# Patient Record
Sex: Male | Born: 1935 | Race: White | Hispanic: No | Smoking: Former smoker
Health system: Southern US, Community
[De-identification: ages and names within clinical notes are randomized; demographics above are authoritative.]

## PROBLEM LIST (undated history)

## (undated) DIAGNOSIS — R413 Other amnesia: Secondary | ICD-10-CM

## (undated) DIAGNOSIS — E785 Hyperlipidemia, unspecified: Secondary | ICD-10-CM

## (undated) DIAGNOSIS — F0390 Unspecified dementia without behavioral disturbance: Secondary | ICD-10-CM

## (undated) DIAGNOSIS — Z8679 Personal history of other diseases of the circulatory system: Secondary | ICD-10-CM

## (undated) DIAGNOSIS — G709 Myoneural disorder, unspecified: Secondary | ICD-10-CM

## (undated) DIAGNOSIS — I10 Essential (primary) hypertension: Secondary | ICD-10-CM

## (undated) DIAGNOSIS — H269 Unspecified cataract: Secondary | ICD-10-CM

## (undated) DIAGNOSIS — K626 Ulcer of anus and rectum: Secondary | ICD-10-CM

## (undated) DIAGNOSIS — N529 Male erectile dysfunction, unspecified: Secondary | ICD-10-CM

## (undated) DIAGNOSIS — C7951 Secondary malignant neoplasm of bone: Secondary | ICD-10-CM

## (undated) DIAGNOSIS — K529 Noninfective gastroenteritis and colitis, unspecified: Secondary | ICD-10-CM

## (undated) DIAGNOSIS — Z923 Personal history of irradiation: Secondary | ICD-10-CM

## (undated) DIAGNOSIS — I2510 Atherosclerotic heart disease of native coronary artery without angina pectoris: Secondary | ICD-10-CM

## (undated) DIAGNOSIS — K219 Gastro-esophageal reflux disease without esophagitis: Secondary | ICD-10-CM

## (undated) DIAGNOSIS — K589 Irritable bowel syndrome without diarrhea: Secondary | ICD-10-CM

## (undated) DIAGNOSIS — I219 Acute myocardial infarction, unspecified: Secondary | ICD-10-CM

## (undated) DIAGNOSIS — C61 Malignant neoplasm of prostate: Secondary | ICD-10-CM

## (undated) DIAGNOSIS — E039 Hypothyroidism, unspecified: Secondary | ICD-10-CM

## (undated) DIAGNOSIS — R52 Pain, unspecified: Secondary | ICD-10-CM

## (undated) DIAGNOSIS — F329 Major depressive disorder, single episode, unspecified: Secondary | ICD-10-CM

## (undated) DIAGNOSIS — D214 Benign neoplasm of connective and other soft tissue of abdomen: Principal | ICD-10-CM

## (undated) DIAGNOSIS — N289 Disorder of kidney and ureter, unspecified: Secondary | ICD-10-CM

## (undated) DIAGNOSIS — F32A Depression, unspecified: Secondary | ICD-10-CM

## (undated) HISTORY — DX: Benign neoplasm of connective and other soft tissue of abdomen: D21.4

## (undated) HISTORY — DX: Hyperlipidemia, unspecified: E78.5

## (undated) HISTORY — DX: Hypothyroidism, unspecified: E03.9

## (undated) HISTORY — PX: UPPER GASTROINTESTINAL ENDOSCOPY: SHX188

## (undated) HISTORY — DX: Unspecified cataract: H26.9

## (undated) HISTORY — DX: Noninfective gastroenteritis and colitis, unspecified: K52.9

## (undated) HISTORY — DX: Acute myocardial infarction, unspecified: I21.9

## (undated) HISTORY — DX: Ulcer of anus and rectum: K62.6

## (undated) HISTORY — PX: COLONOSCOPY: SHX174

## (undated) HISTORY — DX: Male erectile dysfunction, unspecified: N52.9

## (undated) HISTORY — DX: Essential (primary) hypertension: I10

## (undated) HISTORY — DX: Malignant neoplasm of prostate: C61

## (undated) HISTORY — DX: Myoneural disorder, unspecified: G70.9

## (undated) HISTORY — DX: Other amnesia: R41.3

## (undated) HISTORY — DX: Irritable bowel syndrome, unspecified: K58.9

## (undated) HISTORY — DX: Personal history of other diseases of the circulatory system: Z86.79

## (undated) HISTORY — PX: STOMACH SURGERY: SHX791

---

## 1996-07-01 DIAGNOSIS — Z8679 Personal history of other diseases of the circulatory system: Secondary | ICD-10-CM

## 1996-07-01 HISTORY — DX: Personal history of other diseases of the circulatory system: Z86.79

## 1996-07-01 HISTORY — PX: CORONARY ARTERY BYPASS GRAFT: SHX141

## 1997-09-29 ENCOUNTER — Encounter (HOSPITAL_COMMUNITY): Admission: RE | Admit: 1997-09-29 | Discharge: 1997-12-28 | Payer: Self-pay | Admitting: Cardiology

## 1997-12-30 ENCOUNTER — Encounter (HOSPITAL_COMMUNITY): Admission: RE | Admit: 1997-12-30 | Discharge: 1998-03-30 | Payer: Self-pay | Admitting: Cardiology

## 1998-03-31 ENCOUNTER — Encounter (HOSPITAL_COMMUNITY): Admission: RE | Admit: 1998-03-31 | Discharge: 1998-06-29 | Payer: Self-pay | Admitting: Cardiology

## 1998-06-30 ENCOUNTER — Encounter (HOSPITAL_COMMUNITY): Admission: RE | Admit: 1998-06-30 | Discharge: 1998-09-28 | Payer: Self-pay | Admitting: Cardiology

## 1998-09-29 ENCOUNTER — Encounter (HOSPITAL_COMMUNITY): Admission: RE | Admit: 1998-09-29 | Discharge: 1998-12-28 | Payer: Self-pay | Admitting: Cardiology

## 1998-12-29 ENCOUNTER — Encounter (HOSPITAL_COMMUNITY): Admission: RE | Admit: 1998-12-29 | Discharge: 1999-03-29 | Payer: Self-pay | Admitting: Cardiology

## 2000-03-11 ENCOUNTER — Encounter (INDEPENDENT_AMBULATORY_CARE_PROVIDER_SITE_OTHER): Payer: Self-pay | Admitting: *Deleted

## 2000-03-11 ENCOUNTER — Ambulatory Visit (HOSPITAL_BASED_OUTPATIENT_CLINIC_OR_DEPARTMENT_OTHER): Admission: RE | Admit: 2000-03-11 | Discharge: 2000-03-11 | Payer: Self-pay | Admitting: Plastic Surgery

## 2001-09-04 ENCOUNTER — Encounter: Payer: Self-pay | Admitting: Gastroenterology

## 2002-11-04 ENCOUNTER — Emergency Department (HOSPITAL_COMMUNITY): Admission: EM | Admit: 2002-11-04 | Discharge: 2002-11-04 | Payer: Self-pay | Admitting: Emergency Medicine

## 2002-11-04 ENCOUNTER — Encounter: Payer: Self-pay | Admitting: Emergency Medicine

## 2004-06-04 ENCOUNTER — Ambulatory Visit: Payer: Self-pay | Admitting: Internal Medicine

## 2004-11-07 ENCOUNTER — Ambulatory Visit: Payer: Self-pay | Admitting: Internal Medicine

## 2005-01-03 ENCOUNTER — Ambulatory Visit: Payer: Self-pay | Admitting: Internal Medicine

## 2005-01-18 ENCOUNTER — Ambulatory Visit: Payer: Self-pay | Admitting: Internal Medicine

## 2005-01-22 ENCOUNTER — Ambulatory Visit: Admission: RE | Admit: 2005-01-22 | Discharge: 2005-02-05 | Payer: Self-pay | Admitting: Radiation Oncology

## 2005-02-04 ENCOUNTER — Ambulatory Visit: Payer: Self-pay | Admitting: Internal Medicine

## 2005-02-22 ENCOUNTER — Ambulatory Visit: Payer: Self-pay | Admitting: Gastroenterology

## 2005-03-05 ENCOUNTER — Ambulatory Visit: Payer: Self-pay | Admitting: Gastroenterology

## 2005-03-05 ENCOUNTER — Ambulatory Visit: Payer: Self-pay | Admitting: Internal Medicine

## 2005-04-15 ENCOUNTER — Ambulatory Visit: Admission: RE | Admit: 2005-04-15 | Discharge: 2005-05-06 | Payer: Self-pay | Admitting: Radiation Oncology

## 2005-05-29 ENCOUNTER — Ambulatory Visit: Admission: RE | Admit: 2005-05-29 | Discharge: 2005-06-21 | Payer: Self-pay | Admitting: Radiation Oncology

## 2005-06-20 ENCOUNTER — Ambulatory Visit: Payer: Self-pay | Admitting: Internal Medicine

## 2005-06-27 ENCOUNTER — Ambulatory Visit: Payer: Self-pay | Admitting: Internal Medicine

## 2005-07-01 DIAGNOSIS — C61 Malignant neoplasm of prostate: Secondary | ICD-10-CM

## 2005-07-01 HISTORY — DX: Malignant neoplasm of prostate: C61

## 2005-07-02 ENCOUNTER — Ambulatory Visit: Admission: RE | Admit: 2005-07-02 | Discharge: 2005-09-20 | Payer: Self-pay | Admitting: Radiation Oncology

## 2005-07-02 ENCOUNTER — Emergency Department (HOSPITAL_COMMUNITY): Admission: EM | Admit: 2005-07-02 | Discharge: 2005-07-02 | Payer: Self-pay | Admitting: Emergency Medicine

## 2005-08-30 ENCOUNTER — Ambulatory Visit: Payer: Self-pay | Admitting: Internal Medicine

## 2005-09-24 ENCOUNTER — Ambulatory Visit: Payer: Self-pay | Admitting: Internal Medicine

## 2005-10-11 ENCOUNTER — Ambulatory Visit: Payer: Self-pay | Admitting: Internal Medicine

## 2005-11-29 ENCOUNTER — Ambulatory Visit: Payer: Self-pay | Admitting: Internal Medicine

## 2006-01-07 ENCOUNTER — Ambulatory Visit: Payer: Self-pay | Admitting: Internal Medicine

## 2006-02-27 ENCOUNTER — Ambulatory Visit: Payer: Self-pay | Admitting: Internal Medicine

## 2006-03-25 ENCOUNTER — Ambulatory Visit: Payer: Self-pay | Admitting: Internal Medicine

## 2006-05-15 ENCOUNTER — Ambulatory Visit: Payer: Self-pay | Admitting: Internal Medicine

## 2006-07-04 ENCOUNTER — Ambulatory Visit: Payer: Self-pay | Admitting: Internal Medicine

## 2006-09-15 ENCOUNTER — Ambulatory Visit: Payer: Self-pay | Admitting: Internal Medicine

## 2006-10-08 ENCOUNTER — Ambulatory Visit: Payer: Self-pay | Admitting: Internal Medicine

## 2006-10-08 LAB — CONVERTED CEMR LAB
AST: 18 units/L (ref 0–37)
Bilirubin, Direct: 0.1 mg/dL (ref 0.0–0.3)
Chloride: 108 meq/L (ref 96–112)
Eosinophils Absolute: 0.3 10*3/uL (ref 0.0–0.6)
Eosinophils Relative: 5.1 % — ABNORMAL HIGH (ref 0.0–5.0)
GFR calc non Af Amer: 78 mL/min
Glucose, Bld: 190 mg/dL — ABNORMAL HIGH (ref 70–99)
HCT: 35.5 % — ABNORMAL LOW (ref 39.0–52.0)
Hemoglobin: 12.7 g/dL — ABNORMAL LOW (ref 13.0–17.0)
Hgb A1c MFr Bld: 5.8 % (ref 4.6–6.0)
Lymphocytes Relative: 19.8 % (ref 12.0–46.0)
MCHC: 35.8 g/dL (ref 30.0–36.0)
MCV: 95.3 fL (ref 78.0–100.0)
Monocytes Absolute: 0.4 10*3/uL (ref 0.2–0.7)
Neutro Abs: 3.4 10*3/uL (ref 1.4–7.7)
Neutrophils Relative %: 66.2 % (ref 43.0–77.0)
Potassium: 3.6 meq/L (ref 3.5–5.1)
RBC: 3.72 M/uL — ABNORMAL LOW (ref 4.22–5.81)
Sodium: 140 meq/L (ref 135–145)
TSH: 1.86 microintl units/mL (ref 0.35–5.50)
WBC: 5.1 10*3/uL (ref 4.5–10.5)

## 2006-10-23 ENCOUNTER — Ambulatory Visit: Payer: Self-pay | Admitting: Internal Medicine

## 2006-11-11 ENCOUNTER — Ambulatory Visit: Payer: Self-pay | Admitting: Internal Medicine

## 2006-12-25 ENCOUNTER — Ambulatory Visit: Payer: Self-pay | Admitting: Internal Medicine

## 2006-12-29 ENCOUNTER — Encounter: Payer: Self-pay | Admitting: Internal Medicine

## 2006-12-29 DIAGNOSIS — K5730 Diverticulosis of large intestine without perforation or abscess without bleeding: Secondary | ICD-10-CM | POA: Insufficient documentation

## 2006-12-29 DIAGNOSIS — I10 Essential (primary) hypertension: Secondary | ICD-10-CM | POA: Insufficient documentation

## 2006-12-29 DIAGNOSIS — E0842 Diabetes mellitus due to underlying condition with diabetic polyneuropathy: Secondary | ICD-10-CM

## 2006-12-29 DIAGNOSIS — E785 Hyperlipidemia, unspecified: Secondary | ICD-10-CM | POA: Insufficient documentation

## 2006-12-29 DIAGNOSIS — Z8546 Personal history of malignant neoplasm of prostate: Secondary | ICD-10-CM

## 2006-12-29 DIAGNOSIS — I2510 Atherosclerotic heart disease of native coronary artery without angina pectoris: Secondary | ICD-10-CM | POA: Insufficient documentation

## 2007-02-02 ENCOUNTER — Encounter (INDEPENDENT_AMBULATORY_CARE_PROVIDER_SITE_OTHER): Payer: Self-pay

## 2007-02-03 ENCOUNTER — Telehealth: Payer: Self-pay | Admitting: Internal Medicine

## 2007-02-04 ENCOUNTER — Ambulatory Visit: Payer: Self-pay | Admitting: Internal Medicine

## 2007-02-04 DIAGNOSIS — D6489 Other specified anemias: Secondary | ICD-10-CM

## 2007-02-04 DIAGNOSIS — R5383 Other fatigue: Secondary | ICD-10-CM

## 2007-02-04 DIAGNOSIS — M549 Dorsalgia, unspecified: Secondary | ICD-10-CM | POA: Insufficient documentation

## 2007-02-04 DIAGNOSIS — R5381 Other malaise: Secondary | ICD-10-CM | POA: Insufficient documentation

## 2007-02-04 LAB — CONVERTED CEMR LAB
ALT: 28 units/L (ref 0–53)
AST: 24 units/L (ref 0–37)
Albumin: 3.8 g/dL (ref 3.5–5.2)
Alkaline Phosphatase: 32 units/L — ABNORMAL LOW (ref 39–117)
BUN: 17 mg/dL (ref 6–23)
Basophils Absolute: 0 10*3/uL (ref 0.0–0.1)
Chloride: 106 meq/L (ref 96–112)
Creatinine, Ser: 1.2 mg/dL (ref 0.4–1.5)
HCT: 34.3 % — ABNORMAL LOW (ref 39.0–52.0)
MCHC: 35.6 g/dL (ref 30.0–36.0)
Monocytes Relative: 8.7 % (ref 3.0–11.0)
RBC: 3.63 M/uL — ABNORMAL LOW (ref 4.22–5.81)
RDW: 13.6 % (ref 11.5–14.6)
Total Bilirubin: 0.8 mg/dL (ref 0.3–1.2)

## 2007-03-23 ENCOUNTER — Ambulatory Visit: Payer: Self-pay | Admitting: Internal Medicine

## 2007-03-24 ENCOUNTER — Encounter: Payer: Self-pay | Admitting: Internal Medicine

## 2007-04-30 ENCOUNTER — Telehealth: Payer: Self-pay | Admitting: Internal Medicine

## 2007-05-07 ENCOUNTER — Ambulatory Visit: Payer: Self-pay | Admitting: Internal Medicine

## 2007-05-22 ENCOUNTER — Ambulatory Visit: Payer: Self-pay | Admitting: Internal Medicine

## 2007-06-22 ENCOUNTER — Ambulatory Visit: Payer: Self-pay | Admitting: Internal Medicine

## 2007-07-02 DIAGNOSIS — I219 Acute myocardial infarction, unspecified: Secondary | ICD-10-CM

## 2007-07-02 HISTORY — PX: OTHER SURGICAL HISTORY: SHX169

## 2007-07-02 HISTORY — DX: Acute myocardial infarction, unspecified: I21.9

## 2007-08-14 ENCOUNTER — Inpatient Hospital Stay (HOSPITAL_COMMUNITY): Admission: EM | Admit: 2007-08-14 | Discharge: 2007-08-16 | Payer: Self-pay | Admitting: Emergency Medicine

## 2007-08-14 ENCOUNTER — Encounter: Payer: Self-pay | Admitting: Orthopedic Surgery

## 2007-08-31 ENCOUNTER — Encounter: Payer: Self-pay | Admitting: Internal Medicine

## 2007-09-03 ENCOUNTER — Encounter (HOSPITAL_COMMUNITY): Admission: RE | Admit: 2007-09-03 | Discharge: 2007-12-02 | Payer: Self-pay | Admitting: Cardiology

## 2007-09-24 ENCOUNTER — Ambulatory Visit: Payer: Self-pay | Admitting: Internal Medicine

## 2007-10-08 ENCOUNTER — Encounter: Payer: Self-pay | Admitting: Internal Medicine

## 2007-10-16 ENCOUNTER — Encounter: Payer: Self-pay | Admitting: Internal Medicine

## 2007-11-03 ENCOUNTER — Inpatient Hospital Stay (HOSPITAL_COMMUNITY): Admission: AD | Admit: 2007-11-03 | Discharge: 2007-11-04 | Payer: Self-pay | Admitting: Cardiology

## 2007-11-03 ENCOUNTER — Inpatient Hospital Stay (HOSPITAL_BASED_OUTPATIENT_CLINIC_OR_DEPARTMENT_OTHER): Admission: RE | Admit: 2007-11-03 | Discharge: 2007-11-03 | Payer: Self-pay | Admitting: Cardiology

## 2007-11-13 ENCOUNTER — Encounter: Admission: RE | Admit: 2007-11-13 | Discharge: 2007-11-13 | Payer: Self-pay | Admitting: Cardiology

## 2007-11-13 HISTORY — PX: CORONARY STENT PLACEMENT: SHX1402

## 2007-11-26 ENCOUNTER — Ambulatory Visit: Payer: Self-pay | Admitting: Internal Medicine

## 2007-11-30 ENCOUNTER — Encounter: Payer: Self-pay | Admitting: Internal Medicine

## 2007-12-02 ENCOUNTER — Encounter: Payer: Self-pay | Admitting: Internal Medicine

## 2007-12-02 ENCOUNTER — Telehealth: Payer: Self-pay | Admitting: Internal Medicine

## 2007-12-03 ENCOUNTER — Encounter (HOSPITAL_COMMUNITY): Admission: RE | Admit: 2007-12-03 | Discharge: 2008-01-30 | Payer: Self-pay | Admitting: Cardiology

## 2007-12-04 ENCOUNTER — Telehealth (INDEPENDENT_AMBULATORY_CARE_PROVIDER_SITE_OTHER): Payer: Self-pay

## 2007-12-14 DIAGNOSIS — K648 Other hemorrhoids: Secondary | ICD-10-CM | POA: Insufficient documentation

## 2007-12-15 ENCOUNTER — Ambulatory Visit: Payer: Self-pay | Admitting: Gastroenterology

## 2007-12-15 DIAGNOSIS — K219 Gastro-esophageal reflux disease without esophagitis: Secondary | ICD-10-CM | POA: Insufficient documentation

## 2007-12-15 DIAGNOSIS — D509 Iron deficiency anemia, unspecified: Secondary | ICD-10-CM | POA: Insufficient documentation

## 2007-12-16 ENCOUNTER — Encounter: Payer: Self-pay | Admitting: Internal Medicine

## 2007-12-16 LAB — CONVERTED CEMR LAB
Basophils Relative: 1.1 % — ABNORMAL HIGH (ref 0.0–1.0)
Eosinophils Absolute: 0.3 10*3/uL (ref 0.0–0.7)
Eosinophils Relative: 5.9 % — ABNORMAL HIGH (ref 0.0–5.0)
Folate: >20 ng/mL
HCT: 36.5 % — ABNORMAL LOW (ref 39.0–52.0)
MCV: 98.2 fL (ref 78.0–100.0)
Monocytes Absolute: 0.4 10*3/uL (ref 0.1–1.0)
Neutrophils Relative %: 66.9 % (ref 43.0–77.0)
RBC: 3.72 M/uL — ABNORMAL LOW (ref 4.22–5.81)
Saturation Ratios: 34.6 % (ref 20.0–50.0)
WBC: 4.7 10*3/uL (ref 4.5–10.5)

## 2007-12-25 ENCOUNTER — Encounter: Payer: Self-pay | Admitting: Internal Medicine

## 2007-12-29 ENCOUNTER — Ambulatory Visit: Payer: Self-pay | Admitting: Gastroenterology

## 2007-12-29 LAB — CONVERTED CEMR LAB
OCCULT 1: NEGATIVE
OCCULT 4: NEGATIVE

## 2008-01-01 ENCOUNTER — Encounter: Payer: Self-pay | Admitting: Internal Medicine

## 2008-01-07 ENCOUNTER — Encounter: Admission: RE | Admit: 2008-01-07 | Discharge: 2008-01-07 | Payer: Self-pay | Admitting: Surgery

## 2008-01-07 ENCOUNTER — Other Ambulatory Visit: Admission: RE | Admit: 2008-01-07 | Discharge: 2008-01-07 | Payer: Self-pay | Admitting: Diagnostic Radiology

## 2008-01-07 ENCOUNTER — Encounter (INDEPENDENT_AMBULATORY_CARE_PROVIDER_SITE_OTHER): Payer: Self-pay | Admitting: Diagnostic Radiology

## 2008-01-07 ENCOUNTER — Encounter: Payer: Self-pay | Admitting: Internal Medicine

## 2008-01-14 ENCOUNTER — Ambulatory Visit: Payer: Self-pay | Admitting: Internal Medicine

## 2008-01-14 DIAGNOSIS — R252 Cramp and spasm: Secondary | ICD-10-CM

## 2008-02-08 ENCOUNTER — Ambulatory Visit: Payer: Self-pay | Admitting: Gastroenterology

## 2008-02-26 ENCOUNTER — Ambulatory Visit: Payer: Self-pay | Admitting: Internal Medicine

## 2008-03-31 ENCOUNTER — Ambulatory Visit: Payer: Self-pay | Admitting: Internal Medicine

## 2008-05-06 ENCOUNTER — Telehealth: Payer: Self-pay | Admitting: Gastroenterology

## 2008-05-12 ENCOUNTER — Telehealth: Payer: Self-pay | Admitting: Internal Medicine

## 2008-05-12 ENCOUNTER — Telehealth: Payer: Self-pay | Admitting: Gastroenterology

## 2008-06-07 ENCOUNTER — Ambulatory Visit: Payer: Self-pay | Admitting: Internal Medicine

## 2008-06-07 LAB — CONVERTED CEMR LAB
Basophils Absolute: 0.1 10*3/uL (ref 0.0–0.1)
Basophils Relative: 0.7 % (ref 0.0–3.0)
Calcium: 9.8 mg/dL (ref 8.4–10.5)
Creatinine, Ser: 1.4 mg/dL (ref 0.4–1.5)
Eosinophils Absolute: 0.3 10*3/uL (ref 0.0–0.7)
GFR calc Af Amer: 64 mL/min
GFR calc non Af Amer: 53 mL/min
HCT: 35.8 % — ABNORMAL LOW (ref 39.0–52.0)
Hgb A1c MFr Bld: 6.5 % — ABNORMAL HIGH (ref 4.6–6.0)
MCHC: 34.2 g/dL (ref 30.0–36.0)
MCV: 93.9 fL (ref 78.0–100.0)
Monocytes Absolute: 0.5 10*3/uL (ref 0.1–1.0)
Neutro Abs: 5 10*3/uL (ref 1.4–7.7)
Neutrophils Relative %: 67 % (ref 43.0–77.0)
RBC: 3.81 M/uL — ABNORMAL LOW (ref 4.22–5.81)
TSH: 0.82 microintl units/mL (ref 0.35–5.50)
Total Bilirubin: 0.6 mg/dL (ref 0.3–1.2)

## 2008-06-10 ENCOUNTER — Telehealth: Payer: Self-pay | Admitting: Internal Medicine

## 2008-07-05 ENCOUNTER — Ambulatory Visit: Payer: Self-pay | Admitting: Internal Medicine

## 2008-08-04 ENCOUNTER — Ambulatory Visit: Payer: Self-pay | Admitting: Internal Medicine

## 2008-08-15 ENCOUNTER — Encounter: Payer: Self-pay | Admitting: Internal Medicine

## 2008-08-29 ENCOUNTER — Encounter: Admission: RE | Admit: 2008-08-29 | Discharge: 2008-08-29 | Payer: Self-pay | Admitting: Surgery

## 2008-09-08 ENCOUNTER — Encounter: Payer: Self-pay | Admitting: Internal Medicine

## 2008-10-11 ENCOUNTER — Ambulatory Visit: Payer: Self-pay | Admitting: Internal Medicine

## 2008-11-11 ENCOUNTER — Encounter: Payer: Self-pay | Admitting: Internal Medicine

## 2009-01-10 ENCOUNTER — Ambulatory Visit: Payer: Self-pay | Admitting: Internal Medicine

## 2009-01-10 DIAGNOSIS — E1149 Type 2 diabetes mellitus with other diabetic neurological complication: Secondary | ICD-10-CM

## 2009-01-10 LAB — CONVERTED CEMR LAB: Hgb A1c MFr Bld: 6.6 % — ABNORMAL HIGH (ref 4.6–6.5)

## 2009-03-01 ENCOUNTER — Encounter: Payer: Self-pay | Admitting: Internal Medicine

## 2009-04-03 ENCOUNTER — Encounter: Payer: Self-pay | Admitting: Internal Medicine

## 2009-04-13 ENCOUNTER — Ambulatory Visit: Payer: Self-pay | Admitting: Internal Medicine

## 2009-04-14 LAB — CONVERTED CEMR LAB: Hgb A1c MFr Bld: 6.1 % (ref 4.6–6.5)

## 2009-05-03 ENCOUNTER — Encounter (INDEPENDENT_AMBULATORY_CARE_PROVIDER_SITE_OTHER): Payer: Self-pay | Admitting: *Deleted

## 2009-05-17 ENCOUNTER — Telehealth: Payer: Self-pay | Admitting: Internal Medicine

## 2009-06-06 ENCOUNTER — Ambulatory Visit: Payer: Self-pay | Admitting: Internal Medicine

## 2009-06-06 ENCOUNTER — Ambulatory Visit: Payer: Self-pay | Admitting: Gastroenterology

## 2009-06-06 LAB — CONVERTED CEMR LAB
IgA: 215 mg/dL (ref 68–378)
Saturation Ratios: 21.4 % (ref 20.0–50.0)
Transferrin: 293.6 mg/dL (ref 212.0–360.0)

## 2009-06-20 ENCOUNTER — Ambulatory Visit: Payer: Self-pay | Admitting: Internal Medicine

## 2009-06-27 ENCOUNTER — Telehealth: Payer: Self-pay | Admitting: Gastroenterology

## 2009-07-04 ENCOUNTER — Ambulatory Visit: Payer: Self-pay | Admitting: Internal Medicine

## 2009-07-04 LAB — CONVERTED CEMR LAB: Blood Glucose, Fingerstick: 284

## 2009-07-13 ENCOUNTER — Ambulatory Visit: Payer: Self-pay | Admitting: Internal Medicine

## 2009-07-13 LAB — CONVERTED CEMR LAB: Blood Glucose, Fingerstick: 336

## 2009-07-17 ENCOUNTER — Telehealth: Payer: Self-pay | Admitting: Gastroenterology

## 2009-07-18 ENCOUNTER — Ambulatory Visit: Payer: Self-pay | Admitting: Gastroenterology

## 2009-07-18 DIAGNOSIS — E039 Hypothyroidism, unspecified: Secondary | ICD-10-CM

## 2009-07-19 ENCOUNTER — Telehealth: Payer: Self-pay | Admitting: Gastroenterology

## 2009-07-24 ENCOUNTER — Ambulatory Visit: Payer: Self-pay | Admitting: Gastroenterology

## 2009-07-26 ENCOUNTER — Encounter: Payer: Self-pay | Admitting: Gastroenterology

## 2009-08-07 ENCOUNTER — Encounter: Payer: Self-pay | Admitting: Internal Medicine

## 2009-08-10 ENCOUNTER — Ambulatory Visit: Payer: Self-pay | Admitting: Gastroenterology

## 2009-08-18 ENCOUNTER — Telehealth: Payer: Self-pay | Admitting: Gastroenterology

## 2009-08-22 ENCOUNTER — Telehealth: Payer: Self-pay | Admitting: Gastroenterology

## 2009-09-13 ENCOUNTER — Telehealth: Payer: Self-pay | Admitting: Gastroenterology

## 2009-09-14 ENCOUNTER — Encounter: Payer: Self-pay | Admitting: Internal Medicine

## 2009-09-18 ENCOUNTER — Ambulatory Visit: Payer: Self-pay | Admitting: Gastroenterology

## 2009-09-19 ENCOUNTER — Ambulatory Visit: Payer: Self-pay | Admitting: Internal Medicine

## 2009-09-21 ENCOUNTER — Encounter: Payer: Self-pay | Admitting: Internal Medicine

## 2009-09-25 ENCOUNTER — Telehealth: Payer: Self-pay

## 2009-09-25 ENCOUNTER — Encounter: Payer: Self-pay | Admitting: Internal Medicine

## 2009-09-27 ENCOUNTER — Telehealth: Payer: Self-pay | Admitting: Gastroenterology

## 2009-10-02 ENCOUNTER — Telehealth: Payer: Self-pay | Admitting: Gastroenterology

## 2009-10-04 ENCOUNTER — Encounter: Payer: Self-pay | Admitting: Gastroenterology

## 2009-10-17 ENCOUNTER — Ambulatory Visit: Payer: Self-pay | Admitting: Internal Medicine

## 2009-10-24 ENCOUNTER — Encounter: Payer: Self-pay | Admitting: Internal Medicine

## 2009-11-06 ENCOUNTER — Encounter: Payer: Self-pay | Admitting: Internal Medicine

## 2009-11-07 ENCOUNTER — Telehealth (INDEPENDENT_AMBULATORY_CARE_PROVIDER_SITE_OTHER): Payer: Self-pay | Admitting: *Deleted

## 2009-11-14 ENCOUNTER — Encounter: Payer: Self-pay | Admitting: Internal Medicine

## 2009-12-15 ENCOUNTER — Telehealth: Payer: Self-pay | Admitting: Gastroenterology

## 2009-12-22 ENCOUNTER — Telehealth: Payer: Self-pay | Admitting: Internal Medicine

## 2009-12-26 ENCOUNTER — Ambulatory Visit: Payer: Self-pay | Admitting: Gastroenterology

## 2009-12-26 DIAGNOSIS — K594 Anal spasm: Secondary | ICD-10-CM

## 2009-12-27 ENCOUNTER — Encounter: Payer: Self-pay | Admitting: Gastroenterology

## 2010-01-02 ENCOUNTER — Telehealth: Payer: Self-pay | Admitting: Gastroenterology

## 2010-01-18 ENCOUNTER — Ambulatory Visit: Payer: Self-pay | Admitting: Internal Medicine

## 2010-01-29 ENCOUNTER — Telehealth: Payer: Self-pay | Admitting: *Deleted

## 2010-01-30 ENCOUNTER — Telehealth: Payer: Self-pay | Admitting: Gastroenterology

## 2010-02-02 ENCOUNTER — Telehealth: Payer: Self-pay | Admitting: Gastroenterology

## 2010-02-05 ENCOUNTER — Encounter: Payer: Self-pay | Admitting: Internal Medicine

## 2010-02-16 ENCOUNTER — Ambulatory Visit: Payer: Self-pay | Admitting: Internal Medicine

## 2010-02-23 ENCOUNTER — Telehealth: Payer: Self-pay | Admitting: Internal Medicine

## 2010-03-02 ENCOUNTER — Ambulatory Visit: Payer: Self-pay | Admitting: Gastroenterology

## 2010-03-02 LAB — CONVERTED CEMR LAB
CRP, High Sensitivity: 1.44 (ref 0.00–5.00)
Folate: >20 ng/mL
Iron: 115 ug/dL (ref 42–165)
Magnesium: 2.1 mg/dL (ref 1.5–2.5)
Transferrin: 261.5 mg/dL (ref 212.0–360.0)
Vitamin B-12: 284 pg/mL (ref 211–911)

## 2010-03-22 ENCOUNTER — Ambulatory Visit: Payer: Self-pay | Admitting: Internal Medicine

## 2010-03-22 DIAGNOSIS — G3184 Mild cognitive impairment, so stated: Secondary | ICD-10-CM

## 2010-03-22 LAB — CONVERTED CEMR LAB: Hgb A1c MFr Bld: 7.2 % — ABNORMAL HIGH (ref 4.6–6.5)

## 2010-03-27 ENCOUNTER — Encounter: Payer: Self-pay | Admitting: Gastroenterology

## 2010-03-29 ENCOUNTER — Encounter: Payer: Self-pay | Admitting: Physician Assistant

## 2010-03-29 ENCOUNTER — Telehealth: Payer: Self-pay | Admitting: Gastroenterology

## 2010-04-09 ENCOUNTER — Ambulatory Visit (HOSPITAL_COMMUNITY): Admission: RE | Admit: 2010-04-09 | Discharge: 2010-04-09 | Payer: Self-pay | Admitting: Urology

## 2010-04-17 ENCOUNTER — Encounter: Payer: Self-pay | Admitting: Gastroenterology

## 2010-04-19 ENCOUNTER — Inpatient Hospital Stay (HOSPITAL_COMMUNITY): Admission: EM | Admit: 2010-04-19 | Discharge: 2010-04-20 | Payer: Self-pay | Admitting: Emergency Medicine

## 2010-04-19 ENCOUNTER — Telehealth: Payer: Self-pay | Admitting: Internal Medicine

## 2010-04-24 ENCOUNTER — Telehealth: Payer: Self-pay | Admitting: Gastroenterology

## 2010-05-01 ENCOUNTER — Ambulatory Visit: Payer: Self-pay | Admitting: Gastroenterology

## 2010-05-02 ENCOUNTER — Encounter: Payer: Self-pay | Admitting: Internal Medicine

## 2010-05-02 LAB — CONVERTED CEMR LAB
CO2: 30 meq/L (ref 19–32)
Chloride: 109 meq/L (ref 96–112)
Creatinine, Ser: 1.6 mg/dL — ABNORMAL HIGH (ref 0.4–1.5)
Potassium: 4.7 meq/L (ref 3.5–5.1)

## 2010-05-07 ENCOUNTER — Ambulatory Visit (HOSPITAL_COMMUNITY): Admission: RE | Admit: 2010-05-07 | Discharge: 2010-05-07 | Payer: Self-pay | Admitting: Gastroenterology

## 2010-05-07 ENCOUNTER — Telehealth: Payer: Self-pay | Admitting: Gastroenterology

## 2010-05-10 ENCOUNTER — Telehealth: Payer: Self-pay | Admitting: Gastroenterology

## 2010-05-15 ENCOUNTER — Ambulatory Visit: Payer: Self-pay | Admitting: Cardiology

## 2010-05-17 ENCOUNTER — Encounter: Payer: Self-pay | Admitting: Internal Medicine

## 2010-05-31 ENCOUNTER — Encounter: Payer: Self-pay | Admitting: Gastroenterology

## 2010-06-01 ENCOUNTER — Telehealth: Payer: Self-pay | Admitting: Gastroenterology

## 2010-06-21 ENCOUNTER — Ambulatory Visit: Payer: Self-pay | Admitting: Internal Medicine

## 2010-06-22 ENCOUNTER — Ambulatory Visit: Payer: Self-pay | Admitting: Internal Medicine

## 2010-07-09 ENCOUNTER — Encounter: Payer: Self-pay | Admitting: Internal Medicine

## 2010-07-20 ENCOUNTER — Telehealth: Payer: Self-pay | Admitting: Internal Medicine

## 2010-08-01 ENCOUNTER — Telehealth: Payer: Self-pay | Admitting: Gastroenterology

## 2010-08-02 NOTE — Letter (Signed)
Summary: [facility]   Imported By: Maryln Gottron 07/17/2010 11:15:57  _____________________________________________________________________  External Attachment:    Type:   Image     Comment:   External Document

## 2010-08-02 NOTE — Letter (Signed)
Summary: [facility]  [facility]   Imported By: Maryln Gottron 02/20/2010 10:29:08  _____________________________________________________________________  External Attachment:    Type:   Image     Comment:   External Document

## 2010-08-02 NOTE — Assessment & Plan Note (Signed)
Summary: 1 month rov/njr   Vital Signs:  Patient profile:   77 year old male Weight:      200 pounds Temp:     97.7 degrees F oral BP sitting:   110 / 70  (left arm) Cuff size:   regular  Vitals Entered By: Duard Brady LPN (October 17, 2009 4:32 PM)   CC: 1 mon ROV--ulcerated rectum  has appt with colorectal surg on Monday   FBS-132 Is Patient Diabetic? Yes   Primary Care Provider:  Lance Sell, MD  CC:  1 mon ROV--ulcerated rectum  has appt with colorectal surg on Monday   [id number].  History of Present Illness: 38 -year-old patient who is seen today for follow-up of his type 2 diabetes.  He has been followed by GI and general surgery due to a rectal ulcer.  His bowel habits have improved greatly on a high fiber diet.  He is been having bouts of alternating constipation and diarrhea.  He continues to have significant rectal pain and has been referred to a rectal surgeon in [place] next week.  His diabetic control has been much improved  Allergies: 1)  Codeine Phosphate (Codeine Phosphate)  Past History:  Past Medical History: Reviewed history from 04/13/2009 and no changes required. Coronary artery disease Diabetes mellitus, type II Hypertension Prostate cancer, hx of Hyperlipidemia Diverticulosis, colon essential tremor thyroid nodule and goiter mild cognitive impairment diabetic peripheral neuropathy  Physical Exam  General:  overweight-appearing.  normal blood pressure Mouth:  Oral mucosa and oropharynx without lesions or exudates.  Teeth in good repair. Neck:  No deformities, masses, or tenderness noted. Lungs:  Normal respiratory effort, chest expands symmetrically. Lungs are clear to auscultation, no crackles or wheezes. Heart:  Normal rate and regular rhythm. S1 and S2 normal without gallop, murmur, click, rub or other extra sounds.   Impression & Recommendations:  Problem # 1:  ULCER OF ANUS AND RECTUM (ICD-569.41)  Problem # 2:  DIABETES  MELLITUS, TYPE II (ICD-250.00)  His updated medication list for this problem includes:    Glimepiride 4 Mg Tabs (Glimepiride) .[name]... Take 1 tablet by mouth two times a day    Actos 15 Mg Tabs (Pioglitazone hcl) .[name]... Take 1 tablet by mouth once a day    Glipizide 5 Mg Xr24h-tab (Glipizide) ..... One daily  Orders: Venipuncture (00938) TLB-A1C / Hgb A1C (Glycohemoglobin) (83036-A1C)  Complete Medication List: 1)  Actonel 150 Mg Tabs (Risedronate sodium) .... Take 1 tablet by mouth every month 2)  Aricept 10 Mg Tabs (Donepezil hcl) .... Take 1 tablet by mouth once a day 3)  Glimepiride 4 Mg Tabs (Glimepiride) .... Take 1 tablet by mouth two times a day 4)  Lipitor 10 Mg Tabs (Atorvastatin calcium) .... Take 1/2 tablet by mouth once daily 5)  Exforge 10-320 Mg Tabs (amlodipine Besylate-valsartan)  .... Take 1 tablet by mouth once a day 6)  Actos 15 Mg Tabs (Pioglitazone hcl) .... Take 1 tablet by mouth once a day 7)  Bayer Aspirin 81 Mg Tabs (aspirin)  .... Take 1 tablet by mouth once a day 8)  Nitroquick 0.4 Mg Subl (Nitroglycerin) .... Use as needed 9)  Accu-chek Advantage Test Strp (Glucose blood) .... As directed 10)  Synthroid 50 Mcg Tabs (Levothyroxine sodium) .[name].. 1 once daily 11)  Chlorthalidone 25 Mg Tabs (Chlorthalidone) .... One half tablet daily 12)  Klor-con M20 20 Meq Cr-tabs (Potassium chloride crys cr) .... One daily 13)  Ranexa 500 Mg Xr12h-tab (Ranolazine) .Marland KitchenMarland KitchenMarland Kitchen  One tablet by mouth once daily 14)  Bentyl 10 Mg Caps (Dicyclomine hcl) .... Take 1 tab twice daily 15)  Tramadol Hcl 50 Mg Tabs (Tramadol hcl) .... As needed for pain 16)  Amitiza 8 Mcg Caps (Lubiprostone) .[name].. 1 two times a day/take with food and water 17)  Diltiazem Gel 2%  .... Apply to rectum 4-5 times daily 18)  Miralax Powd (Polyethylene glycol 3350) .... Daily 19)  Glipizide 5 Mg Xr24h-tab (Glipizide) .... One daily  Patient Instructions: 1)  Please schedule a follow-up appointment in 3 months. 2)  It is  important that you exercise regularly at least 20 minutes 5 times a week. If you develop chest pain, have severe difficulty breathing, or feel very tired , stop exercising immediately and seek medical attention. 3)  Check your blood sugars regularly. If your readings are usually above : or below 70 you should contact our office. 4)  It is important that your Diabetic A1c level is checked every 3 months.

## 2010-08-02 NOTE — Letter (Signed)
Summary: [facility] Inst[facility]  [facility]  [address] [place], [place] [address]   Phone: [phone]  Fax: [phone]       [name] [name]    Jan 19, 1948    MRN: [id number]        Procedure Day /Date:07-25-09     Arrival Time: 2:30 PM     Procedure Time: 3:30 PM     Location of Procedure:                    X     [facility] (4th Floor)   PREPARATION FOR COLONOSCOPY WITH MOVIPREP   Starting 5 days prior to your procedure 07-19-09 do not eat nuts, seeds, popcorn, corn, beans, peas,  salads, or any raw vegetables.  Do not take any fiber supplements (e.g. Metamucil, Citrucel, and Benefiber).  THE DAY BEFORE YOUR PROCEDURE         DATE: 07-23-09  DAY: Sunday  1.  Drink clear liquids the entire day-NO SOLID FOOD  2.  Do not drink anything colored red or purple.  Avoid juices with pulp.  No orange juice.  3.  Drink at least 64 oz. (8 glasses) of fluid/clear liquids during the day to prevent dehydration and help the prep work efficiently.  CLEAR LIQUIDS INCLUDE: Water Jello Ice Popsicles Tea (sugar ok, no milk/cream) Powdered fruit flavored drinks Coffee (sugar ok, no milk/cream) Gatorade Juice: apple, white grape, white cranberry  Lemonade Clear bullion, consomm, broth Carbonated beverages (any kind) Strained chicken noodle soup Hard Candy                             4.  In the morning, mix first dose of MoviPrep solution:    Empty 1 Pouch A and 1 Pouch B into the disposable container    Add lukewarm drinking water to the top line of the container. Mix to dissolve    Refrigerate (mixed solution should be used within 24 hrs)  5.  Begin drinking the prep at 5:00 p.m. The MoviPrep container is divided by 4 marks.   Every 15 minutes drink the solution down to the next mark (approximately 8 oz) until the full liter is complete.   6.  Follow completed prep with 16 oz of clear liquid of your choice (Nothing red or purple).  Continue to drink  clear liquids until bedtime.  7.  Before going to bed, mix second dose of MoviPrep solution:    Empty 1 Pouch A and 1 Pouch B into the disposable container    Add lukewarm drinking water to the top line of the container. Mix to dissolve    Refrigerate  THE DAY OF YOUR PROCEDURE      DATE:07-24-09 DAY: Monday  Beginning at 10:30 a.m. (5 hours before procedure):         1. Every 15 minutes, drink the solution down to the next mark (approx 8 oz) until the full liter is complete.  2. Follow completed prep with 16 oz. of clear liquid of your choice.    3. You may drink clear liquids until 1:30 PM (2 HOURS BEFORE PROCEDURE).   MEDICATION INSTRUCTIONS  Unless otherwise instructed, you should take regular prescription medications with a small sip of water   as early as possible the morning of your procedure.  Diabetic patients - see separate instructions.        OTHER INSTRUCTIONS  You will need  a responsible adult at least 75 years of age to accompany you and drive you home.   This person must remain in the waiting room during your procedure.  Wear loose fitting clothing that is easily removed.  Leave jewelry and other valuables at home.  However, you may wish to bring a book to read or  an iPod/MP3 player to listen to music as you wait for your procedure to start.  Remove all body piercing jewelry and leave at home.  Total time from sign-in until discharge is approximately 2-3 hours.  You should go home directly after your procedure and rest.  You can resume normal activities the  day after your procedure.  The day of your procedure you should not:   Drive   Make legal decisions   Operate machinery   Drink alcohol   Return to work  You will receive specific instructions about eating, activities and medications before you leave.    The above instructions have been reviewed and explained to me by   _______________________    I fully understand and can  verbalize these instructions _____________________________ Date _________  Appended Document: Moviprep Instructions Pt's procedure is 07-24-09.  Pt notified of this.  Made an error when typing the first page of instructions. Reprinted front page with correction to give to [facility] .

## 2010-08-02 NOTE — Progress Notes (Signed)
Summary: List of Blood Sugars taken at home  List of Blood Sugars taken at home   Imported By: Maryln Gottron 09/27/2009 15:30:50  _____________________________________________________________________  External Attachment:    Type:   Image     Comment:   External Document

## 2010-08-02 NOTE — Letter (Signed)
Summary: [name]  [facility]   Imported By: Maryln Gottron 11/22/2009 11:27:56  _____________________________________________________________________  External Attachment:    Type:   Image     Comment:   External Document

## 2010-08-02 NOTE — Letter (Signed)
Summary: [facility]  [facility]   Imported By: Maryln Gottron 09/19/2009 11:10:48  _____________________________________________________________________  External Attachment:    Type:   Image     Comment:   External Document

## 2010-08-02 NOTE — Assessment & Plan Note (Signed)
Summary: 2 month rov/njr   Vital Signs:  Patient profile:   77 year old male Weight:      194 pounds Temp:     97.8 degrees F oral BP sitting:   120 / 80  (right arm) Cuff size:   regular  Vitals Entered By: Duard Brady LPN (March 22, 2010 4:39 PM) CC: 2 mos rov - doing ok - 'herniated rectum - seeing Dr. Jarold Motto , insomnia       [phone] Is Patient Diabetic? Yes   Primary Care Berthel Bagnall:  Eleonore Chiquito, MD   CC:  2 mos rov - doing ok - 'herniated rectum - seeing Dr. Jarold Motto  and insomnia       [phone].  History of Present Illness: 77 year old patient who is seen today for follow for his type 2 diabetes.  This has been quite stable.  He continues to have the rectal pain, but at the present time.  This seems somewhat improved.  He continues to have some occasional constipated stools that cause  rectal irritation.   he is accompanied by two adult children who are concerned about the somewhat worsening of his short-term memory.  He still works actively as an Pensions consultant, and there are some concerns about his competence.  Has a diagnosis of mild cognitive impairment and has been on Aricept for over 10 years. He is followed by neurology on an annual basis.  He has carried this diagnosis since approximately 1998 at the time of coronary artery bypass surgery.  He also has a history of treated hypertension and dyslipidemia  Allergies: 1)  Codeine Phosphate (Codeine Phosphate)  Past History:  Past Medical History: Reviewed history from 03/02/2010 and no changes required. essential tremor thyroid nodule and goiter mild cognitive impairment  ANAL SPASM (ICD-564.6) ULCER OF ANUS AND RECTUM (ICD-569.41) HYPOTHYROIDISM (ICD-244.9) DIABETIC PERIPHERAL NEUROPATHY (ICD-250.60) LEG CRAMPS, NOCTURNAL (ICD-729.82) GERD (ICD-530.81) IBS (ICD-564.1) IRON DEFICIENCY (ICD-280.9) INTERNAL HEMORRHOIDS (ICD-455.0) BACK PAIN (ICD-724.5) ANEMIA NEC (ICD-285.8) SYMPTOM, MALAISE AND  FATIGUE NEC (ICD-780.79) BACK PAIN (ICD-724.5) DIVERTICULOSIS, COLON (ICD-562.10) HYPERLIPIDEMIA (ICD-272.4) PROSTATE CANCER, HX OF (ICD-V10.46) HYPERTENSION (ICD-401.9) DIABETES MELLITUS, TYPE II (ICD-250.00) CORONARY ARTERY DISEASE (ICD-414.00)  Review of Systems  The patient denies anorexia, fever, weight loss, weight gain, vision loss, decreased hearing, hoarseness, chest pain, syncope, dyspnea on exertion, peripheral edema, prolonged cough, headaches, hemoptysis, abdominal pain, melena, hematochezia, severe indigestion/heartburn, hematuria, incontinence, genital sores, muscle weakness, suspicious skin lesions, transient blindness, difficulty walking, depression, unusual weight change, abnormal bleeding, enlarged lymph nodes, angioedema, breast masses, and testicular masses.         he describes some insomnia issues, and some occasional vivid dreams  Physical Exam  General:  overweight-appearing.  normal blood pressure Head:  Normocephalic and atraumatic without obvious abnormalities. No apparent alopecia or balding. Eyes:  No corneal or conjunctival inflammation noted. EOMI. Perrla. Funduscopic exam benign, without hemorrhages, exudates or papilledema. Vision grossly normal. Mouth:  Oral mucosa and oropharynx without lesions or exudates.  Teeth in good repair. Neck:  No deformities, masses, or tenderness noted. Lungs:  Normal respiratory effort, chest expands symmetrically. Lungs are clear to auscultation, no crackles or wheezes. Heart:  Normal rate and regular rhythm. S1 and S2 normal without gallop, murmur, click, rub or other extra sounds. Abdomen:  Bowel sounds positive,abdomen soft and non-tender without masses, organomegaly or hernias noted. Msk:  No deformity or scoliosis noted of thoracic or lumbar spine.   Extremities:  No clubbing, cyanosis, edema, or deformity noted with normal full range of motion  of all joints.   Neurologic:  alert & oriented X3, cranial nerves II-XII  intact, sensation intact to light touch, and gait normal.     Impression & Recommendations:  Problem # 1:  MILD COGNITIVE IMPAIRMENT SO STATED (ICD-331.83)  Orders: Neurology Referral (Neuro) the patient is requesting a early follow-up with Dr. love.  This will be scheduled.  I do not feel the patient is a proper candidate for Namenda.  Will probably require more extensive psychiatric and cognitive evaluation to determine competence  Problem # 2:  ANAL SPASM (ICD-564.6)  Problem # 3:  HYPERTENSION (ICD-401.9)  Problem # 4:  DIABETES MELLITUS, TYPE II (ICD-250.00)  His updated medication list for this problem includes:    Glimepiride 4 Mg Tabs (Glimepiride) .[name]... Take 1 tablet by mouth every am    Metformin Hcl 1000 Mg Tabs (Metformin hcl) ..... One tablet by mouth once daily  Orders: Venipuncture (16109) Specimen Handling (60454) TLB-A1C / Hgb A1C (Glycohemoglobin) (83036-A1C)  Complete Medication List: 1)  Aricept 10 Mg Tabs (Donepezil hcl) .... Take 1 tablet by mouth once a day 2)  Glimepiride 4 Mg Tabs (Glimepiride) .... Take 1 tablet by mouth every am 3)  Lipitor 10 Mg Tabs (Atorvastatin calcium) .... Take 1/2 tablet by mouth once daily 4)  Exforge 10-320 Mg Tabs (amlodipine Besylate-valsartan)  .... Take 1 tablet by mouth once a day 5)  Bayer Aspirin 81 Mg Tabs (aspirin)  .... Take 1 tablet by mouth once a day 6)  Nitroquick 0.4 Mg Subl (Nitroglycerin) .... Use as needed 7)  Accu-chek Advantage Test Strp (Glucose blood) .... As directed 8)  Synthroid 50 Mcg Tabs (Levothyroxine sodium) .[name].. 1 once daily 9)  Tramadol Hcl 50 Mg Tabs (Tramadol hcl) .[name].. 1 by mouth q 6-8 hrs as needed pain 10)  Zenpep 20000 Unit Cpep (Pancrelipase (lip-prot-amyl)) .... 2 tabs three times a day before meals 11)  Boniva 150 Mg Tabs (Ibandronate sodium) .... One tablet by mouth once a month 12)  Metformin Hcl 1000 Mg Tabs (Metformin hcl) .... One tablet by mouth once daily 13)  Metamucil 30.9 %  Powd (Psyllium) .... Once daily 14)  Vitamin D3 1000 Unit Tabs (Cholecalciferol) .... One tablet by mouth once daily 15)  Imodium A-d 2 Mg Tabs (Loperamide hcl) .... 1/2 tablet by mouth as needed 16)  Stool Softener 250 Mg Caps (Docusate sodium) .... Six capsules by mouth once daily 17)  Lidocaine Hcl 2 % Gel (Lidocaine hcl) .... Apply to rectum 3-4 x daily as needed 18)  Co-enzyme Q-10 60 Mg Caps (Coenzyme q10) .... Daily  Patient Instructions: 1)  Please schedule a follow-up appointment in 3 months. 2)  Limit your Sodium (Salt) to less than 2 grams a day(slightly less than 1/2 a teaspoon) to prevent fluid retention, swelling, or worsening of symptoms. 3)  Check your blood sugars regularly. If your readings are usually above : or below 70 you should contact our office. 4)  It is important that your Diabetic A1c level is checked every 3 months. 5)  See your eye doctor yearly to check for diabetic eye damage.   Immunization History:  Influenza Immunization History:    Influenza:  Historical (03/01/2010) done at work. [employer]

## 2010-08-02 NOTE — Progress Notes (Signed)
Summary: Ulcerative Colon  Medications Added TRAMADOL HCL 50 MG TABS (TRAMADOL HCL) 1 by mouth q 6-8 hrs as needed pain       Phone Note Call from Patient Call back at Home Phone [phone]   Call For: Dr Jarold Motto Summary of Call: Has an ulcerative rectum and is having alot of trouble. Initial call taken by: Leanor Kail [facility],  December 15, 2009 9:25 AM  Follow-up for Phone Call        Lm for pt to call.   Ashok Cordia RN  December 15, 2009 9:47 AM`  Talked with pt.  Pt has been to Careers adviser and to [facility] for Biofeedback.  Continues to have severe rectal pain.  Worse at night. Pt is requesting a refill on Tramaldol, only has a few tabs left and he is going on vacation next week. Pt asks if there is any thing else that Dr. Jarold Motto can suggest.   Follow-up by: Ashok Cordia RN,  December 15, 2009 10:49 AM    Additional Follow-up for Phone Call Additional follow up Details #2::    ok.... Follow-up by: Mardella Layman MD FACG,  December 18, 2009 9:14 AM  Additional Follow-up for Phone Call Additional follow up Details #3:: Details for Additional Follow-up Action Taken: Pt on vacation 7/20-7/24.   Additional Follow-up by: Ashok Cordia RN,  December 19, 2009 4:43 PM  New/Updated Medications: TRAMADOL HCL 50 MG TABS (TRAMADOL HCL) 1 by mouth q 6-8 hrs as needed pain Prescriptions: TRAMADOL HCL 50 MG TABS (TRAMADOL HCL) 1 by mouth q 6-8 hrs as needed pain  #60 x 0   Entered by:   Ashok Cordia RN   Authorized by:   Mardella Layman MD [facility]   Signed by:   Ashok Cordia RN on 12/15/2009   Method used:   Electronically to        [facility] [address] [id number] (retail)       [address] [place], [place]  [address]       Ph: [phone]       Fax: [phone]   RxID:   [id number]

## 2010-08-02 NOTE — Assessment & Plan Note (Signed)
Summary: COL F/U.Marland KitchenMarland KitchenAS.    History of Present Illness Visit Type: Initial Visit Primary GI MD: Sheryn Bison MD FACP FAGA Primary Provider: Lance Sell, MD Requesting Provider: Roger Shelter, MD Chief Complaint: Patient here for f/u after colonoscopy. He states that he still feels "terrible" and that he continues to have severe rectal pain that often radiateds to his penis. He also c/o occasional lower abdominal discomfort. He states that he had his first stool after colonoscopy on Monday and that he is very constipated. History of Present Illness:   Mr. Gulley had colonoscopy which revealed severe diverticulosis but no acute diverticulitis. 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 He had improvement with Canasa 1 g suppositories twice a day but has run out of this medication. He complains of rather severe constipation and previously had complained of diarrhea. He has abdominal gas, bloating, and chronic alternating diarrhea and constipation consistent with IBS. He currently is on bentyl 10 mg twice a day,p.r.n. tramadol 50 mg, Synthroid, Aricept, and his diabetic medications. Review of his records and labs was extensive and performed today. He does have a history of previous mild iron deficiency anemia which has resolved with oral iron replacement. B12 and folate levels were normal. Previous attempts to treat him for presumed bacterial overgrowth syndrome it had mixed results. He has no evidence of chronic malabsorption or malnutrition. 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   GI Review of Systems    Reports abdominal pain and  heartburn.     Location of  Abdominal pain: lower abdomen.    Denies acid reflux, belching, bloating, chest pain, dysphagia with liquids, dysphagia with solids, loss of appetite, nausea, vomiting, vomiting blood, weight loss, and  weight gain.      Reports constipation  and  rectal pain.     Denies anal fissure, black tarry stools, change in bowel habit, diarrhea, diverticulosis, fecal incontinence, heme positive stool, hemorrhoids, irritable bowel syndrome, jaundice, light color stool, liver problems, and  rectal bleeding.    Current Medications (verified): 1)  Actonel 150 Mg  Tabs (Risedronate Sodium) .... Take 1 Tablet By Mouth Every Month 2)  Aricept 10 Mg Tabs (Donepezil Hcl) .... Take 1 Tablet By Mouth Once A Day 3)  Glimepiride 4 Mg Tabs (Glimepiride) .... Take 1 Tablet By Mouth Two Times A Day 4)  Lipitor 10 Mg Tabs (Atorvastatin Calcium) .... Take 1/2 Tablet By Mouth Once Daily 5)  Exforge 10-320 Mg  Tabs (Amlodipine Besylate-Valsartan) .... Take 1 Tablet By Mouth Once A Day 6)  Actos 15 Mg  Tabs (Pioglitazone Hcl) .... Take 1 Tablet By Mouth Once A Day 7)  Bayer Aspirin 81 Mg  Tabs (Aspirin) .... Take 1 Tablet By Mouth Once A Day 8)  Nitroquick 0.4 Mg  Subl (Nitroglycerin) .... Use As Needed 9)  Metformin Hcl 1000 Mg  Tabs (Metformin Hcl) .... Take 1 Tab By Mouth Two Times A Day 10)  Freestyle Lite Test   Strp (Glucose Blood) .... Use Daily 11)  Synthroid 50 Mcg Tabs (Levothyroxine Sodium) .[name].. 1 Once Daily 12)  Chlorthalidone 25 Mg Tabs (Chlorthalidone) .... One Half Tablet Daily 13)  Klor-Con M20 20 Meq Cr-Tabs (Potassium Chloride Crys Cr) .... One Daily 14)  Ranexa 500 Mg Xr12h-Tab (Ranolazine) .... One Tablet By Mouth Once Daily 15)  Bentyl 10 Mg Caps (Dicyclomine Hcl) .... Take 1 Tab Twice Daily 16)  Tramadol Hcl 50 Mg Tabs (  Tramadol Hcl) .... As Needed For Pain  Allergies (verified): 1)  Codeine Phosphate (Codeine Phosphate)  Past History:  Past medical, surgical, family and social histories (including risk factors) reviewed for relevance to current acute and chronic problems.  Past Medical History: Reviewed history from 04/13/2009 and no changes required. Coronary artery disease Diabetes mellitus, type II Hypertension Prostate  cancer, hx of Hyperlipidemia Diverticulosis, colon essential tremor thyroid nodule and goiter mild cognitive impairment diabetic peripheral neuropathy  Past Surgical History: Reviewed history from 02/08/2008 and no changes required. Coronary artery bypass graft-1988 PTCA with stent circumflex artery, February 13th 2009 colonoscopy March 2003 prostate radiation treatments-2005  Family History: Reviewed history from 02/08/2008 and no changes required. father died age 42, complications  of a stroke and pneumonia mother died age 31  No siblings No FH of Colon Cancer:  Social History: Reviewed history from 12/15/2007 and no changes required. Married Patient has never smoked.  Alcohol Use - no Daily Caffeine Use Illicit Drug Use - no Patient gets regular exercise.  Vital Signs:  Patient profile:   77 year old male Height:      67 inches Weight:      203.38 pounds BMI:     31.97 BSA:     2.04 Pulse rate:   56 / minute Pulse rhythm:   regular BP sitting:   100 / 54  (right arm)  Vitals Entered By: Hortense Ramal CMA Duncan Dull) (August 10, 2009 11:49 AM)  Physical Exam  General:  Well developed, well nourished, no acute distress.healthy appearing.   Head:  Normocephalic and atraumatic. Eyes:  PERRLA, no icterus.exam deferred to patient's ophthalmologist.   Psych:  Alert and cooperative. Normal mood and affect.   Impression & Recommendations:  Problem # 1:  ULCER OF ANUS AND RECTUM (ICD-569.41) Assessment Improved This is a benign condition associated with mucosal prolapse and chronic constipation. He is to continue his Canasa 1 g suppositories twice a day, try Amitiza 8 micrograms twice a day with meals, MiraLax 8 ounces at bedtime, and we will see him again in one month's time.  Problem # 2:  HYPOTHYROIDISM (ICD-244.9) Assessment: Improved Review of recent extensive labs was otherwise unremarkable, and he's been asked to continue his multiple other medications as per  Dr. Kirtland Bouchard.  Problem # 3:  DIVERTICULOSIS, COLON (ICD-562.10) Assessment: Unchanged Continue high fiber diet as tolerated with liberal p.o. fluids.  Problem # 4:  DIABETES MELLITUS, TYPE II (ICD-250.00) Assessment: Improved Continue  multiple medications per primary care along with potassium replacement.  Problem # 5:  IRON DEFICIENCY (ICD-280.9) Assessment: Improved Labs show normal CBC and iron levels and is not on oral iron replacement therapy at this time.We will follow his CBCs periodically along with his other lab data as needed.  Problem # 6:  PROSTATE CANCER, HX OF (ICD-V10.46) Assessment: Comment Only Standard urology followup as per protocol.  Problem # 7:  HYPERTENSION (ICD-401.9) Assessment: Improved Blood Pressure today is 100/54 and pulse is 56 and regular. He is followed by Dr. Roger Shelter in cardiology. I have asked him to continue all of his cardiac medications as outlined and reviewed in his record.  Patient Instructions: 1)  Copy sent to : Dr. Beverely Low and Dr. Roger Shelter [facility] 2)  Please continue current medications. 3)  Canasa 1 g suppositories twice a day, Amitiza 8 micrograms twice a day, MiraLax as tolerated 8 ounces at bedtime  4)  Please schedule a follow-up appointment in 1 month.  5)  Diet should be high in fiber ( fruits, vegetables, whole grains) but low in residue. Drink at least eight (8) glasses of water a day.  6)  The medication list was reviewed and reconciled.  All changed / newly prescribed medications were explained.  A complete medication list was provided to the patient / caregiver. Prescriptions: AMITIZA 8 MCG  CAPS (LUBIPROSTONE) 1 two times a day/take with food and water  #60 x 6   Entered by:   Ashok Cordia RN   Authorized by:   Mardella Layman MD [facility]   Signed by:   Ashok Cordia RN on 08/10/2009   Method used:   Electronically to        [facility] [id number]* (retail)       [address] [place], [place]  [address]       Ph: [phone]       Fax: [phone]   RxID:   [id number] CANASA 1000 MG  SUPP (MESALAMINE) 1 per rectum bid  #30 x 3   Entered by:   Ashok Cordia RN   Authorized by:   Mardella Layman MD [facility]   Signed by:   Ashok Cordia RN on 08/10/2009   Method used:   Electronically to        [facility] [address] #[id number]* (retail)       [address] [place], [place]  [address]       Ph: [phone]       Fax: [phone]   RxID:   [id number]

## 2010-08-02 NOTE — Progress Notes (Signed)
Summary: Appt at [facility]  Phone Note Call from Patient Call back at Home Phone [phone]   Call For: Dr Jarold Motto Reason for Call: Talk to Nurse Summary of Call: Has an appt at [facility] and is wondering if we referred him because he doesnt know where to go. Initial call taken by: Leanor Kail [facility],  May 07, 2010 9:25 AM  Follow-up for Phone Call        Renae Fickle took the information about the MRI for today.  Follow-up by: Harlow Mares CMA Duncan Dull),  May 07, 2010 10:38 AM

## 2010-08-02 NOTE — Consult Note (Signed)
Summary: [facility]   Imported By: Sherian Rein 10/27/2009 09:38:01  _____________________________________________________________________  External Attachment:    Type:   Image     Comment:   External Document

## 2010-08-02 NOTE — Progress Notes (Signed)
Summary: med ?'s   Phone Note Call from Patient Call back at Home Phone [phone]   Caller: Patient Call For: Dr. Jarold Motto Reason for Call: Talk to Nurse Summary of Call: pt said he recently filled rx for pain meds from Dr. Jarold Motto, but the boxes the drug came in has no instructions on it as to how much and when to take the meds... pt says pharmacist did not go over the medication with him when he picked it up... didnt recall the drug name, but said it was rx'ed for pain Initial call taken by: Vallarie Mare,  July 19, 2009 12:38 PM  Follow-up for Phone Call        Discussed Rx sent at [facility] yesterday with pt.  Pt asks if still needs to use suppository given by PCP if using cream.  Pt infirmed does nto need both.  Both are hydrocortisone.  Cream contains lidocaine. Follow-up by: Ashok Cordia RN,  July 19, 2009 1:08 PM

## 2010-08-02 NOTE — Letter (Signed)
Summary: Eye Exam/[facility]  Eye Exam/[facility]   Imported By: Maryln Gottron 07/16/2010 10:58:29  _____________________________________________________________________  External Attachment:    Type:   Image     Comment:   External Document

## 2010-08-02 NOTE — Miscellaneous (Signed)
  Clinical Lists Changes  Medications: Added new medication of CANASA 1000 MG  SUPP (MESALAMINE) rectal suppository every hs - Signed Rx of CANASA 1000 MG  SUPP (MESALAMINE) rectal suppository every hs;  #30 x 6;  Signed;  Entered by: Weston Brass;  Authorized by: Mardella Layman MD [facility];  Method used: Electronically to [facility] [id number]*, [address], [place], [place]  [address], Ph: [phone], Fax: [phone] Observations: Added new observation of ALLERGY REV: Done (07/24/2009 16:22)    Prescriptions: CANASA 1000 MG  SUPP (MESALAMINE) rectal suppository every hs  #30 x 6   Entered by:   Weston Brass   Authorized by:   Mardella Layman MD [facility]   Signed by:   Weston Brass on 07/24/2009   Method used:   Electronically to        [facility] [address] #[id number]* (retail)       [address] [place], [place]  [address]       Ph: [phone]       Fax: [phone]   RxID:   [id number]

## 2010-08-02 NOTE — Miscellaneous (Signed)
  Clinical Lists Changes  Medications: Added new medication of TRAMADOL HCL 50 MG TABS (TRAMADOL HCL) as needed for pain - Signed Rx of TRAMADOL HCL 50 MG TABS (TRAMADOL HCL) as needed for pain;  #60 x 6;  Signed;  Entered by: Weston Brass;  Authorized by: Mardella Layman MD [facility];  Method used: Electronically to [facility] [address] [id number]*, [address], [place], [place]  [address], Ph: [phone], Fax: [phone]    Prescriptions: TRAMADOL HCL 50 MG TABS (TRAMADOL HCL) as needed for pain  #60 x 6   Entered by:   Weston Brass   Authorized by:   Mardella Layman MD [facility]   Signed by:   Weston Brass on 07/24/2009   Method used:   Electronically to        [facility] [address] [id number]* (retail)       [address] [place], [place]  [address]       Ph: [phone]       Fax: [phone]   RxID:   [id number]   Appended Document:  verified instructions to pharmacist at [facility] Colonoscopy report directions are 1 every 6 hours as needed

## 2010-08-02 NOTE — Progress Notes (Signed)
Summary: refill glimepiride  Phone Note Refill Request Message from:  Fax from Pharmacy on January 29, 2010 10:41 AM  Refills Requested: Medication #1:  GLIMEPIRIDE 4 MG TABS Take 1 tablet by mouth every am   Last Refilled: 11/01/2009 [facility] [address]   Method Requested: Fax to Local Pharmacy Initial call taken by: Duard Brady LPN,  January 29, 2010 10:41 AM    Prescriptions: GLIMEPIRIDE 4 MG TABS (GLIMEPIRIDE) Take 1 tablet by mouth every am  #90 x 2   Entered by:   Duard Brady LPN   Authorized by:   Gordy Savers  MD   Signed by:   Duard Brady LPN on 40/98/1191   Method used:   Faxed to ...       [facility] [address] #[id number]* (retail)       [address] [place], [place]  [address]       Ph: [phone]       Fax: [phone]   RxID:   [id number]
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
## 2010-08-02 NOTE — Letter (Signed)
Summary: Fax Regarding Anorectal Manomethy / Patient  Fax Regarding Anorectal Manomethy / Patient   Imported ByLennie Odor 06/06/2010 16:15:50  _____________________________________________________________________  External Attachment:    Type:   Image     Comment:   External Document

## 2010-08-02 NOTE — Letter (Signed)
Summary: Diabetic Instructions  [facility]  [address] [place], [place] [address]   Phone: [phone]  Fax: [phone]    BURLON CENTRELLA 29-Dec-1929 MRN: [id number]   X  ORAL DIABETIC MEDICATION INSTRUCTIONS  The day before your procedure:   Take your diabetic pill as you do normally  The day of your procedure:   Do not take your diabetic pill    We will check your blood sugar levels during the admission process and again in Recovery before discharging you home  ________________________________________________________________________  _  _   INSULIN (LONG ACTING) MEDICATION INSTRUCTIONS (Lantus, NPH, 70/30, Humulin, Novolin-N)   The day before your procedure:   Take  your regular evening dose    The day of your procedure:   Do not take your morning dose    _  _   INSULIN (SHORT ACTING) MEDICATION INSTRUCTIONS (Regular, Humulog, Novolog)   The day before your procedure:   Do not take your evening dose   The day of your procedure:   Do not take your morning dose   _  _   INSULIN PUMP MEDICATION INSTRUCTIONS  We will contact the physician managing your diabetic care for written dosage instructions for the day before your procedure and the day of your procedure.  Once we have received the instructions, we will contact you.

## 2010-08-02 NOTE — Letter (Signed)
Summary: [facility]   Imported By: Maryln Gottron 11/16/2009 11:17:08  _____________________________________________________________________  External Attachment:    Type:   Image     Comment:   External Document

## 2010-08-02 NOTE — Assessment & Plan Note (Signed)
Summary: 1 MO F/U    History of Present Illness Visit Type: Follow-up Visit Primary GI MD: Sheryn Bison MD FACP FAGA Primary Provider: Lance Sell, MD Requesting Provider: Roger Shelter, MD Chief Complaint: Patient states that he has rectal pain is frequent, pain also in penis at the same time; stool very unpleasant in odor; some abdominal pain History of Present Illness:   This patient continues with rectal pain of a spasmodic nature radiating into his penis refractory to all treatment including twice a day Canasa 1 g suppositories. He continues to have severe constipation despite Amitiza 8 mcg twice a day. He denies abdominal pain or systemic complaints is able to work daily. Ts   GI Review of Systems    Reports abdominal pain.     Location of  Abdominal pain: lower abdomen.    Denies acid reflux, belching, bloating, chest pain, dysphagia with liquids, dysphagia with solids, heartburn, loss of appetite, nausea, vomiting, vomiting blood, weight loss, and  weight gain.      Reports black tarry stools, constipation, fecal incontinence, and  rectal pain.     Denies anal fissure, change in bowel habit, diarrhea, diverticulosis, heme positive stool, hemorrhoids, irritable bowel syndrome, jaundice, light color stool, liver problems, and  rectal bleeding.    Current Medications (verified): 1)  Actonel 150 Mg  Tabs (Risedronate Sodium) .... Take 1 Tablet By Mouth Every Month 2)  Aricept 10 Mg Tabs (Donepezil Hcl) .... Take 1 Tablet By Mouth Once A Day 3)  Glimepiride 4 Mg Tabs (Glimepiride) .... Take 1 Tablet By Mouth Two Times A Day 4)  Lipitor 10 Mg Tabs (Atorvastatin Calcium) .... Take 1/2 Tablet By Mouth Once Daily 5)  Exforge 10-320 Mg  Tabs (Amlodipine Besylate-Valsartan) .... Take 1 Tablet By Mouth Once A Day 6)  Actos 15 Mg  Tabs (Pioglitazone Hcl) .... Take 1 Tablet By Mouth Once A Day 7)  Bayer Aspirin 81 Mg  Tabs (Aspirin) .... Take 1 Tablet By Mouth Once A Day 8)   Nitroquick 0.4 Mg  Subl (Nitroglycerin) .... Use As Needed 9)  Metformin Hcl 1000 Mg  Tabs (Metformin Hcl) .... Take 1 Tab By Mouth Two Times A Day 10)  Accu-Chek Advantage Test  Strp (Glucose Blood) .... As Directed 11)  Synthroid 50 Mcg Tabs (Levothyroxine Sodium) .[name].. 1 Once Daily 12)  Chlorthalidone 25 Mg Tabs (Chlorthalidone) .... One Half Tablet Daily 13)  Klor-Con M20 20 Meq Cr-Tabs (Potassium Chloride Crys Cr) .... One Daily 14)  Ranexa 500 Mg Xr12h-Tab (Ranolazine) .... One Tablet By Mouth Once Daily 15)  Bentyl 10 Mg Caps (Dicyclomine Hcl) .... Take 1 Tab Twice Daily 16)  Tramadol Hcl 50 Mg Tabs (Tramadol Hcl) .... As Needed For Pain 17)  Amitiza 8 Mcg  Caps (Lubiprostone) .[name].. 1 Two Times A Day/take With Food and Water  Allergies (verified): 1)  Codeine Phosphate (Codeine Phosphate)  Past History:  Past medical, surgical, family and social histories (including risk factors) reviewed for relevance to current acute and chronic problems.  Past Medical History: Reviewed history from 04/13/2009 and no changes required. Coronary artery disease Diabetes mellitus, type II Hypertension Prostate cancer, hx of Hyperlipidemia Diverticulosis, colon essential tremor thyroid nodule and goiter mild cognitive impairment diabetic peripheral neuropathy  Past Surgical History: Reviewed history from 02/08/2008 and no changes required. Coronary artery bypass graft-1988 PTCA with stent circumflex artery, February 13th 2009 colonoscopy March 2003 prostate radiation treatments-2005  Family History: Reviewed history from 02/08/2008 and no changes required. father  died age 24, complications  of a stroke and pneumonia mother died age 65  No siblings No FH of Colon Cancer:  Social History: Reviewed history from 12/15/2007 and no changes required. Married Patient has never smoked.  Alcohol Use - no Daily Caffeine Use Illicit Drug Use - no Patient gets regular exercise.  Review of  Systems       The patient complains of arthritis/joint pain, cough, fatigue, hearing problems, itching, muscle pains/cramps, shortness of breath, sleeping problems, urination - excessive, and urine leakage.  The patient denies allergy/sinus, anemia, anxiety-new, back pain, blood in urine, breast changes/lumps, change in vision, confusion, coughing up blood, depression-new, fainting, fever, headaches-new, heart murmur, heart rhythm changes, menstrual pain, night sweats, nosebleeds, pregnancy symptoms, skin rash, sore throat, swelling of feet/legs, swollen lymph glands, thirst - excessive , urination - excessive , urination changes/pain, vision changes, and voice change.    Vital Signs:  Patient profile:   77 year old male Height:      67 inches Weight:      202 pounds BMI:     31.75 Pulse rate:   80 / minute Pulse rhythm:   regular BP sitting:   130 / 68  (left arm) Cuff size:   regular  Vitals Entered By: June McMurray CMA Duncan Dull) (September 18, 2009 4:09 PM)  Physical Exam  General:  Well developed, well nourished, no acute distress. Head:  Normocephalic and atraumatic. Eyes:  PERRLA, no icterus.exam deferred to patient's ophthalmologist.   Abdomen:  Soft, nontender and nondistended. No masses, hepatosplenomegaly or hernias noted. Normal bowel sounds.obese.   Rectal:  Normal exam.hemocult negative.   Psych:  Alert and cooperative. Normal mood and affect.   Impression & Recommendations:  Problem # 1:  ULCER OF ANUS AND RECTUM (ICD-569.41) Assessment Unchanged Refractory solitary rectal ulcer syndrome with associated severe constipation. I placed him on MiraLax at bedtime, Colace on a milligrams twice a day, and local diltiazem gel to his rectum 4-5 times a day. He does not have symptomatic improvement we'll repeat his flexible sigmoidoscopy exam.  Problem # 2:  DIABETIC PERIPHERAL NEUROPATHY (ICD-250.60) Assessment: Unchanged  Problem # 3:  DIVERTICULOSIS, COLON  (ICD-562.10) Assessment: Comment Only  Patient Instructions: 1)  Miralax two times a day. 2)  Diltiazem gel apply 4-5 times a day. 3)  Report back in one week. 4)  The medication list was reviewed and reconciled.  All changed / newly prescribed medications were explained.  A complete medication list was provided to the patient / caregiver. 5)  Please call our GI Office back in 2 weeks with a follow-up of symptoms. 6)  Stop Amitiza and Canasa RX  Appended Document: 1 MO F/U    Clinical Lists Changes  Medications: Added new medication of * DILTIAZEM GEL 2% apply to rectum 4-5 times daily - Signed Added new medication of MIRALAX   POWD (POLYETHYLENE GLYCOL 3350) daily Rx of DILTIAZEM GEL 2% apply to rectum 4-5 times daily;  #15 gm x 1;  Signed;  Entered by: Ashok Cordia RN;  Authorized by: Mardella Layman MD [facility];  Method used: Printed then faxed to [facility]*, [address] [place], [place]  [id number], Ph: [phone], Fax: [phone]    Prescriptions: DILTIAZEM GEL 2% apply to rectum 4-5 times daily  #15 gm x 1   Entered by:   Ashok Cordia RN   Authorized by:   Mardella Layman MD [facility]   Signed by:   Ashok Cordia RN on  09/18/2009   Method used:   Printed then faxed to ...       [facility]* (retail)       [address]       [place], [place]  [id number]       Ph: [phone]       Fax: [phone]   RxID:   [id number]

## 2010-08-02 NOTE — Progress Notes (Signed)
Summary: Change Pain meds.   Phone Note Outgoing Call Call back at [facility] Phone [phone]   Call placed by: Harlow Mares CMA Duncan Dull),  June 01, 2010 2:12 PM Call placed to: Patient Summary of Call: Per Dr. Jarold Motto, pt should change from Hydrocodone to Nucynta 100mg  by mouth two times a day as needed. Pt aware rx signed and at the front desk to pick up. Initial call taken by: Harlow Mares CMA (AAMA),  June 01, 2010 2:13 PM    New/Updated Medications: NUCYNTA 100 MG TABS (TAPENTADOL HCL) take one by mouth two times a day as needed Prescriptions: NUCYNTA 100 MG TABS (TAPENTADOL HCL) take one by mouth two times a day as needed  #60 x 0   Entered by:   Harlow Mares CMA (AAMA)   Authorized by:   Mardella Layman MD [facility]   Signed by:   Harlow Mares CMA (AAMA) on 06/01/2010   Method used:   Print then Give to Patient   RxID:   [id number]

## 2010-08-02 NOTE — Assessment & Plan Note (Signed)
Summary: hemorrhoids    History of Present Illness Visit Type: Follow-up Visit Primary GI MD: Sheryn Bison MD FACP FAGA Primary Provider: Beverely Low, MD Requesting Provider: Roger Shelter, MD Chief Complaint: painful  hemorrhiods , with very little control over his bowels. Has been using Imodium AD History of Present Illness:   77 YO MALE KNOWN TO DR PATTERSON WITH HX OF IBS ,DIVERTICULOSIS,HEMORRHOIDS. LAST COLON 2003, NO POLYPS. HE WAS SEEN IN 11/10 AND TREATED FOR PROBABLE BACTERIAL OVERGROWTH WITH A COURSE OF FLAGYL AND ALIGN. HE DID FEEL ALITTLE BETTER,LESS GAS WITH FLAGYL. HE CONTINUES TO C/O MORE FREQUENT STOOLS,HAS ALREADY HAD 5-6 BMS TODAY. STOOLS ARE LOOSE TO LIQUID, NO BLOOD,WHEN HAVE FORM ARE VERY NARROW.. APPETITE IS GOOD, WEIGHT STABLE. HE HAS BEEN HAVING INTERNAL HEMORRHOID PAINS AS WELL,CURRENTLY ON A COURSE OF SUPPOSITORY BUT STILL HAVING OCCASIONAL SHARP PAINS WITH WALKING ETC.HE SAYS HE HAD AN INJECTION YEARS AGO WHICH HEPLED HIM FOR MANY YEARS UNTIL RECENTLY AND WOULD LIKE SOME THING DEFINITIVE DONE.   GI Review of Systems      Denies abdominal pain, acid reflux, belching, bloating, chest pain, dysphagia with liquids, dysphagia with solids, heartburn, loss of appetite, nausea, vomiting, vomiting blood, weight loss, and  weight gain.      Reports change in bowel habits, hemorrhoids, and  rectal pain.     Denies anal fissure, black tarry stools, constipation, diarrhea, diverticulosis, fecal incontinence, heme positive stool, irritable bowel syndrome, jaundice, light color stool, liver problems, and  rectal bleeding.    Current Medications (verified): 1)  Actonel 150 Mg  Tabs (Risedronate Sodium) .... Take 1 Tablet By Mouth Every Month 2)  Aricept 10 Mg Tabs (Donepezil Hcl) .... Take 1 Tablet By Mouth Once A Day 3)  Glimepiride 4 Mg Tabs (Glimepiride) .... Take 1 Tablet By Mouth Two Times A Day 4)  Lipitor 10 Mg Tabs (Atorvastatin Calcium) .... Take 1/2 Tablet By  Mouth Once Daily 5)  Exforge 10-320 Mg  Tabs (Amlodipine Besylate-Valsartan) .... Take 1 Tablet By Mouth Once A Day 6)  Actos 15 Mg  Tabs (Pioglitazone Hcl) .... Take 1 Tablet By Mouth Once A Day 7)  Bayer Aspirin 81 Mg  Tabs (Aspirin) .... Take 1 Tablet By Mouth Once A Day 8)  Nitroquick 0.4 Mg  Subl (Nitroglycerin) .... Use As Needed 9)  Metformin Hcl 1000 Mg  Tabs (Metformin Hcl) .... Take 1 Tab By Mouth Two Times A Day 10)  Freestyle Lite Test   Strp (Glucose Blood) .... Use Daily 11)  Synthroid 50 Mcg Tabs (Levothyroxine Sodium) .[name].. 1 Once Daily 12)  Chlorthalidone 25 Mg Tabs (Chlorthalidone) .... One Half Tablet Daily 13)  Klor-Con M20 20 Meq Cr-Tabs (Potassium Chloride Crys Cr) .... One Daily 14)  Ranexa 500 Mg Xr12h-Tab (Ranolazine) .... One Tablet By Mouth Once Daily 15)  Metamucil Smooth Texture 58.6 % Powd (Psyllium) .[name].. 1 Tbsp Two Times A Day 16)  Metronidazole 250 Mg Tabs (Metronidazole) .[name].. 1 By Mouth Three Times A Day X 10 Days 17)  Align   Caps (Misc Intestinal Flora Regulat) .... Take One Capsule By Mouth Daily 18)  Hydrocortisone Acetate 25 Mg Supp (Hydrocortisone Acetate) .... Use Every 6 Hours P.r.n. Pain  Allergies (verified): 1)  Codeine Phosphate (Codeine Phosphate)  Past History:  Past Medical History: Last updated: 04/13/2009 Coronary artery disease Diabetes mellitus, type II Hypertension Prostate cancer, hx of Hyperlipidemia Diverticulosis, colon essential tremor thyroid nodule and goiter mild cognitive impairment diabetic peripheral neuropathy  Past Surgical History:  Last updated: 03-09-08 Coronary artery bypass graft-1988 PTCA with stent circumflex artery, February 13th 2009 colonoscopy March 2003 prostate radiation treatments-2005  Family History: Last updated: March 09, 2008 father died age 14, complications  of a stroke and pneumonia mother died age 78  No siblings No FH of Colon Cancer:  Social History: Last updated:  12/15/2007 Married Patient has never smoked.  Alcohol Use - no Daily Caffeine Use Illicit Drug Use - no Patient gets regular exercise.  Review of Systems       The patient complains of fatigue, shortness of breath, and sleeping problems.  The patient denies allergy/sinus, anemia, anxiety-new, arthritis/joint pain, back pain, blood in urine, breast changes/lumps, change in vision, confusion, cough, coughing up blood, depression-new, fainting, fever, headaches-new, hearing problems, heart murmur, heart rhythm changes, itching, muscle pains/cramps, night sweats, nosebleeds, skin rash, sore throat, swelling of feet/legs, swollen lymph glands, thirst - excessive, urination - excessive, urination changes/pain, urine leakage, vision changes, and voice change.         OTHERWISE AS IN HPI  Vital Signs:  Patient profile:   77 year old male Height:      67 inches Weight:      209 pounds BMI:     32.85 BSA:     2.06 Pulse rate:   66 / minute Pulse rhythm:   regular BP sitting:   118 / 72  (left arm)  Vitals Entered By: Merri Ray CMA Duncan Dull) (July 18, 2009 1:47 PM)  Physical Exam  General:  Well developed, well nourished, no acute distress. Head:  Normocephalic and atraumatic. Eyes:  PERRLA, no icterus. Lungs:  Clear throughout to auscultation. Heart:  Regular rate and rhythm; no murmurs, rubs,  or bruits. Abdomen:  SOFT, BS+,NO FOCAL TENDERNESS, NO MASS OR HSM Rectal:  HEME NEGATIVE STOOL,NO HEMORRHOID PALPABLE Neurologic:  Alert and  oriented x4;  grossly normal neurologically. Psych:  Alert and cooperative. Normal mood and affect.   Impression & Recommendations:  Problem # 1:  INTERNAL HEMORRHOIDS (ICD-455.0) Assessment Deteriorated  RECENT EXACERBATION OF RECTAL PAIN,LIKELY SECONDARY TO INTERNAL HEMORRHOID,PT HAS HAD PROSTATE CANCER AND RADIATION,R/O PROCTITIS  TRIAL OF ANAMANTLE GEL 3 X DAILY SCHEDULE FOR COLONOSCOPY WITH DR PATTERSON WITH POSSIBLE INJECTION OF INTERNAL  HEMORRHOID IF INDICATED Orders: Colonoscopy (Colon)  Problem # 2:  DIVERTICULOSIS, COLON (ICD-562.10) Assessment: Comment Only  Problem # 3:  CHANGE IN BOWEL HABITS Assessment: New LOOSE FREQUENT STOOLS,R/O IBS, R/O MICROSCOPIC COLITIS ETC  TRIAL OF BENTYL 10 MG TWICE DAILY SCHEDULE FOR COLONOSCOPY WITH DR Jarold Motto WITH RANDOM BXS  Problem # 4:  HYPOTHYROIDISM (ICD-244.9) Assessment: Comment Only  Problem # 5:  HYPERTENSION (ICD-401.9) Assessment: Comment Only  Problem # 6:  DIABETES MELLITUS, TYPE II (ICD-250.00) Assessment: Comment Only  Problem # 7:  CORONARY ARTERY DISEASE (ICD-414.00) Assessment: Comment Only  Patient Instructions: 1)  We have scheduled the Colonoscopy with Dr. Jarold Motto for 07-24-09 at 3:30PM. 2)  Colonoscopy and conscous sedation brochure provided. 3)  We sent perscriptions to your pharamcy for the Moviprep for the Colonoscopy , Bentyl, and Anamantle cream for the hemorrhoidds. 4)  Copy sent to : Dr. Beverely Low, MD 5)                         Dr. Roger Shelter, MD 6)  The medication list was reviewed and reconciled.  All changed / newly prescribed medications were explained.  A complete medication list was provided to the patient / caregiver. Prescriptions: MOVIPREP 100  GM  SOLR (PEG-KCL-NACL-NASULF-NA ASC-C) As per prep instructions.  #1 x 0   Entered by:   Lowry Ram NCMA   Authorized by:   Sammuel Cooper PA-c   Signed by:   Lowry Ram NCMA on 07/18/2009   Method used:   Electronically to        [facility] [address] #[id number]* (retail)       [address] [place], [place]  [address]       Ph: [phone]       Fax: [phone]   RxID:   [id number] ANAMANTLE HC 3-0.5 % CREA (LIDOCAINE-HYDROCORTISONE ACE) Use the cream rectally for hemorrhoids  #30 cc x 0   Entered by:   Lowry Ram NCMA   Authorized by:   Sammuel Cooper PA-c   Signed by:   Lowry Ram NCMA on 07/18/2009   Method used:   Electronically to         [facility] [address] #[id number]* (retail)       [address] [place], [place]  [address]       Ph: [phone]       Fax: [phone]   RxID:   [id number] BENTYL 10 MG CAPS (DICYCLOMINE HCL) Take 1 tab twice daily  #60 x 1   Entered by:   Lowry Ram NCMA   Authorized by:   Sammuel Cooper PA-c   Signed by:   Lowry Ram NCMA on 07/18/2009   Method used:   Electronically to        [facility] [address] [facility] [address] #[id number]* (retail)       [address] [place], [place]  [address]       Ph: [phone]       Fax: [phone]   RxID:   [id number]

## 2010-08-02 NOTE — Letter (Signed)
Summary: [facility]   Imported By: Maryln Gottron 11/30/2009 12:37:19  _____________________________________________________________________  External Attachment:    Type:   Image     Comment:   External Document

## 2010-08-02 NOTE — Letter (Signed)
Summary: Medication Question from patient  Medication Question from patient   Imported By: Maryln Gottron 08/09/2009 12:52:45  _____________________________________________________________________  External Attachment:    Type:   Image     Comment:   External Document

## 2010-08-02 NOTE — Progress Notes (Signed)
Summary: refill test strips and exforge  Phone Note Call from Patient   Caller: Patient Reason for Call: Refill Medication Summary of Call: accu-chek compact plus test strips  exforge  refill to [facility] Initial call taken by: Duard Brady LPN,  July 20, 2010 10:39 AM    New/Updated Medications: ACCU-CHEK COMPACT  STRP (GLUCOSE BLOOD) once daily and prn Prescriptions: ACCU-CHEK COMPACT  STRP (GLUCOSE BLOOD) once daily and prn  #100 x 3   Entered by:   Duard Brady LPN   Authorized by:   Gordy Savers  MD   Signed by:   Duard Brady LPN on 16/04/9603   Method used:   Electronically to        [facility] [address] #[id number]* (retail)       [address] [place], [place]  [address]       Ph: [phone]       Fax: [phone]   RxID:   [id number] EXFORGE 10-320 MG  TABS (AMLODIPINE BESYLATE-VALSARTAN) Take 1 tablet by mouth once a day  #90 x 3   Entered by:   Duard Brady LPN   Authorized by:   Gordy Savers  MD   Signed by:   Duard Brady LPN on 53/66/4403   Method used:   Faxed to ...       [facility] [address] [facility] [address] #[id number]* (retail)       [address] [place], [place]  [address]       Ph: [phone]       Fax: [phone]   RxID:   [id number]

## 2010-08-02 NOTE — Procedures (Signed)
Summary: Colonoscopy  Patient: [name] Note: All result statuses are Final unless otherwise noted.  Tests: (1) Colonoscopy (COL)   COL Colonoscopy           DONE     [facility]     [address]     [place], [place]  [address]           COLONOSCOPY PROCEDURE REPORT           PATIENT:  [name] [name], [name] [name]  MR#:  [id number]     BIRTHDATE:  1930/03/27, 79 yrs. old  GENDER:  male           ENDOSCOPIST:  Vania Rea. Jarold Motto, MD, [facility]     Referred by:           PROCEDURE DATE:  07/24/2009     PROCEDURE:  Colonoscopy with biopsy and snare polypectomy     ASA CLASS:  Class II     INDICATIONS:  change in bowel habits RECTAL PAIN           MEDICATIONS:   Fentanyl 50 mcg IV, Versed 7 mg IV           DESCRIPTION OF PROCEDURE:   After the risks benefits and     alternatives of the procedure were thoroughly explained, informed     consent was obtained.  Digital rectal exam was performed and     revealed no rectal masses.   The LB [id number] endoscope     was introduced through the anus and advanced to the terminal ileum     which was intubated for a short distance, without limitations.     The quality of the prep was excellent, using MoviPrep.  The     instrument was then slowly withdrawn as the colon was fully     examined.     <<PROCEDUREIMAGES>>     FINDINGS:  The terminal ileum appeared normal. terminal ileum     There were multiple polyps identified and removed. in the left     colon. They were diminutive and likely inflammatory. Polyps were     snared, then cauterized with monopolar cautery. Retrieval was     successful. snare polyp  Severe diverticulosis was found in the     left colon. INCLUDING RECTUM AREA.NO "ITIS".RANDOM BIOPSIES OF     ENTIRE COLON DONE.  An ulcer was found in the rectum. ULCER     BIOPSIED.NO SIGNIFICENT HEMORRHOIDS OR FISSURE NOTED.   Retrofle     xed views in the rectum revealed ulcer.  NOT RADIATION RELATED.     The scope was then  withdrawn from the patient and the procedure     completed.           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           RECOMMENDATIONS:     1) high fiber diet     1.DAILY BENEFIBER     2.CANASA SUPP QHS #30.REFILL X 6     3.AWAIT BIOPSY RESULTS     4.TRAMADOL 50MG  Q6H PRN #60, REFILL X 6.  REPEAT EXAM:  No           ______________________________     Vania Rea. Jarold Motto, MD, [facility] Graham           CC:  Gordy Savers, MD           n.     Rosalie Doctor:   Vania Rea. Patterson at 07/24/2009 04:17 PM           [place], [id number]  Note: An exclamation mark (!) indicates a result that was not dispersed into the flowsheet. Document Creation Date: 07/24/2009 4:17 PM _______________________________________________________________________  (1) Order result status: Final Collection or observation date-time: 07/24/2009 16:03 Requested date-time:  Receipt date-time:  Reported date-time:  Referring Physician:   Ordering Physician: Sheryn Bison [phone]) Specimen Source:  Source: Launa Grill Order Number: [id number] Lab site:   Appended Document: Colonoscopy     Procedures Next Due Date:    Colonoscopy: 08/2014

## 2010-08-02 NOTE — Assessment & Plan Note (Signed)
Summary: GI ISSUES//CCM   Vital Signs:  Patient profile:   77 year old male Weight:      207 pounds Temp:     97.9 degrees F oral BP sitting:   138 / 76  (left arm) Cuff size:   regular  Vitals Entered By: Raechel Ache, RN (July 13, 2009 8:16 AM) CC: C/o hemorrhoids, no better. CBG Result 336   Primary Care Provider:  Beverely Low, MD  CC:  C/o hemorrhoids and no better.[name]  History of Present Illness: 77 year old patient seen today for follow-up.  He was seen 9 days ago complaining of rectal pain.  This respond well to suppositories, but has been persistent problem.  He states the past day or so however, he seems to have improved.  He saw Dr. Jarold Motto last month.  Clinical exam revealed a small hemorrhoidal external tag, but internal exam revealed some mild generalized tenderness, but no focal abnormalities.  He has had radiation treatment for prostate cancer  Preventive Screening-Counseling & Management  Alcohol-Tobacco     Smoking Status: never  Caffeine-Diet-Exercise     Does Patient Exercise: yes      Drug Use:  no.    Allergies: 1)  Codeine Phosphate (Codeine Phosphate)  Past History:  Past Medical History: Reviewed history from 04/13/2009 and no changes required. Coronary artery disease Diabetes mellitus, type II Hypertension Prostate cancer, hx of Hyperlipidemia Diverticulosis, colon essential tremor thyroid nodule and goiter mild cognitive impairment diabetic peripheral neuropathy  Physical Exam  General:  overweight-appearing.  normal blood pressureoverweight-appearing.   Abdomen:  Bowel sounds positive,abdomen soft and non-tender without masses, organomegaly or hernias noted.   Impression & Recommendations:  Problem # 1:  IBS (ICD-564.1)  Problem # 2:  DIABETES MELLITUS, TYPE II (ICD-250.00)  His updated medication list for this problem includes:    Glimepiride 4 Mg Tabs (Glimepiride) .[name]... Take 1 tablet by mouth two times a day  Actos 15 Mg Tabs (Pioglitazone hcl) .[name]... Take 1 tablet by mouth once a day    Metformin Hcl 1000 Mg Tabs (Metformin hcl) .[name]... Take 1 tab by mouth two times a day  Orders: Capillary Blood Glucose/CBG (40981)  His updated medication list for this problem includes:    Glimepiride 4 Mg Tabs (Glimepiride) .[name]... Take 1 tablet by mouth two times a day    Actos 15 Mg Tabs (Pioglitazone hcl) .[name]... Take 1 tablet by mouth once a day    Metformin Hcl 1000 Mg Tabs (Metformin hcl) .[name]... Take 1 tab by mouth two times a day  Complete Medication List: 1)  Actonel 150 Mg Tabs (Risedronate sodium) .... Take 1 tablet by mouth every month 2)  Aricept 10 Mg Tabs (Donepezil hcl) .... Take 1 tablet by mouth once a day 3)  Glimepiride 4 Mg Tabs (Glimepiride) .... Take 1 tablet by mouth two times a day 4)  Lipitor 10 Mg Tabs (Atorvastatin calcium) .... Take 1/2 tablet by mouth once daily 5)  Exforge 10-320 Mg Tabs (amlodipine Besylate-valsartan)  .... Take 1 tablet by mouth once a day 6)  Actos 15 Mg Tabs (Pioglitazone hcl) .... Take 1 tablet by mouth once a day 7)  Bayer Aspirin 81 Mg Tabs (aspirin)  .... Take 1 tablet by mouth once a day 8)  Nitroquick 0.4 Mg Subl (Nitroglycerin) .... Use as needed 9)  Metformin Hcl 1000 Mg Tabs (Metformin hcl) .... Take 1 tab by mouth two times a day 10)  Freestyle Lite Test Strp (Glucose blood) .Marland KitchenMarland KitchenMarland Kitchen  Use daily 11)  Synthroid 50 Mcg Tabs (Levothyroxine sodium) .[name].. 1 once daily 12)  Chlorthalidone 25 Mg Tabs (Chlorthalidone) .... One half tablet daily 13)  Klor-con M20 20 Meq Cr-tabs (Potassium chloride crys cr) .... One daily 14)  Ranexa 500 Mg Xr12h-tab (Ranolazine) .... One tablet by mouth once daily 15)  Metamucil Smooth Texture 58.6 % Powd (Psyllium) .[name].. 1 tbsp two times a day 16)  Metronidazole 250 Mg Tabs (Metronidazole) .[name].. 1 by mouth three times a day x 10 days 17)  Align Caps (Misc intestinal flora regulat) .... Take one capsule by mouth daily 18)  Hydrocortisone  Acetate 25 Mg Supp (Hydrocortisone acetate) .... Use every 6 hours p.r.n. pain  Other Orders: Venipuncture (78469) TLB-A1C / Hgb A1C (Glycohemoglobin) (83036-A1C) Prescription Created Electronically [id number])  Patient Instructions: 1)  Please schedule a follow-up appointment in 3 months. 2)  Check your blood sugars regularly. If your readings are usually above : or below 70 you should contact our office. 3)  It is important that your Diabetic A1c level is checked every 3 months. 4)  See your eye doctor yearly to check for diabetic eye damage. 5)  follow-up with Dr. Jarold Motto if unimproved Prescriptions: HYDROCORTISONE ACETATE 25 MG SUPP (HYDROCORTISONE ACETATE) use every 6 hours p.r.n. pain  #30 x 4   Entered and Authorized by:   Gordy Savers  MD   Signed by:   Gordy Savers  MD on 07/13/2009   Method used:   Print then Give to Patient   RxID:   [id number] HYDROCORTISONE ACETATE 25 MG SUPP (HYDROCORTISONE ACETATE) use every 6 hours p.r.n. pain  #30 x 4   Entered and Authorized by:   Gordy Savers  MD   Signed by:   Gordy Savers  MD on 07/13/2009   Method used:   Electronically to        [facility] [address] [id number]* (retail)       [address] [place], [place]  [address]       Ph: [phone]       Fax: [phone]   RxID:   [id number]

## 2010-08-02 NOTE — Progress Notes (Signed)
Summary: Is in alot of pain   Phone Note Call from Patient Call back at [phone]   Call For: DR Jarold Motto Summary of Call: Is in alot of pain and wants to be seen  sooner than next available on 07-25-09 Initial call taken by: Leanor Kail [facility],  July 17, 2009 11:50 AM  Follow-up for Phone Call        Given appt. with PA for tomorrow Follow-up by: Teryl Lucy RN,  July 17, 2009 2:26 PM

## 2010-08-02 NOTE — Progress Notes (Signed)
   Phone Note Call from Patient   Caller: Patient Summary of Call: On call note at 1910. Pt called with severe, ongoing rectal pain and consipation. No new symptoms. Review last office note and colonoscopy. Advised pt to start Sitz baths qid as needed and contact Dr. Jarold Motto tomorrow for further management plans. Pt agrees with the plan. Initial call taken by: Meryl Dare MD [facility] Graham,  October 02, 2009 9:30 PM

## 2010-08-02 NOTE — Miscellaneous (Signed)
Summary: Orders Update  records faxed to [facility] needs an appt with Dr. Mechele Claude, MD  Clinical Lists Changes  Orders: Added new Test order of [facility]) - Signed

## 2010-08-02 NOTE — Assessment & Plan Note (Signed)
Summary: BS check/dm   Vital Signs:  Patient profile:   77 year old male Weight:      187 pounds Temp:     97.8 degrees F oral BP sitting:   102 / 74  Vitals Entered By: Duard Brady LPN (February 16, 2010 10:34 AM) CC: concerns about bs -   fbs 199 Is Patient Diabetic? Yes Did you bring your meter with you today? Yes   Primary Care Cayla Wiegand:  Lance Sell, MD  CC:  concerns about bs -   fbs 199.  History of Present Illness: 77 year old patient who is seen today for follow-up.  There still been some confusion about his diabetic medication and he has been using  glimepiride  only sporadically.  His GI symptoms seem fairly stable.  He has one to 3 bowel movements per day and but is occasionally constipated.  He seems to tolerate metformin 1000 mg daily.  Complains of feeling unwell, but seems to have much less symptoms.  Today.  He is followed closely by GI and urology.  Blood pressure has been consistently low normal  Allergies: 1)  Codeine Phosphate (Codeine Phosphate)  Past History:  Past Medical History: Reviewed history from 04/13/2009 and no changes required. Coronary artery disease Diabetes mellitus, type II Hypertension Prostate cancer, hx of Hyperlipidemia Diverticulosis, colon essential tremor thyroid nodule and goiter mild cognitive impairment diabetic peripheral neuropathy  Review of Systems  The patient denies anorexia, fever, weight loss, weight gain, vision loss, decreased hearing, hoarseness, chest pain, syncope, dyspnea on exertion, peripheral edema, prolonged cough, headaches, hemoptysis, abdominal pain, melena, hematochezia, severe indigestion/heartburn, hematuria, incontinence, genital sores, muscle weakness, suspicious skin lesions, transient blindness, difficulty walking, depression, unusual weight change, abnormal bleeding, enlarged lymph nodes, angioedema, breast masses, and testicular masses.    Physical Exam  General:   overweight-appearing.  110/70overweight-appearing.   Head:  Normocephalic and atraumatic without obvious abnormalities. No apparent alopecia or balding. Mouth:  Oral mucosa and oropharynx without lesions or exudates.  Teeth in good repair. Neck:  No deformities, masses, or tenderness noted. Lungs:  Normal respiratory effort, chest expands symmetrically. Lungs are clear to auscultation, no crackles or wheezes. Heart:  Normal rate and regular rhythm. S1 and S2 normal without gallop, murmur, click, rub or other extra sounds. Abdomen:  Bowel sounds positive,abdomen soft and non-tender without masses, organomegaly or hernias noted. Msk:  No deformity or scoliosis noted of thoracic or lumbar spine.   Extremities:  No clubbing, cyanosis, edema, or deformity noted with normal full range of motion of all joints.     Impression & Recommendations:  Problem # 1:  IBS (ICD-564.1)  Problem # 2:  DIABETES MELLITUS, TYPE II (ICD-250.00)  His updated medication list for this problem includes:    Glimepiride 4 Mg Tabs (Glimepiride) .[name]... Take 1 tablet by mouth every am    Metformin Hcl 1000 Mg Tabs (Metformin hcl) ..... Qd  His updated medication list for this problem includes:    Glimepiride 4 Mg Tabs (Glimepiride) .[name]... Take 1 tablet by mouth every am    Metformin Hcl 1000 Mg Tabs (Metformin hcl) ..... Qd  Problem # 3:  HYPERTENSION (ICD-401.9)  The following medications were removed from the medication list:    Chlorthalidone 25 Mg Tabs (Chlorthalidone) ..... One half tablet daily  The following medications were removed from the medication list:    Chlorthalidone 25 Mg Tabs (Chlorthalidone) ..... One half tablet daily  Complete Medication List: 1)  Aricept 10 Mg Tabs (Donepezil hcl) .Marland KitchenMarland KitchenMarland Kitchen  Take 1 tablet by mouth once a day 2)  Glimepiride 4 Mg Tabs (Glimepiride) .... Take 1 tablet by mouth every am 3)  Lipitor 10 Mg Tabs (Atorvastatin calcium) .... Take 1/2 tablet by mouth once daily 4)  Exforge  10-320 Mg Tabs (amlodipine Besylate-valsartan)  .... Take 1 tablet by mouth once a day 5)  Bayer Aspirin 81 Mg Tabs (aspirin)  .... Take 1 tablet by mouth once a day 6)  Nitroquick 0.4 Mg Subl (Nitroglycerin) .... Use as needed 7)  Accu-chek Advantage Test Strp (Glucose blood) .... As directed 8)  Synthroid 50 Mcg Tabs (Levothyroxine sodium) .[name].. 1 once daily 9)  Tramadol Hcl 50 Mg Tabs (Tramadol hcl) .[name].. 1 by mouth q 6-8 hrs as needed pain 10)  Diltiazem Gel 2%  .... Apply to rectum 4-5 times daily 11)  Zenpep 20000 Unit Cpep (Pancrelipase (lip-prot-amyl)) .... 2 tabs three times a day before meals 12)  Boniva 150 Mg Tabs (Ibandronate sodium) 13)  Metformin Hcl 1000 Mg Tabs (Metformin hcl) .... Qd  Patient Instructions: 1)  Please schedule a follow-up appointment in 2 months. 2)  Limit your Sodium (Salt). 3)  It is important that you exercise regularly at least 20 minutes 5 times a week. If you develop chest pain, have severe difficulty breathing, or feel very tired , stop exercising immediately and seek medical attention. 4)  Check your blood sugars regularly. If your readings are usually above : or below 70 you should contact our office. 5)  It is important that your Diabetic A1c level is checked every 3 months. 6)  See your eye doctor yearly to check for diabetic eye damage.

## 2010-08-02 NOTE — Consult Note (Signed)
Summary: [facility]  [facility]   Imported By: Maryln Gottron 05/30/2010 15:06:23  _____________________________________________________________________  External Attachment:    Type:   Image     Comment:   External Document

## 2010-08-02 NOTE — Progress Notes (Signed)
Summary: Questions  Phone Note Call from Patient Call back at Home Phone [phone]   Caller: Patient Call For: Dr. Jarold Motto Reason for Call: Talk to Nurse Details for Reason: Several Issues Summary of Call: Pt. has been referred to Dr. Mills Koller in [place] for 11/22. Have we sent his records to her, or should he come by, pick them up, and take them with him? Pt. also states he is in tremendous pain and only has about 5 pain pills left. Can he have more, or is there anything else he should take such as OTC advil or tylenol. Please call patient and advise. Thank you. Initial call taken by: Schuyler Amor,  May 10, 2010 1:11 PM  Follow-up for Phone Call        Patient  advised his records have been [facility] to [facility] .  Dr Jarold Motto is it ok to refill his hydrocodone? Follow-up by: Darcey Nora RN, CGRN,  May 10, 2010 3:22 PM  Additional Follow-up for Phone Call Additional follow up Details #1::        yes Additional Follow-up by: Mardella Layman MD [facility],  May 11, 2010 8:44 AM    New/Updated Medications: HYDROCODONE-ACETAMINOPHEN 5-500 MG TABS (HYDROCODONE-ACETAMINOPHEN) 1-2 by mouth q 4 -6 hours as needed pain-must last x 1 month Prescriptions: HYDROCODONE-ACETAMINOPHEN 5-500 MG TABS (HYDROCODONE-ACETAMINOPHEN) 1-2 by mouth q 4 -6 hours as needed pain-must last x 1 month  #30 x 0   Entered by:   Darcey Nora RN, CGRN   Authorized by:   Mardella Layman MD [facility]   Signed by:   Darcey Nora RN, CGRN on 05/11/2010   Method used:   Printed then faxed to ...       [facility] [address] #[id number]* (retail)       [address] [place], [place]  [address]       Ph: [phone]       Fax: [phone]   RxID:   [id number]

## 2010-08-02 NOTE — Letter (Signed)
Summary: Letter regarding pain/Patient  Letter regarding pain/Patient   Imported By: Sherian Rein 04/02/2010 15:15:10  _____________________________________________________________________  External Attachment:    Type:   Image     Comment:   External Document

## 2010-08-02 NOTE — Progress Notes (Signed)
Summary: Triage   Phone Note Call from Patient   Caller: Patient Summary of Call: Pt reporting. [phone])  Pt used the miralax as instructed at last OV.  Each time he used this had loose stools that he could not control.  Cont's to have severe pain in rectum.  Pain radiates into scrotum.  Sat night  pain was so bad that pt had to sleep with ice pack on rectum.  Pt states that diltiazem  cream has not been very helpful.  Asking what he should do next. Initial call taken by: Ashok Cordia RN,  September 27, 2009 2:07 PM  Follow-up for Phone Call        referral Dr. Dwain Sarna surgery per possible rectal sphincterotomy.... Follow-up by: Mardella Layman MD FACG,  September 27, 2009 2:41 PM  Additional Follow-up for Phone Call Additional follow up Details #1::        Pt notified.  Called [facility], nurse to call us back with appt. Additional Follow-up by: Ashok Cordia RN,  September 27, 2009 4:57 PM    Additional Follow-up for Phone Call Additional follow up Details #2::    Appt sch for April 20 at 2:30 with Dr. Dwain Sarna.  Lupita Leash Surface RN  September 28, 2009 9:50 AM   Pt notified re appt. Follow-up by: Ashok Cordia RN,  September 28, 2009 2:44 PM

## 2010-08-02 NOTE — Assessment & Plan Note (Signed)
Summary: problems with mns and stomach pain.Marland Kitchenem    History of Present Illness Visit Type: Follow-up Visit Primary GI MD: Sheryn Bison MD FACP FAGA Primary Provider: Lance Sell, MD Requesting Provider: Roger Shelter, MD Chief Complaint: abdominal and rectal pain, diarrhea -fecal incontinence History of Present Illness:   The patient continues with some rectal incontinence and urgent stooling which seems to be tied to his large intake of fiber. He really denies rectal or abdominal pain. He takes an Imodium tablet becomes constipated. He is status post 40 radiation treatments for prostate cancer has had associated urinary incontinency for many years. He currently is undergoing biofeedback training for his fecal incontinence he. He uses p.r.n. diltiazem gel locally to his rectum. His diabetes is under good control on oral agents.  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 He is on multiple medications that were listed and reviewed today including Bentyl 10 mg twice a day. He denies anorexia, weight loss, upper GI, hepatobiliary, or systemic complaints. Is able to work daily and is maintaining his weight normally. He denies use of sorbitol fructose, gives no history of lactose intolerance. He also denies a history of previous pancreatitis or hepatitis. He uses tramadol p.r.n. basis for chronic pain.   GI Review of Systems    Reports abdominal pain.     Location of  Abdominal pain: lower abdomen.    Denies acid reflux, belching, bloating, chest pain, dysphagia with liquids, dysphagia with solids, heartburn, loss of appetite, nausea, vomiting, vomiting blood, weight loss, and  weight gain.      Reports diarrhea, fecal incontinence, and  rectal pain.     Denies anal fissure, black tarry stools, change in bowel habit, constipation, diverticulosis, heme positive stool, hemorrhoids, irritable bowel  syndrome, jaundice, light color stool, liver problems, and  rectal bleeding.    Current Medications (verified): 1)  Actonel 150 Mg  Tabs (Risedronate Sodium) .... Take 1 Tablet By Mouth Every Month 2)  Aricept 10 Mg Tabs (Donepezil Hcl) .... Take 1 Tablet By Mouth Once A Day 3)  Glimepiride 4 Mg Tabs (Glimepiride) .... Take 1 Tablet By Mouth Two Times A Day 4)  Lipitor 10 Mg Tabs (Atorvastatin Calcium) .... Take 1/2 Tablet By Mouth Once Daily 5)  Exforge 10-320 Mg  Tabs (Amlodipine Besylate-Valsartan) .... Take 1 Tablet By Mouth Once A Day 6)  Bayer Aspirin 81 Mg  Tabs (Aspirin) .... Take 1 Tablet By Mouth Once A Day 7)  Nitroquick 0.4 Mg  Subl (Nitroglycerin) .... Use As Needed 8)  Accu-Chek Advantage Test  Strp (Glucose Blood) .... As Directed 9)  Synthroid 50 Mcg Tabs (Levothyroxine Sodium) .[name].. 1 Once Daily 10)  Chlorthalidone 25 Mg Tabs (Chlorthalidone) .... One Half Tablet Daily 11)  Klor-Con M20 20 Meq Cr-Tabs (Potassium Chloride Crys Cr) .... One Daily 12)  Ranexa 500 Mg Xr12h-Tab (Ranolazine) .... One Tablet By Mouth Once Daily 13)  Bentyl 10 Mg Caps (Dicyclomine Hcl) .... Take 1 Tab Twice Daily 14)  Tramadol Hcl 50 Mg Tabs (Tramadol Hcl) .[name].. 1 By Mouth Q 6-8 Hrs As Needed Pain 15)  Diltiazem Gel 2% .... Apply To Rectum 4-5 Times Daily 16)  Glipizide 5 Mg Xr24h-Tab (Glipizide) .... One Daily  Allergies (verified): 1)  Codeine Phosphate (Codeine Phosphate)  Past History:  Past medical, surgical, family and social histories (including risk factors) reviewed for relevance to current acute and chronic problems.  Past Medical History:  Reviewed history from 04/13/2009 and no changes required. Coronary artery disease Diabetes mellitus, type II Hypertension Prostate cancer, hx of Hyperlipidemia Diverticulosis, colon essential tremor thyroid nodule and goiter mild cognitive impairment diabetic peripheral neuropathy  Past Surgical History: Reviewed history from 02/08/2008 and  no changes required. Coronary artery bypass graft-1988 PTCA with stent circumflex artery, February 13th 2009 colonoscopy March 2003 prostate radiation treatments-2005  Family History: Reviewed history from 02/08/2008 and no changes required. father died age 50, complications  of a stroke and pneumonia mother died age 16  No siblings No FH of Colon Cancer:  Social History: Reviewed history from 12/15/2007 and no changes required. Married Patient has never smoked.  Alcohol Use - no Daily Caffeine Use Illicit Drug Use - no Patient gets regular exercise.  Review of Systems       The patient complains of fatigue, shortness of breath, sleeping problems, and urine leakage.  The patient denies allergy/sinus, anemia, anxiety-new, arthritis/joint pain, back pain, blood in urine, breast changes/lumps, change in vision, confusion, cough, coughing up blood, depression-new, fainting, fever, headaches-new, hearing problems, heart murmur, heart rhythm changes, itching, menstrual pain, night sweats, nosebleeds, pregnancy symptoms, skin rash, sore throat, swelling of feet/legs, swollen lymph glands, thirst - excessive , urination - excessive , urination changes/pain, vision changes, and voice change.    Vital Signs:  Patient profile:   77 year old male Height:      67 inches Weight:      194.13 pounds BMI:     30.51 Pulse rate:   72 / minute Pulse rhythm:   regular BP sitting:   138 / 76  (left arm) Cuff size:   regular  Vitals Entered By: June McMurray CMA [facility]) (December 26, 2009 4:02 PM)  Physical Exam  General:  Well developed, well nourished, no acute distress.healthy appearing.   Head:  Normocephalic and atraumatic. Eyes:  PERRLA, no icterus.exam deferred to patient's ophthalmologist.   Abdomen:  Soft, nontender and nondistended. No masses, hepatosplenomegaly or hernias noted. Normal bowel sounds. Rectal:  Normal exam.hemocult negative.  No rectal masses or tenderness noted or  induration in the rectum. I cannot see evidence of perirectal swelling at this time. There is no evidence of perirectal excoriation or rash. Psych:  Alert and cooperative. Normal mood and affect.   Impression & Recommendations:  Problem # 1:  ANAL SPASM (ICD-564.6) Assessment Improved Continue diltiazem gel as needed and biofeedback training.Fecal elastase-1 exam to exclude pancreatic malabsorption. Orders: T- * Misc. Laboratory test [phone])  Problem # 2:  IBS (ICD-564.1) Assessment: Improved Trial of half an Imodium tablet on a regular basis at bedtime with [facility] tabs twice a day and otherwise decreased p.o. fiber. Currently he is having too much fiber in his diet historically.  Problem # 3:  DIABETES MELLITUS, TYPE II (ICD-250.00) Assessment: Improved Continue meds per primary care  Patient Instructions: 1)  Please go to the basement for lab instructions. 2)  Begin [facility] two times a day. 3)  Begin Imodium AD 1/2 tab at bedtime. 4)  The medication list was reviewed and reconciled.  All changed / newly prescribed medications were explained.  A complete medication list was provided to the patient / caregiver. 5)  Copy sent to : Dr. Beverely Low and Dr. Avel Peace at [facility] surgery.

## 2010-08-02 NOTE — Miscellaneous (Signed)
Summary: change glimepiride  Clinical Lists Changes  Medications: Changed medication from GLIMEPIRIDE 4 MG TABS (GLIMEPIRIDE) Take 1 tablet by mouth every am to GLIMEPIRIDE 4 MG TABS (GLIMEPIRIDE) Take 1/2 tablet by mouth every am  change to med - pt aware. r/t labs. KIK

## 2010-08-02 NOTE — Progress Notes (Signed)
Summary: Refill Diltiazem Gel   Phone Note From Pharmacy   Summary of Call: [facility] pharmacy requesting a refill on Diltiazem gel.  Refill faxed to pharmacy. Initial call taken by: Ashok Cordia RN,  Nov 07, 2009 8:36 AM

## 2010-08-02 NOTE — Progress Notes (Signed)
Summary: Discuss rectal problems with nurse   Phone Note Call from Patient Call back at Home Phone [phone]   Call For: Dr Jarold Motto Summary of Call: Wants to discuss his Rectal Problems with nurse. Initial call taken by: Leanor Kail [facility],  August 22, 2009 12:31 PM  Follow-up for Phone Call        Lm for pt to call.  Lupita Leash Surface RN  August 23, 2009 8:42 AM  Pt states that he left off the canasa suppos yesterday and has felt better.  No rectal pain today.  Will remain off the suppos and report back. Follow-up by: Ashok Cordia RN,  August 23, 2009 3:59 PM

## 2010-08-02 NOTE — Progress Notes (Signed)
Summary: info received?  Phone Note Call from Patient Call back at Home Phone [phone]   Caller: Patient Call For: Dr. Jarold Motto Reason for Call: Talk to Nurse Summary of Call: wants to know if Dr. Jarold Motto has received info from hospitalization this past weekend Initial call taken by: Vallarie Mare,  April 24, 2010 2:14 PM  Follow-up for Phone Call        Dr Jarold Motto the patient would like you to review the discharge summary in his chart from his hospital stay.  He believes his pain is coming from the rectal ulcer.  He is requesting more pain meds until his appointment at [facility] next month.  Please advise. Follow-up by: Darcey Nora RN, CGRN,  April 24, 2010 2:36 PM  Additional Follow-up for Phone Call Additional follow up Details #1::        ok Additional Follow-up by: Mardella Layman MD FACG,  April 24, 2010 2:40 PM    Additional Follow-up for Phone Call Additional follow up Details #2::    new rx sent to pharmacy Follow-up by: Darcey Nora RN, CGRN,  April 24, 2010 2:54 PM  Prescriptions: HYDROCODONE-ACETAMINOPHEN 5-500 MG TABS (HYDROCODONE-ACETAMINOPHEN) 1-2 by mouth q 4 -6 hours as needed pain-must last x 1 month  #30 x 0   Entered by:   Darcey Nora RN, CGRN   Authorized by:   Mardella Layman MD [facility]   Signed by:   Darcey Nora RN, CGRN on 04/24/2010   Method used:   Printed then faxed to ...       [facility] [address] #[id number]* (retail)       [address] [place], [place]  [address]       Ph: [phone]       Fax: [phone]   RxID:   [id number]

## 2010-08-02 NOTE — Progress Notes (Signed)
Summary: upcoming appt   Phone Note Call from Patient Call back at Home Phone [phone]   Caller: Patient Call For: Dr. Jarold Motto Reason for Call: Talk to Nurse Summary of Call: At your suggestion, pt has appt w/urologist Monday morning. Would like to talk to you before then. Initial call taken by: Schuyler Amor,  February 02, 2010 3:24 PM  Follow-up for Phone Call        Pt has appt with Dr. Alisa Graff at [facility] urology on Monday am.  Pt req that we send his records to Dr. Aldean Ast, so he will have them to review.  fax # [phone] Follow-up by: Ashok Cordia RN,  February 02, 2010 3:50 PM  Additional Follow-up for Phone Call Additional follow up Details #1::        Records faxed as requested. Additional Follow-up by: Ashok Cordia RN,  February 02, 2010 4:09 PM

## 2010-08-02 NOTE — Progress Notes (Signed)
Summary: triage   Phone Note Call from Patient Call back at [phone]   Caller: Patient Call For: Dr. Jarold Motto Reason for Call: Talk to Nurse Summary of Call: increasing rectal pain radiating to front... inconsistent and unrelated to anything Initial call taken by: Vallarie Mare,  October 02, 2009 2:18 PM  Follow-up for Phone Call        Per Dr. Jarold Motto,  We can refer to [facility] if pt wishes.  Has appt with surgeon on April 20. Follow-up by: Ashok Cordia RN,  October 03, 2009 8:48 AM  Additional Follow-up for Phone Call Additional follow up Details #1::        Talked with pt.  He perfers to stay here in town for his medical care.  Asks if we can try to get appt with surgeon moved up.   Ashok Cordia RN  October 03, 2009 12:29 PM    Additional Follow-up for Phone Call Additional follow up Details #2::    Called [facility], pt can be seen by Dr. Abbey Chatters tomorrow at 12:00.   Pt notified of appt.  Records faxed. Follow-up by: Ashok Cordia RN,  October 03, 2009 3:29 PM

## 2010-08-02 NOTE — Progress Notes (Signed)
Summary: Rectal pain  Phone Note Outgoing Call Call back at [facility] Phone [phone]   Call placed by: Darcey Nora RN, CGRN,  March 29, 2010 4:05 PM Call placed to: Patient Summary of Call: Called patient in response to a fax that we recieved from the patient .  He is having rectal pain and incontinence.  Unable to sleep due to the pain.  Reviewed with the Mike Gip PA patient ok for hydrocodone 1-2 by mouth q 4-6 hours at hs .  See fax dated 03/29/10 Initial call taken by: Darcey Nora RN, CGRN,  March 29, 2010 4:22 PM    New/Updated Medications: HYDROCODONE-ACETAMINOPHEN 5-500 MG TABS (HYDROCODONE-ACETAMINOPHEN) 1-2 by mouth q 4 -6 hours as needed pain Prescriptions: HYDROCODONE-ACETAMINOPHEN 5-500 MG TABS (HYDROCODONE-ACETAMINOPHEN) 1-2 by mouth q 4 -6 hours as needed pain  #20 x 0   Entered by:   Darcey Nora RN, CGRN   Authorized by:   Sammuel Cooper PA-c   Signed by:   Darcey Nora RN, CGRN on 03/29/2010   Method used:   Printed then faxed to ...       [facility] [address] #[id number]* (retail)       [address] [place], [place]  [address]       Ph: [phone]       Fax: [phone]   RxID:   [id number]

## 2010-08-02 NOTE — Progress Notes (Signed)
Summary: Rectal pain   Phone Note Call from Patient Call back at Home Phone [phone]   Caller: Patient Summary of Call: patient still having some rectal pain after his colonoscopy.  Initial call taken by: Harlow Mares CMA [facility]),  September 13, 2009 2:53 PM  Follow-up for Phone Call        Pt cont's to have rectal pain and trouble with bowels.  Pt has OV sch with Dr. Jarold Motto on 09/18/09.  Pt did not know about appt.  He will discuss symptoms with Dr at [facility]. Follow-up by: Ashok Cordia RN,  September 13, 2009 3:25 PM

## 2010-08-02 NOTE — Letter (Signed)
Summary: [facility]   Imported By: Sherian Rein 04/02/2010 15:16:11  _____________________________________________________________________  External Attachment:    Type:   Image     Comment:   External Document

## 2010-08-02 NOTE — Assessment & Plan Note (Signed)
Summary: Pt req OV/dfs    History of Present Illness Visit Type: Follow-up Visit Primary GI MD: Sheryn Bison MD FACP FAGA Primary Provider: Eleonore Chiquito, MD  Requesting Provider: na Chief Complaint: Rectal pain, constipaiton, and diarrhea  History of Present Illness:   Continued unexplained daily rectal pain. Diltiazem gel has not helped. Repeated rectal exams by myself, surgery, and Dr. Aldean Ast all been unremarkable. He continues to have slow mental deterioration. He was found to have pancreatic insufficiency and is on pancreatic extract therapy.   GI Review of Systems      Denies abdominal pain, acid reflux, belching, bloating, chest pain, dysphagia with liquids, dysphagia with solids, heartburn, loss of appetite, nausea, vomiting, vomiting blood, weight loss, and  weight gain.      Reports change in bowel habits, constipation, and  rectal pain.     Denies anal fissure, black tarry stools, diarrhea, diverticulosis, fecal incontinence, heme positive stool, hemorrhoids, irritable bowel syndrome, jaundice, light color stool, liver problems, and  rectal bleeding.    Current Medications (verified): 1)  Aricept 10 Mg Tabs (Donepezil Hcl) .... Take 1 Tablet By Mouth Once A Day 2)  Glimepiride 4 Mg Tabs (Glimepiride) .... Take 1 Tablet By Mouth Every Am 3)  Lipitor 10 Mg Tabs (Atorvastatin Calcium) .... Take 1/2 Tablet By Mouth Once Daily 4)  Exforge 10-320 Mg  Tabs (Amlodipine Besylate-Valsartan) .... Take 1 Tablet By Mouth Once A Day 5)  Bayer Aspirin 81 Mg  Tabs (Aspirin) .... Take 1 Tablet By Mouth Once A Day 6)  Nitroquick 0.4 Mg  Subl (Nitroglycerin) .... Use As Needed 7)  Accu-Chek Advantage Test  Strp (Glucose Blood) .... As Directed 8)  Synthroid 50 Mcg Tabs (Levothyroxine Sodium) .[name].. 1 Once Daily 9)  Tramadol Hcl 50 Mg Tabs (Tramadol Hcl) .[name].. 1 By Mouth Q 6-8 Hrs As Needed Pain 10)  Diltiazem Gel 2% .... Apply To Rectum 4-5 Times Daily 11)  Zenpep 20000 Unit Cpep  (Pancrelipase (Lip-Prot-Amyl)) .... 2 Tabs Three Times A Day Before Meals 12)  Boniva 150 Mg Tabs (Ibandronate Sodium) .... One Tablet By Mouth Once A Month 13)  Metformin Hcl 1000 Mg Tabs (Metformin Hcl) .... One Tablet By Mouth Once Daily 14)  Metamucil 30.9 % Powd (Psyllium) .... Once Daily 15)  Vitamin D3 1000 Unit Tabs (Cholecalciferol) .... One Tablet By Mouth Once Daily 16)  Imodium A-D 2 Mg Tabs (Loperamide Hcl) .... 1/2 Tablet By Mouth As Needed 17)  Stool Softener 250 Mg Caps (Docusate Sodium) .... Six Capsules By Mouth Once Daily  Allergies (verified): 1)  Codeine Phosphate (Codeine Phosphate)  Past History:  Past medical, surgical, family and social histories (including risk factors) reviewed for relevance to current acute and chronic problems.  Past Medical History: essential tremor thyroid nodule and goiter mild cognitive impairment  ANAL SPASM (ICD-564.6) ULCER OF ANUS AND RECTUM (ICD-569.41) HYPOTHYROIDISM (ICD-244.9) DIABETIC PERIPHERAL NEUROPATHY (ICD-250.60) LEG CRAMPS, NOCTURNAL (ICD-729.82) GERD (ICD-530.81) IBS (ICD-564.1) IRON DEFICIENCY (ICD-280.9) INTERNAL HEMORRHOIDS (ICD-455.0) BACK PAIN (ICD-724.5) ANEMIA NEC (ICD-285.8) SYMPTOM, MALAISE AND FATIGUE NEC (ICD-780.79) BACK PAIN (ICD-724.5) DIVERTICULOSIS, COLON (ICD-562.10) HYPERLIPIDEMIA (ICD-272.4) PROSTATE CANCER, HX OF (ICD-V10.46) HYPERTENSION (ICD-401.9) DIABETES MELLITUS, TYPE II (ICD-250.00) CORONARY ARTERY DISEASE (ICD-414.00)  Past Surgical History: Reviewed history from 02/08/2008 and no changes required. Coronary artery bypass graft-1988 PTCA with stent circumflex artery, February 13th 2009 colonoscopy March 2003 prostate radiation treatments-2005  Family History: Reviewed history from 02/08/2008 and no changes required. father died age 43, complications  of a stroke and  pneumonia mother died age 52  No siblings No FH of Colon Cancer:  Social History: Reviewed history from  12/15/2007 and no changes required. Married Patient has never smoked.  Alcohol Use - no Daily Caffeine Use Illicit Drug Use - no Patient gets regular exercise.  Review of Systems       The patient complains of back pain, change in vision, confusion, fatigue, sleeping problems, and urination - excessive.  The patient denies allergy/sinus, anemia, anxiety-new, arthritis/joint pain, blood in urine, breast changes/lumps, cough, coughing up blood, depression-new, fainting, fever, headaches-new, hearing problems, heart murmur, heart rhythm changes, itching, muscle pains/cramps, night sweats, nosebleeds, shortness of breath, skin rash, sore throat, swelling of feet/legs, swollen lymph glands, thirst - excessive, urination changes/pain, urine leakage, vision changes, and voice change.    Vital Signs:  Patient profile:   77 year old male Height:      67 inches Weight:      189 pounds BMI:     29.71 BSA:     1.98 Pulse rate:   64 / minute Pulse rhythm:   regular BP sitting:   124 / 80  (left arm) Cuff size:   regular  Vitals Entered By: Ok Anis CMA (March 02, 2010 10:45 AM)  Physical Exam  General:  Well developed, well nourished, no acute distress.healthy appearing.  healthy appearing.   Head:  Normocephalic and atraumatic. Eyes:  PERRLA, no icterus. Abdomen:  Soft, nontender and nondistended. No masses, hepatosplenomegaly or hernias noted. Normal bowel sounds. Rectal:  Normal exam.hemocult negative.  hemocult negative.   Psych:  Alert and cooperative. Normal mood and affect.poor concentration.  poor concentration.     Impression & Recommendations:  Problem # 1:  ANAL SPASM (ICD-564.6) Assessment Unchanged discontinue diltiazem gel and try concentrated lidocaine cream locally several times a day while continuing pancreatic extract. At his request I will check zinc, thiamine, B12, and magnesium levels. Apparently primary care as scheduled neurology workup for mental  deterioration.  Problem # 2:  IBS (ICD-564.1) Assessment: Unchanged Continue pancreatic extracts per low fecal elastase-1 levels. The etiology of his chronic pancreatitis is unclear. I cannot see any history of chronic ethanol abuse recurrent pancreatitis. He is status post surgery and radiation for prostate cancer. He had recent neurologic evaluation by Dr. Aldean Ast which was unremarkable.   Other Orders: TLB-B12, Serum-Total ONLY (78295-A21) TLB-Ferritin (82728-FER) TLB-Folic Acid (Folate) (82746-FOL) TLB-IBC Pnl (Iron/FE;Transferrin) (83550-IBC) TLB-CRP-High Sensitivity (C-Reactive Protein) (86140-FCRP) TLB-Magnesium (Mg) (83735-MG) TLB-Sedimentation Rate (ESR) (85652-ESR) T- * Misc. Laboratory test [phone])  Patient Instructions: 1)  Please go to the basement for lab work. 2)  Stop diltiazem gel. 3)  Pick up rx at your pharmacy. 4)  Begin Co Enzyme Q daily.  This is OTC. 5)  The medication list was reviewed and reconciled.  All changed / newly prescribed medications were explained.  A complete medication list was provided to the patient / caregiver. 6)  Copy sent to : Dr. Vic Blackbird in urology, Dr. Beverely Low in internal medicine. Dr. Gaynelle Adu in surgery. 7)  Constipation and Hemorrhoids brochure given.   Appended Document: Pt req OV/dfs    Clinical Lists Changes  Medications: Removed medication of * DILTIAZEM GEL 2% apply to rectum 4-5 times daily Added new medication of LIDOCAINE HCL 2 % GEL (LIDOCAINE HCL) Apply to rectum 3-4 x daily as needed - Signed Added new medication of CO-ENZYME Q-10 60 MG CAPS (COENZYME Q10) daily Rx of LIDOCAINE HCL 2 % GEL (LIDOCAINE HCL) Apply  to rectum 3-4 x daily as needed;  #45 gm x 3;  Signed;  Entered by: Ashok Cordia RN;  Authorized by: Mardella Layman MD [facility];  Method used: Electronically to [facility] [id number]*, [address] [place], [place]  [address], Ph: [phone], Fax: [phone] Orders: Added new  Test order of TLB-Amylase (82150-AMYL) - Signed Added new Test order of TLB-Lipase (83690-LIPASE) - Signed    Prescriptions: LIDOCAINE HCL 2 % GEL (LIDOCAINE HCL) Apply to rectum 3-4 x daily as needed  #45 gm x 3   Entered by:   Ashok Cordia RN   Authorized by:   Mardella Layman MD [facility]   Signed by:   Ashok Cordia RN on 03/02/2010   Method used:   Electronically to        [facility] [id number]* (retail)       [address] [place], [place]  [address]       Ph: [phone]       Fax: [phone]   RxID:   [id number]

## 2010-08-02 NOTE — Progress Notes (Signed)
Summary: triage   Phone Note Call from Patient Call back at Home Phone [phone]   Caller: Patient Call For: Jarold Motto Reason for Call: Talk to Nurse Summary of Call: Patient wants to speak to nurse regarding "rectal problems" Initial call taken by: Tawni Levy,  January 30, 2010 9:52 AM  Follow-up for Phone Call        Lm for pt to call.   Lupita Leash Surface RN  January 30, 2010 10:30 AM  talked with pt.  C/O severe pain in rectal,testical area last pm.  States this was the worse pain that he had ever had.  Better this am.  Pt states diarrhea and incontience is better since starting on the Zenpep.  Pt asks if this rx will also help the pain that he has or will this be something he will have for the rest of his life? Pt asks if he needs to cont the zenpep.  IF so asks it there is a less expensive alternative.  He will check his formulary. Follow-up by: Ashok Cordia RN,  January 30, 2010 12:28 PM  Additional Follow-up for Phone Call Additional follow up Details #1::        Pt calling back.  Requesting a stronger pain med.  Has been taking ultram and tylenol.  Not helping.  Still having rectal, scrotal pain.  Asking for rx to use until he can see Dr. Jarold Motto again or at least talk with Dr. Jarold Motto next week. Additional Follow-up by: Ashok Cordia RN,  February 01, 2010 3:19 PM    Additional Follow-up for Phone Call Additional follow up Details #2::    IS HE USING DILTIAZEM GEL FOR HIS RECTAL SPASMS?  IF SO THEN I DO NOT HAVE ANY OTHER SUGGESTIONS FOR PAIN CONTROL-NOT SURE WHY HE IS HAVING SCROTAL PAIN-PROBABLY SHOULD SEE HIS UROLOGIST   TH ZEN PEP IS FOR DIARRHEA .IT WILL NOT HELP HIS PAIN. COULD SEE IF CAN GET HIM A GENERIC PANCREASE SUPPLEMENT . Follow-up by: Sammuel Cooper PA-c,  February 01, 2010 3:30 PM   Appended Document: triage Talked with pt.  States he has some of the diltiazem gel and he will resume it.  Pt req refill on tramaldol.  Would like OV with Dr. Jarold Motto.  Pt  informed that Dr. Jarold Motto is away from the office and will return next week.     Clinical Lists Changes  Medications: Rx of TRAMADOL HCL 50 MG TABS (TRAMADOL HCL) 1 by mouth q 6-8 hrs as needed pain;  #60 x 2;  Signed;  Entered by: Ashok Cordia RN;  Authorized by: Mardella Layman MD [facility];  Method used: Electronically to [facility] [id number]*, [address], [place], [place]  [address], Ph: [phone], Fax: [phone]    Prescriptions: TRAMADOL HCL 50 MG TABS (TRAMADOL HCL) 1 by mouth q 6-8 hrs as needed pain  #60 x 2   Entered by:   Ashok Cordia RN   Authorized by:   Mardella Layman MD [facility]   Signed by:   Ashok Cordia RN on 02/01/2010   Method used:   Electronically to        [facility] [id number]* (retail)       [address] [place], [place]  [address]       Ph: [phone]       Fax: [phone]   RxID:   [id number]

## 2010-08-02 NOTE — Progress Notes (Signed)
Summary: Rectal pain   Phone Note Call from Patient Call back at Home Phone [phone]   Caller: Patient Summary of Call: patient is having alot of rectal pain and would like to discuss. Initial call taken by: Harlow Mares CMA (AAMA),  January 02, 2010 9:11 AM  Follow-up for Phone Call        LM for pt to call.  Lupita Leash Surface RN  January 03, 2010 8:50 AM  Talked with pt.  Pt to start Zenpep as instructed by Dr. Jarold Motto,  See append to labs,  (pancreatic Elastase) Follow-up by: Ashok Cordia RN,  January 04, 2010 8:20 AM

## 2010-08-02 NOTE — Letter (Signed)
Summary: [facility]  [facility]   Imported By: Maryln Gottron 09/28/2009 15:35:18  _____________________________________________________________________  External Attachment:    Type:   Image     Comment:   External Document

## 2010-08-02 NOTE — Medication Information (Signed)
Summary: Order for Diabetes Testing Supplies  Order for Diabetes Testing Supplies   Imported By: Maryln Gottron 09/25/2009 10:45:27  _____________________________________________________________________  External Attachment:    Type:   Image     Comment:   External Document

## 2010-08-02 NOTE — Assessment & Plan Note (Signed)
Summary: 3 mo rov/mm   Vital Signs:  Patient profile:   77 year old male Weight:      204 pounds Temp:     98.2 degrees F oral BP sitting:   128 / 60  (left arm) Cuff size:   regular  Vitals Entered By: Duard Brady LPN (September 19, 2009 4:22 PM) CC: c/o of no improvement from last visit . had colo done - problems with bowels ever since - incont. of stool. seen be gi yesterday     Is Patient Diabetic? Yes CBG Result 118   Primary Care Provider:  Lance Sell, MD  CC:  c/o of no improvement from last visit . had colo done - problems with bowels ever since - incont. of stool. seen be gi yesterday    .  History of Present Illness: 77 year old patient who is seen to for follow is type 2 diabetes.  He has coronary artery disease, hypertension, and dyslipidemia.  he was evaluated by GI.  Yesterday.  He continues to have the constipation, but with frequent periods lose bowel movements associated with incontinence.  [place] his incontinence was a major issue.  At times.  He is forced to wear adult diapers.  He was seen by GI.  Yesterday, and medications adjusted.  He has been on metformin for some time.  I believe that metformin has been held in the past and did not seem to be correlated to any GI symptoms  Allergies: 1)  Codeine Phosphate (Codeine Phosphate)  Past History:  Past Medical History: Reviewed history from 04/13/2009 and no changes required. Coronary artery disease Diabetes mellitus, type II Hypertension Prostate cancer, hx of Hyperlipidemia Diverticulosis, colon essential tremor thyroid nodule and goiter mild cognitive impairment diabetic peripheral neuropathy  Review of Systems  The patient denies anorexia, fever, weight loss, weight gain, vision loss, decreased hearing, hoarseness, chest pain, syncope, dyspnea on exertion, peripheral edema, prolonged cough, headaches, hemoptysis, abdominal pain, melena, hematochezia, severe  indigestion/heartburn, hematuria, incontinence, genital sores, muscle weakness, suspicious skin lesions, transient blindness, difficulty walking, depression, unusual weight change, abnormal bleeding, enlarged lymph nodes, angioedema, breast masses, and testicular masses.    Physical Exam  General:  overweight-appearing.   Head:  Normocephalic and atraumatic without obvious abnormalities. No apparent alopecia or balding. Eyes:  No corneal or conjunctival inflammation noted. EOMI. Perrla. Funduscopic exam benign, without hemorrhages, exudates or papilledema. Vision grossly normal. Mouth:  Oral mucosa and oropharynx without lesions or exudates.  Teeth in good repair. Neck:  No deformities, masses, or tenderness noted. Lungs:  Normal respiratory effort, chest expands symmetrically. Lungs are clear to auscultation, no crackles or wheezes. Heart:  Normal rate and regular rhythm. S1 and S2 normal without gallop, murmur, click, rub or other extra sounds. Abdomen:  Bowel sounds positive,abdomen soft and non-tender without masses, organomegaly or hernias noted.   Impression & Recommendations:  Problem # 1:  DIABETES MELLITUS, TYPE II (ICD-250.00)  The following medications were removed from the medication list:    Metformin Hcl 1000 Mg Tabs (Metformin hcl) .[name]... Take 1 tab by mouth two times a day His updated medication list for this problem includes:    Glimepiride 4 Mg Tabs (Glimepiride) .[name]... Take 1 tablet by mouth two times a day    Actos 15 Mg Tabs (Pioglitazone hcl) .[name]... Take 1 tablet by mouth once a day    Glipizide 5 Mg Xr24h-tab (Glipizide) ..... One daily  Orders: Capillary Blood Glucose/CBG (29562)  Problem #  2:  HYPERTENSION (ICD-401.9)  His updated medication list for this problem includes:    Chlorthalidone 25 Mg Tabs (Chlorthalidone) ..... One half tablet daily  Complete Medication List: 1)  Actonel 150 Mg Tabs (Risedronate sodium) .... Take 1 tablet by mouth every month 2)   Aricept 10 Mg Tabs (Donepezil hcl) .... Take 1 tablet by mouth once a day 3)  Glimepiride 4 Mg Tabs (Glimepiride) .... Take 1 tablet by mouth two times a day 4)  Lipitor 10 Mg Tabs (Atorvastatin calcium) .... Take 1/2 tablet by mouth once daily 5)  Exforge 10-320 Mg Tabs (amlodipine Besylate-valsartan)  .... Take 1 tablet by mouth once a day 6)  Actos 15 Mg Tabs (Pioglitazone hcl) .... Take 1 tablet by mouth once a day 7)  Bayer Aspirin 81 Mg Tabs (aspirin)  .... Take 1 tablet by mouth once a day 8)  Nitroquick 0.4 Mg Subl (Nitroglycerin) .... Use as needed 9)  Accu-chek Advantage Test Strp (Glucose blood) .... As directed 10)  Synthroid 50 Mcg Tabs (Levothyroxine sodium) .[name].. 1 once daily 11)  Chlorthalidone 25 Mg Tabs (Chlorthalidone) .... One half tablet daily 12)  Klor-con M20 20 Meq Cr-tabs (Potassium chloride crys cr) .... One daily 13)  Ranexa 500 Mg Xr12h-tab (Ranolazine) .... One tablet by mouth once daily 14)  Bentyl 10 Mg Caps (Dicyclomine hcl) .... Take 1 tab twice daily 15)  Tramadol Hcl 50 Mg Tabs (Tramadol hcl) .... As needed for pain 16)  Amitiza 8 Mcg Caps (Lubiprostone) .[name].. 1 two times a day/take with food and water 17)  Diltiazem Gel 2%  .... Apply to rectum 4-5 times daily 18)  Miralax Powd (Polyethylene glycol 3350) .... Daily 19)  Glipizide 5 Mg Xr24h-tab (Glipizide) .... One daily  Patient Instructions: 1)  d/c metformin 2)  Glipizide 5 mg daily 3)  Please schedule a follow-up appointment in 1 month. Prescriptions: GLIPIZIDE 5 MG XR24H-TAB (GLIPIZIDE) one daily  #90 x 0   Entered and Authorized by:   Gordy Savers  MD   Signed by:   Gordy Savers  MD on 09/19/2009   Method used:   Print then Give to Patient   RxID:   [id number] GLIPIZIDE 5 MG XR24H-TAB (GLIPIZIDE) one daily  #90 x 0   Entered and Authorized by:   Gordy Savers  MD   Signed by:   Gordy Savers  MD on 09/19/2009   Method used:   Electronically to        [facility]  [id number]* (retail)       [address] [place], [place]  [address]       Ph: [phone]       Fax: [phone]   RxID:   [id number]

## 2010-08-02 NOTE — Assessment & Plan Note (Signed)
Summary: hemorrhoids/dm   Vital Signs:  Patient profile:   77 year old male Weight:      208 pounds Temp:     97.7 degrees F oral BP sitting:   104 / 56  (left arm) Cuff size:   regular  Vitals Entered By: Raechel Ache, RN (July 04, 2009 3:50 PM) CC: C/o hemorrhoid pain. Is Patient Diabetic? Yes CBG Result 284   Primary Care Provider:  Beverely Low, MD  CC:  C/o hemorrhoid pain.[name]  History of Present Illness: a 77 year old patient who presents today with the chief complaint of hemorrhoidal discomfort.  He states in 1976.  He required injection therapy for treatment of a thrombosed hemorrhoid.  He has been using a non-proprietary suppository with some benefit.  No rectal bleeding.  He has had a history of prostate cancer.  Allergies: 1)  Codeine Phosphate (Codeine Phosphate)  Past History:  Past Medical History: Reviewed history from 04/13/2009 and no changes required. Coronary artery disease Diabetes mellitus, type II Hypertension Prostate cancer, hx of Hyperlipidemia Diverticulosis, colon essential tremor thyroid nodule and goiter mild cognitive impairment diabetic peripheral neuropathy  Past Surgical History: Reviewed history from 02/08/2008 and no changes required. Coronary artery bypass graft-1988 PTCA with stent circumflex artery, February 13th 2009 colonoscopy March 2003 prostate radiation treatments-2005  Physical Exam  General:  Well-developed,well-nourished,in no acute distress; alert,appropriate and cooperative throughout examination; normal blood pressure Rectal:  a small external hemorrhoidal tag; internal exam was slightly uncomfortable but was not able to localize the discomfort   Impression & Recommendations:  Problem # 1:  INTERNAL HEMORRHOIDS (ICD-455.0) will treat with Anusol  HC and observe   Problem # 2:  PROSTATE CANCER, HX OF (ICD-V10.46) no prior history of radiation proctitis  Complete Medication List: 1)  Actonel 150 Mg  Tabs (Risedronate sodium) .... Take 1 tablet by mouth every month 2)  Aricept 10 Mg Tabs (Donepezil hcl) .... Take 1 tablet by mouth once a day 3)  Glimepiride 4 Mg Tabs (Glimepiride) .... Take 1 tablet by mouth two times a day 4)  Lipitor 10 Mg Tabs (Atorvastatin calcium) .... Take 1/2 tablet by mouth once daily 5)  Exforge 10-320 Mg Tabs (amlodipine Besylate-valsartan)  .... Take 1 tablet by mouth once a day 6)  Actos 15 Mg Tabs (Pioglitazone hcl) .... Take 1 tablet by mouth once a day 7)  Bayer Aspirin 81 Mg Tabs (aspirin)  .... Take 1 tablet by mouth once a day 8)  Nitroquick 0.4 Mg Subl (Nitroglycerin) .... Use as needed 9)  Metformin Hcl 1000 Mg Tabs (Metformin hcl) .... Take 1 tab by mouth two times a day 10)  Freestyle Lite Test Strp (Glucose blood) .... Use daily 11)  Synthroid 50 Mcg Tabs (Levothyroxine sodium) .[name].. 1 once daily 12)  Chlorthalidone 25 Mg Tabs (Chlorthalidone) .... One half tablet daily 13)  Klor-con M20 20 Meq Cr-tabs (Potassium chloride crys cr) .... One daily 14)  Ranexa 500 Mg Xr12h-tab (Ranolazine) .... One tablet by mouth once daily 15)  Metamucil Smooth Texture 58.6 % Powd (Psyllium) .[name].. 1 tbsp two times a day 16)  Metronidazole 250 Mg Tabs (Metronidazole) .[name].. 1 by mouth three times a day x 10 days 17)  Align Caps (Misc intestinal flora regulat) .... Take one capsule by mouth daily 18)  Hydrocortisone Acetate 25 Mg Supp (Hydrocortisone acetate) .... Use every 6 hours p.r.n. pain  Other Orders: Capillary Blood Glucose/CBG ([address]109)  Patient Instructions: 1)  Please schedule a follow-up appointment  as needed. Prescriptions: HYDROCORTISONE ACETATE 25 MG SUPP (HYDROCORTISONE ACETATE) use every 6 hours p.r.n. pain  #20 x 3   Entered and Authorized by:   Gordy Savers  MD   Signed by:   Gordy Savers  MD on 07/04/2009   Method used:   Print then Give to Patient   RxID:   [id number] HYDROCORTISONE ACETATE 25 MG SUPP (HYDROCORTISONE ACETATE)  use every 6 hours p.r.n. pain  #20 x 3   Entered and Authorized by:   Gordy Savers  MD   Signed by:   Gordy Savers  MD on 07/04/2009   Method used:   Electronically to        [facility] [id number]* (retail)       [address] [place], [place]  [address]       Ph: [phone]       Fax: [phone]   RxID:   [id number]

## 2010-08-02 NOTE — Assessment & Plan Note (Signed)
Summary: 3 mo rov/mm   Vital Signs:  Patient profile:   77 year old male Weight:      193 pounds Temp:     98.1 degrees F oral BP sitting:   128 / 80  (right arm) Cuff size:   regular  Vitals Entered By: Duard Brady LPN (January 18, 2010 4:25 PM) CC: 3 mos rov - doing well -   fbs 120   Primary Care Provider:  Lance Sell, MD  CC:  3 mos rov - doing well -   fbs 120.  History of Present Illness: 77 year-old patient who is seen today for follow-up of his type 2 diabetes.  Today, he feels unwell. He continues to have significant GI complaints, including occasional episodes of fecal incontinence.  He has lost weight since the first of the year with improved glycemic control.  His diabetic regimen is unclear.  Metformin is no longer on his list, but apparently he still takes this twice daily.  Today, he feels somewhat weak, and a random blood sugar was in the 50s.  Over the past couple weeks, he has used nitroglycerin on two occasions.  He has treated hypertension and coronary artery disease  Allergies: 1)  Codeine Phosphate (Codeine Phosphate)  Past History:  Past Medical History: Reviewed history from 04/13/2009 and no changes required. Coronary artery disease Diabetes mellitus, type II Hypertension Prostate cancer, hx of Hyperlipidemia Diverticulosis, colon essential tremor thyroid nodule and goiter mild cognitive impairment diabetic peripheral neuropathy  Review of Systems       The patient complains of weight loss and chest pain.  The patient denies anorexia, fever, weight gain, vision loss, decreased hearing, hoarseness, syncope, dyspnea on exertion, peripheral edema, prolonged cough, headaches, hemoptysis, abdominal pain, melena, hematochezia, severe indigestion/heartburn, hematuria, incontinence, genital sores, muscle weakness, suspicious skin lesions, transient blindness, difficulty walking, depression, unusual weight change, abnormal bleeding, enlarged lymph  nodes, angioedema, breast masses, and testicular masses.    Physical Exam  General:  Well-developed,well-nourished,in no acute distress; alert,appropriate and cooperative throughout examination; 124/76 Head:  Normocephalic and atraumatic without obvious abnormalities. No apparent alopecia or balding. Eyes:  No corneal or conjunctival inflammation noted. EOMI. Perrla. Funduscopic exam benign, without hemorrhages, exudates or papilledema. Vision grossly normal. Mouth:  Oral mucosa and oropharynx without lesions or exudates.  Teeth in good repair. Neck:  No deformities, masses, or tenderness noted. Lungs:  Normal respiratory effort, chest expands symmetrically. Lungs are clear to auscultation, no crackles or wheezes. Heart:  Normal rate and regular rhythm. S1 and S2 normal without gallop, murmur, click, rub or other extra sounds. Abdomen:  Bowel sounds positive,abdomen soft and non-tender without masses, organomegaly or hernias noted.  Diabetes Management Exam:    Eye Exam:       Eye Exam done here today          Results: normal   Impression & Recommendations:  Problem # 1:  DIABETES MELLITUS, TYPE II (ICD-250.00)  The following medications were removed from the medication list:    Glipizide 5 Mg Xr24h-tab (Glipizide) ..... One daily His updated medication list for this problem includes:    Glimepiride 4 Mg Tabs (Glimepiride) .[name]... Take 1 tablet by mouth every am his medical regimen  is unclear.  He is asked to bring all his medications in at the time of his next ROV   Problem # 2:  HYPERTENSION (ICD-401.9)  His updated medication list for this problem includes:    Chlorthalidone 25 Mg Tabs (Chlorthalidone) .Marland KitchenMarland KitchenMarland KitchenMarland Kitchen  One half tablet daily  Complete Medication List: 1)  Actonel 150 Mg Tabs (Risedronate sodium) .... Take 1 tablet by mouth every month 2)  Aricept 10 Mg Tabs (Donepezil hcl) .... Take 1 tablet by mouth once a day 3)  Glimepiride 4 Mg Tabs (Glimepiride) .... Take 1 tablet by  mouth every am 4)  Lipitor 10 Mg Tabs (Atorvastatin calcium) .... Take 1/2 tablet by mouth once daily 5)  Exforge 10-320 Mg Tabs (amlodipine Besylate-valsartan)  .... Take 1 tablet by mouth once a day 6)  Bayer Aspirin 81 Mg Tabs (aspirin)  .... Take 1 tablet by mouth once a day 7)  Nitroquick 0.4 Mg Subl (Nitroglycerin) .... Use as needed 8)  Accu-chek Advantage Test Strp (Glucose blood) .... As directed 9)  Synthroid 50 Mcg Tabs (Levothyroxine sodium) .[name].. 1 once daily 10)  Chlorthalidone 25 Mg Tabs (Chlorthalidone) .... One half tablet daily 11)  Klor-con M20 20 Meq Cr-tabs (Potassium chloride crys cr) .... One daily 12)  Ranexa 500 Mg Xr12h-tab (Ranolazine) .... One tablet by mouth once daily 13)  Bentyl 10 Mg Caps (Dicyclomine hcl) .... Take 1 tab twice daily 14)  Tramadol Hcl 50 Mg Tabs (Tramadol hcl) .[name].. 1 by mouth q 6-8 hrs as needed pain 15)  Diltiazem Gel 2%  .... Apply to rectum 4-5 times daily 16)  Zenpep 20000 Unit Cpep (Pancrelipase (lip-prot-amyl)) .... 2 tabs three times a day before meals  Patient Instructions: 1)  Please schedule a follow-up appointment in 2 months. 2)  Limit your Sodium (Salt) to less than 2 grams a day(slightly less than 1/2 a teaspoon) to prevent fluid retention, swelling, or worsening of symptoms. 3)  It is important that you exercise regularly at least 20 minutes 5 times a week. If you develop chest pain, have severe difficulty breathing, or feel very tired , stop exercising immediately and seek medical attention. 4)  You need to lose weight. Consider a lower calorie diet and regular exercise.  5)  Check your blood sugars regularly. If your readings are usually above : or below 70 you should contact our office. 6)  It is important that your Diabetic A1c level is checked every 3 months.

## 2010-08-02 NOTE — Assessment & Plan Note (Signed)
Summary: rectal pain, incontinence/sheri   History of Present Illness Visit Type: Follow-up Visit Primary GI MD: Sheryn Bison MD FACP FAG[facility] Primary Provider: Eleonore Chiquito, MD  Requesting Provider: na Chief Complaint: Rectal pain, severe back pain, loose stools, constipation, and pain in groin area. 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 He is status post prostate radiation but has had no evidence endoscopically of radiation proctitis. Colonoscopy in January of this year showed a solitary rectal ulcer. Biopsies exam no evidence of dysplasia. He also has chronic low back pain and briefly completed a CT scan of his back which was done or murmur margin basis and was apparently normal.  He continues with alternating diarrhea and constipation but denies rectal bleeding. His rectal pain is constant. As part of his workup he has been found to have a low fecal Elastase-1 level suspected of chronic pancreatic insufficiency, but treatment with pancreatic extract has not helped his chronic diarrhea nor has fiber supplements or anti-spasmodic. He currently is on regular hydrocodone and oral medications for his adult onset diabetes.  We have tried topical amino salicylates, lidocaine, topical steroids, and anti-spasmodic suite without any relief in his chronic proctalgia. Despite his complaints he's had no systemic illness such as fever, chills, anorexia weight loss. He is on Aricept 10 mg twice a day for mild memory loss, daily Lipitor, aspirin, Synthroid, and reviewing his meds it appears that he is on both a stool softener, Metamucil, and p.r.n. Imodium.   GI Review of Systems      Denies abdominal pain, acid reflux, belching, bloating, chest pain, dysphagia with liquids, dysphagia with solids, heartburn, loss of appetite, nausea, vomiting, vomiting blood, weight loss, and  weight  gain.      Reports rectal pain.     Denies anal fissure, black tarry stools, change in bowel habit, constipation, diarrhea, diverticulosis, fecal incontinence, heme positive stool, hemorrhoids, irritable bowel syndrome, jaundice, light color stool, liver problems, and  rectal bleeding.    Current Medications (verified): 1)  Aricept 10 Mg Tabs (Donepezil Hcl) .... Take 1 Tablet By Mouth Once A Day 2)  Glimepiride 4 Mg Tabs (Glimepiride) .... Take 1 Tablet By Mouth Every Am 3)  Lipitor 10 Mg Tabs (Atorvastatin Calcium) .... Take 1/2 Tablet By Mouth Once Daily 4)  Exforge 10-320 Mg  Tabs (Amlodipine Besylate-Valsartan) .... Take 1 Tablet By Mouth Once A Day 5)  Bayer Aspirin 81 Mg  Tabs (Aspirin) .... Take 1 Tablet By Mouth Once A Day 6)  Nitroquick 0.4 Mg  Subl (Nitroglycerin) .... Use As Needed 7)  Accu-Chek Advantage Test  Strp (Glucose Blood) .... As Directed 8)  Synthroid 50 Mcg Tabs (Levothyroxine Sodium) .[name].. 1 Once Daily 9)  Tramadol Hcl 50 Mg Tabs (Tramadol Hcl) .[name].. 1 By Mouth Q 6-8 Hrs As Needed Pain 10)  Zenpep 20000 Unit Cpep (Pancrelipase (Lip-Prot-Amyl)) .... 2 Tabs Three Times A Day Before Meals 11)  Boniva 150 Mg Tabs (Ibandronate Sodium) .... One Tablet By Mouth Once A Month 12)  Metformin Hcl 1000 Mg Tabs (Metformin Hcl) .... One Tablet By Mouth Once Daily 13)  Metamucil 30.9 % Powd (Psyllium) .... Once Daily 14)  Vitamin D3 1000 Unit Tabs (Cholecalciferol) .... One Tablet By Mouth Once Daily 15)  Imodium A-D 2 Mg Tabs (Loperamide Hcl) .... 1/2 Tablet By Mouth As Needed 16)  Stool Softener 250 Mg Caps (Docusate Sodium) .Marland KitchenMarland KitchenMarland Kitchen  Six Capsules By Mouth Once Daily 17)  Lidocaine Hcl 2 % Gel (Lidocaine Hcl) .... Apply To Rectum 3-4 X Daily As Needed 18)  Co-Enzyme Q-10 60 Mg Caps (Coenzyme Q10) .... Daily 19)  Hydrocodone-Acetaminophen 5-500 Mg Tabs (Hydrocodone-Acetaminophen) .[name].. 1-2 By Mouth Q 4 -6 Hours As Needed Pain-Must Last X 1 Month 20)  Aleve 220 Mg Tabs (Naproxen Sodium)  .... Will Take Three Daily As Needed For Pain 21)  Tylenol 325 Mg Tabs (Acetaminophen) .... Will Take Up To Three Daily As Needed For Pain  Allergies (verified): 1)  Codeine Phosphate (Codeine Phosphate)  Past History:  Past medical, surgical, family and social histories (including risk factors) reviewed for relevance to current acute and chronic problems.  Past Medical History: essential tremor thyroid nodule and goiter mild cognitive impairment ANAL SPASM (ICD-564.6) ULCER OF ANUS AND RECTUM (ICD-569.41) HYPOTHYROIDISM (ICD-244.9) DIABETIC PERIPHERAL NEUROPATHY (ICD-250.60) LEG CRAMPS, NOCTURNAL (ICD-729.82) GERD (ICD-530.81) IBS (ICD-564.1) IRON DEFICIENCY (ICD-280.9) INTERNAL HEMORRHOIDS (ICD-455.0) BACK PAIN (ICD-724.5) ANEMIA NEC (ICD-285.8) SYMPTOM, MALAISE AND FATIGUE NEC (ICD-780.79) DIVERTICULOSIS, COLON (ICD-562.10) HYPERLIPIDEMIA (ICD-272.4) PROSTATE CANCER, HX OF (ICD-V10.46) HYPERTENSION (ICD-401.9) DIABETES MELLITUS, TYPE II (ICD-250.00) CORONARY ARTERY DISEASE (ICD-414.00)  Past Surgical History: Reviewed history from 02/08/2008 and no changes required. Coronary artery bypass graft-1988 PTCA with stent circumflex artery, February 13th 2009 colonoscopy March 2003 prostate radiation treatments-2005  Family History: Reviewed history from 02/08/2008 and no changes required. 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  Alcohol Use - no Daily Caffeine Use Illicit Drug Use - no Patient gets regular exercise.  Review of Systems       The patient complains of back pain, anemia, depression-new, and fatigue.  The patient denies allergy/sinus, anxiety-new, arthritis/joint pain, blood in urine, breast changes/lumps, change in vision, confusion, cough, coughing up  blood, fainting, fever, headaches-new, hearing problems, heart murmur, heart rhythm changes, itching, muscle pains/cramps, night sweats, nosebleeds, shortness of breath, skin rash, sleeping problems, sore throat, swelling of feet/legs, swollen lymph glands, thirst - excessive, urination - excessive, urination changes/pain, urine leakage, vision changes, voice change, menstrual pain, pregnancy symptoms, thirst - excessive , and urination - excessive .    Vital Signs:  Patient profile:   77 year old male Height:      67 inches Weight:      190 pounds BMI:     29.87 BSA:     1.98 Pulse rate:   64 / minute Pulse rhythm:   regular BP sitting:   124 / 76  (left arm) Cuff size:   regular  Vitals Entered By: Ok Anis CMA (May 01, 2010 3:29 PM)  Physical Exam  General:  Well developed, well nourished, no acute distress.healthy appearing.  healthy appearing.   Head:  Normocephalic and atraumatic. Eyes:  PERRLA, no icterus.exam deferred to patient's ophthalmologist.  exam deferred to patient's ophthalmologist.   Psych:  Alert and cooperative. Normal mood and affect.   Impression & Recommendations:  Problem # 1:  RECTAL PAIN (BJS-283.15) Assessment Deteriorated I have ordered MRI the rectum to be sure that we are not missing some perirectal lesion. He has been evaluated previously by surgery who also could not determine any anatomical abnormalities. I have restarted Canasa 1 g suppositories at night with p.r.n. lidocaine gel. He has appointment to see Dr. Dorita Fray at [facility] for evaluation in several weeks. He may need repeat proctoscopy, and anorectal manometry. We will continue his other medications including pancreatic extracts in the interim. Orders: T-MRI Pelvis w/ or w/o contrast (75643) TLB-BMP (Basic Metabolic Panel-BMET) (80048-METABOL)  Problem # 2:  MILD COGNITIVE IMPAIRMENT SO STATED (ICD-331.83) Assessment: Unchanged  Problem # 3:  DIABETIC  PERIPHERAL NEUROPATHY (ICD-250.60) Assessment: Unchanged This rectal pain may be a manifestation of an unusual diabetic neuropathy.  Problem # 4:  IBS (ICD-564.1) Assessment: Unchanged  Problem # 5:  BACK PAIN (ICD-724.5) Assessment: Unchanged  Patient Instructions: 1)  Copy sent to : Eleonore Chiquito, MD  2)  Please go to the basement today for your labs.  3)  Your MRI is scheduled for 05/07/2010, please follow the seperate instructions.  4)  The medication list was reviewed and reconciled.  All changed / newly prescribed medications were explained.  A complete medication list was provided to the patient / caregiver. 5)  Please continue current medications.

## 2010-08-02 NOTE — Assessment & Plan Note (Signed)
Summary: 3 month rov/ssc   Vital Signs:  Patient profile:   77 year old male Weight:      188 pounds Temp:     98.0 degrees F oral BP sitting:   120 / 76  (right arm) Cuff size:   regular  Vitals Entered By: Duard Brady LPN (June 22, 2010 10:04 AM) CC: 3 mos rov - has issues to discuss with doctor Is Patient Diabetic? Yes Did you bring your meter with you today? No CBG Result 234   Primary Care Provider:  Eleonore Chiquito, MD   CC:  3 mos rov - has issues to discuss with doctor.  History of Present Illness: 77 year old patient who is seen today for follow up of his type 2 diabetes.  He has been out of his test strips  and is unclear of his glycemic control.  His hemoglobin A1c3 months ago, was 7.2.  He continues to have significant GI issues and has been evaluated at [facility] and is to start biofeedback for her pelvic floor dyssyngia.  He continues to have significant rectal and perineal pain.  He also has diarrhea, but alternates with constipation.  Medical regimen does include hydrocodone that has been quite helpful.  He has treated hypertension and coronary artery disease.  In October.  He was hospitalized for atypical chest pain and has been stable from a cardiac standpoint.  There has been considerable situational stress due to the help of his wife, who is being treated for frequent TIAs.  He is have a follow-up appointment with neurology due to his dementia and is scheduled for further neuropsychiatric evaluation. hospital records, and neurology consultation notes reviewed  Allergies: 1)  Codeine Phosphate (Codeine Phosphate)  Past History:  Past Medical History: essential tremor thyroid nodule and goiter mild cognitive impairment ANAL SPASM (ICD-564.6) ULCER OF ANUS AND RECTUM (ICD-569.41) HYPOTHYROIDISM (ICD-244.9) DIABETIC PERIPHERAL NEUROPATHY (ICD-250.60) LEG CRAMPS, NOCTURNAL (ICD-729.82) GERD (ICD-530.81) IBS (ICD-564.1) IRON DEFICIENCY  (ICD-280.9) INTERNAL HEMORRHOIDS (ICD-455.0) BACK PAIN (ICD-724.5) ANEMIA NEC (ICD-285.8) SYMPTOM, MALAISE AND FATIGUE NEC (ICD-780.79) DIVERTICULOSIS, COLON (ICD-562.10) HYPERLIPIDEMIA (ICD-272.4) PROSTATE CANCER, HX OF (ICD-V10.46) HYPERTENSION (ICD-401.9) DIABETES MELLITUS, TYPE II (ICD-250.00) CORONARY ARTERY DISEASE (ICD-414.00) PELVID FLOOR DYSSYNGIA  Family History: Reviewed history from 02/08/2008 and no changes required. father died age 29, complications  of a stroke and pneumonia mother died age 52  No siblings No FH of Colon Cancer:  Social History: Reviewed history from 05/01/2010 and no changes required. 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  Alcohol Use - no Daily Caffeine Use Illicit Drug Use - no Patient gets regular exercise.  Review of Systems       The patient complains of abdominal pain.  The patient denies anorexia, fever, weight loss, weight gain, vision loss, decreased hearing, hoarseness, chest pain, syncope, dyspnea on exertion, peripheral edema, prolonged cough, headaches, hemoptysis, melena, hematochezia, severe indigestion/heartburn, hematuria, incontinence, genital sores, muscle weakness, suspicious skin lesions, transient blindness, difficulty walking, depression, unusual weight change, abnormal bleeding, enlarged lymph nodes, angioedema, breast masses, and testicular masses.    Physical Exam  General:  overweight-appearing.   normal.  Blood pressureoverweight-appearing.   Head:  Normocephalic and atraumatic without obvious abnormalities. No apparent alopecia or balding. Eyes:  No corneal or conjunctival inflammation noted. EOMI. Perrla. Funduscopic exam benign, without hemorrhages, exudates or papilledema. Vision grossly normal. Mouth:  Oral mucosa and oropharynx without lesions or exudates.  Teeth in good repair. Neck:  No deformities, masses, or tenderness noted. Lungs:  Normal respiratory effort,  chest expands symmetrically. Lungs are  clear to auscultation, no crackles or wheezes. Heart:  Normal rate and regular rhythm. S1 and S2 normal without gallop, murmur, click, rub or other extra sounds. Abdomen:  Bowel sounds positive,abdomen soft and non-tender without masses, organomegaly or hernias noted. Msk:  No deformity or scoliosis noted of thoracic or lumbar spine.   Extremities:  No clubbing, cyanosis, edema, or deformity noted with normal full range of motion of all joints.     Impression & Recommendations:  Problem # 1:  RECTAL PAIN (ZOX-096.04)  Problem # 2:  HYPERTENSION (ICD-401.9)  Problem # 3:  DIABETES MELLITUS, TYPE II (ICD-250.00)  His updated medication list for this problem includes:    Glimepiride 4 Mg Tabs (Glimepiride) .[name]... Take 1/2 tablet by mouth every am    Metformin Hcl 1000 Mg Tabs (Metformin hcl) ..... One tablet by mouth once daily  Orders: Capillary Blood Glucose/CBG (54098) Venipuncture (11914) TLB-A1C / Hgb A1C (Glycohemoglobin) (83036-A1C) Specimen Handling (78295)  His updated medication list for this problem includes:    Glimepiride 4 Mg Tabs (Glimepiride) .[name]... Take 1/2 tablet by mouth every am    Metformin Hcl 1000 Mg Tabs (Metformin hcl) ..... One tablet by mouth once daily  Complete Medication List: 1)  Aricept 10 Mg Tabs (Donepezil hcl) .... Take 1 tablet by mouth once a day 2)  Glimepiride 4 Mg Tabs (Glimepiride) .... Take 1/2 tablet by mouth every am 3)  Lipitor 10 Mg Tabs (Atorvastatin calcium) .... Take 1/2 tablet by mouth once daily 4)  Exforge 10-320 Mg Tabs (amlodipine Besylate-valsartan)  .... Take 1 tablet by mouth once a day 5)  Bayer Aspirin 81 Mg Tabs (aspirin)  .... Take 1 tablet by mouth once a day 6)  Nitroquick 0.4 Mg Subl (Nitroglycerin) .... Use as needed 7)  Accu-chek Advantage Test Strp (Glucose blood) .... As directed 8)  Synthroid 50 Mcg Tabs (Levothyroxine sodium) .[name].. 1 once daily 9)  Tramadol Hcl 50 Mg Tabs (Tramadol hcl) .[name].. 1 by mouth q 6-8 hrs as  needed pain 10)  Zenpep 20000 Unit Cpep (Pancrelipase (lip-prot-amyl)) .... 2 tabs three times a day before meals 11)  Boniva 150 Mg Tabs (Ibandronate sodium) .... One tablet by mouth once a month 12)  Metformin Hcl 1000 Mg Tabs (Metformin hcl) .... One tablet by mouth once daily 13)  Metamucil 30.9 % Powd (Psyllium) .... Once daily 14)  Vitamin D3 1000 Unit Tabs (Cholecalciferol) .... One tablet by mouth once daily 15)  Imodium A-d 2 Mg Tabs (Loperamide hcl) .... 1/2 tablet by mouth as needed 16)  Stool Softener 250 Mg Caps (Docusate sodium) .... Six capsules by mouth once daily 17)  Lidocaine Hcl 2 % Gel (Lidocaine hcl) .... Apply to rectum 3-4 x daily as needed 18)  Co-enzyme Q-10 60 Mg Caps (Coenzyme q10) .... Daily 19)  Nucynta 100 Mg Tabs (Tapentadol hcl) .... Take one by mouth two times a day as needed 20)  Aleve 220 Mg Tabs (Naproxen sodium) .... Will take three daily as needed for pain 21)  Tylenol 325 Mg Tabs (Acetaminophen) .... Will take up to three daily as needed for pain  Patient Instructions: 1)  Please schedule a follow-up appointment in 3 months. 2)  Limit your Sodium (Salt) to less than 2 grams a day(slightly less than 1/2 a teaspoon) to prevent fluid retention, swelling, or worsening of symptoms. 3)  It is important that you exercise regularly at least 20 minutes 5 times a week.  If you develop chest pain, have severe difficulty breathing, or feel very tired , stop exercising immediately and seek medical attention. 4)  You need to lose weight. Consider a lower calorie diet and regular exercise.  5)  Check your blood sugars regularly. If your readings are usually above : or below 70 you should contact our office. 6)  It is important that your Diabetic A1c level is checked every 3 months. 7)  See your eye doctor yearly to check for diabetic eye damage. Prescriptions: METFORMIN HCL 1000 MG TABS (METFORMIN HCL) one tablet by mouth once daily  #180 x 6   Entered and Authorized  by:   Gordy Savers  MD   Signed by:   Gordy Savers  MD on 06/22/2010   Method used:   Electronically to        [facility] #[id number]* (retail)       [address] [place], [place]  [address]       Ph: [phone]       Fax: [phone]   RxID:   [id number] BONIVA 150 MG TABS (IBANDRONATE SODIUM) one tablet by mouth once a month  #3 x 6   Entered and Authorized by:   Gordy Savers  MD   Signed by:   Gordy Savers  MD on 06/22/2010   Method used:   Electronically to        [facility] #[id number]* (retail)       [address] [place], [place]  [address]       Ph: [phone]       Fax: [phone]   RxID:   [id number] SYNTHROID 50 MCG TABS (LEVOTHYROXINE SODIUM) 1 once daily  #90 x 6   Entered and Authorized by:   Gordy Savers  MD   Signed by:   Gordy Savers  MD on 06/22/2010   Method used:   Electronically to        [id number]* (retail)       [address] [place], [place]  [address]       Ph: [phone]       Fax: [phone]   RxID:   [id number] ACCU-CHEK ADVANTAGE TEST  STRP (GLUCOSE BLOOD) As directed  #90 x 6   Entered and Authorized by:   Gordy Savers  MD   Signed by:   Gordy Savers  MD on 06/22/2010   Method used:   Electronically to        [id number]* (retail)       [address] [place], [place]  [address]       Ph: [phone]       Fax: [phone]   RxID:   [id number] LIPITOR 10 MG TABS (ATORVASTATIN CALCIUM) Take 1/2 tablet by mouth once daily  #90 x 6   Entered and Authorized by:   Gordy Savers  MD   Signed by:   Gordy Savers  MD on 06/22/2010   Method used:   Electronically to        [id number]* (retail)       [address] [place], [place]  [address]       Ph: [phone]  Fax: [phone]   RxID:   [id number] GLIMEPIRIDE 4 MG TABS (GLIMEPIRIDE) Take 1/2  tablet by mouth every am  #90 x 6   Entered and Authorized by:   Gordy Savers  MD   Signed by:   Gordy Savers  MD on 06/22/2010   Method used:   Electronically to        [id number]* (retail)       [address] [place], [place]  [address]       Ph: [phone]       Fax: [phone]   RxID:   [id number] ARICEPT 10 MG TABS (DONEPEZIL HCL) Take 1 tablet by mouth once a day  #90 Tablet x 6   Entered and Authorized by:   Gordy Savers  MD   Signed by:   Gordy Savers  MD on 06/22/2010   Method used:   Electronically to        [id number]* (retail)       [address] [place], [place]  [address]       Ph: [phone]       Fax: [phone]   RxID:   [id number]    Orders Added: 1)  Capillary Blood Glucose/CBG [82948] 2)  Venipuncture [48546] 3)  TLB-A1C / Hgb A1C (Glycohemoglobin) [83036-A1C] 4)  Est. Patient Level IV [27[address]] 5)  Specimen Handling [99000]

## 2010-08-02 NOTE — Progress Notes (Signed)
Summary: fyi  Phone Note Call from Patient Call back at [phone]   Caller: Patient Call For: Gordy Savers  MD Summary of Call: pt is going to er for lower back pain. pt stated he does not want ov with dr Kirtland Bouchard Initial call taken by: Heron Sabins,  April 19, 2010 9:20 AM  Follow-up for Phone Call        noted Follow-up by: Gordy Savers  MD,  April 19, 2010 11:01 AM

## 2010-08-02 NOTE — Progress Notes (Signed)
Summary: Amitiza not covered.   Phone Note From Pharmacy   Summary of Call: Received fax from [facility], Valinda Hoar is not on their formulary.  Asking if appropriate to change pt to Lactulose or Polyethylene Glycol? Initial call taken by: Ashok Cordia RN,  August 18, 2009 2:37 PM  Follow-up for Phone Call        [facility] [place]...DID AMITIZA WORK....DRP Follow-up by: Mardella Layman MD FACG,  August 18, 2009 2:57 PM  Additional Follow-up for Phone Call Additional follow up Details #1::        Talked with pt.  Amitiza has not been a huge help for pt.  Has taken this and miralax.  Pt has about a month's worth of amitiza.  He will use these and then decide if it is something he feels he needs to stay on. Pt informed that he can try the miralax alone and see if this is helpful.  He will report back.   Additional Follow-up by: Ashok Cordia RN,  August 18, 2009 3:08 PM

## 2010-08-02 NOTE — Letter (Signed)
Summary: Patient Notice- Polyp Results  [name]  [address] [place], [place] [address]   Phone: [phone]  Fax: [phone]        July 26, 2009 MRN: [id number]    RANDY CASTREJON [address] [place], [place]  [address]    Dear Mr. FUSSELL,  I am pleased to inform you that the colon polyp(s) removed during your recent colonoscopy was (were) found to be benign (no cancer detected) upon pathologic examination.  I recommend you have a repeat colonoscopy examination in 5_ years to look for recurrent polyps, as having colon polyps increases your risk for having recurrent polyps or even colon cancer in the future.  Should you develop new or worsening symptoms of abdominal pain, bowel habit changes or bleeding from the rectum or bowels, please schedule an evaluation with either your primary care physician or with me.  Additional information/recommendations:  __ No further action with gastroenterology is needed at this time. Please      follow-up with your primary care physician for your other healthcare      needs.  _X_ Please call [phone] to schedule a return visit to review your      situation.SEE ME 2-3 WEEKS PLEASE...  __ Please keep your follow-up visit as already scheduled.  XX__ Continue treatment plan as outlined the day of your exam.  Please call us if you are having persistent problems or have questions about your condition that have not been fully answered at this time.  Sincerely,  Mardella Layman MD [facility]  This letter has been electronically signed by your physician.  Appended Document: Patient Notice- Polyp Results Letter mailed 1.28.11

## 2010-08-02 NOTE — Progress Notes (Signed)
Summary: mental deterioration  Phone Note Call from Patient   Caller: [name] [name],daughter,[phone] Summary of Call: Concern about deterioration of his mental capabilities & functions,  decrease memory & mental functions. 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   What to do?  Next step?  What should we do?   Initial call taken by: Rudy Jew, RN,  February 23, 2010 3:28 PM  Follow-up for Phone Call        rov next week to add additional meds and refer to neurology if desired Follow-up by: Gordy Savers  MD,  February 23, 2010 4:08 PM  Additional Follow-up for Phone Call Additional follow up Details #1::        Phone Call Completed Additional Follow-up by: Rudy Jew, RN,  February 23, 2010 4:32 PM

## 2010-08-02 NOTE — Progress Notes (Signed)
Summary: Fax recieved  Phone Note Call from Patient Call back at Home Phone [phone]   Caller: Patient Reason for Call: Talk to Nurse Summary of Call: Patient wants to know if Dr. Kirtland Bouchard has received his fax?  Patient sent it at 1:15pm today Initial call taken by: Everrett Coombe,  September 25, 2009 3:33 PM  Follow-up for Phone Call        attempt to call to let him know we did rec. fax - unavailb. at wk # left. KIK  Follow-up by: Duard Brady LPN,  September 25, 2009 4:33 PM     Appended Document: Fax recieved    Phone Note Call from Patient   Caller: Patient Call For: Gordy Savers  MD Reason for Call: Talk to Doctor Summary of Call: wanted to know if we rec'd fax r/t his blood sugars - call back at [phone] .  Follow-up for Phone Call        I returned call - spoke with pt - informed that we did rec' fax and it was given to Dr. Amador Cunas for review. pt states that Blood sugar was 240 this AM. I will forward infor to Dr. and wait to be advised. KIK Follow-up by: Duard Brady LPN,  September 26, 2009 12:56 PM  Additional Follow-up for Phone Call Additional follow up Details #1::        called and discussed Additional Follow-up by: Gordy Savers  MD,  September 26, 2009 2:06 PM

## 2010-08-06 ENCOUNTER — Ambulatory Visit (INDEPENDENT_AMBULATORY_CARE_PROVIDER_SITE_OTHER): Payer: Medicare Other | Admitting: Internal Medicine

## 2010-08-06 ENCOUNTER — Encounter: Payer: Self-pay | Admitting: Internal Medicine

## 2010-08-06 VITALS — BP 138/90 | Temp 97.6°F | Ht 66.5 in | Wt 184.0 lb

## 2010-08-06 DIAGNOSIS — M79605 Pain in left leg: Secondary | ICD-10-CM

## 2010-08-06 DIAGNOSIS — M79609 Pain in unspecified limb: Secondary | ICD-10-CM

## 2010-08-06 MED ORDER — GLIMEPIRIDE 4 MG PO TABS: 4.0000 mg | ORAL_TABLET | Freq: Every day | ORAL | Status: DC

## 2010-08-06 NOTE — Patient Instructions (Signed)
Please check your hemoglobin A1c every 3 months Pain meds as directed   Call or return to clinic prn if these symptoms worsen or fail to improve as anticipated.

## 2010-08-06 NOTE — Progress Notes (Signed)
  Subjective:    Patient ID: [name] [name], male    DOB: 1929/08/27, 77 y.o.   MRN: [id number]  HPI  77 year old patient who has a history of type 2 diabetes.  He has a history of pelvic floor dyssyergia  And now is being followed at [facility] for biofeedback.  He continues to have rectal and perineal pain.  For the past 4 weeks He has had left leg pain.  This is maximal in the high posterior leg with radiation down to the knee.  Pain is aggravated by movement, but often is a more continuous type pain.  He has a history of type 2 diabetes.  A single A1c in December was 6.1.  Random blood sugar today 323.  He has diabetes conative impairment, dyslipidemia   Review of Systems  Constitutional: Negative for fever, chills, appetite change and fatigue.  HENT: Negative for hearing loss, ear pain, congestion, sore throat, trouble swallowing, neck stiffness, dental problem, voice change and tinnitus.   Eyes: Negative for pain, discharge and visual disturbance.  Respiratory: Negative for cough, chest tightness, wheezing and stridor.   Cardiovascular: Negative for chest pain, palpitations and leg swelling.  Gastrointestinal: Negative for nausea, vomiting, abdominal pain, diarrhea, constipation, blood in stool and abdominal distention.  Genitourinary: Negative for urgency, hematuria, flank pain, discharge, difficulty urinating and genital sores.  Musculoskeletal: Negative for myalgias, back pain, joint swelling, arthralgias and gait problem.  Skin: Negative for rash.  Neurological: Negative for dizziness, syncope, speech difficulty, weakness, numbness and headaches.  Hematological: Negative for adenopathy. Does not bruise/bleed easily.  Psychiatric/Behavioral: Negative for behavioral problems and dysphoric mood. The patient is not nervous/anxious.        Objective:   Physical Exam  Vitals reviewed. Constitutional: He is oriented to person, place, and time. He appears well-developed.  HENT:  Head:  Normocephalic.  Right Ear: External ear normal.  Left Ear: External ear normal.  Eyes: Conjunctivae and EOM are normal.  Neck: Normal range of motion.  Cardiovascular: Normal rate and normal heart sounds.   Pulmonary/Chest: Breath sounds normal.  Abdominal: Bowel sounds are normal.  Musculoskeletal: Normal range of motion. He exhibits no edema and no tenderness.  Neurological: He is alert and oriented to person, place, and time. He has normal reflexes.       Straight leg testing and flexion of the hip because of pain in the posterior thigh region.  This area also same tender to palpation.  Range of motion of the hip also tended to cause minimal discomfort; vibratory sensation was diminished distally  Psychiatric: He has a normal mood and affect. His behavior is normal.          Assessment & Plan:  Left leg pain- this appears of a more musculoligamentous.   Doubt Radicular  pain or related to his diabetes Diabetes mellitus- Will increase Amaryl to 4 mg daily Hypertension- Continue present medications

## 2010-08-08 ENCOUNTER — Telehealth: Payer: Self-pay | Admitting: *Deleted

## 2010-08-08 NOTE — Telephone Encounter (Signed)
How much Amaryl does pt take??

## 2010-08-08 NOTE — Progress Notes (Signed)
Summary: Medication  Phone Note Call from Patient Call back at Home Phone [phone]   Caller: Patient Call For: Dr. Jarold Motto Reason for Call: Talk to Nurse Summary of Call: Pt needs a refill on HYDROCODONE to the [facility], wants to know why it is being denied when he has refills on the bottle Initial call taken by: [place] Johnson,  August 01, 2010 2:41 PM  Follow-up for Phone Call        hydrocodone rx was changed to Nucynta per Dr. Jarold Motto. pt advised and pt will pick up rx today or tomorrow.  Follow-up by: Harlow Mares CMA (AAMA),  August 01, 2010 2:57 PM    New/Updated Medications: NUCYNTA 100 MG TABS (TAPENTADOL HCL) take one by mouth two times a day as needed Prescriptions: NUCYNTA 100 MG TABS (TAPENTADOL HCL) take one by mouth two times a day as needed  #60 x 0   Entered by:   Harlow Mares CMA (AAMA)   Authorized by:   Mardella Layman MD [facility]   Signed by:   Harlow Mares CMA (AAMA) on 08/01/2010   Method used:   Print then Give to Patient   RxID:   [id number]

## 2010-08-09 NOTE — Telephone Encounter (Signed)
Amaryl 4 mg every morning

## 2010-08-23 ENCOUNTER — Encounter: Payer: Self-pay | Admitting: Internal Medicine

## 2010-08-23 ENCOUNTER — Ambulatory Visit (INDEPENDENT_AMBULATORY_CARE_PROVIDER_SITE_OTHER): Payer: Medicare Other | Admitting: Internal Medicine

## 2010-08-23 DIAGNOSIS — M549 Dorsalgia, unspecified: Secondary | ICD-10-CM

## 2010-08-23 DIAGNOSIS — E119 Type 2 diabetes mellitus without complications: Secondary | ICD-10-CM

## 2010-08-23 DIAGNOSIS — R198 Other specified symptoms and signs involving the digestive system and abdomen: Secondary | ICD-10-CM

## 2010-08-23 NOTE — Patient Instructions (Signed)
MRI  AS SCHEDULED Return in one month for follow-up  miralax one scoop daily

## 2010-08-23 NOTE — Progress Notes (Signed)
Subjective:    Patient ID: [name] [name], male    DOB: [birth year], 77 y.o.   MRN: [id number]  HPI   77 year old patient who is seen today for followup. He has type 2 diabetes. He also has been bothered by pelvic floor dyssynergia and now has considerable left leg pain he still has considerable pain in the rectal penile and scrotal regions as well. This seems to be an extension of his more chronic problem which has worsened and now involves the left leg denies any motor weakness he has had a pelvic MRI that was unremarkable. There is some concern this may be a unusual diabetic neuropathic issue as well he does have a history of a mild diabetic peripheral neuropathy he is now on narcotics and still quite uncomfortable. He is seen today as a walk-in. He does have a history of prostate cancer and prior radiation treatment but apparently no history of proctitis   Review of Systems  Constitutional: Negative for fever, chills, appetite change and fatigue.  HENT: Negative for hearing loss, ear pain, congestion, sore throat, trouble swallowing, neck stiffness, dental problem, voice change and tinnitus.   Eyes: Negative for pain, discharge and visual disturbance.  Respiratory: Negative for cough, chest tightness, wheezing and stridor.   Cardiovascular: Negative for chest pain, palpitations and leg swelling.  Gastrointestinal: Negative for nausea, vomiting, abdominal pain, diarrhea, constipation, blood in stool and abdominal distention.  Genitourinary: Negative for urgency, hematuria, flank pain, discharge, difficulty urinating and genital sores.  Musculoskeletal: Positive for back pain. Negative for myalgias, joint swelling, arthralgias and gait problem.  Skin: Negative for rash.  Neurological: Negative for dizziness, syncope, speech difficulty, weakness, numbness and headaches.  Hematological: Negative for adenopathy. Does not bruise/bleed easily.  Psychiatric/Behavioral: Positive for decreased  concentration. Negative for behavioral problems and dysphoric mood. The patient is not nervous/anxious.        Objective:   Physical Exam  Constitutional: He is oriented to person, place, and time. He appears well-developed.  HENT:  Head: Normocephalic.  Right Ear: External ear normal.  Left Ear: External ear normal.  Eyes: Conjunctivae and EOM are normal.  Neck: Normal range of motion.  Cardiovascular: Normal rate and normal heart sounds.   Pulmonary/Chest: Breath sounds normal.  Abdominal: Bowel sounds are normal.  Musculoskeletal: Normal range of motion. He exhibits no edema and no tenderness.  Neurological: He is alert and oriented to person, place, and time.        Straight leg test on the left does aggravate his left leg pain able to walk on his toes and heels without motor weakness. Does tend to fall backwards with standing on his heels  Psychiatric: He has a normal mood and affect. His behavior is normal.          Assessment & Plan:   pelvic floor dyssynergia. Left leg pain is probably an extension of this process he has had a negative pelvic MRI. We'll proceed with a lumbar MRI to rule out pathology here. He is on narcotic analgesics and he was encouraged to take more liberally . He is having some constipation issues and he states he is taking stool softeners to 3 times daily he was told to start with MiraLax one scoop in 8 ounces of water daily and to titrate as necessary. We'll schedule an MRI and see in one month for followup. A hemoglobin A1c will be checked at that time. 2 months ago this was 6.1. Blood sugars have been much more labile

## 2010-09-04 ENCOUNTER — Other Ambulatory Visit: Payer: Medicare Other

## 2010-09-07 ENCOUNTER — Ambulatory Visit
Admission: RE | Admit: 2010-09-07 | Discharge: 2010-09-07 | Disposition: A | Discharge status: Home / Self Care | Payer: Medicare Other | Source: Ambulatory Visit | Attending: Internal Medicine | Admitting: Internal Medicine

## 2010-09-07 DIAGNOSIS — R198 Other specified symptoms and signs involving the digestive system and abdomen: Secondary | ICD-10-CM

## 2010-09-07 DIAGNOSIS — E119 Type 2 diabetes mellitus without complications: Secondary | ICD-10-CM

## 2010-09-07 DIAGNOSIS — M549 Dorsalgia, unspecified: Secondary | ICD-10-CM

## 2010-09-12 LAB — GLUCOSE, CAPILLARY
Glucose-Capillary: 148 mg/dL — ABNORMAL HIGH (ref 70–99)
Glucose-Capillary: 195 mg/dL — ABNORMAL HIGH (ref 70–99)
Glucose-Capillary: 250 mg/dL — ABNORMAL HIGH (ref 70–99)
Glucose-Capillary: 69 mg/dL — ABNORMAL LOW (ref 70–99)

## 2010-09-12 LAB — URINALYSIS, ROUTINE W REFLEX MICROSCOPIC
Bilirubin Urine: NEGATIVE
Glucose, UA: NEGATIVE mg/dL
Hgb urine dipstick: NEGATIVE
Ketones, ur: NEGATIVE mg/dL
Nitrite: NEGATIVE
Protein, ur: NEGATIVE mg/dL
Specific Gravity, Urine: 1.01 (ref 1.005–1.030)
Urobilinogen, UA: 0.2 mg/dL (ref 0.0–1.0)
pH: 7 (ref 5.0–8.0)

## 2010-09-12 LAB — BASIC METABOLIC PANEL WITH GFR
BUN: 15 mg/dL (ref 6–23)
CO2: 27 meq/L (ref 19–32)
Calcium: 8.8 mg/dL (ref 8.4–10.5)
Chloride: 108 meq/L (ref 96–112)
Creatinine, Ser: 1.45 mg/dL (ref 0.4–1.5)
GFR calc non Af Amer: 47 mL/min — ABNORMAL LOW
Glucose, Bld: 141 mg/dL — ABNORMAL HIGH (ref 70–99)
Potassium: 4 meq/L (ref 3.5–5.1)
Sodium: 140 meq/L (ref 135–145)

## 2010-09-12 LAB — DIFFERENTIAL
Eosinophils Absolute: 0.1 10*3/uL (ref 0.0–0.7)
Eosinophils Relative: 3 % (ref 0–5)
Lymphocytes Relative: 19 % (ref 12–46)
Lymphs Abs: 1 10*3/uL (ref 0.7–4.0)
Monocytes Relative: 13 % — ABNORMAL HIGH (ref 3–12)

## 2010-09-12 LAB — CBC
HCT: 33.7 % — ABNORMAL LOW (ref 39.0–52.0)
HCT: 35.5 % — ABNORMAL LOW (ref 39.0–52.0)
Hemoglobin: 11.6 g/dL — ABNORMAL LOW (ref 13.0–17.0)
Hemoglobin: 12.1 g/dL — ABNORMAL LOW (ref 13.0–17.0)
MCH: 31.5 pg (ref 26.0–34.0)
MCH: 32.2 pg (ref 26.0–34.0)
MCHC: 34.1 g/dL (ref 30.0–36.0)
MCV: 91.6 fL (ref 78.0–100.0)
MCV: 94.4 fL (ref 78.0–100.0)
Platelets: 140 10*3/uL — ABNORMAL LOW (ref 150–400)
Platelets: 155 10*3/uL (ref 150–400)
RBC: 3.68 MIL/uL — ABNORMAL LOW (ref 4.22–5.81)
RBC: 3.76 MIL/uL — ABNORMAL LOW (ref 4.22–5.81)
RDW: 14.4 % (ref 11.5–15.5)
WBC: 4.9 10*3/uL (ref 4.0–10.5)
WBC: 5.7 10*3/uL (ref 4.0–10.5)

## 2010-09-12 LAB — POCT CARDIAC MARKERS
CKMB, poc: <1 ng/mL — ABNORMAL LOW (ref 1.0–8.0)
Myoglobin, poc: 68.1 ng/mL (ref 12–200)
Troponin i, poc: <0.05 ng/mL (ref 0.00–0.09)

## 2010-09-12 LAB — BASIC METABOLIC PANEL
CO2: 27 mEq/L (ref 19–32)
Chloride: 109 mEq/L (ref 96–112)
Creatinine, Ser: 1.45 mg/dL (ref 0.4–1.5)
GFR calc Af Amer: 57 mL/min — ABNORMAL LOW (ref >60)
Potassium: 4 mEq/L (ref 3.5–5.1)
Sodium: 140 mEq/L (ref 135–145)

## 2010-09-12 LAB — CK TOTAL AND CKMB (NOT AT ARMC)
CK, MB: 1.4 ng/mL (ref 0.3–4.0)
CK, MB: 1.7 ng/mL (ref 0.3–4.0)
Relative Index: INVALID (ref 0.0–2.5)
Relative Index: INVALID (ref 0.0–2.5)
Total CK: 46 U/L (ref 7–232)
Total CK: 48 U/L (ref 7–232)

## 2010-09-12 LAB — CARDIAC PANEL(CRET KIN+CKTOT+MB+TROPI)
CK, MB: 1.4 ng/mL (ref 0.3–4.0)
Relative Index: INVALID (ref 0.0–2.5)
Total CK: 58 U/L (ref 7–232)

## 2010-09-12 LAB — TROPONIN I

## 2010-09-16 LAB — GLUCOSE, CAPILLARY: Glucose-Capillary: 136 mg/dL — ABNORMAL HIGH (ref 70–99)

## 2010-09-21 ENCOUNTER — Encounter: Payer: Self-pay | Admitting: Internal Medicine

## 2010-09-21 ENCOUNTER — Ambulatory Visit (INDEPENDENT_AMBULATORY_CARE_PROVIDER_SITE_OTHER): Payer: Medicare Other | Admitting: Internal Medicine

## 2010-09-21 VITALS — BP 120/70 | Temp 97.4°F | Wt 178.0 lb

## 2010-09-21 DIAGNOSIS — I10 Essential (primary) hypertension: Secondary | ICD-10-CM

## 2010-09-21 DIAGNOSIS — E119 Type 2 diabetes mellitus without complications: Secondary | ICD-10-CM

## 2010-09-21 DIAGNOSIS — R198 Other specified symptoms and signs involving the digestive system and abdomen: Secondary | ICD-10-CM

## 2010-09-21 DIAGNOSIS — K6289 Other specified diseases of anus and rectum: Secondary | ICD-10-CM

## 2010-09-21 MED ORDER — TAPENTADOL HCL 100 MG PO TABS: 1.0000 | ORAL_TABLET | Freq: Two times a day (BID) | ORAL | Status: DC | PRN

## 2010-09-21 MED ORDER — ATORVASTATIN CALCIUM 10 MG PO TABS: 10.0000 mg | ORAL_TABLET | Freq: Every day | ORAL | Status: DC

## 2010-09-21 NOTE — Progress Notes (Signed)
Subjective:    Patient ID: [name] [name], male    DOB: 04/08/1930, 77 y.o.   MRN: [id number]  HPI   77 year old patient who is seen today for followup of his diabetes. His hemoglobin A1c 3 months ago was 6.1. He continues to have severe rectal pain with radiation to the groin and the left leg. This has been a problem since January of 2011 he has been evaluated extensively by GI general surgery and has been referred to [facility] and has been given a diagnosis of recto- sphincteric dyssynergia. His pain has been refractory to multiple modalities including biofeedback. He has had a recent lumbar MRI due to the left leg pain that was nonrevealing he did have a MRI of the pelvis in November of 2011 he had a bone scan performed in October of 2011. He does have a history of prostate cancer status post radiotherapy but no history of proctitis. A rectal ulcer was diagnosed in January of 2011 when he first presented with fairly localized proctalgia.  He was seen by neurology yesterday. There was some concern about a possible pelvic fracture. He has fallen twice in the past 2 years but not in the past 9 or 12 months.   Chief Complaint: Rectal pain, severe back pain, loose stools, constipation, and pain in groin area. 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 He is status post prostate radiation but has had no evidence endoscopically of radiation proctitis. Colonoscopy in January of this year showed a solitary rectal ulcer. Biopsies exam no evidence of dysplasia. He also has chronic low back pain and briefly completed a CT scan of his back which was done or murmur margin basis and was apparently normal.  He continues with alternating diarrhea and constipation but denies rectal bleeding. His rectal pain is constant. As part of his workup he has been found to have a low fecal Elastase-1 level  suspected of chronic pancreatic insufficiency, but treatment with pancreatic extract has not helped his chronic diarrhea nor has fiber supplements or anti-spasmodic. He currently is on regular hydrocodone and oral medications for his adult onset diabetes.  We have tried topical amino salicylates, lidocaine, topical steroids, and anti-spasmodic suite without any relief in his chronic proctalgia. Despite his complaints he's had no systemic illness such as fever, chills, anorexia weight loss. He is on Aricept 10 mg twice a day for mild memory loss, daily Lipitor, aspirin, Synthroid, and reviewing his meds it appears that he is on both a stool softener, Metamucil, and p.r.n. Imodium.      Review of Systems  Constitutional: Negative for fever, chills, appetite change and fatigue.  HENT: Negative for hearing loss, ear pain, congestion, sore throat, trouble swallowing, neck stiffness, dental problem, voice change and tinnitus.   Eyes: Negative for pain, discharge and visual disturbance.  Respiratory: Negative for cough, chest tightness, wheezing and stridor.   Cardiovascular: Negative for chest pain, palpitations and leg swelling.  Gastrointestinal: Positive for rectal pain. Negative for nausea, vomiting, abdominal pain, diarrhea, constipation, blood in stool and abdominal distention.  Genitourinary: Positive for genital sores and testicular pain. Negative for urgency, hematuria, flank pain, discharge and difficulty urinating.  Musculoskeletal: Negative for myalgias, back pain, joint swelling, arthralgias and gait problem.  Skin: Negative for rash.  Neurological: Negative for dizziness, syncope, speech difficulty, weakness, numbness and headaches.  Hematological: Negative for adenopathy. Does not bruise/bleed easily.  Psychiatric/Behavioral: Negative for behavioral problems and dysphoric mood. The patient is not nervous/anxious.        Objective:   Physical Exam  Constitutional: He is oriented to  person, place, and time. He appears well-developed and well-nourished.        Uncomfortable with increasing rectal pain with sitting  HENT:  Head: Normocephalic.  Right Ear: External ear normal.  Left Ear: External ear normal.  Eyes: Conjunctivae and EOM are normal. Pupils are equal, round, and reactive to light.  Neck: Normal range of motion.  Cardiovascular: Normal rate, regular rhythm and normal heart sounds.   Pulmonary/Chest: Breath sounds normal.  Abdominal: Bowel sounds are normal.  Musculoskeletal: Normal range of motion. He exhibits no edema and no tenderness.  Neurological: He is alert and oriented to person, place, and time.  Psychiatric: He has a normal mood and affect. His behavior is normal.          Assessment & Plan:   diabetes mellitus. 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  We'll continue when necessary analgesia only and hold off on more continuous analgesic therapy.

## 2010-09-21 NOTE — Patient Instructions (Signed)
Please check your hemoglobin A1c every 3 months  Limit your sodium (Salt) intake Call or return to clinic prn if these symptoms worsen or fail to improve as anticipated.    

## 2010-09-22 LAB — HEMOGLOBIN A1C
Hgb A1c MFr Bld: 7.2 % — ABNORMAL HIGH (ref <5.7)
Mean Plasma Glucose: 160 mg/dL — ABNORMAL HIGH (ref <117)

## 2010-09-24 ENCOUNTER — Other Ambulatory Visit: Payer: Self-pay | Admitting: Neurology

## 2010-09-24 DIAGNOSIS — K6289 Other specified diseases of anus and rectum: Secondary | ICD-10-CM

## 2010-09-24 DIAGNOSIS — M79609 Pain in unspecified limb: Secondary | ICD-10-CM

## 2010-09-25 ENCOUNTER — Telehealth: Payer: Self-pay

## 2010-09-25 MED ORDER — GLIMEPIRIDE 4 MG PO TABS: ORAL_TABLET | ORAL | Status: DC

## 2010-09-25 NOTE — Telephone Encounter (Signed)
Amyrl 4mg  - recv'd rx and dicerection say 1 po qd  And  1/2 qd - please clarify

## 2010-09-25 NOTE — Telephone Encounter (Signed)
Printed out rx by mistake - re-ordered with correct directions [facility]filledto [facility]

## 2010-09-25 NOTE — Telephone Encounter (Signed)
1/2 of 4 mg daily

## 2010-09-26 ENCOUNTER — Ambulatory Visit
Admission: RE | Admit: 2010-09-26 | Discharge: 2010-09-26 | Disposition: A | Discharge status: Home / Self Care | Payer: Medicare Other | Source: Ambulatory Visit | Attending: Neurology | Admitting: Neurology

## 2010-09-26 DIAGNOSIS — K6289 Other specified diseases of anus and rectum: Secondary | ICD-10-CM

## 2010-09-26 DIAGNOSIS — M79609 Pain in unspecified limb: Secondary | ICD-10-CM

## 2010-09-26 MED ORDER — IOHEXOL 300 MG/ML  SOLN
100.0000 mL | Freq: Once | INTRAMUSCULAR | Status: AC | PRN
  Administered 2010-09-26: 100 mL via INTRAVENOUS

## 2010-10-11 ENCOUNTER — Other Ambulatory Visit: Payer: Self-pay | Admitting: Gastroenterology

## 2010-10-15 ENCOUNTER — Other Ambulatory Visit: Payer: Self-pay | Admitting: Gastroenterology

## 2010-10-20 ENCOUNTER — Other Ambulatory Visit: Payer: Self-pay | Admitting: Internal Medicine

## 2010-10-22 NOTE — Telephone Encounter (Signed)
#  90  RF 3 

## 2010-10-22 NOTE — Telephone Encounter (Signed)
Please advise - i dont see on med list.

## 2010-11-07 ENCOUNTER — Other Ambulatory Visit: Payer: Self-pay

## 2010-11-07 MED ORDER — FENTANYL 25 MCG/HR TD PT72: 1.0000 | MEDICATED_PATCH | TRANSDERMAL | Status: DC

## 2010-11-07 MED ORDER — NITROGLYCERIN 0.4 MG SL SUBL: 0.4000 mg | SUBLINGUAL_TABLET | SUBLINGUAL | Status: DC | PRN

## 2010-11-07 MED ORDER — TAPENTADOL HCL 100 MG PO TABS: 1.0000 | ORAL_TABLET | Freq: Three times a day (TID) | ORAL | Status: DC

## 2010-11-07 NOTE — Telephone Encounter (Signed)
Faxed back to [facility].

## 2010-11-13 NOTE — Cardiovascular Report (Signed)
NAMEVERSIE, FLEENER NO.:  [id number]   MEDICAL RECORD NO.:  [id number]           PATIENT TYPE:   LOCATION:                                 FACILITY:   PHYSICIAN:  Colleen Can. Deborah Chalk, M.D.DATE OF BIRTH:  May 14, 1930   DATE OF PROCEDURE:  DATE OF DISCHARGE:                            CARDIAC CATHETERIZATION   PROCEDURE:  Left heart catheterization with selective coronary  angiography, left ventricular angiography, saphenous vein graft, and  angiography of the left internal mammary artery.   TYPE AND SITE OF ENTRY:  Percutaneous right femoral artery.   CATHETERS:  A 4-French JL-4 left coronary catheter, 4-French pigtail  ventriculographic catheter, 3-DRC right coronary catheter, right  coronary artery bypass graft catheter, and internal mammary graft  catheter.   CONTRAST MATERIAL:  Omnipaque.   MEDICATIONS GIVEN PRIOR TO PROCEDURE:  Valium 10 mg p.o.   MEDICATIONS:  Given during the procedure, fentanyl 25 mcg IV, labetalol  30 mg IV, nitroglycerin 150 sublingual, and Versed 2 mg IV.   COMMENT:  The patient in general tolerated the procedure well.  He has  some groin discomfort and had substernal chest discomfort that he noted  that was persistent throughout the procedure.  Blood pressure was  elevated and returned to normal before the procedure was terminated.   HEMODYNAMIC DATA:  The aortic pressure was 188/86, LV was 196/19-31.  There is no aortic valve gradient noted on pullback.  1. Right coronary artery.  Right coronary artery is totally occluded      at the acute margin.  There is 90% stenosis in the right coronary      artery approximately 1-1/2 to 2 cm from the origin.  After another      1-1/2 cm, there is a 95% focal stenosis.  There was then a very      small acute marginal branch that was patent.  The right coronary      artery is totally occluded distal to that.  2. Left main coronary artery is normal.  3. Left circumflex.  Left circumflex  had a patent stent very      proximally.  The obtuse marginal had a 30% narrowing.  There was      one small left atrial branch that had 95% focal stenosis.  This was      felt to be too small to lead the angina.  4. Left anterior descending is totally occluded near the ostium.   Saphenous vein graft to distal right coronary artery is widely patent  with a nice insertion, good distal runoff.  The vein graft was smooth  and there is no obstructive flow.   Left internal mammary artery graft to the diagonal and LAD sequentially  was patent with satisfactory filling.  The ostium of the left internal  mammary artery could not be cannulated, but we had satisfactory flow to  numerous flush shots.  The left internal mammary artery to the diagonal  and left anterior descending was widely patent.   Left ventricular angiogram was performed in the RAO position.  Overall,  cardiac size  and silhouette are normal.  The global ejection fraction is  60%, regional wall motion is normal.   OVERALL IMPRESSION:  1. Normal left ventricular function.  2. Totally occluded right coronary artery distally but potentially      severe stenosis in sequential site in the proximal right coronary      artery leading to a very small acute marginal branch; patent stent,      the left circumflex proximally with very small left atrial branch      with potential ischemia, totally occluded left anterior descending      with satisfactory distal grafting by a left internal mammary artery      graft, the diagonal, and LAD.   DISCUSSION:  The normal ejection fraction is certainly encouraging.  There does not even appear to be any abnormal regional wall motion.  The  potential areas for ischemia could be in the right coronary artery to a  very small acute marginal vessel into a very small left atrial branch of  the left circumflex.  Quite frankly these vessels are too small to be  satisfactorily treated with angioplasty.  In  the right coronary artery,  we would dilate the proximal right coronary artery to reestablish flow.  However, I think we are wise to look for other sources of chest pain at  this point in time.  I feel the coronary revascularization is  satisfactory at this point in time.      Colleen Can. Deborah Chalk, M.D.  Electronically Signed     SNT/MEDQ  D:  11/03/2007  T:  11/04/2007  Job:  [id number]

## 2010-11-13 NOTE — Discharge Summary (Signed)
NAMELINTON, STOLP NO.:  [id number]   MEDICAL RECORD NO.:  [id number]          PATIENT TYPE:  INP   LOCATION:  2006                         FACILITY:  [facility]   PHYSICIAN:  Sharlee Blew, N.P.  DATE OF BIRTH:  Mar 24, 1930   DATE OF ADMISSION:  11/03/2007  DATE OF DISCHARGE:  11/04/2007                               DISCHARGE SUMMARY   DISCHARGE DIAGNOSES:  1. Shortness of breath, fatigue, and recurrent chest pain with      subsequent elective cardiac catheterization showing patency of the      previously placed stent from February 2009.  2. Known ischemic heart disease with recent subendocardial anterior      myocardial infarction on August 14, 2007, this was treated      emergently with a stent placement to the proximal left circumflex.      This is a non drug-eluting stent.  3. Remote coronary artery bypass grafting in 1998.  4. Diabetes.  5. Hyperlipidemia.  6. Hypertension.  7. Mild memory loss/dementia.  8. History of prostate cancer, currently on Lupron.  9. Remote history of ventricular fibrillation arrest while on the      treadmill in 1998.  10.Recent left hand trigger finger release February 2009.  11.Osteoarthritis.  12.New onset of anemia.  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   HISTORY OF PRESENT ILLNESS:  [name] [name] is a very pleasant 77 year old  male who has had a recent anterior subendocardial MI in February of this  year.  He was treated emergently with a non drug-eluting stent to the  proximal left circumflex.  He initially did well, but now presents for  recurrent cardiac catheterization due to progressive complaints of  shortness of breath, fatigue as well as recurrent chest pain.  He has  had trouble as well progressing with cardiac rehabilitation.   Please see the history and physical for further patient presentation and  profile.   LABORATORY DATA:  Hemoglobin initially 12.1 with hematocrit of 35.3.  Chemistries showed glucose of 153, BUN 24, creatinine 1.3, and potassium  was 4.4.  PT and PTT were unremarkable.   HOSPITAL COURSE:  The patient was admitted electively as an outpatient  in order to go to diagnostic coronary angiography, that procedure was  tolerated fairly well.  The patient did have a spike in blood pressure  as well as episode of chest pain during that procedure.  His stent was  noted to be patent.  Please see the dictated report for full details and  overall other causes were felt the need to be looked at his somatic  complaints.  He was admitted overnight for further observation.  He had  no recurrent chest pain throughout the rest of his admission.  He did  have a small bump in his troponin at 0.22 and his chemistries remained  stable with a BUN of 13 and creatinine was 1.2.  His hemoglobin did drop  to 10.5 with hematocrit of 30, white count of 3.7, and platelet count  was 140.  He is now felt to be  a satisfactory candidate patient for  discharge with further outpatient evaluation.   Other procedures were performed.  CT scan of the chest, which was  negative for pulmonary embolus or an acute cardiopulmonary disease.  There was marked enlargement of the left lobe with the thyroid and  ultrasound will need to be performed as an outpatient.   DISCHARGE CONDITION:  Stable.   DISCHARGE DIET:  Diabetic.   ACTIVITIES:  Activities to be increased as tolerated.   DISCHARGE MEDICINES:  Will be all as he was taking before except we will  be adding Pepcid 40 mg at night.  His other medicines include:  1. Boniva one time a month.  2. Vitamin C, E, and D daily.  3. Megestrol 20 mg a day.  4. Plavix 75 mg a day.  5. Actos 15 mg a day  6. Exforge 320/5 mg daily.  7. Lipitor 10 mg a day.  8. Amaryl 4 mg b.i.d.  9. Omega-3 daily.  10.Glucosamine 2 times a day.  11.Aricept 10 mg a day.  12.Calcium plus D daily.  13.Multivitamin daily.  14.Aspirin 325 a day.   Plan:   I will see him back in the office in 1 week, until that time he  will need to have repeat CBC referral for GI as well as thyroid  ultrasound arranged.  He is to call our office if any problems arise in  the interim.      Sharlee Blew, N.P.     LC/MEDQ  D:  11/04/2007  T:  11/05/2007  Job:  [id number]

## 2010-11-13 NOTE — Op Note (Signed)
[name] [name], [name] [name]                 ACCOUNT NO.:  [id number]   MEDICAL RECORD NO.:  [id number]          PATIENT TYPE:  AMB   LOCATION:  DSC                          FACILITY:  [facility]   PHYSICIAN:  Katy Fitch. Sypher, M.D. DATE OF BIRTH:  08/10/1929   DATE OF PROCEDURE:  08/14/2007  DATE OF DISCHARGE:                               OPERATIVE REPORT   PREOPERATIVE DIAGNOSIS:  Chronic stenosing tenosynovitis, left long and  ring fingers, associated type 2 diabetes.   POSTOPERATIVE DIAGNOSIS:  Chronic stenosing tenosynovitis, left long and  ring fingers, associated type 2 diabetes.   OPERATION:  1. Release of left long finger A1 pulley.  2. Through a separate incision, release of left ring finger A1 pulley.   SURGEON:  Katy Fitch. Sypher, M.D.   ASSISTANT:  Marveen Reeks. Dasnoit, P.A.-C.   ANESTHESIA:  General by LMA.   SUPERVISING ANESTHESIOLOGIST:  Quita Skye. Krista Blue, M.D.   INDICATIONS:  [name] [name] is a 77 year old gentleman referred through  the courtesy of Dr. Delfin Edis and Dr. Eleonore Chiquito for  evaluation and management of chronic stenosing tenosynovitis.  Mr.  [name] his chronic type 2 diabetes and a history of chronic stenosing  tenosynovitis unresponsive to steroid injection and anti-inflammatory  medication.  Due to a failure to respond to nonoperative measures, he is  brought to the operating room at this time anticipating release of his  A1 pulleys of the affected fingers.  During our preoperative interview  in the holding area, he reported that he had chronic triggering of his  left long and ring fingers and, therefore, seeks release of the A1  pulleys of the left long and left ring fingers.  After informed consent,  he is brought to the operating room at this time.   Preoperatively he was interviewed by Dr. Krista Blue for anesthesia  consultation.  Choices between local anesthesia and sedation and general  anesthesia were contemplated and ultimately Dr. Krista Blue  recommended  proceeding with general anesthesia by LMA.  [name] [name] is brought to the  operating room at this time.   DESCRIPTION OF PROCEDURE:  [name] [name] is brought to the operating room  and placed in the supine position on the operating table.  Following the  induction of general anesthesia by LMA technique, the left arm was  prepped with Betadine soap solution and sterilely draped.  A pneumatic  tourniquet was applied to the proximal left brachium.  Following  exsanguination of the left arm with an Esmarch bandage, the arterial  tourniquet was inflated to 220 mmHg.  The procedure commenced with a  short incision directly over the palpably thickened A1 pulleys.  The  subcutaneous tissues were carefully divided taking care to release the  palmar fascia.  Blunt retractors were used to retract the neurovascular  structures followed by release of the A1 pulley of the long finger on  its radial aspect.  Scissors were used to complete the release and the  tendons were delivered and found be otherwise normal.  For range of  motion of the long finger was recovered.   A similar  procedure was performed on the ring finger with a short  transverse incision, isolation of the A1 pulley, splitting of the pulley  along its radial border, and elevation of the tendons to assure that  they had no other pathologies.  The wounds repaired with intradermal 3-0  Prolene suture.  A compressive dressing was applied with Xeroflow,  sterile gauze, and Ace wrap.   [name] [name] will be encouraged to keep his hand dry for the next week and  to begin immediate range of motion exercises.  We will see him back for  follow up in approximately 7-8 days for suture removal and advancement  to an exercise program. For aftercare, he is provided Darvocet-N100, 1  p.o. q.4-6h. p.r.n. pain, 20 tablets without refill.      Katy Fitch Sypher, M.D.  Electronically Signed     RVS/MEDQ  D:  08/14/2007  T:  08/15/2007   Job:  [id number]

## 2010-11-13 NOTE — Discharge Summary (Signed)
NAMENORWIN, ALEMAN NO.:  [id number]   MEDICAL RECORD NO.:  [id number]          PATIENT TYPE:  INP   LOCATION:  2029                         FACILITY:  [facility]   PHYSICIAN:  Colleen Can. Deborah Chalk, M.D.DATE OF BIRTH:  [birth year]   DATE OF ADMISSION:  08/14/2007  DATE OF DISCHARGE:  08/16/2007                               DISCHARGE SUMMARY   DISCHARGE DIAGNOSES:  1. Subendocardial anterior myocardial infarction with cardiac      catheterization and subsequent stent placement to the proximal left      circumflex.  2. Remote history of coronary artery bypass grafting in 1998.  3. Diabetes.  4. Hyperlipidemia.  5. Hypertension.  6. Mild memory loss/dementia.  7. History of prostate cancer, currently on Lupron.  8. History of a ventricular fibrillation arrest while on treadmill in      1998.  9. Recent left hand trigger finger release on August 14, 2007, per      Dr. Josephine Igo.   HISTORY OF PRESENT ILLNESS:  Mr. Szeto is a very pleasant 77 year old  white male.  On the day of admission he had actually come to the  surgical center to have a left hand trigger finger release.  Postoperatively he had 1 hour of substernal chest pain that was relieved  with nitroglycerin x3.  He was subsequently brought to the hospital here  at [facility] and admitted for cardiac catheterization.   Please see the history and physical per Dr. Roger Shelter for further  patient presentation and profile.   LABORATORY DATA:  His INR was 1.1.  Chemistry showed a BUN of 18,  creatinine 1.3.  Hemoglobin was 10.5, hematocrit 31.  Cardiac markers  were negative.  His EKG showed right bundle branch block in the setting  of sinus bradycardia.   HOSPITAL COURSE:  The patient was admitted electively to rule out  myocardial infarction as well as to undergo cardiac catheterization.  That procedure was performed later on that day.  The procedure was  tolerated well without any known  complications.  Ejection fraction was  60%.  Right coronary artery was 100% occluded.  The vein graft to the  right coronary artery and posterior descending is unremarkable.  The LAD  is 100% occluded and the left internal mammary to the diagonal/LAD is  patent.  The left circumflex demonstrated a 90% proximal narrowing.  A  subsequent stent was placed to the proximal left circumflex with a 3.0 x  15-mm Vision stent placed.  A nondrug-eluting stent was chosen because  of the low hematocrit and hemoglobin.  Post procedure he was watched in  the hospital.  He did have small rise in cardiac enzymes with troponin  of 1.0 and CK-MB of 8.3.  He was ambulated and subsequently felt to be  stable for discharge on August 16, 2007, after being seen on morning  rounds.  His peak troponin was 1.96.  He is peak CK-MB was 9.2.   DISCHARGE CONDITION:  Stable.   DISCHARGE DIET:  Diabetic, carbohydrate-modified.   ACTIVITY:  To be increased slowly.  He  is not to lift anything heavy for  a week or drive.   We will plan on seeing him back in the office in approximately 2 weeks,  and he is to see Dr. Teressa Senter on February 18.   His medicines will be as he was taking prior to admission per the home  medication reconciliation sheet, and these include:  1. Vesicare 5 mg a day.  2. Melatonin p.r.n.  3. Actonel 150 mg a day.  4. Amaryl 4 mg daily.  5. Lipitor 5 mg a day.  6. Aricept 10 mg.  7. Exforge 5/320 mg daily.  8. Metformin 1000 mg b.i.d.  9. Baby aspirin daily.  10.Actos 15 mg a day.  11.Vitamin for eyes.  12.Glucosamine and chondroitin daily.  13.B complex and fish oil and coenzyme Q-10 daily.   He is to call our office if any problems arise in the interim.  Otherwise, we will see him back in 2 weeks.      Sharlee Blew, N.P.      Colleen Can. Deborah Chalk, M.D.  Electronically Signed    LC/MEDQ  D:  09/02/2007  T:  09/02/2007  Job:  [id number]

## 2010-11-13 NOTE — H&P (Signed)
[name] [name], [name] [name]                  ACCOUNT NO.:  [id number]   MEDICAL RECORD NO.:  [id number]           PATIENT TYPE:   LOCATION:                                 FACILITY:   PHYSICIAN:  Colleen Can. Deborah Chalk, M.D.    DATE OF BIRTH:   DATE OF ADMISSION:  08/14/2007  DATE OF DISCHARGE:                              HISTORY & PHYSICAL   REASON FOR ADMISSION:  Chest pain.   HISTORY:  [name] [name] had trigger finger release surgery earlier today.  While he was in recovery, he had the onset of substernal chest  discomfort.  He subsequently was given nitroglycerin and aspirin and  transferred to the emergency room for evaluation.  There, he had  bradycardia with right bundle branch block which he has had the past.  He had negative cardiac enzymes otherwise.  However, this is the first  severe episode of chest pain he has had since his cardiac  catheterization and coronary artery bypass grafting in 1998.  He is now  referred for repeat cardiac catheterization.   PAST MEDICAL HISTORY:  1. History of diabetes.  2. Hyperlipidemia.  3. Hypertension.  4. Mild memory loss.  5. Prostate cancer treated with Lupron.   CURRENT MEDICATIONS:  1. Aspirin 81 mg daily.  2. Multivitamin.  3. Calcium D.  4. Aricept 10 mg a day.  5. Glucophage 1000 mg b.i.d.  6. Glucosamine chondroitin b.i.d.  7. Omega 3 fatty acids daily.  8. Amaryl 4 mg per day.  9. Lipitor 5 mg per day.  10.Fish oil one b.i.d.  11.Actonel once a week.  12.Exforge 320/5 daily.  13.Actos 15 mg a day.   ALLERGIES:  None known.   PREVIOUS SURGERIES:  Coronary artery bypass grafting in 1998.   SOCIAL HISTORY:  He is an Pensions consultant.  He has four grown children.  No  cigarettes.  No alcohol.   ALLERGIES:  Currently shows no known drug allergies, but there is a  questionable history of CODEINE allergy in the remote past.   FAMILY HISTORY:  Noncontributory.   REVIEW OF SYSTEMS:  Remarkable for mild dementia.  He has had a history  of vertigo, as well as previous correction of trigger fingers.  He has  had intermittent bronchitis.  He has been on Lupron treatments for his  prostate cancer.  He has had a negative stress Cardiolite study last  done in March of 2080 which shows no evidence of ischemia and showed an  ejection fraction of 61%.   PHYSICAL EXAMINATION:  GENERAL:  He is pleasant.  In no acute distress.  VITAL SIGNS:  Heart rate is 47, blood pressure was 110/70.  HEENT:  Negative.  NECK:  Supple.  LUNGS:  Clear.  HEART:  Bradycardia but regular rate and rhythm.  ABDOMEN:  Soft, nontender.  EXTREMITIES:  Without edema.   ELECTROCARDIOGRAM:  Sinus bradycardia with right bundle branch block.   LABORATORY DATA:  Normal renal function.  He had a hematocrit of  approximately 31 (lab not in front of me not at this point in time).  OVERALL IMPRESSION:  1. Chest pain of questionable etiology, but with known history of      coronary disease.  2. Recent hand surgeries.  3. Diabetes mellitus.  4. Hypertension.  5. Lipid abnormality.  6. History of prostate cancer.  7. Anemia.   PLAN:  Will proceed on with cardiac catheterization and coronary  arteriograms.  Procedure, risks and benefits have been explained.  Risks  including heart attack, stroke, heart stoppage, death, __________  bleeding have all been explained, and the patient is willing to proceed.      Colleen Can. Deborah Chalk, M.D.  Electronically Signed     SNT/MEDQ  D:  08/14/2007  T:  08/16/2007  Job:  [id number]   cc:   Katy Fitch. Sypher, M.D.

## 2010-11-13 NOTE — Cardiovascular Report (Signed)
NAMETAREEK, [name] [name] NO.:  [id number]   MEDICAL RECORD NO.:  [id number]          PATIENT TYPE:  INP   LOCATION:  2029                         FACILITY:  [facility]   PHYSICIAN:  Colleen Can. Deborah Chalk, M.D.DATE OF BIRTH:  10/28/1929   DATE OF PROCEDURE:  08/14/2007  DATE OF DISCHARGE:  08/16/2007                            CARDIAC CATHETERIZATION   PROCEDURE:  Left heart catheterization with selective coronary  angiography, left ventricular angiography with stent placement in the  left circumflex coronary.   Type and site of entry is percutaneous right femoral artery with  AngioSeal.   CATHETERS:  6-French four curved Judkins right coronary catheter, 6-  French pigtail ventriculographic catheter, 6-French left internal  mammary graft catheter.   CONTRAST MATERIAL:  Omnipaque.   MEDICATIONS:  Given prior procedure:  Valium 10 mg.   MEDICATIONS:  Given during procedure:  1. Versed 2 mg IV.  2. AngioMax.  3. IV nitroglycerin.  4. Plavix 600 mg p.o.   COMMENT:  The patient tolerated the procedure well.   HEMODYNAMIC DATA:  The aortic pressure is 167/78, LV was 167/2-13.  There is no aortic valve gradient noted on pullback.   ANGIOGRAPHIC DATA:  The right coronary artery is totally occluded in the  mid section.  There was an acute marginal vessel and severe stenosis  prior to this, but it was in a relatively small, diffusely diseased  segment.   Saphenous vein graft to this right coronary artery posterior descending  is widely patent with a nice insertion and excellent distal runoff.   Left main coronary artery is normal.   Left anterior descending is totally occluded at its ostium.   Left internal mammary graft to the diagonal and left anterior descending  is widely patent.  This is a jump graft to both vessels.  There is  proximal disease in his diagonal vessel, but the distal runoff is  excellent.   Left circumflex:  Left circumflex continues as a  marginal and  continuation branch of left circumflex.  There is a 90% proximal  stenosis that begins at the left main and continues for approximately 10-  15 millimeters.   Left ventricular angiogram performed in RAO position.  Overall cardiac  size and silhouette are normal, global ejection fraction is estimated to  be at 60%.   Angioplasty procedure.  We changed for a [employer] guide,  which only provided marginal backup.  We used an [employer] soft guidewire.  We we initially predilated with 2.5 x 15 mm Maverick balloon and then  placed a 3.0 x 15-mm Vision stent in the proximal left circumflex.  This  was an excellent fit.  We postdilated with a 3.25 x 12-mm Quantum  balloon to 17 atmospheres which would give a final proximal dilatation  of 3.35 diameter.  A non drug-eluting stent was chosen because of  questionable mild anemia on the admission labs, and a short segment of  the stent and a relatively satisfactory diameter.   OVERALL IMPRESSION:  1. Severe stenosis proximal left circumflex with successful stent  placement.  2. Patent left internal mammary graft to the LAD and diagonal.  3. Patent saphenous vein graft to the right coronary artery.  4. Totally occluded right coronary artery and left circumflex.  5. Normal left ventricular function.      Colleen Can. Deborah Chalk, M.D.  Electronically Signed     SNT/MEDQ  D:  08/14/2007  T:  08/17/2007  Job:  [id number]

## 2010-11-13 NOTE — H&P (Signed)
NAMEMARCELLAS, [name] NO.:  [id number]   MEDICAL RECORD NO.:  [id number]           PATIENT TYPE:   LOCATION:                                 FACILITY:   PHYSICIAN:  Colleen Can. Deborah Chalk, M.D.DATE OF BIRTH:  03-Apr-1930   DATE OF ADMISSION:  DATE OF DISCHARGE:                              HISTORY & PHYSICAL   CHIEF COMPLAINT:  Shortness of breath, fatigue, and recurrent chest  pain.   HISTORY OF PRESENT ILLNESS:  [name] [name] is a very pleasant 77 year old  male who has had a recent subendocardial anterior MI on August 14, 2007.  This was treated with emergent stent placement to the proximal  left circumflex.  He initially did well but now presents for repeat  cardiac catheterization due to progressive complaints of shortness of  breath, significant fatigue, as well as recurrent chest pain.  He has  had trouble progressing with cardiac rehabilitation.   PAST MEDICAL HISTORY:  1. Subendocardial anterior MI with cardiac catheterization and      subsequent stenting of the proximal left circumflex.  2. History of remote coronary artery bypass grafting in 1998 with LIMA      to the LAD and diagonal, saphenous vein graft to the right coronary      artery and PDA, per Dr. Particia Lather in June 1998.  3. Diabetes.  4. Hyperlipidemia.  5. Hypertension.  6. Mild memory loss/dementia.  7. History of prostate cancer, currently on Lupron.  8. Remote history of ventricular fibrillation arrest while on the      treadmill in 1998.  9. Recent left hand trigger finger release on August 14, 2007 per      Dr. Josephine Igo.  10.Osteoarthritis.   ALLERGIES:  CODEINE.   CURRENT MEDICINES:  1. Aspirin 325 a day.  2. Multivitamin daily.  3. Calcium plus D daily.  4. Aricept 10 mg a day.  5. Glucosamine twice a day.  6. Omega-3 daily.  7. Amaryl 4 mg b.i.d.  8. Lipitor 10 mg a day.  9. Exforge 320 - five daily.  10.Actos 15 mg a day.  11.Plavix 75 mg a day.  12.Megestrol 20 mg a day.  13.Vitamin C daily.  14.Vitamin E daily.  15.Vitamin D daily.  16.Boniva once a month.   FAMILY HISTORY:  Unchanged.   SOCIAL HISTORY:  He is married.  He has no current alcohol or tobacco.  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   REVIEW OF SYSTEMS:  As noted above and otherwise unremarkable.   PHYSICAL EXAMINATION:  GENERAL APPEARANCE:  He is pleasant and he is in  no acute distress.  VITAL SIGNS:  His weight is 194 pounds, blood pressure is 112/70 sitting  and 120/70 standing, heart rate is 56 and regular, respirations 18, he  is afebrile.  SKIN:  Warm and dry.  Color is unremarkable.  LUNGS:  Basically clear.  HEART:  Regular rhythm.  ABDOMEN:  Soft.  EXTREMITIES:  Without edema.  NEUROLOGIC:  No gross focal deficits.   LABORATORY DATA:  Recent lab shows chemistries to be  normal with a  potassium of 4.5, creatinine of 1.2.  CBC shows a hemoglobin of 11.3,  hematocrit of 34.  He has also had a recent 2-D echocardiogram which  shows normal ejection fraction of 55%-60% with mild LVH.  Other labs are  pending.   OVERALL IMPRESSION:  1. Recurrent fatigue, progressive shortness of breath, as well as      chest pain.  2. Recent subendocardial anterior myocardial infarction treated with      stenting of the proximal left circumflex.  3. Remote coronary artery bypass grafting in 1998.  4. Hyperlipidemia.  5. Hypertension.  6. Prostate cancer.  7. Mild memory loss/dementia.   PLAN:  We will proceed on with diagnostic cardiac catheterization and  the procedure has been reviewed in full detail including the risks and  benefits, and he is willing to proceed on Tuesday, Nov 03, 2007.      Sharlee Blew, N.P.      Colleen Can. Deborah Chalk, M.D.  Electronically Signed    LC/MEDQ  D:  10/30/2007  T:  10/31/2007  Job:  [id number]

## 2010-11-15 ENCOUNTER — Encounter: Payer: Self-pay | Admitting: Nurse Practitioner

## 2010-11-15 DIAGNOSIS — R5383 Other fatigue: Secondary | ICD-10-CM | POA: Insufficient documentation

## 2010-11-15 DIAGNOSIS — R413 Other amnesia: Secondary | ICD-10-CM | POA: Insufficient documentation

## 2010-11-15 DIAGNOSIS — K5909 Other constipation: Secondary | ICD-10-CM | POA: Insufficient documentation

## 2010-11-15 DIAGNOSIS — K589 Irritable bowel syndrome without diarrhea: Secondary | ICD-10-CM | POA: Insufficient documentation

## 2010-11-15 DIAGNOSIS — K626 Ulcer of anus and rectum: Secondary | ICD-10-CM | POA: Insufficient documentation

## 2010-11-15 DIAGNOSIS — M1990 Unspecified osteoarthritis, unspecified site: Secondary | ICD-10-CM | POA: Insufficient documentation

## 2010-11-15 DIAGNOSIS — Z8679 Personal history of other diseases of the circulatory system: Secondary | ICD-10-CM | POA: Insufficient documentation

## 2010-11-15 DIAGNOSIS — I259 Chronic ischemic heart disease, unspecified: Secondary | ICD-10-CM | POA: Insufficient documentation

## 2010-11-15 DIAGNOSIS — C61 Malignant neoplasm of prostate: Secondary | ICD-10-CM | POA: Insufficient documentation

## 2010-11-15 DIAGNOSIS — K529 Noninfective gastroenteritis and colitis, unspecified: Secondary | ICD-10-CM | POA: Insufficient documentation

## 2010-11-15 DIAGNOSIS — R233 Spontaneous ecchymoses: Secondary | ICD-10-CM | POA: Insufficient documentation

## 2010-11-15 DIAGNOSIS — N529 Male erectile dysfunction, unspecified: Secondary | ICD-10-CM | POA: Insufficient documentation

## 2010-11-15 DIAGNOSIS — R238 Other skin changes: Secondary | ICD-10-CM | POA: Insufficient documentation

## 2010-11-16 NOTE — Assessment & Plan Note (Signed)
[facility]                            [facility] OFFICE NOTE   NAME:[name], [name] RICHES                        MRN:          [id number]  DATE:10/08/2006                            DOB:          Nov 25, 1929    HISTORY OF PRESENT ILLNESS:  A 77 year old gentleman with a history of  hypertension, diabetes, coronary artery disease.  He awoke at 3 a.m.  while in [place], [place], with severe vertigo, some nausea.  This has improved throughout the day.  At the present time, he just  feels a bit unwell and unsteady.  No prior episodes of vertigo.  He has  had a recent full cardiac evaluation including 2D echo and stress  Cardiolite.  He is felt to have stable ischemic heart disease.  His wife  accompanies him and also states that for a few months he has been weaker  with decreased exercise capacity.   PHYSICAL EXAMINATION:  GENERAL:  Exam today revealed him to be awake and  alert.  VITAL SIGNS:  Blood pressure was normal and symmetrical.  HEENT:  Fundi not well visualized secondary to myotic pupils.  Pupil  responses were reactive.  Extraocular muscles were full.  He had some  mild right ptosis that has been chronic and stable.  There is no facial  asymmetry.  Tongue and uvula were midline.  There is no drift to the  outstretched arms.  Finger-to-nose testing performed reasonably well.  He did have some difficulty with rapid alternating movements.  He is  able to stand with his feet together without falling and even did a  tandem walk reasonably well.   IMPRESSION:  Episode of severe vertigo, probable peripheral etiology.   DISPOSITION:  Will check some laboratory studies in view of his chronic  fatigue.  Will include sedimentation rate, hemoglobin A1C. Will set up  for a brain MRI.     Gordy Savers, MD  Electronically Signed    PFK/MedQ  DD: 10/08/2006  DT: 10/08/2006  Job #: [id number]

## 2010-11-16 NOTE — Consult Note (Signed)
NAME:  [name] [name], CULP NO.:  [id number]   MEDICAL RECORD NO.:  [id number]                   PATIENT TYPE:  EMS   LOCATION:  MAJO                                 FACILITY:  [facility]   PHYSICIAN:  Peter M. Swaziland, M.D.               DATE OF BIRTH:  1930/05/10   DATE OF CONSULTATION:  11/04/2002  DATE OF DISCHARGE:                                   CONSULTATION   HISTORY OF PRESENT ILLNESS:  The patient is a 77 year old white male with  known history of coronary artery disease status post CABG in 1998 who  presents for evaluation of chest pain.  He states yesterday he was carrying  a heavy box to his office when he felt a vague tingling in his left chest.  Today he has had similar tingling and slight discomfort in his left pectoral  region off and on.  This was not exertional.  It lasts a few seconds and  then resolves.  He did note a little bit of shortness of breath with walking  today.  He has had similar vague chest discomfort documented in his chart  over the past several years.  He had a negative Cardiolite study in February  of 2003.   PAST MEDICAL HISTORY:  1. ASCAD initially diagnosed with development of ventricular tachycardia on     stress testing.  He is status post CABG in 1998 with a sequential LIMA to     the LAD and diagonal branches, sequential saphenous vein graft to the     first and second marginal branches, and a saphenous vein graft to the     right coronary artery.  2. Has history of diabetes mellitus type 2.  3. History of removal of a skin cancer.  4. History of memory loss.   CURRENT MEDICATIONS:  1. Aspirin.  2. Multivitamin.  3. Vitamin E 800 international units daily.  4. Vitamin C 1000 mg daily.  5. Calcium 1000 mg daily.  6. Aricept 10 mg daily.  7. Glucophage 1000 mg daily.  8. Lipitor 5 mg daily.   ALLERGIES:  CODEINE.   SOCIAL HISTORY:  The patient is an Pensions consultant.  He is married.  He has four  children.  Denies  tobacco or alcohol use.   FAMILY HISTORY:  Noncontributory.   REVIEW OF SYSTEMS:  Otherwise negative.   PHYSICAL EXAMINATION:  GENERAL:  The patient is a pleasant white male in no  distress.  VITAL SIGNS:  Blood pressure 146/81, pulse 52, respirations 18.  He is  afebrile.  Saturations are 98% on room air.  LUNGS:  Clear.  CARDIAC:  Regular rate and rhythm without gallops, murmurs, or clicks.  ABDOMEN:  Soft, nontender.  No hepatosplenomegaly, masses, or bruits.  EXTREMITIES:  Femoral and pedal pulses are 2+ and symmetric.  He has no  edema.  NEUROLOGIC:  Intact.  LABORATORY DATA:  ECG shows normal sinus rhythm with an old right bundle  branch block.  Chest x-ray shows a small granuloma in the right middle lobe.  Sodium 139, potassium 4.0, chloride 106, CO2 25, BUN 17, creatinine 1.1,  glucose 117.  White count 4800, hemoglobin 13.8, hematocrit 38.7, platelets  180,000.  CK 66 with 2.0 MB, troponin 0.01.   IMPRESSION:  1. Atypical chest pain.  2. Atherosclerotic coronary artery disease status post coronary artery     bypass graft.  3. Diabetes mellitus type 2.   PLAN:  Have recommended discharging patient to home.  Will arrange a follow-  up stress Cardiolite study as an outpatient with Colleen Can. Deborah Chalk, M.D.                                               Peter M. Sw[place]iland, M.D.    PMJ/MEDQ  D:  11/04/2002  T:  11/05/2002  Job:  [id number]   cc:   Gordy Savers, M.D. [facility]. Deborah Chalk, M.D.  [address]  [place]  [place] [address]  Fax: [phone]

## 2010-11-19 ENCOUNTER — Encounter: Payer: Self-pay | Admitting: Nurse Practitioner

## 2010-11-19 ENCOUNTER — Ambulatory Visit (INDEPENDENT_AMBULATORY_CARE_PROVIDER_SITE_OTHER): Payer: Medicare Other | Admitting: Nurse Practitioner

## 2010-11-19 VITALS — BP 112/60 | HR 56 | Ht 67.0 in | Wt 172.2 lb

## 2010-11-19 DIAGNOSIS — R5381 Other malaise: Secondary | ICD-10-CM

## 2010-11-19 DIAGNOSIS — E785 Hyperlipidemia, unspecified: Secondary | ICD-10-CM

## 2010-11-19 DIAGNOSIS — I2510 Atherosclerotic heart disease of native coronary artery without angina pectoris: Secondary | ICD-10-CM

## 2010-11-19 DIAGNOSIS — R5383 Other fatigue: Secondary | ICD-10-CM

## 2010-11-19 NOTE — Progress Notes (Signed)
[name] [name] Date of Birth: Dec 19, 1929   History of Present Illness: [name] [name] is seen today for a follow up visit. He is seen for Dr. Deborah Chalk. It his 6 month check. From our standpoint, he is doing well. He is not having chest pain. He continues to have this undefined rectal pain. He has seen GI and neuro to no avail. He has tried some biofeedback with no real results. It is primarily at night. He is now on narcotics. He is very fatigued. He needs to have lipids done. He would like to have his testosterone checked.   Current Outpatient Prescriptions on File Prior to Visit  Medication Sig Dispense Refill  . acetaminophen (TYLENOL) 325 MG tablet Take 650 mg by mouth every 6 (six) hours as needed.        [name] amLODipine-valsartan (EXFORGE) 10-320 MG per tablet Take 1 tablet by mouth daily.        . Ascorbic Acid (VITAMIN C) 1000 MG tablet Take 1,000 mg by mouth daily.        [name] aspirin 81 MG tablet Take 81 mg by mouth daily.        [name] atorvastatin (LIPITOR) 10 MG tablet Take 1 tablet (10 mg total) by mouth daily.  90 tablet  6  . Cholecalciferol (VITAMIN D) 1000 UNITS capsule Take 1,000 Units by mouth daily.        [name] docusate sodium (COLACE) 250 MG capsule Take 250 mg by mouth daily.        [name] donepezil (ARICEPT) 10 MG tablet Take 10 mg by mouth daily. And prn       . fentaNYL (DURAGESIC - DOSED MCG/HR) 25 MCG/HR Place 1 patch (25 mcg total) onto the skin every 3 (three) days.  5 patch  0  . fish oil-omega-3 fatty acids 1000 MG capsule Take by mouth daily.        [name] glimepiride (AMARYL) 4 MG tablet 1/2 tab qAM  90 tablet  6  . ibandronate (BONIVA) 150 MG tablet Take 150 mg by mouth every 30 (thirty) days. Take in the morning with a full glass of water, on an empty stomach, and do not take anything else by mouth or lie down for the next 30 min.       [name] levothyroxine (SYNTHROID, LEVOTHROID) 50 MCG tablet Take 50 mcg by mouth daily.        [name] lidocaine (XYLOCAINE) 2 % jelly Apply topically as needed. As  directed       . metFORMIN (GLUCOPHAGE) 1000 MG tablet Take 1,000 mg by mouth daily.        . Multiple Vitamin (MULTIVITAMIN) tablet Take 1 tablet by mouth daily.        . naproxen sodium (ANAPROX) 220 MG tablet Take 220 mg by mouth 2 (two) times daily with meals.        . nitroGLYCERIN (NITROSTAT) 0.4 MG SL tablet Place 1 tablet (0.4 mg total) under the tongue every 5 (five) minutes as needed.  25 tablet  4  . pregabalin (LYRICA) 50 MG capsule Take 50 mg by mouth 2 (two) times daily.        . Tapentadol HCl 100 MG TABS Take 1 tablet (100 mg total) by mouth 3 (three) times daily.  180 tablet  0  . DISCONTD: chlorthalidone (HYGROTON) 25 MG tablet TAKE 1/2 TABLET BY MOUTH DAILY  90 tablet  0    Allergies  Allergen Reactions  . Codeine Phosphate  REACTION: unspecified    Past Medical History  Diagnosis Date  . Hypertension   . Diabetes mellitus   . Hyperlipidemia   . Fatigue   . Memory loss     mild  . Rectal ulcer   . IHD (ischemic heart disease)   . Constipation   . Bruises easily   . History of ventricular fibrillation 1998    while on treadmill  . Hypothyroidism   . Prostate cancer   . IBS (irritable bowel syndrome)   . Colitis   . ED (erectile dysfunction)   . OA (osteoarthritis)     Past Surgical History  Procedure Date  . Coronary artery bypass graft 1998    x5, with a left internal mammary to the LAD and diagonal, vein graft to the OM1 and 2, and a vein graft to the right coronary artery  . Cardiac catheterization 11/13/2007    CARDIAC SIZE AND SILHOUETTE ARE NORMAL. EF 60%    History  Smoking status  . Never Smoker   Smokeless tobacco  . Not on file    History  Alcohol Use: Not on file    Family History  Problem Relation Age of Onset  . Stroke Father   . Pneumonia Father     Review of Systems: The review of systems is as above.  All other systems were reviewed and are negative.  Physical Exam: BP 112/60  Pulse 56  Ht 5\' 7"  (1.702 m)  Wt  172 lb 3.2 oz (78.109 kg)  BMI 26.97 kg/m2 Patient is very pleasant and in no acute distress. Skin is warm and dry. Color is normal.  HEENT is unremarkable. Normocephalic/atraumatic. PERRL. Sclera are nonicteric. Neck is supple. No masses. No JVD. Lungs are clear. Cardiac exam shows a regular rate and rhythm. Abdomen is soft. Extremities are without edema. Gait and ROM are intact. No gross neurologic deficits noted.  LABORATORY DATA:   Assessment / Plan:

## 2010-11-19 NOTE — Patient Instructions (Signed)
We are going to keep you on your current medicines. We will check labs tomorrow. Come fasting. I will have you see Dr. Antoine Poche in about 6 months.

## 2010-11-19 NOTE — Assessment & Plan Note (Signed)
He is doing well from our standpoint. We will check fasting labs later this week. We discussed Dr. Ronnald Nian retirement. We will have him see Dr. Antoine Poche in about 6 months. Patient is agreeable to this plan and will call if any problems develop in the interim.

## 2010-11-20 ENCOUNTER — Encounter: Payer: Self-pay | Admitting: Internal Medicine

## 2010-11-20 ENCOUNTER — Other Ambulatory Visit (INDEPENDENT_AMBULATORY_CARE_PROVIDER_SITE_OTHER): Payer: Medicare Other | Admitting: *Deleted

## 2010-11-20 DIAGNOSIS — R5383 Other fatigue: Secondary | ICD-10-CM

## 2010-11-20 DIAGNOSIS — R5381 Other malaise: Secondary | ICD-10-CM

## 2010-11-20 DIAGNOSIS — I2510 Atherosclerotic heart disease of native coronary artery without angina pectoris: Secondary | ICD-10-CM

## 2010-11-20 DIAGNOSIS — E785 Hyperlipidemia, unspecified: Secondary | ICD-10-CM

## 2010-11-20 LAB — LIPID PANEL
Cholesterol: 109 mg/dL (ref 0–200)
HDL: 34.2 mg/dL — ABNORMAL LOW (ref >39.00)
LDL Cholesterol: 57 mg/dL (ref 0–99)
Total CHOL/HDL Ratio: 3
Triglycerides: 90 mg/dL (ref 0.0–149.0)
VLDL: 18 mg/dL (ref 0.0–40.0)

## 2010-11-20 LAB — CBC WITH DIFFERENTIAL/PLATELET
Basophils Absolute: 0 10*3/uL (ref 0.0–0.1)
Basophils Relative: 0.6 % (ref 0.0–3.0)
Eosinophils Absolute: 0.1 10*3/uL (ref 0.0–0.7)
Eosinophils Relative: 3 % (ref 0.0–5.0)
HCT: 33.8 % — ABNORMAL LOW (ref 39.0–52.0)
Hemoglobin: 12 g/dL — ABNORMAL LOW (ref 13.0–17.0)
Lymphocytes Relative: 20.6 % (ref 12.0–46.0)
Lymphs Abs: 0.7 10*3/uL (ref 0.7–4.0)
MCHC: 35.4 g/dL (ref 30.0–36.0)
MCV: 97.3 fl (ref 78.0–100.0)
Monocytes Absolute: 0.3 10*3/uL (ref 0.1–1.0)
Monocytes Relative: 9.2 % (ref 3.0–12.0)
Neutro Abs: 2.2 10*3/uL (ref 1.4–7.7)
Neutrophils Relative %: 66.6 % (ref 43.0–77.0)
Platelets: 144 10*3/uL — ABNORMAL LOW (ref 150.0–400.0)
RBC: 3.47 Mil/uL — ABNORMAL LOW (ref 4.22–5.81)
RDW: 14 % (ref 11.5–14.6)
WBC: 3.3 10*3/uL — ABNORMAL LOW (ref 4.5–10.5)

## 2010-11-20 LAB — HEPATIC FUNCTION PANEL
ALT: 19 U/L (ref 0–53)
AST: 23 U/L (ref 0–37)
Albumin: 3.9 g/dL (ref 3.5–5.2)
Alkaline Phosphatase: 30 U/L — ABNORMAL LOW (ref 39–117)
Bilirubin, Direct: 0.1 mg/dL (ref 0.0–0.3)
Total Bilirubin: 0.7 mg/dL (ref 0.3–1.2)
Total Protein: 6.2 g/dL (ref 6.0–8.3)

## 2010-11-20 LAB — BASIC METABOLIC PANEL
BUN: 21 mg/dL (ref 6–23)
CO2: 29 mEq/L (ref 19–32)
Calcium: 9.4 mg/dL (ref 8.4–10.5)
Chloride: 107 mEq/L (ref 96–112)
Creatinine, Ser: 1.5 mg/dL (ref 0.4–1.5)
GFR: 47.69 mL/min — ABNORMAL LOW (ref >60.00)
Glucose, Bld: 169 mg/dL — ABNORMAL HIGH (ref 70–99)
Potassium: 4.2 mEq/L (ref 3.5–5.1)
Sodium: 142 mEq/L (ref 135–145)

## 2010-11-20 LAB — TSH: TSH: 0.39 u[IU]/mL (ref 0.35–5.50)

## 2010-11-20 LAB — TESTOSTERONE: Testosterone: 255.8 ng/dL — ABNORMAL LOW (ref 350.00–890.00)

## 2010-11-22 ENCOUNTER — Telehealth: Payer: Self-pay

## 2010-11-22 MED ORDER — FENTANYL 25 MCG/HR TD PT72: 1.0000 | MEDICATED_PATCH | TRANSDERMAL | Status: DC

## 2010-11-22 NOTE — Telephone Encounter (Signed)
requesting refill on fentanyl patches due to travel and will need while out of town.  Ok per dr. Amador Cunas to disp.#10   Pt aware ready for pick up

## 2010-11-24 ENCOUNTER — Other Ambulatory Visit: Payer: Self-pay | Admitting: Internal Medicine

## 2010-11-28 ENCOUNTER — Telehealth: Payer: Self-pay | Admitting: Internal Medicine

## 2010-11-28 NOTE — Telephone Encounter (Signed)
Attempt to call cell # - VM - [id number] if questions rx ready for pick up. KIK

## 2010-11-28 NOTE — Telephone Encounter (Signed)
Pt went to [facility] to pick up Fentanyl patch and was told by pharmacist, that this was a controlled substance and pt would have to pick up written script from pcp. Pls call pt when script avail.

## 2010-11-29 ENCOUNTER — Telehealth: Payer: Self-pay | Admitting: Internal Medicine

## 2010-11-29 NOTE — Telephone Encounter (Signed)
Left message with deb at pt office rx ready for pick up.

## 2010-12-10 ENCOUNTER — Ambulatory Visit (INDEPENDENT_AMBULATORY_CARE_PROVIDER_SITE_OTHER): Payer: Medicare Other | Admitting: Internal Medicine

## 2010-12-10 ENCOUNTER — Encounter: Payer: Self-pay | Admitting: Internal Medicine

## 2010-12-10 VITALS — BP 110/80 | Wt 181.0 lb

## 2010-12-10 DIAGNOSIS — E119 Type 2 diabetes mellitus without complications: Secondary | ICD-10-CM

## 2010-12-10 LAB — GLUCOSE, POCT (MANUAL RESULT ENTRY): POC Glucose: 84

## 2010-12-10 MED ORDER — FENTANYL 50 MCG/HR TD PT72: 1.0000 | MEDICATED_PATCH | TRANSDERMAL | Status: AC

## 2010-12-10 NOTE — Patient Instructions (Signed)
Return in one month for follow-up   Please check your hemoglobin A1c every 3 months  Followup neurology  Consider referral to pain management Center

## 2010-12-10 NOTE — Progress Notes (Signed)
  Subjective:    Patient ID: [name] [name], male    DOB: 1929-08-24, 77 y.o.   MRN: [id number]  HPI  77 year old patient who is seen today for followup. He continues to have severe left leg and perineal discomfort. He has had a full neurological evaluation and the exact etiology remains unclear. He has been placed on Lyrica 100 mg twice a day which she appears to tolerate well. He is on narcotic analgesics which has caused some constipation issues. Thankfully his pain is worse at night and he takes additional analgesia with some sedation. His diabetes remains reasonably well controlled. Hemoglobin A1c checked today. He feels that he has had some loss of strength in the left leg and now walks with a limp    Review of Systems  Constitutional: Negative for fever, chills, appetite change and fatigue.  HENT: Negative for hearing loss, ear pain, congestion, sore throat, trouble swallowing, neck stiffness, dental problem, voice change and tinnitus.   Eyes: Negative for pain, discharge and visual disturbance.  Respiratory: Negative for cough, chest tightness, wheezing and stridor.   Cardiovascular: Negative for chest pain, palpitations and leg swelling.  Gastrointestinal: Negative for nausea, vomiting, abdominal pain, diarrhea, constipation, blood in stool and abdominal distention.  Genitourinary: Negative for urgency, hematuria, flank pain, discharge, difficulty urinating and genital sores.  Musculoskeletal: Positive for gait problem. Negative for myalgias, back pain, joint swelling and arthralgias.  Skin: Negative for rash.  Neurological: Positive for weakness. Negative for dizziness, syncope, speech difficulty, numbness and headaches.  Hematological: Negative for adenopathy. Does not bruise/bleed easily.  Psychiatric/Behavioral: Negative for behavioral problems and dysphoric mood. The patient is not nervous/anxious.        Objective:   Physical Exam  Constitutional: He appears well-developed and  well-nourished.  HENT:  Head: Normocephalic and atraumatic.  Right Ear: External ear normal.  Left Ear: External ear normal.  Nose: Nose normal.  Mouth/Throat: Oropharynx is clear and moist.  Eyes: Conjunctivae and EOM are normal. Pupils are equal, round, and reactive to light. No scleral icterus.  Neck: Normal range of motion. Neck supple. No JVD present. No thyromegaly present.  Cardiovascular: Regular rhythm, normal heart sounds and intact distal pulses.  Exam reveals no gallop and no friction rub.   No murmur heard. Pulmonary/Chest: Effort normal and breath sounds normal. He exhibits no tenderness.  Abdominal: Soft. Bowel sounds are normal. He exhibits no distension and no mass. There is no tenderness.  Genitourinary: Prostate normal and penis normal.  Musculoskeletal: Normal range of motion. He exhibits no edema and no tenderness.  Lymphadenopathy:    He has no cervical adenopathy.  Neurological: He is alert. He has normal reflexes. No cranial nerve deficit. Coordination normal.       Suggestion of some mild muscle atrophy in the left high hamstring area  Skin: Skin is warm and dry. No rash noted.  Psychiatric: He has a normal mood and affect. His behavior is normal.          Assessment & Plan:   Diabetes mellitus Chronic pain syndrome Hypertension stable  We'll increase his Duragesic patch to 50. Continue when necessary analgesics. A new prescription dispensed

## 2010-12-11 NOTE — Progress Notes (Signed)
Quick Note:  Mailed to [employer] ______

## 2010-12-21 ENCOUNTER — Encounter: Payer: Self-pay | Admitting: Internal Medicine

## 2010-12-21 ENCOUNTER — Ambulatory Visit (INDEPENDENT_AMBULATORY_CARE_PROVIDER_SITE_OTHER): Payer: Medicare Other | Admitting: Internal Medicine

## 2010-12-21 DIAGNOSIS — E1149 Type 2 diabetes mellitus with other diabetic neurological complication: Secondary | ICD-10-CM

## 2010-12-21 DIAGNOSIS — C61 Malignant neoplasm of prostate: Secondary | ICD-10-CM

## 2010-12-21 DIAGNOSIS — K6289 Other specified diseases of anus and rectum: Secondary | ICD-10-CM

## 2010-12-21 NOTE — Progress Notes (Signed)
  Subjective:    Patient ID: [name] [name], male    DOB: 1930-02-05, 77 y.o.   MRN: [id number]  HPI  77 year old patient who is seen today for followup. He continues to be severely symptomatic with left leg pain. He is status post external beam radiation for prostate cancer and his pain syndrome initially started out as perirectal and perineal. He continues to have progressive pain and now involves the left  hamstring and calf area. Remains on maintenance and when necessary analgesics as well as Lyrica. His fentanyl has been up titrated to 50 which he tolerates well. Thankfully most of his pain occurs late at night and he functions well during the day. Those titration was discussed today. Will recheck in one month and consider further titration at bedtime if needed. His diabetes remains well controlled. Hemoglobin A1c 2 weeks ago 6.1  Review of Systems  Musculoskeletal: Positive for gait problem.       Objective:   Physical Exam  Psychiatric: He has a normal mood and affect. His behavior is normal. Judgment and thought content normal.          Assessment & Plan:   Neuropathic left leg pain. 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 Cannot rule out atypical diabetic neuropathic pain. No evidence of structural lesion  We'll continue present analgesic regimen. He will follow up with neurology next week. Return here in one month for consideration of further dose titration. Might consider referral to pain management

## 2010-12-21 NOTE — Patient Instructions (Signed)
Return office visit one month

## 2011-01-03 ENCOUNTER — Encounter: Payer: Self-pay | Admitting: Internal Medicine

## 2011-01-03 ENCOUNTER — Ambulatory Visit (INDEPENDENT_AMBULATORY_CARE_PROVIDER_SITE_OTHER): Payer: Medicare Other | Admitting: Internal Medicine

## 2011-01-03 ENCOUNTER — Telehealth: Payer: Self-pay | Admitting: Gastroenterology

## 2011-01-03 DIAGNOSIS — E119 Type 2 diabetes mellitus without complications: Secondary | ICD-10-CM

## 2011-01-03 DIAGNOSIS — K6289 Other specified diseases of anus and rectum: Secondary | ICD-10-CM

## 2011-01-03 LAB — GLUCOSE, POCT (MANUAL RESULT ENTRY): POC Glucose: 105

## 2011-01-03 NOTE — Patient Instructions (Signed)
Neurology followup and referral to pain clinic as discussed Decrease Tapentadol to one half tablet every 6 hours as needed for pain

## 2011-01-03 NOTE — Telephone Encounter (Signed)
Pt called to report terrible rectal pain since last pm. Last OV 05/01/2010 at which time pt was referred to Dr Arvid Right in [facility]. He was placed on Fentanyl patch q72h that Dr Farrell Ours increased in June to Fentanyl q72hr.  I spoke with Mike Gip, PA since Dr Jarold Motto is not here. She will give some hydrcodone if needed for short term pain relief, but she prefers not to deal with the Fentanyl. I spoke with Eunice Blase, Triage nurse at [facility] who will take over the call. Spoke with pt's granddaughter who will inform the pt Dr Charm Rings office will handle his problem.

## 2011-01-03 NOTE — Progress Notes (Signed)
  Subjective:    Patient ID: Cathlean Sauer, male    DOB: [birth year], 77 y.o.   MRN: [id number]  HPI  77 year old patient who is seen today accompanied by his daughter for followup. Last night, he had significant rectal pain that awoke him at 2 AM. At that time he took his when necessary analgesic and awoke quite groggy and unsteady.  He has felt poorly throughout the day and has not gone to work and spent most the day in bed. He has no significant pain at present. His pain generally is worse at night and more tolerable during the day. He is on a fentanyl patch as well as when necessary analgesia. He takes Lyrica daily he is scheduled for neurology appointment early Monday morning  His diabetes remains quite stable    Review of Systems     Objective:   Physical Exam        Assessment & Plan:   Chronic perineal pain. Followup neurology as planned. Referral to pain management discussed and the patient is agreeable

## 2011-01-08 ENCOUNTER — Encounter: Payer: Self-pay | Admitting: Gastroenterology

## 2011-01-15 ENCOUNTER — Other Ambulatory Visit: Payer: Self-pay | Admitting: Neurology

## 2011-01-15 ENCOUNTER — Ambulatory Visit
Admission: RE | Admit: 2011-01-15 | Discharge: 2011-01-15 | Disposition: A | Discharge status: Home / Self Care | Payer: Medicare Other | Source: Ambulatory Visit | Attending: Neurology | Admitting: Neurology

## 2011-01-15 DIAGNOSIS — G834 Cauda equina syndrome: Secondary | ICD-10-CM

## 2011-01-15 DIAGNOSIS — M79609 Pain in unspecified limb: Secondary | ICD-10-CM

## 2011-01-15 MED ORDER — IOHEXOL 300 MG/ML  SOLN: 100.0000 mL | Freq: Once | INTRAMUSCULAR | Status: AC | PRN

## 2011-01-16 ENCOUNTER — Ambulatory Visit (AMBULATORY_SURGERY_CENTER): Payer: Medicare Other | Admitting: *Deleted

## 2011-01-16 ENCOUNTER — Telehealth: Payer: Self-pay | Admitting: Gastroenterology

## 2011-01-16 VITALS — Ht 67.0 in | Wt 176.7 lb

## 2011-01-16 DIAGNOSIS — Z1211 Encounter for screening for malignant neoplasm of colon: Secondary | ICD-10-CM

## 2011-01-16 MED ORDER — MOVIPREP 100 G PO SOLR: ORAL | Status: DC

## 2011-01-17 ENCOUNTER — Encounter: Payer: Self-pay | Admitting: Gastroenterology

## 2011-01-18 ENCOUNTER — Ambulatory Visit (INDEPENDENT_AMBULATORY_CARE_PROVIDER_SITE_OTHER): Payer: Medicare Other | Admitting: Internal Medicine

## 2011-01-18 ENCOUNTER — Encounter: Payer: Self-pay | Admitting: Internal Medicine

## 2011-01-18 DIAGNOSIS — E119 Type 2 diabetes mellitus without complications: Secondary | ICD-10-CM

## 2011-01-18 DIAGNOSIS — D6489 Other specified anemias: Secondary | ICD-10-CM

## 2011-01-18 DIAGNOSIS — Z8546 Personal history of malignant neoplasm of prostate: Secondary | ICD-10-CM

## 2011-01-18 DIAGNOSIS — M549 Dorsalgia, unspecified: Secondary | ICD-10-CM

## 2011-01-18 DIAGNOSIS — E039 Hypothyroidism, unspecified: Secondary | ICD-10-CM

## 2011-01-18 LAB — CBC WITH DIFFERENTIAL/PLATELET
Basophils Absolute: 0 10*3/uL (ref 0.0–0.1)
Basophils Relative: 0 % (ref 0–1)
HCT: 34.7 % — ABNORMAL LOW (ref 39.0–52.0)
Lymphocytes Relative: 22 % (ref 12–46)
MCHC: 32.9 g/dL (ref 30.0–36.0)
Monocytes Absolute: 0.5 10*3/uL (ref 0.1–1.0)
Neutro Abs: 3.1 10*3/uL (ref 1.7–7.7)
Neutrophils Relative %: 64 % (ref 43–77)
Platelets: 178 10*3/uL (ref 150–400)
RDW: 13.5 % (ref 11.5–15.5)
WBC: 4.8 10*3/uL (ref 4.0–10.5)

## 2011-01-18 LAB — BASIC METABOLIC PANEL
Calcium: 9.4 mg/dL (ref 8.4–10.5)
Creat: 1.6 mg/dL — ABNORMAL HIGH (ref 0.50–1.35)
Glucose, Bld: 126 mg/dL — ABNORMAL HIGH (ref 70–99)
Sodium: 143 mEq/L (ref 135–145)

## 2011-01-18 LAB — HEPATIC FUNCTION PANEL
Bilirubin, Direct: 0.1 mg/dL (ref 0.0–0.3)
Indirect Bilirubin: 0.3 mg/dL (ref 0.0–0.9)
Total Bilirubin: 0.4 mg/dL (ref 0.3–1.2)

## 2011-01-18 MED ORDER — FENTANYL 25 MCG/HR TD PT72: 1.0000 | MEDICATED_PATCH | TRANSDERMAL | Status: DC

## 2011-01-18 MED ORDER — DONEPEZIL HCL 10 MG PO TABS: 10.0000 mg | ORAL_TABLET | Freq: Every day | ORAL | Status: DC

## 2011-01-18 NOTE — Progress Notes (Signed)
  Subjective:    Patient ID: [name] [name], male    DOB: February 23, 1930, 77 y.o.   MRN: [id number]  HPI  77 year old patient who is seen today for followup. He is status post IMRT in March of 2007 for prostate cancer. He received Lupron until December of 2008 and his last PSA was 4 in September of last year. He is scheduled for urological followup soon. Approximately one year ago he had the onset of rectal and testicular pain. This has progressed and now has left leg pain and weakness.  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 A recent followup abdominal CT scan revealed a persistent lesion in the vicinity of the lesser curvature of the stomach. He is scheduled for GI followup next week to include upper and lower panendoscopy.  In general he is somewhat better although he still has considerable pain;  he remains on fentanyl patch as well as when necessary narcotic analgesics. Laboratory studies in the spring revealed a mild pancytopenia. He continues to have episodic constipation that alternates with diarrhea. He often must wear an adult  diaper for incontinence  Review of Systems  Constitutional: Positive for fatigue. Negative for fever, chills and appetite change.  HENT: Negative for hearing loss, ear pain, congestion, sore throat, trouble swallowing, neck stiffness, dental problem, voice change and tinnitus.   Eyes: Negative for pain, discharge and visual disturbance.  Respiratory: Negative for cough, chest tightness, wheezing and stridor.   Cardiovascular: Negative for chest pain, palpitations and leg swelling.  Gastrointestinal: Positive for constipation and rectal pain. Negative for nausea, vomiting, abdominal pain, diarrhea, blood in stool and abdominal distention.  Genitourinary: Negative for urgency, hematuria, flank pain, discharge, difficulty urinating and genital sores.  Musculoskeletal: Positive for gait problem. Negative for  myalgias, back pain, joint swelling and arthralgias.  Skin: Negative for rash.  Neurological: Negative for dizziness, syncope, speech difficulty, weakness, numbness and headaches.  Hematological: Negative for adenopathy. Does not bruise/bleed easily.  Psychiatric/Behavioral: Negative for behavioral problems and dysphoric mood. The patient is not nervous/anxious.        Objective:   Physical Exam  Constitutional: He is oriented to person, place, and time. He appears well-developed.  HENT:  Head: Normocephalic.  Right Ear: External ear normal.  Left Ear: External ear normal.  Eyes: Conjunctivae and EOM are normal.  Neck: Normal range of motion.  Cardiovascular: Normal rate and normal heart sounds.   Pulmonary/Chest: Breath sounds normal.  Abdominal: Bowel sounds are normal.  Musculoskeletal: Normal range of motion. He exhibits no edema and no tenderness.  Neurological: He is alert and oriented to person, place, and time.       Walks with a limp Slight weakness in left ankle flexion and extension Absent left Achilles reflex  Psychiatric: He has a normal mood and affect. His behavior is normal.          Assessment & Plan:   Prostate cancer. We'll check a PSA and bone scan. Urology followup as scheduled History pancytopenia we'll recheck CBC as well as chemistries. Consider oncology referral Diabetes mellitus. Stable

## 2011-01-18 NOTE — Patient Instructions (Signed)
Urology followup as scheduled Bone scan as discussed  Return office visit in 2 months  GI and neurology followup

## 2011-01-19 LAB — PSA: PSA: 16.2 ng/mL — ABNORMAL HIGH (ref <=4.00)

## 2011-01-21 ENCOUNTER — Emergency Department (HOSPITAL_COMMUNITY): Payer: Medicare Other

## 2011-01-21 ENCOUNTER — Emergency Department (HOSPITAL_COMMUNITY)
Admission: EM | Admit: 2011-01-21 | Discharge: 2011-01-22 | Disposition: A | Discharge status: Home / Self Care | Payer: Medicare Other | Attending: Emergency Medicine | Admitting: Emergency Medicine

## 2011-01-21 DIAGNOSIS — Z79899 Other long term (current) drug therapy: Secondary | ICD-10-CM | POA: Insufficient documentation

## 2011-01-21 DIAGNOSIS — T50991A Poisoning by other drugs, medicaments and biological substances, accidental (unintentional), initial encounter: Secondary | ICD-10-CM | POA: Insufficient documentation

## 2011-01-21 DIAGNOSIS — R4182 Altered mental status, unspecified: Secondary | ICD-10-CM | POA: Insufficient documentation

## 2011-01-21 DIAGNOSIS — K6289 Other specified diseases of anus and rectum: Secondary | ICD-10-CM | POA: Insufficient documentation

## 2011-01-21 DIAGNOSIS — E785 Hyperlipidemia, unspecified: Secondary | ICD-10-CM | POA: Insufficient documentation

## 2011-01-21 DIAGNOSIS — G8929 Other chronic pain: Secondary | ICD-10-CM | POA: Insufficient documentation

## 2011-01-21 DIAGNOSIS — E119 Type 2 diabetes mellitus without complications: Secondary | ICD-10-CM | POA: Insufficient documentation

## 2011-01-21 DIAGNOSIS — I2510 Atherosclerotic heart disease of native coronary artery without angina pectoris: Secondary | ICD-10-CM | POA: Insufficient documentation

## 2011-01-21 DIAGNOSIS — I10 Essential (primary) hypertension: Secondary | ICD-10-CM | POA: Insufficient documentation

## 2011-01-21 DIAGNOSIS — IMO0002 Reserved for concepts with insufficient information to code with codable children: Secondary | ICD-10-CM | POA: Insufficient documentation

## 2011-01-21 DIAGNOSIS — R404 Transient alteration of awareness: Secondary | ICD-10-CM | POA: Insufficient documentation

## 2011-01-21 DIAGNOSIS — Z951 Presence of aortocoronary bypass graft: Secondary | ICD-10-CM | POA: Insufficient documentation

## 2011-01-21 LAB — ACETAMINOPHEN LEVEL: Acetaminophen (Tylenol), Serum: <15 ug/mL (ref 10–30)

## 2011-01-21 LAB — COMPREHENSIVE METABOLIC PANEL
ALT: 17 U/L (ref 0–53)
AST: 19 U/L (ref 0–37)
Albumin: 4 g/dL (ref 3.5–5.2)
CO2: 27 mEq/L (ref 19–32)
Calcium: 10.4 mg/dL (ref 8.4–10.5)
GFR calc non Af Amer: 43 mL/min — ABNORMAL LOW (ref >60)
Sodium: 140 mEq/L (ref 135–145)

## 2011-01-21 LAB — DIFFERENTIAL
Basophils Relative: 0 % (ref 0–1)
Eosinophils Absolute: 0.3 10*3/uL (ref 0.0–0.7)
Eosinophils Relative: 5 % (ref 0–5)
Lymphs Abs: 1.6 10*3/uL (ref 0.7–4.0)
Monocytes Relative: 11 % (ref 3–12)
Neutrophils Relative %: 58 % (ref 43–77)

## 2011-01-21 LAB — CBC
MCH: 32 pg (ref 26.0–34.0)
MCV: 91.1 fL (ref 78.0–100.0)
Platelets: 164 10*3/uL (ref 150–400)
RBC: 3.59 MIL/uL — ABNORMAL LOW (ref 4.22–5.81)
RDW: 13.4 % (ref 11.5–15.5)
WBC: 5.9 10*3/uL (ref 4.0–10.5)

## 2011-01-21 LAB — URINALYSIS, ROUTINE W REFLEX MICROSCOPIC
Glucose, UA: NEGATIVE mg/dL
Ketones, ur: NEGATIVE mg/dL
Leukocytes, UA: NEGATIVE
Specific Gravity, Urine: 1.019 (ref 1.005–1.030)
pH: 6 (ref 5.0–8.0)

## 2011-01-21 LAB — POCT I-STAT, CHEM 8
BUN: 26 mg/dL — ABNORMAL HIGH (ref 6–23)
Chloride: 106 mEq/L (ref 96–112)
Sodium: 143 mEq/L (ref 135–145)
TCO2: 26 mmol/L (ref 0–100)

## 2011-01-21 LAB — RAPID URINE DRUG SCREEN, HOSP PERFORMED
Amphetamines: NOT DETECTED
Benzodiazepines: NOT DETECTED
Cocaine: NOT DETECTED
Opiates: NOT DETECTED

## 2011-01-29 ENCOUNTER — Encounter (HOSPITAL_COMMUNITY): Payer: Medicare Other

## 2011-01-29 ENCOUNTER — Ambulatory Visit (HOSPITAL_COMMUNITY): Payer: Medicare Other

## 2011-01-30 ENCOUNTER — Encounter: Payer: Self-pay | Admitting: Gastroenterology

## 2011-01-30 ENCOUNTER — Ambulatory Visit (AMBULATORY_SURGERY_CENTER): Payer: Medicare Other | Admitting: Gastroenterology

## 2011-01-30 VITALS — BP 99/74 | HR 63 | Temp 97.4°F | Resp 18 | Ht 67.0 in | Wt 176.0 lb

## 2011-01-30 DIAGNOSIS — K594 Anal spasm: Secondary | ICD-10-CM

## 2011-01-30 DIAGNOSIS — Z1211 Encounter for screening for malignant neoplasm of colon: Secondary | ICD-10-CM

## 2011-01-30 DIAGNOSIS — R1900 Intra-abdominal and pelvic swelling, mass and lump, unspecified site: Secondary | ICD-10-CM

## 2011-01-30 DIAGNOSIS — K2970 Gastritis, unspecified, without bleeding: Secondary | ICD-10-CM

## 2011-01-30 DIAGNOSIS — Z86010 Personal history of colonic polyps: Secondary | ICD-10-CM

## 2011-01-30 DIAGNOSIS — K589 Irritable bowel syndrome without diarrhea: Secondary | ICD-10-CM

## 2011-01-30 DIAGNOSIS — R109 Unspecified abdominal pain: Secondary | ICD-10-CM | POA: Insufficient documentation

## 2011-01-30 DIAGNOSIS — K6289 Other specified diseases of anus and rectum: Secondary | ICD-10-CM

## 2011-01-30 DIAGNOSIS — R935 Abnormal findings on diagnostic imaging of other abdominal regions, including retroperitoneum: Secondary | ICD-10-CM

## 2011-01-30 DIAGNOSIS — K3189 Other diseases of stomach and duodenum: Secondary | ICD-10-CM

## 2011-01-30 DIAGNOSIS — K319 Disease of stomach and duodenum, unspecified: Secondary | ICD-10-CM

## 2011-01-30 MED ORDER — SODIUM CHLORIDE 0.9 % IV SOLN: 500.0000 mL | INTRAVENOUS | Status: DC

## 2011-01-30 NOTE — Patient Instructions (Signed)
Follow discharge instriuctions.  Continue your medications.  Dr. Norval Gable office will call you with an appointment for Endoscopic Ultrasound with Dr. Christella Hartigan.  Await pathology results.

## 2011-01-30 NOTE — Progress Notes (Signed)
Patient and carepartner to bathroom prior to discharge.

## 2011-01-31 ENCOUNTER — Encounter: Payer: Self-pay | Admitting: Gastroenterology

## 2011-01-31 ENCOUNTER — Telehealth: Payer: Self-pay | Admitting: *Deleted

## 2011-01-31 ENCOUNTER — Telehealth: Payer: Self-pay

## 2011-01-31 DIAGNOSIS — K6289 Other specified diseases of anus and rectum: Secondary | ICD-10-CM

## 2011-01-31 DIAGNOSIS — K900 Celiac disease: Secondary | ICD-10-CM

## 2011-01-31 DIAGNOSIS — K2990 Gastroduodenitis, unspecified, without bleeding: Secondary | ICD-10-CM

## 2011-01-31 DIAGNOSIS — K2970 Gastritis, unspecified, without bleeding: Secondary | ICD-10-CM

## 2011-01-31 NOTE — Telephone Encounter (Signed)

## 2011-01-31 NOTE — Telephone Encounter (Signed)
Notified pt that Dr Christella Hartigan suggested we wait on the biopsy results and then he and Dr Jarold Motto will decide on EUS with FNA. Dr Jarold Motto has ordered the Biopsy results STAT; pt stated understanding.

## 2011-02-01 ENCOUNTER — Other Ambulatory Visit: Payer: Self-pay | Admitting: *Deleted

## 2011-02-01 MED ORDER — LEVOTHYROXINE SODIUM 50 MCG PO TABS: 50.0000 ug | ORAL_TABLET | Freq: Every day | ORAL | Status: DC

## 2011-02-05 ENCOUNTER — Ambulatory Visit (HOSPITAL_COMMUNITY): Payer: Medicare Other

## 2011-02-05 ENCOUNTER — Telehealth: Payer: Self-pay | Admitting: Gastroenterology

## 2011-02-05 ENCOUNTER — Encounter (HOSPITAL_COMMUNITY): Payer: Medicare Other

## 2011-02-05 NOTE — Telephone Encounter (Signed)
Advised daughter, Cordelia Pen she is on her Dad's Hippa list.  The biopsy was not definite and an EUS with biopsy is needed. I have sent a note to Dr Christella Hartigan to see where we are in scheduling and I will call her back.

## 2011-02-05 NOTE — Telephone Encounter (Signed)
[name] [name] is on her dad's Hippa form. lmom to call back.

## 2011-02-06 ENCOUNTER — Telehealth: Payer: Self-pay | Admitting: *Deleted

## 2011-02-06 NOTE — Telephone Encounter (Signed)
Pt scheduled for Upper EUS, radial and linear, 60 min. With Propofol at [facility] on 02/21/11 at 11am. Mailed pt instructions for his procedure including diabetic instructions; per chart, pt is on Amaryl and Glucophage. He is also on Anaprox and ASA 81mg .

## 2011-02-06 NOTE — Telephone Encounter (Signed)
Would stop the anaprox for 5 days, ok to stay on the asa  ----- Message ----- From: Linna Hoff, RN Sent: 02/06/2011 9:25 AM To: Rob Bunting, MD  Pt is on Anaprox 220mg  2x a day with meals and ASA 81mg  daily. Should these be held for Upper EUS and if so, how long? Thanks, Aram Beecham.      Put in pt's instructions to stop the Anaprox 5 days prior to EUS; ok to remain on his ASA.

## 2011-02-06 NOTE — Telephone Encounter (Signed)
Pt's daughter sherry, lmom to main a copy of prep instructions and path report to: [address]  [place], [place] [address]. Mailed.

## 2011-02-06 NOTE — Telephone Encounter (Signed)
Message copied by Florene Glen on Wed Feb 06, 2011  8:52 AM ------      Message from: Harlow Mares D      Created: Wed Feb 06, 2011  8:47 AM                   ----- Message -----         From: Rob Bunting, MD         Sent: 02/01/2011  11:32 AM           To: Sheryn Bison, MD, Linna Hoff, RN, #            Patty, this patient needs upper EUS, radial and linear, 60 min, ++propofol, next available [facility] apt for gastric mass            thanks                  ----- Message -----         From: Linna Hoff, RN         Sent: 02/01/2011   8:43 AM           To: Rob Bunting, MD                        ----- Message -----         From: Sheryn Bison, MD         Sent: 02/01/2011   8:38 AM           To: Linna Hoff, RN, Harlow Mares, CMA            Copy Dr. Christella Hartigan. I think this patient will need EUS and better biopsies of this mass

## 2011-02-07 ENCOUNTER — Other Ambulatory Visit: Payer: Self-pay | Admitting: Gastroenterology

## 2011-02-07 ENCOUNTER — Encounter (HOSPITAL_COMMUNITY)
Admission: RE | Admit: 2011-02-07 | Discharge: 2011-02-07 | Disposition: A | Discharge status: Home / Self Care | Payer: Medicare Other | Source: Ambulatory Visit | Attending: Internal Medicine | Admitting: Internal Medicine

## 2011-02-07 ENCOUNTER — Ambulatory Visit (HOSPITAL_COMMUNITY)
Admission: RE | Admit: 2011-02-07 | Discharge: 2011-02-07 | Disposition: A | Discharge status: Home / Self Care | Payer: Medicare Other | Source: Ambulatory Visit | Attending: Internal Medicine | Admitting: Internal Medicine

## 2011-02-07 DIAGNOSIS — R972 Elevated prostate specific antigen [PSA]: Secondary | ICD-10-CM | POA: Insufficient documentation

## 2011-02-07 DIAGNOSIS — N2889 Other specified disorders of kidney and ureter: Secondary | ICD-10-CM | POA: Insufficient documentation

## 2011-02-07 DIAGNOSIS — K3189 Other diseases of stomach and duodenum: Secondary | ICD-10-CM

## 2011-02-07 DIAGNOSIS — Z8546 Personal history of malignant neoplasm of prostate: Secondary | ICD-10-CM | POA: Insufficient documentation

## 2011-02-07 MED ORDER — TECHNETIUM TC 99M MEDRONATE IV KIT
22.7000 | PACK | Freq: Once | INTRAVENOUS | Status: AC | PRN
  Administered 2011-02-07: 22.7 via INTRAVENOUS

## 2011-02-11 ENCOUNTER — Telehealth: Payer: Self-pay | Admitting: Gastroenterology

## 2011-02-11 NOTE — Telephone Encounter (Signed)
Lm on pt's voice mail to call back.

## 2011-02-12 NOTE — Telephone Encounter (Signed)
Pt lost hid EGD instructions; faxed instructions to his office.

## 2011-02-20 ENCOUNTER — Telehealth: Payer: Self-pay | Admitting: Internal Medicine

## 2011-02-20 MED ORDER — FENTANYL 25 MCG/HR TD PT72: 1.0000 | MEDICATED_PATCH | TRANSDERMAL | Status: DC

## 2011-02-20 NOTE — Telephone Encounter (Signed)
Per Dr Caryl Never, OK to fill #[id number].  Son informed Rx will be ready in 30 min per [facility]

## 2011-02-20 NOTE — Telephone Encounter (Signed)
Pt requesting refill on    fentaNYL (DURAGESIC - DOSED MCG/HR) 25 MCG/HR   Please contact when ready to pick up.

## 2011-02-21 ENCOUNTER — Other Ambulatory Visit: Payer: Self-pay | Admitting: Gastroenterology

## 2011-02-21 ENCOUNTER — Encounter: Payer: Medicare Other | Admitting: Gastroenterology

## 2011-02-21 ENCOUNTER — Ambulatory Visit (HOSPITAL_COMMUNITY)
Admission: RE | Admit: 2011-02-21 | Discharge: 2011-02-21 | Disposition: A | Discharge status: Home / Self Care | Payer: Medicare Other | Source: Ambulatory Visit | Attending: Gastroenterology | Admitting: Gastroenterology

## 2011-02-21 DIAGNOSIS — Z951 Presence of aortocoronary bypass graft: Secondary | ICD-10-CM | POA: Insufficient documentation

## 2011-02-21 DIAGNOSIS — I129 Hypertensive chronic kidney disease with stage 1 through stage 4 chronic kidney disease, or unspecified chronic kidney disease: Secondary | ICD-10-CM | POA: Insufficient documentation

## 2011-02-21 DIAGNOSIS — I2510 Atherosclerotic heart disease of native coronary artery without angina pectoris: Secondary | ICD-10-CM | POA: Insufficient documentation

## 2011-02-21 DIAGNOSIS — K2960 Other gastritis without bleeding: Secondary | ICD-10-CM | POA: Insufficient documentation

## 2011-02-21 DIAGNOSIS — I252 Old myocardial infarction: Secondary | ICD-10-CM | POA: Insufficient documentation

## 2011-02-21 DIAGNOSIS — N189 Chronic kidney disease, unspecified: Secondary | ICD-10-CM | POA: Insufficient documentation

## 2011-02-21 DIAGNOSIS — E119 Type 2 diabetes mellitus without complications: Secondary | ICD-10-CM | POA: Insufficient documentation

## 2011-02-21 DIAGNOSIS — K319 Disease of stomach and duodenum, unspecified: Secondary | ICD-10-CM | POA: Insufficient documentation

## 2011-02-21 DIAGNOSIS — R933 Abnormal findings on diagnostic imaging of other parts of digestive tract: Secondary | ICD-10-CM

## 2011-02-21 LAB — GLUCOSE, CAPILLARY: Glucose-Capillary: 135 mg/dL — ABNORMAL HIGH (ref 70–99)

## 2011-03-01 ENCOUNTER — Telehealth: Payer: Self-pay | Admitting: *Deleted

## 2011-03-01 ENCOUNTER — Other Ambulatory Visit: Payer: Self-pay

## 2011-03-01 ENCOUNTER — Other Ambulatory Visit: Payer: Self-pay | Admitting: Internal Medicine

## 2011-03-01 MED ORDER — TAPENTADOL HCL 100 MG PO TABS: 1.0000 | ORAL_TABLET | Freq: Three times a day (TID) | ORAL | Status: DC

## 2011-03-01 NOTE — Telephone Encounter (Signed)
Message copied by Florene Glen on Fri Mar 01, 2011  9:06 AM ------      Message from: Mardella Layman      Created: Fri Mar 01, 2011  8:28 AM       Surgical appointment. Copy to Dr. gross and Dr.Kwiatowski N. Dr. Avie Echevaria in neurology

## 2011-03-01 NOTE — Telephone Encounter (Signed)
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 Pt stated he has no intention of having this problem looked after until he has resolution his rectal pain, at least what is causing it and finding a tx other than treating him with narcotics. He stated he spoke with Dr Christella Hartigan who stated " the worst that can happen is the tumor can burst and he would get anemia". So, he doesn't feel as though the GIST is important. Then pt remarked he can't make a Wednesday appt d/t being in [employer] that day. I will r/s. Pt also reports his wife had a CVA and fell and broke her hip and is in [facility]. Dr Jarold Motto, please advise; is there anything else I can tell pt to encourage him to see Dr Michaell Cowing? Thanks.

## 2011-03-01 NOTE — Telephone Encounter (Signed)
Tapentadol id listed as PRN and TID - fill both?

## 2011-03-01 NOTE — Telephone Encounter (Signed)
Pt called said he wanted a refill on "Nucyantia" had him repeat it and spell it not sure what medication this is he said it was for pain. Pt is requesting refill. Please contact pt with any questions.

## 2011-03-01 NOTE — Telephone Encounter (Signed)
Corrected quanity

## 2011-03-01 NOTE — Telephone Encounter (Signed)
Not an emergency.Copy dr.JAMES LOVE.Marland KitchenMarland KitchenMarland Kitchen

## 2011-03-01 NOTE — Telephone Encounter (Signed)
[name] [name] for pt and his daughter Cordelia Pen to call back concerning his appt with Dr Michaell Cowing.

## 2011-03-01 NOTE — Telephone Encounter (Signed)
Spoke with [place] at [facility] and scheduled pt to see Dr Michaell Cowing on Wed. Sept 12, 2012 at 1030am for an 1100am visit. Notified pt and his daughter Cordelia Pen. Sent notes to DR Gross, Dr Lesia Hausen and Dr Sandria Manly.

## 2011-03-01 NOTE — Telephone Encounter (Signed)
Pt aware ready for pick up 

## 2011-03-01 NOTE — Telephone Encounter (Signed)
RF  TID  #90

## 2011-03-01 NOTE — Telephone Encounter (Signed)
Message copied by Florene Glen on Fri Mar 01, 2011  9:07 AM ------      Message from: Mardella Layman      Created: Fri Mar 01, 2011  8:28 AM       Surgical appointment. Copy to Dr. gross and Dr.Kwiatowski N. Dr. Avie Echevaria in neurology

## 2011-03-01 NOTE — Telephone Encounter (Signed)
Left a message at pt's ofc that I changed his appt with Dr Michaell Cowing to Monday, March 18, 2011, at [facility] for a 0930am appt.

## 2011-03-06 ENCOUNTER — Telehealth: Payer: Self-pay | Admitting: *Deleted

## 2011-03-06 NOTE — Telephone Encounter (Signed)
Pt lmom requesting I cancel his appt at [facility] with Dr Michaell Cowing. Pt stated he will not see any surgeon until after his visit with Dr Sandria Manly. He stated he will let us know and he spoke with Dr Lovell Sheehan and his GIST is not a problem at this time. Called [facility] and pt had already called them to cancel.

## 2011-03-13 ENCOUNTER — Ambulatory Visit (INDEPENDENT_AMBULATORY_CARE_PROVIDER_SITE_OTHER): Payer: Medicare Other | Admitting: Surgery

## 2011-03-18 ENCOUNTER — Ambulatory Visit (INDEPENDENT_AMBULATORY_CARE_PROVIDER_SITE_OTHER): Payer: Medicare Other | Admitting: Surgery

## 2011-03-21 ENCOUNTER — Encounter: Payer: Self-pay | Admitting: Internal Medicine

## 2011-03-21 ENCOUNTER — Ambulatory Visit (INDEPENDENT_AMBULATORY_CARE_PROVIDER_SITE_OTHER): Payer: Medicare Other | Admitting: Internal Medicine

## 2011-03-21 VITALS — BP 120/70 | Temp 97.5°F | Wt 171.0 lb

## 2011-03-21 DIAGNOSIS — Z23 Encounter for immunization: Secondary | ICD-10-CM

## 2011-03-21 DIAGNOSIS — Z0000 Encounter for general adult medical examination without abnormal findings: Secondary | ICD-10-CM

## 2011-03-21 DIAGNOSIS — Z8546 Personal history of malignant neoplasm of prostate: Secondary | ICD-10-CM

## 2011-03-21 DIAGNOSIS — E119 Type 2 diabetes mellitus without complications: Secondary | ICD-10-CM

## 2011-03-21 DIAGNOSIS — K6289 Other specified diseases of anus and rectum: Secondary | ICD-10-CM

## 2011-03-21 MED ORDER — TAPENTADOL HCL 100 MG PO TABS: 1.0000 | ORAL_TABLET | Freq: Three times a day (TID) | ORAL | Status: DC

## 2011-03-21 MED ORDER — FENTANYL 50 MCG/HR TD PT72: 1.0000 | MEDICATED_PATCH | TRANSDERMAL | Status: DC

## 2011-03-21 NOTE — Progress Notes (Signed)
  Subjective:    Patient ID: [name] [name], male    DOB: 1929/11/13, 76 y.o.   MRN: [id number]  HPI  77 year old patient who is seen today for followup. He is accompanied by his daughter. He has been followed and treated closely by urology for metastatic prostate cancer it is felt this because of his perineal and left leg pain. He now describes pain involving both feet the left greater than the right. He is on hormone therapy for his prostate cancer and his pain may be improving. He has diabetes which has been stable. There is very little home monitor at the present time due to this pain and discomfort. His last hemoglobin A1c 6.1. He has been evaluated by GI and has been diagnosed with a GIST tumor. He will consider more definitive treatment in the future once his prostate cancer and pain is better controlled he has coronary artery disease hypothyroidism and hypertension    Review of Systems  Constitutional: Negative for fever, chills, appetite change and fatigue.  HENT: Negative for hearing loss, ear pain, congestion, sore throat, trouble swallowing, neck stiffness, dental problem, voice change and tinnitus.   Eyes: Negative for pain, discharge and visual disturbance.  Respiratory: Negative for cough, chest tightness, wheezing and stridor.   Cardiovascular: Negative for chest pain, palpitations and leg swelling.  Gastrointestinal: Negative for nausea, vomiting, abdominal pain, diarrhea, constipation, blood in stool and abdominal distention.  Genitourinary: Negative for urgency, hematuria, flank pain, discharge, difficulty urinating and genital sores.  Musculoskeletal: Positive for back pain. Negative for myalgias, joint swelling, arthralgias and gait problem.  Skin: Negative for rash.  Neurological: Negative for dizziness, syncope, speech difficulty, weakness, numbness and headaches.       Severe perineal and left leg pain  Hematological: Negative for adenopathy. Does not bruise/bleed easily.    Psychiatric/Behavioral: Negative for behavioral problems and dysphoric mood. The patient is not nervous/anxious.        Objective:   Physical Exam  Constitutional: He is oriented to person, place, and time. He appears well-developed and well-nourished. No distress.  HENT:  Head: Normocephalic.  Right Ear: External ear normal.  Left Ear: External ear normal.  Eyes: Conjunctivae and EOM are normal.  Neck: Normal range of motion.  Cardiovascular: Normal rate and normal heart sounds.   Pulmonary/Chest: Breath sounds normal.  Abdominal: Bowel sounds are normal.  Musculoskeletal: Normal range of motion. He exhibits no edema and no tenderness.       Straight leg test aggravated  his left foot pain  Neurological: He is alert and oriented to person, place, and time.  Psychiatric: He has a normal mood and affect. His behavior is normal.          Assessment & Plan:   Metastatic prostate cancer. Per urology Diabetes. We'll check a hemoglobin A1c Hypertension stable  Recheck one month Fentanyl dose will be increased to 50. Medications refilled

## 2011-03-21 NOTE — Patient Instructions (Signed)
Limit your sodium (Salt) intake  Return in one month for follow-up 

## 2011-03-22 LAB — I-STAT 8, (EC8 V) (CONVERTED LAB)
Acid-base deficit: 1
Acid-base deficit: 1
Bicarbonate: 23.7
Bicarbonate: 24
Glucose, Bld: 100 — ABNORMAL HIGH
HCT: 31 — ABNORMAL LOW
HCT: 35 — ABNORMAL LOW
Hemoglobin: 10.5 — ABNORMAL LOW
Operator id: 128471
Operator id: 234501
Potassium: 4.3
Sodium: 139
TCO2: 25
TCO2: 25
pCO2, Ven: 38.9 — ABNORMAL LOW
pH, Ven: 7.394 — ABNORMAL HIGH

## 2011-03-22 LAB — BASIC METABOLIC PANEL
BUN: 13
Calcium: 8.8
GFR calc non Af Amer: 51 — ABNORMAL LOW
Glucose, Bld: 81
Sodium: 140

## 2011-03-22 LAB — CARDIAC PANEL(CRET KIN+CKTOT+MB+TROPI)
Relative Index: INVALID
Relative Index: INVALID
Total CK: 97
Troponin I: 1.73

## 2011-03-22 LAB — CBC
HCT: 31.3 — ABNORMAL LOW
Hemoglobin: 11.1 — ABNORMAL LOW
MCHC: 35.4
RDW: 13.7

## 2011-03-22 LAB — CK TOTAL AND CKMB (NOT AT ARMC): Relative Index: INVALID

## 2011-03-22 LAB — POCT CARDIAC MARKERS
CKMB, poc: <1 — ABNORMAL LOW
Myoglobin, poc: 44.5
Troponin i, poc: <0.05

## 2011-03-27 LAB — CBC
HCT: 30 — ABNORMAL LOW
Hemoglobin: 10.5 — ABNORMAL LOW
MCV: 93.5
RDW: 14.6

## 2011-03-27 LAB — BASIC METABOLIC PANEL
CO2: 24
Chloride: 107
Creatinine, Ser: 1.24
GFR calc Af Amer: >60
GFR calc non Af Amer: 57 — ABNORMAL LOW
Sodium: 139

## 2011-03-27 LAB — CARDIAC PANEL(CRET KIN+CKTOT+MB+TROPI)
Relative Index: 1.6
Troponin I: 0.22 — ABNORMAL HIGH

## 2011-04-18 NOTE — Telephone Encounter (Signed)
Completed.

## 2011-04-19 ENCOUNTER — Encounter: Payer: Self-pay | Admitting: Internal Medicine

## 2011-04-19 ENCOUNTER — Ambulatory Visit (INDEPENDENT_AMBULATORY_CARE_PROVIDER_SITE_OTHER): Payer: Medicare Other | Admitting: Internal Medicine

## 2011-04-19 DIAGNOSIS — I10 Essential (primary) hypertension: Secondary | ICD-10-CM

## 2011-04-19 DIAGNOSIS — E119 Type 2 diabetes mellitus without complications: Secondary | ICD-10-CM

## 2011-04-19 NOTE — Patient Instructions (Signed)
Please check your hemoglobin A1c every 3 months   

## 2011-04-19 NOTE — Progress Notes (Signed)
  Subjective:    Patient ID: [name] [name], male    DOB: 1929/08/05, 77 y.o.   MRN: [id number]  HPI  77 year old patient who is seen today for follow up of his type 2 diabetes. He has treated hypertension and coronary artery disease. He is followed closely by urology and presently is on hormone ablation therapy due to prostate cancer. He continues to have pain involving primarily his left foot but this seems to be improved. He is on a Duragesic patch but usually takes additional analgesics at night only. Pain not clearly has moderated. His diabetes remains under excellent control. No hypoglycemic symptoms    Review of Systems  Constitutional: Positive for fatigue. Negative for fever, chills and appetite change.  HENT: Negative for hearing loss, ear pain, congestion, sore throat, trouble swallowing, neck stiffness, dental problem, voice change and tinnitus.   Eyes: Negative for pain, discharge and visual disturbance.  Respiratory: Negative for cough, chest tightness, wheezing and stridor.   Cardiovascular: Negative for chest pain, palpitations and leg swelling.  Gastrointestinal: Negative for nausea, vomiting, abdominal pain, diarrhea, constipation, blood in stool and abdominal distention.  Genitourinary: Negative for urgency, hematuria, flank pain, discharge, difficulty urinating and genital sores.  Musculoskeletal: Negative for myalgias, back pain, joint swelling, arthralgias and gait problem.  Skin: Negative for rash.  Neurological: Positive for weakness. Negative for dizziness, syncope, speech difficulty, numbness and headaches.       Complains of left upper leg atrophy and bilateral foot pain left greater than the right  Hematological: Negative for adenopathy. Does not bruise/bleed easily.  Psychiatric/Behavioral: Negative for behavioral problems and dysphoric mood. The patient is not nervous/anxious.        Objective:   Physical Exam  Constitutional: He appears well-developed and  well-nourished. No distress.       Blood pressure 126/70  Cardiovascular: Normal rate, regular rhythm and normal heart sounds.   Pulmonary/Chest: Effort normal and breath sounds normal.  Abdominal: Soft. Bowel sounds are normal.  Neurological: He displays abnormal reflex.       Absent left Achilles reflex          Assessment & Plan:   Diabetes mellitus well controlled Hypertension stable Coronary artery disease stable  Neurology and urology followup encouraged. Will recheck here in 3 months or as needed

## 2011-04-25 ENCOUNTER — Ambulatory Visit: Payer: Medicare Other | Admitting: Internal Medicine

## 2011-04-29 ENCOUNTER — Encounter: Payer: Self-pay | Admitting: Cardiology

## 2011-04-29 ENCOUNTER — Other Ambulatory Visit: Payer: Self-pay | Admitting: Internal Medicine

## 2011-04-29 ENCOUNTER — Ambulatory Visit (INDEPENDENT_AMBULATORY_CARE_PROVIDER_SITE_OTHER): Payer: Medicare Other | Admitting: Cardiology

## 2011-04-29 VITALS — BP 128/68 | Ht 67.0 in | Wt 171.0 lb

## 2011-04-29 DIAGNOSIS — E785 Hyperlipidemia, unspecified: Secondary | ICD-10-CM

## 2011-04-29 DIAGNOSIS — I10 Essential (primary) hypertension: Secondary | ICD-10-CM

## 2011-04-29 DIAGNOSIS — I2510 Atherosclerotic heart disease of native coronary artery without angina pectoris: Secondary | ICD-10-CM

## 2011-04-29 NOTE — Assessment & Plan Note (Signed)
The patient has no new sypmtoms.  No further cardiovascular testing is indicated.  We will continue with aggressive risk reduction and meds as listed.   (Greater than 40 minutes reviewing all data with greater than 50% face to face with the patient).   

## 2011-04-29 NOTE — Progress Notes (Signed)
HPI The patient presents for follow up of CAD.  He was previously seen by Dr. Deborah Chalk.  Since he was last seen in this office he has had no acute cardiac problems.  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  He also tells me that he has recurrent prostate CA.  He describes pains in his rectum and down his left leg.  He reports left leg "shrinking".  He describes multiple visits with various MDs.  I have the report for Rogelia Boga, MD and his GI doctor.  I looked through previous records with from Dr. Deborah Chalk and from the hospital records.  With all of this he has not had any new cardiac complaints.  The patient denies any new symptoms such as chest discomfort, neck or arm discomfort. There has been no new shortness of breath, PND or orthopnea. There have been no reported palpitations, presyncope or syncope.    Of note, he was quite tearful in the office today as he talked about his wife who has had a stroke and is in the nursing home.  He also has some memory problems which has been a chronic problem.    Allergies  Allergen Reactions  . Codeine Phosphate     REACTION: unspecified    Current Outpatient Prescriptions  Medication Sig Dispense Refill  . amLODipine-valsartan (EXFORGE) 10-320 MG per tablet Take 1 tablet by mouth daily.        . Ascorbic Acid (VITAMIN C) 1000 MG tablet Take 1,000 mg by mouth daily.        [name] aspirin 81 MG tablet Take 81 mg by mouth daily.        [name] atorvastatin (LIPITOR) 10 MG tablet Take 1 tablet (10 mg total) by mouth daily.  90 tablet  6  . chlorthalidone (HYGROTON) 25 MG tablet       . Cholecalciferol (VITAMIN D) 1000 UNITS capsule Take 1,000 Units by mouth daily.        [name] docusate sodium (COLACE) 250 MG capsule Take 250 mg by mouth daily.        [name] donepezil (ARICEPT) 10 MG tablet Take 1 tablet (10 mg total) by mouth daily. And prn  90 tablet  6  . fentaNYL (DURAGESIC - DOSED MCG/HR) 50 MCG/HR Place 1 patch  onto the skin every 3 (three) days.        . fish oil-omega-3 fatty acids 1000 MG capsule Take by mouth daily.        [name] glimepiride (AMARYL) 4 MG tablet 1/2 tab qAM  90 tablet  6  . levothyroxine (SYNTHROID, LEVOTHROID) 50 MCG tablet Take 1 tablet (50 mcg total) by mouth daily.  90 tablet  0  . metFORMIN (GLUCOPHAGE) 1000 MG tablet TAKE 1 TABLET BY MOUTH TWICE DAILY  180 tablet  3  . Multiple Vitamin (MULTIVITAMIN) tablet Take 1 tablet by mouth daily.        . naproxen sodium (ANAPROX) 220 MG tablet Take 220 mg by mouth 2 (two) times daily with meals.        . nitroGLYCERIN (NITROSTAT) 0.4 MG SL tablet Place 1 tablet (0.4 mg total) under the tongue every 5 (five) minutes as needed.  25 tablet  4  . pregabalin (LYRICA) 50 MG capsule Take 50 mg by mouth 3 (three) times daily.       . pseudoephedrine-codeine-guaifenesin (MYTUSSIN DAC) 30-10-100 MG/5ML solution Take 10 mLs by mouth 4 (four)  times daily as needed.        . ibandronate (BONIVA) 150 MG tablet Take 150 mg by mouth every 30 (thirty) days. Take in the morning with a full glass of water, on an empty stomach, and do not take anything else by mouth or lie down for the next 30 min.         Past Medical History  Diagnosis Date  . Hypertension   . Diabetes mellitus   . Hyperlipidemia   . Fatigue   . Memory loss     mild  . Rectal ulcer   . IHD (ischemic heart disease)     S/P CABG x 5 1998; S/P stent LCX 2009  . Constipation   . Bruises easily   . History of ventricular fibrillation 1998    while on treadmill  . Hypothyroidism   . Prostate cancer   . IBS (irritable bowel syndrome)   . Colitis   . ED (erectile dysfunction)   . OA (osteoarthritis)     Past Surgical History  Procedure Date  . Coronary artery bypass graft 1998    x5, with a left internal mammary to the LAD and diagonal, vein graft to the OM1 and 2, and a vein graft to the right coronary artery  . Coronary stent placement 11/13/2007    LCX  . Cardiovascular  stress test 2010    ROS:  As stated in the HPI and negative for all other systems.  PHYSICAL EXAM BP 128/68  Ht 5\' 7"  (1.702 m)  Wt 171 lb (77.565 kg)  BMI 26.78 kg/m2 GENERAL:  Well appearing HEENT:  Pupils equal round and reactive, fundi not visualized, oral mucosa unremarkable NECK:  No jugular venous distention, waveform within normal limits, carotid upstroke brisk and symmetric, no bruits, no thyromegaly LYMPHATICS:  No cervical, inguinal adenopathy LUNGS:  Clear to auscultation bilaterally BACK:  No CVA tenderness CHEST:  Unremarkable HEART:  PMI not displaced or sustained,S1 and S2 within normal limits, no S3, no S4, no clicks, no rubs, no murmurs ABD:  Flat, positive bowel sounds normal in frequency in pitch, no bruits, no rebound, no guarding, no midline pulsatile mass, no hepatomegaly, no splenomegaly EXT:  2 plus pulses throughout, no edema, no cyanosis no clubbing SKIN:  No rashes no nodules NEURO:  Cranial nerves II through XII grossly intact, motor grossly intact throughout [facility]:  Cognitively intact, oriented to person place and time, tearful  EKG:  Sinus bradycardia, rate 54, right bundle branch block, no acute ST-T wave changes, no change from previous 04/29/2011   ASSESSMENT AND PLAN

## 2011-04-29 NOTE — Assessment & Plan Note (Signed)
Lab Results  Component Value Date   CHOL 109 11/20/2010   HDL 34.20* 11/20/2010   LDLCALC 57 11/20/2010   TRIG 90.0 11/20/2010   CHOLHDL 3 11/20/2010   He will continue on the meds as listed.

## 2011-04-29 NOTE — Patient Instructions (Signed)
Follow up in 6 months with Dr Hochrein.  You will receive a letter in the mail 2 months before you are due.  Please call us when you receive this letter to schedule your follow up appointment.   The current medical regimen is effective;  continue present plan and medications.  

## 2011-04-29 NOTE — Assessment & Plan Note (Signed)
The blood pressure is at target. No change in medications is indicated. We will continue with therapeutic lifestyle changes (TLC).  

## 2011-05-01 ENCOUNTER — Other Ambulatory Visit: Payer: Self-pay

## 2011-05-01 MED ORDER — FENTANYL 50 MCG/HR TD PT72: 1.0000 | MEDICATED_PATCH | TRANSDERMAL | Status: DC

## 2011-05-01 NOTE — Telephone Encounter (Signed)
Called and left msg r/t fentyl patch refills avilb for pick up - dr. Amador Cunas out of office and will address [facility] refill next week.

## 2011-05-15 ENCOUNTER — Other Ambulatory Visit: Payer: Self-pay

## 2011-05-21 ENCOUNTER — Ambulatory Visit: Payer: Medicare Other | Admitting: Cardiology

## 2011-06-01 ENCOUNTER — Other Ambulatory Visit: Payer: Self-pay | Admitting: Internal Medicine

## 2011-06-03 ENCOUNTER — Ambulatory Visit (INDEPENDENT_AMBULATORY_CARE_PROVIDER_SITE_OTHER): Payer: Medicare Other | Admitting: Internal Medicine

## 2011-06-03 ENCOUNTER — Encounter: Payer: Self-pay | Admitting: Internal Medicine

## 2011-06-03 DIAGNOSIS — C61 Malignant neoplasm of prostate: Secondary | ICD-10-CM

## 2011-06-03 DIAGNOSIS — E119 Type 2 diabetes mellitus without complications: Secondary | ICD-10-CM

## 2011-06-03 DIAGNOSIS — I10 Essential (primary) hypertension: Secondary | ICD-10-CM

## 2011-06-03 MED ORDER — TAPENTADOL HCL 50 MG PO TABS: 50.0000 mg | ORAL_TABLET | Freq: Four times a day (QID) | ORAL | Status: DC | PRN

## 2011-06-03 MED ORDER — FENTANYL 50 MCG/HR TD PT72: 1.0000 | MEDICATED_PATCH | TRANSDERMAL | Status: DC

## 2011-06-03 NOTE — Patient Instructions (Signed)
It is important that you exercise regularly, at least 20 minutes 3 to 4 times per week.  If you develop chest pain or shortness of breath seek  medical attention.   Please check your hemoglobin A1c every 3 months  Call if blood sugars are consistently in excess of 200

## 2011-06-03 NOTE — Progress Notes (Signed)
  Subjective:    Patient ID: [name] [name], male    DOB: [birth year], 77 y.o.   MRN: [id number]  HPI  77 year old patient who has a history of metastatic prostate cancer. He is followed closely by urology and has improved with hormonal androgen ablation therapy. He continues to have some left leg pain and some paresthesias but this has greatly improved. He has been on narcotics do to the severity of the pain in the past. He continues to have pain the still more marked in the evening and does much better throughout the day. His last hemoglobin A1c was 6.3. For the past several days blood sugars have been elevated often in excess of 200. He remains on metformin as well as Amaryl. He has  considerable situational stress due to the poor health of his wife    Review of Systems  Musculoskeletal: Positive for gait problem.  Neurological: Positive for numbness (dysesthesia left leg).  Psychiatric/Behavioral: Positive for confusion. The patient is nervous/anxious.        Objective:   Physical Exam  Constitutional: He appears well-developed and well-nourished.       Quite anxious and at times tearful. Normal blood pressure  Cardiovascular: Normal rate and regular rhythm.   Pulmonary/Chest: Effort normal and breath sounds normal. No respiratory distress.  Neurological:       Absent left Achilles reflex          Assessment & Plan:   Metastatic prostate cancer. Radicular pain improved. We'll decrease his narcotic analgesia. He is scheduled for followup in 4 weeks we'll check a hemoglobin A1c at that time Diabetes mellitus. He has been asked to track blood sugars more regularly and to call if he is consistently above 200. He is scheduled for followup in approximately one month

## 2011-07-12 ENCOUNTER — Encounter: Payer: Self-pay | Admitting: Internal Medicine

## 2011-07-26 ENCOUNTER — Ambulatory Visit: Payer: Medicare Other | Admitting: Internal Medicine

## 2011-07-29 ENCOUNTER — Telehealth: Payer: Self-pay | Admitting: Internal Medicine

## 2011-07-29 NOTE — Telephone Encounter (Signed)
Pt is coming in for ov on 07/30/11 to see Dr Amador Cunas for fup ov. Pt is req to be able to pick up script for fentaNYL (DURAGESIC - DOSED MCG/HR) 50 MCG/HR patch, when pt is in for ov tomorrow.

## 2011-07-30 ENCOUNTER — Encounter: Payer: Self-pay | Admitting: Internal Medicine

## 2011-07-30 ENCOUNTER — Ambulatory Visit (INDEPENDENT_AMBULATORY_CARE_PROVIDER_SITE_OTHER): Payer: Medicare Other | Admitting: Internal Medicine

## 2011-07-30 DIAGNOSIS — E119 Type 2 diabetes mellitus without complications: Secondary | ICD-10-CM

## 2011-07-30 DIAGNOSIS — E1149 Type 2 diabetes mellitus with other diabetic neurological complication: Secondary | ICD-10-CM

## 2011-07-30 DIAGNOSIS — C61 Malignant neoplasm of prostate: Secondary | ICD-10-CM

## 2011-07-30 DIAGNOSIS — I10 Essential (primary) hypertension: Secondary | ICD-10-CM

## 2011-07-30 MED ORDER — TAPENTADOL HCL 50 MG PO TABS: 50.0000 mg | ORAL_TABLET | Freq: Four times a day (QID) | ORAL | Status: DC | PRN

## 2011-07-30 MED ORDER — FENTANYL 25 MCG/HR TD PT72: 1.0000 | MEDICATED_PATCH | TRANSDERMAL | Status: DC

## 2011-07-30 NOTE — Telephone Encounter (Signed)
Noted  

## 2011-07-30 NOTE — Patient Instructions (Signed)
Call or return to clinic prn if these symptoms worsen or fail to improve as anticipated.   Please check your hemoglobin A1c every 3 months   

## 2011-07-30 NOTE — Progress Notes (Signed)
  Subjective:    Patient ID: [name] [name], male    DOB: 1929/11/25, 78 y.o.   MRN: [id number]  HPI  78 year old patient who is followed closely by urology and neurology. He has locally metastatic prostate cancer and continues to have the lower extremity pain. Today he complains of bilateral discomfort that has been worsened over the past 2 months. He has been out of his fentanyl patch. He feels his diabetes has not been as well controlled of late. His last hemoglobin A1c was 6.2. He has coronary artery disease hypertension mild cognitive impairment all of which have been stable.    Review of Systems  Constitutional: Negative for fever, chills, appetite change and fatigue.  HENT: Negative for hearing loss, ear pain, congestion, sore throat, trouble swallowing, neck stiffness, dental problem, voice change and tinnitus.   Eyes: Negative for pain, discharge and visual disturbance.  Respiratory: Negative for cough, chest tightness, wheezing and stridor.   Cardiovascular: Negative for chest pain, palpitations and leg swelling.  Gastrointestinal: Negative for nausea, vomiting, abdominal pain, diarrhea, constipation, blood in stool and abdominal distention.  Genitourinary: Negative for urgency, hematuria, flank pain, discharge, difficulty urinating and genital sores.  Musculoskeletal: Positive for back pain. Negative for myalgias, joint swelling, arthralgias and gait problem.  Skin: Negative for rash.  Neurological: Negative for dizziness, syncope, speech difficulty, weakness, numbness (severe burning and sharp pain in the lower extremities left greater than the right) and headaches.  Hematological: Negative for adenopathy. Does not bruise/bleed easily.  Psychiatric/Behavioral: Negative for behavioral problems and dysphoric mood. The patient is not nervous/anxious.        Objective:   Physical Exam  Constitutional: He is oriented to person, place, and time. He appears well-developed and  well-nourished. No distress.       Alert but uncomfortable  HENT:  Head: Normocephalic.  Right Ear: External ear normal.  Left Ear: External ear normal.  Eyes: Conjunctivae and EOM are normal.  Neck: Normal range of motion.  Cardiovascular: Normal rate and normal heart sounds.   Pulmonary/Chest: Breath sounds normal.  Abdominal: Bowel sounds are normal.  Musculoskeletal: Normal range of motion. He exhibits no edema and no tenderness.  Neurological: He is alert and oriented to person, place, and time.       Both patellar and Achilles reflexes are now depressed  Psychiatric: He has a normal mood and affect. His behavior is normal.          Assessment & Plan:   Metastatic prostate cancer. Followup neurology and urology. Analgesics refilled Diabetes mellitus. We'll check a hemoglobin A1c Dyslipidemia Hypertension Coronary artery disease

## 2011-08-03 ENCOUNTER — Other Ambulatory Visit: Payer: Self-pay | Admitting: Internal Medicine

## 2011-08-10 ENCOUNTER — Other Ambulatory Visit: Payer: Self-pay | Admitting: Internal Medicine

## 2011-08-27 ENCOUNTER — Other Ambulatory Visit: Payer: Self-pay | Admitting: Internal Medicine

## 2011-08-27 MED ORDER — FENTANYL 25 MCG/HR TD PT72: 1.0000 | MEDICATED_PATCH | TRANSDERMAL | Status: DC

## 2011-08-27 NOTE — Telephone Encounter (Signed)
Pt aware rx ready for pick up. 

## 2011-08-27 NOTE — Telephone Encounter (Signed)
Pt need new rx fentanyl patches 50 mcg not 24 mcg

## 2011-08-30 ENCOUNTER — Encounter (HOSPITAL_COMMUNITY): Payer: Self-pay | Admitting: *Deleted

## 2011-08-30 ENCOUNTER — Telehealth: Payer: Self-pay | Admitting: Internal Medicine

## 2011-08-30 ENCOUNTER — Other Ambulatory Visit: Payer: Self-pay

## 2011-08-30 ENCOUNTER — Emergency Department (HOSPITAL_COMMUNITY): Payer: Medicare Other

## 2011-08-30 ENCOUNTER — Observation Stay (HOSPITAL_COMMUNITY)
Admission: EM | Admit: 2011-08-30 | Discharge: 2011-09-01 | Disposition: A | Discharge status: Home / Self Care | Payer: Medicare Other | Attending: Internal Medicine | Admitting: Internal Medicine

## 2011-08-30 DIAGNOSIS — R0602 Shortness of breath: Secondary | ICD-10-CM | POA: Insufficient documentation

## 2011-08-30 DIAGNOSIS — E119 Type 2 diabetes mellitus without complications: Secondary | ICD-10-CM | POA: Insufficient documentation

## 2011-08-30 DIAGNOSIS — R51 Headache: Secondary | ICD-10-CM | POA: Insufficient documentation

## 2011-08-30 DIAGNOSIS — E0842 Diabetes mellitus due to underlying condition with diabetic polyneuropathy: Secondary | ICD-10-CM | POA: Diagnosis present

## 2011-08-30 DIAGNOSIS — C61 Malignant neoplasm of prostate: Secondary | ICD-10-CM | POA: Insufficient documentation

## 2011-08-30 DIAGNOSIS — C7952 Secondary malignant neoplasm of bone marrow: Secondary | ICD-10-CM | POA: Insufficient documentation

## 2011-08-30 DIAGNOSIS — R1319 Other dysphagia: Secondary | ICD-10-CM | POA: Insufficient documentation

## 2011-08-30 DIAGNOSIS — I2510 Atherosclerotic heart disease of native coronary artery without angina pectoris: Secondary | ICD-10-CM | POA: Insufficient documentation

## 2011-08-30 DIAGNOSIS — I10 Essential (primary) hypertension: Secondary | ICD-10-CM | POA: Diagnosis present

## 2011-08-30 DIAGNOSIS — C7951 Secondary malignant neoplasm of bone: Secondary | ICD-10-CM | POA: Insufficient documentation

## 2011-08-30 DIAGNOSIS — E039 Hypothyroidism, unspecified: Secondary | ICD-10-CM | POA: Diagnosis present

## 2011-08-30 DIAGNOSIS — R079 Chest pain, unspecified: Principal | ICD-10-CM | POA: Diagnosis present

## 2011-08-30 HISTORY — DX: Atherosclerotic heart disease of native coronary artery without angina pectoris: I25.10

## 2011-08-30 LAB — CBC
HCT: 30.5 % — ABNORMAL LOW (ref 39.0–52.0)
Hemoglobin: 10.6 g/dL — ABNORMAL LOW (ref 13.0–17.0)
MCH: 31.3 pg (ref 26.0–34.0)
MCHC: 34.8 g/dL (ref 30.0–36.0)
MCV: 90 fL (ref 78.0–100.0)
Platelets: 146 K/uL — ABNORMAL LOW (ref 150–400)
RBC: 3.39 MIL/uL — ABNORMAL LOW (ref 4.22–5.81)
RDW: 13.2 % (ref 11.5–15.5)
WBC: 4.1 K/uL (ref 4.0–10.5)

## 2011-08-30 LAB — POCT I-STAT TROPONIN I: Troponin i, poc: 0 ng/mL (ref 0.00–0.08)

## 2011-08-30 LAB — BASIC METABOLIC PANEL
BUN: 28 mg/dL — ABNORMAL HIGH (ref 6–23)
Calcium: 9.6 mg/dL (ref 8.4–10.5)
Creatinine, Ser: 1.42 mg/dL — ABNORMAL HIGH (ref 0.50–1.35)
GFR calc Af Amer: 53 mL/min — ABNORMAL LOW (ref >90)
GFR calc non Af Amer: 45 mL/min — ABNORMAL LOW (ref >90)

## 2011-08-30 LAB — TROPONIN I: Troponin I: <0.3 ng/mL (ref <0.30)

## 2011-08-30 MED ORDER — ASPIRIN 81 MG PO CHEW
324.0000 mg | CHEWABLE_TABLET | Freq: Once | ORAL | Status: AC
  Administered 2011-08-30: 324 mg via ORAL

## 2011-08-30 NOTE — ED Notes (Signed)
Pt states that he was at work when he felt a rush of blood to his head. Pt reports he feels sob. Pt is negative for stroke. Denies chest pain. States he feels a heaviness in his head.

## 2011-08-30 NOTE — Telephone Encounter (Signed)
Pts daughter called and said that her father is in ER for Chest Pains, SOB and Headache. Pt is wearing his last fentaNYL (DURAGESIC - DOSED MCG/HR) 25 MCG, is needing a refill. Pt is wondering if script call be sent to [facility]? Pls call.

## 2011-08-30 NOTE — ED Provider Notes (Signed)
History     CSN: [id number]  Arrival date & time 08/30/11  1545   First MD Initiated Contact with Patient 08/30/11 1911      Chief Complaint  Patient presents with  . Shortness of Breath  . Headache    (Consider location/radiation/quality/duration/timing/severity/associated sxs/prior treatment) HPI Patient is 78yo white male with history of prostate cancer with bone metastasis, DM, HTN, CABG, who presents to the ED after feelings of tightness and pressure in his chest, neck and head this afternoon. He said he was about to leave work about 3pm when he started feeling this pressure, which he had never felt before, and asked one of his associates to bring him to the ED. He denies any kind of pain in his head, neck or chest. He denies recent fevers, chills, sweats, cough, congestion. He denies N/V/D, changes in vision, tinnitus, lightheadedness, dizziness. Patient states that currently he feels about 50% better than when he got to the ED. He states that he currently is having trouble swallowing, but the pressure in his head is decreased and he still feels mild pressure in his chest. Patient wears a fentanyl patch for pain from the prostate cancer and gets Lupron and xgevia shots for the prostate cancer once a month. See med list for other medications. He is allergic to codeine. Past Medical History  Diagnosis Date  . Hypertension   . Diabetes mellitus   . Hyperlipidemia   . Fatigue   . Memory loss     mild  . Rectal ulcer   . IHD (ischemic heart disease)     S/P CABG x 5 1998; S/P stent LCX 2009  . Constipation   . Bruises easily   . History of ventricular fibrillation 1998    while on treadmill  . Hypothyroidism   . Prostate cancer   . IBS (irritable bowel syndrome)   . Colitis   . ED (erectile dysfunction)   . OA (osteoarthritis)     Past Surgical History  Procedure Date  . Coronary artery bypass graft 1998    x5, with a left internal mammary to the LAD and diagonal, vein  graft to the OM1 and 2, and a vein graft to the right coronary artery  . Coronary stent placement 11/13/2007    LCX  . Cardiovascular stress test 2010    Family History  Problem Relation Age of Onset  . Stroke Father   . Pneumonia Father   . Colon cancer Neg Hx     History  Substance Use Topics  . Smoking status: Never Smoker   . Smokeless tobacco: Never Used  . Alcohol Use: No      Review of Systems All pertinent positives and negatives reviewed in the history of present illness  Allergies  Codeine phosphate  Home Medications   Current Outpatient Rx  Name Route Sig Dispense Refill  . AMLODIPINE BESYLATE-VALSARTAN 10-320 MG PO TABS Oral Take 1 tablet by mouth daily.    . ASPIRIN 81 MG PO TABS Oral Take 81 mg by mouth daily.      . ATORVASTATIN CALCIUM 10 MG PO TABS Oral Take 10 mg by mouth daily.    . CHLORTHALIDONE 25 MG PO TABS Oral Take 12.5 mg by mouth daily.    [name] VITAMIN D 1000 UNITS PO CAPS Oral Take 1,000 Units by mouth daily.      Rivka Barbara [place] Subcutaneous Inject into the skin.    [name] DOCUSATE SODIUM 250 MG PO CAPS Oral Take  250 mg by mouth daily.      . DONEPEZIL HCL 10 MG PO TABS Oral Take 10 mg by mouth daily.    . FENTANYL 25 MCG/HR TD PT72 Transdermal Place 1 patch onto the skin every 3 (three) days.    . OMEGA-3 FATTY ACIDS 1000 MG PO CAPS Oral Take 1 g by mouth daily.     [name] GLIMEPIRIDE 4 MG PO TABS Oral Take 2 mg by mouth daily before breakfast.    . LEVOTHYROXINE SODIUM 50 MCG PO TABS Oral Take 50 mcg by mouth daily.    [name] METFORMIN HCL 500 MG PO TABS Oral Take 500 mg by mouth 2 (two) times daily with a meal. Takes 1000 mg in the Am and 500 mg in the pm    . ONE-DAILY MULTI VITAMINS PO TABS Oral Take 1 tablet by mouth daily.      [name] NITROGLYCERIN 0.4 MG SL SUBL Sublingual Place 0.4 mg under the tongue every 5 (five) minutes as needed. For chest pain    . PREGABALIN 50 MG PO CAPS Oral Take 50 mg by mouth 2 (two) times daily.     [name] TAPENTADOL HCL 50 MG PO TABS  Oral Take 50 mg by mouth every 6 (six) hours as needed. For pain    . TAPENTADOL HCL 50 MG PO TABS Oral Take 100 mg by mouth every 6 (six) hours as needed. For pain      BP 127/57  Pulse 48  Temp(Src) 97.5 F (36.4 C) (Oral)  Resp 9  SpO2 98%  Physical Exam  Constitutional: He is oriented to person, place, and time. He appears well-developed and well-nourished. No distress.  HENT:  Head: Normocephalic and atraumatic. No trismus in the jaw.  Mouth/Throat: Oropharynx is clear and moist. No uvula swelling. No oropharyngeal exudate, posterior oropharyngeal edema, posterior oropharyngeal erythema or tonsillar abscesses.  Eyes: EOM are normal. Pupils are equal, round, and reactive to light.  Neck: Normal range of motion. Neck supple. No JVD present. No tracheal deviation present. No thyromegaly present.       Difficulty swallowing, but denies pain or tenderness to palpation. No obvious deformities.   Cardiovascular: Normal rate, regular rhythm and normal heart sounds.   Pulmonary/Chest: Effort normal and breath sounds normal. No stridor. No respiratory distress. He has no wheezes.  Abdominal: Soft. Bowel sounds are normal. He exhibits no distension. There is no tenderness.  Lymphadenopathy:    He has no cervical adenopathy.  Neurological: He is alert and oriented to person, place, and time.  Skin: Skin is warm and dry. He is not diaphoretic.    ED Course  Procedures (including critical care time)  Labs Reviewed  BASIC METABOLIC PANEL - Abnormal; Notable for the following:    Glucose, Bld 274 (*)    BUN 28 (*)    Creatinine, Ser 1.42 (*)    GFR calc non Af Amer 45 (*)    GFR calc Af Amer 53 (*)    All other components within normal limits  CBC - Abnormal; Notable for the following:    RBC 3.39 (*)    Hemoglobin 10.6 (*)    HCT 30.5 (*)    Platelets 146 (*)    All other components within normal limits  POCT I-STAT TROPONIN I  TROPONIN I   Dg Chest 2 View  08/30/2011   *RADIOLOGY REPORT*  Clinical Data: Shortness of breath and headache  CHEST - 2 VIEW  Comparison: 01/21/2011  Findings: Prior  median sternotomy and CABG procedure.  The heart size and mediastinal contours are within normal limits.  Both lungs are clear.  The visualized skeletal structures are unremarkable.  IMPRESSION: No active cardiopulmonary abnormalities.  Original Report Authenticated By: Rosealee Albee, M.D.   Ct Head Wo Contrast  08/30/2011  *RADIOLOGY REPORT*  Clinical Data: Headache, shortness of breath, history hypertension, diabetes, prostate cancer  CT HEAD WITHOUT CONTRAST  Technique:  Contiguous axial images were obtained from the base of the skull through the vertex without contrast.  Comparison: 01/21/2011  Findings: Generalized atrophy. Normal ventricular morphology. No midline shift or mass effect. Small vessel chronic ischemic changes of deep cerebral white matter. No intracranial hemorrhage, mass lesion, or acute infarction. Visualized paranasal sinuses and mastoid air cells clear. Bones unremarkable. Atherosclerotic calcification within internal carotid and vertebral arteries at skull base.  IMPRESSION: Atrophy with small vessel chronic ischemic changes of deep cerebral white matter. No acute intracranial abnormalities.  Original Report Authenticated By: Lollie Marrow, M.D.      Date: 08/31/2011  Rate: 58  Rhythm: sinus arrhythmia  QRS Axis: normal  Intervals: normal  ST/T Wave abnormalities: normal  Conduction Disutrbances:right bundle branch block  Narrative Interpretation:   Old EKG Reviewed: unchanged  The patient will be admitted for this chest discomfort and radiating symptoms.    MDM  MDM Reviewed: previous chart, nursing note and vitals Reviewed previous: labs and ECG Interpretation: labs, ECG, x-ray and CT scan Consults: admitting MD            Carlyle Dolly, PA-C 08/31/11 0136

## 2011-08-30 NOTE — ED Provider Notes (Signed)
MSE: hx metastatic prostate cancer, CABG, HTN, DM presenting with gradual onset of chest pressure and tightness that onset around 300.  This was associated with gradual onset head pressure, SOB, and difficulty swallowing.   Still having pressure in chest and head but getting better. nonfocal neuro exam.   Date: 08/30/2011  Rate: 56  Rhythm: normal sinus rhythm  QRS Axis: normal  Intervals: normal  ST/T Wave abnormalities: nonspecific ST/T changes  Conduction Disutrbances:right bundle branch block  Narrative Interpretation:   Old EKG Reviewed: unchanged    Glynn Octave, MD 08/30/11 1749

## 2011-08-30 NOTE — ED Notes (Signed)
Patient stated he is having trouble swallowing

## 2011-08-30 NOTE — Telephone Encounter (Signed)
Pls advise.  

## 2011-08-31 ENCOUNTER — Encounter (HOSPITAL_COMMUNITY): Payer: Self-pay | Admitting: Cardiology

## 2011-08-31 DIAGNOSIS — R079 Chest pain, unspecified: Secondary | ICD-10-CM | POA: Diagnosis present

## 2011-08-31 LAB — CARDIAC PANEL(CRET KIN+CKTOT+MB+TROPI)
CK, MB: 3 ng/mL (ref 0.3–4.0)
Relative Index: INVALID (ref 0.0–2.5)
Relative Index: INVALID (ref 0.0–2.5)
Total CK: 79 U/L (ref 7–232)
Troponin I: <0.3 ng/mL (ref <0.30)
Troponin I: <0.3 ng/mL (ref <0.30)

## 2011-08-31 LAB — CBC
HCT: 30.4 % — ABNORMAL LOW (ref 39.0–52.0)
HCT: 30.7 % — ABNORMAL LOW (ref 39.0–52.0)
Hemoglobin: 10.6 g/dL — ABNORMAL LOW (ref 13.0–17.0)
MCH: 30.8 pg (ref 26.0–34.0)
MCV: 89.2 fL (ref 78.0–100.0)
RBC: 3.44 MIL/uL — ABNORMAL LOW (ref 4.22–5.81)
RDW: 13.3 % (ref 11.5–15.5)
WBC: 3.6 10*3/uL — ABNORMAL LOW (ref 4.0–10.5)

## 2011-08-31 LAB — COMPREHENSIVE METABOLIC PANEL
AST: 14 U/L (ref 0–37)
Albumin: 3.3 g/dL — ABNORMAL LOW (ref 3.5–5.2)
Calcium: 9.2 mg/dL (ref 8.4–10.5)
Chloride: 103 mEq/L (ref 96–112)
Creatinine, Ser: 1.39 mg/dL — ABNORMAL HIGH (ref 0.50–1.35)
Total Bilirubin: 0.5 mg/dL (ref 0.3–1.2)
Total Protein: 5.8 g/dL — ABNORMAL LOW (ref 6.0–8.3)

## 2011-08-31 LAB — CREATININE, SERUM
Creatinine, Ser: 1.39 mg/dL — ABNORMAL HIGH (ref 0.50–1.35)
GFR calc Af Amer: 54 mL/min — ABNORMAL LOW (ref >90)
GFR calc non Af Amer: 47 mL/min — ABNORMAL LOW (ref >90)

## 2011-08-31 LAB — GLUCOSE, CAPILLARY: Glucose-Capillary: 214 mg/dL — ABNORMAL HIGH (ref 70–99)

## 2011-08-31 LAB — D-DIMER, QUANTITATIVE: D-Dimer, Quant: <0.22 ug/mL-FEU (ref 0.00–0.48)

## 2011-08-31 LAB — HEMOGLOBIN A1C: Mean Plasma Glucose: 154 mg/dL — ABNORMAL HIGH (ref <117)

## 2011-08-31 MED ORDER — CHLORTHALIDONE 25 MG PO TABS
12.5000 mg | ORAL_TABLET | Freq: Every day | ORAL | Status: DC
  Administered 2011-08-31 – 2011-09-01 (×2): 12.5 mg via ORAL
  Filled 2011-08-31 (×2): qty 0.5

## 2011-08-31 MED ORDER — DOCUSATE SODIUM 50 MG PO CAPS: 250.0000 mg | ORAL_CAPSULE | Freq: Every day | ORAL | Status: DC

## 2011-08-31 MED ORDER — SODIUM CHLORIDE 0.9 % IJ SOLN
3.0000 mL | Freq: Two times a day (BID) | INTRAMUSCULAR | Status: DC
  Administered 2011-08-31 (×2): 3 mL via INTRAVENOUS

## 2011-08-31 MED ORDER — IRBESARTAN 300 MG PO TABS
300.0000 mg | ORAL_TABLET | Freq: Every day | ORAL | Status: DC
  Administered 2011-08-31 – 2011-09-01 (×2): 300 mg via ORAL
  Filled 2011-08-31 (×2): qty 1

## 2011-08-31 MED ORDER — DOCUSATE SODIUM 50 MG PO CAPS
250.0000 mg | ORAL_CAPSULE | Freq: Every day | ORAL | Status: DC
  Administered 2011-08-31: 250 mg via ORAL
  Filled 2011-08-31 (×2): qty 1

## 2011-08-31 MED ORDER — FENTANYL 25 MCG/HR TD PT72
25.0000 ug | MEDICATED_PATCH | TRANSDERMAL | Status: DC
  Administered 2011-08-31: 25 ug via TRANSDERMAL
  Filled 2011-08-31: qty 1

## 2011-08-31 MED ORDER — IPRATROPIUM BROMIDE 0.02 % IN SOLN
0.5000 mg | Freq: Four times a day (QID) | RESPIRATORY_TRACT | Status: DC
  Administered 2011-08-31: 0.5 mg via RESPIRATORY_TRACT
  Filled 2011-08-31 (×2): qty 2.5

## 2011-08-31 MED ORDER — LEVOTHYROXINE SODIUM 50 MCG PO TABS
50.0000 ug | ORAL_TABLET | Freq: Every day | ORAL | Status: DC
  Administered 2011-08-31 – 2011-09-01 (×2): 50 ug via ORAL
  Filled 2011-08-31 (×2): qty 1

## 2011-08-31 MED ORDER — SODIUM CHLORIDE 0.9 % IV SOLN: 250.0000 mL | INTRAVENOUS | Status: DC | PRN

## 2011-08-31 MED ORDER — ACETAMINOPHEN 325 MG PO TABS
650.0000 mg | ORAL_TABLET | Freq: Four times a day (QID) | ORAL | Status: DC | PRN
  Administered 2011-08-31: 650 mg via ORAL
  Filled 2011-08-31: qty 2

## 2011-08-31 MED ORDER — ADULT MULTIVITAMIN W/MINERALS CH
1.0000 | ORAL_TABLET | Freq: Every day | ORAL | Status: DC
  Administered 2011-08-31 – 2011-09-01 (×2): 1 via ORAL
  Filled 2011-08-31 (×2): qty 1

## 2011-08-31 MED ORDER — ALBUTEROL SULFATE (5 MG/ML) 0.5% IN NEBU
2.5000 mg | INHALATION_SOLUTION | Freq: Four times a day (QID) | RESPIRATORY_TRACT | Status: DC
  Administered 2011-08-31: 2.5 mg via RESPIRATORY_TRACT
  Filled 2011-08-31 (×2): qty 0.5

## 2011-08-31 MED ORDER — ACETAMINOPHEN 650 MG RE SUPP: 650.0000 mg | Freq: Four times a day (QID) | RECTAL | Status: DC | PRN

## 2011-08-31 MED ORDER — SODIUM CHLORIDE 0.9 % IJ SOLN: 3.0000 mL | INTRAMUSCULAR | Status: DC | PRN

## 2011-08-31 MED ORDER — PANTOPRAZOLE SODIUM 40 MG PO TBEC
40.0000 mg | DELAYED_RELEASE_TABLET | Freq: Every day | ORAL | Status: DC
  Administered 2011-08-31: 40 mg via ORAL
  Filled 2011-08-31: qty 1

## 2011-08-31 MED ORDER — NITROGLYCERIN 0.4 MG SL SUBL: 0.4000 mg | SUBLINGUAL_TABLET | SUBLINGUAL | Status: DC | PRN

## 2011-08-31 MED ORDER — ATORVASTATIN CALCIUM 10 MG PO TABS
10.0000 mg | ORAL_TABLET | Freq: Every day | ORAL | Status: DC
  Administered 2011-08-31 – 2011-09-01 (×2): 10 mg via ORAL
  Filled 2011-08-31 (×2): qty 1

## 2011-08-31 MED ORDER — PREGABALIN 50 MG PO CAPS
50.0000 mg | ORAL_CAPSULE | Freq: Two times a day (BID) | ORAL | Status: DC
  Administered 2011-08-31 – 2011-09-01 (×3): 50 mg via ORAL
  Filled 2011-08-31 (×3): qty 1

## 2011-08-31 MED ORDER — FENTANYL 25 MCG/HR TD PT72: 1.0000 | MEDICATED_PATCH | TRANSDERMAL | Status: DC

## 2011-08-31 MED ORDER — OXYCODONE HCL 5 MG PO TABS: 10.0000 mg | ORAL_TABLET | Freq: Four times a day (QID) | ORAL | Status: DC | PRN

## 2011-08-31 MED ORDER — ASPIRIN 81 MG PO CHEW
81.0000 mg | CHEWABLE_TABLET | Freq: Every day | ORAL | Status: DC
Start: 2011-08-31 — End: 2011-09-01
  Administered 2011-08-31 – 2011-09-01 (×2): 81 mg via ORAL
  Filled 2011-08-31 (×2): qty 1

## 2011-08-31 MED ORDER — AMLODIPINE BESYLATE 10 MG PO TABS
10.0000 mg | ORAL_TABLET | Freq: Every day | ORAL | Status: DC
  Administered 2011-08-31 – 2011-09-01 (×2): 10 mg via ORAL
  Filled 2011-08-31 (×2): qty 1

## 2011-08-31 MED ORDER — PANTOPRAZOLE SODIUM 40 MG PO TBEC: 40.0000 mg | DELAYED_RELEASE_TABLET | Freq: Every day | ORAL | Status: DC

## 2011-08-31 MED ORDER — SODIUM CHLORIDE 0.9 % IJ SOLN: 3.0000 mL | Freq: Two times a day (BID) | INTRAMUSCULAR | Status: DC

## 2011-08-31 MED ORDER — AMLODIPINE BESYLATE-VALSARTAN 10-320 MG PO TABS: 1.0000 | ORAL_TABLET | Freq: Every day | ORAL | Status: DC

## 2011-08-31 MED ORDER — POTASSIUM CHLORIDE CRYS ER 20 MEQ PO TBCR
20.0000 meq | EXTENDED_RELEASE_TABLET | Freq: Two times a day (BID) | ORAL | Status: AC
  Administered 2011-08-31 (×2): 20 meq via ORAL
  Filled 2011-08-31 (×2): qty 1

## 2011-08-31 MED ORDER — DONEPEZIL HCL 10 MG PO TABS
10.0000 mg | ORAL_TABLET | Freq: Every day | ORAL | Status: DC
  Administered 2011-08-31 – 2011-09-01 (×2): 10 mg via ORAL
  Filled 2011-08-31 (×2): qty 1

## 2011-08-31 MED ORDER — ENOXAPARIN SODIUM 40 MG/0.4ML ~~LOC~~ SOLN
40.0000 mg | SUBCUTANEOUS | Status: DC
  Administered 2011-08-31: 40 mg via SUBCUTANEOUS
  Filled 2011-08-31 (×2): qty 0.4

## 2011-08-31 MED ORDER — GLIMEPIRIDE 2 MG PO TABS
2.0000 mg | ORAL_TABLET | Freq: Every day | ORAL | Status: DC
  Administered 2011-08-31: 2 mg via ORAL
  Filled 2011-08-31 (×3): qty 1

## 2011-08-31 MED ORDER — METFORMIN HCL 500 MG PO TABS
500.0000 mg | ORAL_TABLET | Freq: Two times a day (BID) | ORAL | Status: DC
  Filled 2011-08-31 (×3): qty 1

## 2011-08-31 NOTE — ED Notes (Signed)
Report called to [facility] RN and patient ready for move.

## 2011-08-31 NOTE — Progress Notes (Addendum)
Subjective:  currently chest pain-free  Objective: Vital signs in last 24 hours: Filed Vitals:   08/31/11 0156 08/31/11 0500 08/31/11 0847 08/31/11 1030  BP: 136/67 112/58  103/56  Pulse: 66 48    Temp: 97.9 F (36.6 C) 97.9 F (36.6 C)    TempSrc: Oral     Resp: 15 18    Height: 5\' 8"  (1.727 m)     Weight: 81.738 kg (180 lb 3.2 oz)     SpO2: 98% 98% 96%    No intake or output data in the 24 hours ending 08/31/11 1243  Weight change:  HENT:  Head: Normocephalic and atraumatic.  Right Ear: External ear normal.  Left Ear: External ear normal.  Nose: Nose normal.  Mouth/Throat: Oropharynx is clear and moist.  Eyes: Conjunctivae and EOM are normal. Pupils are equal, round, and reactive to light.  Neck: Normal range of motion. Neck supple.  Cardiovascular: Normal rate, regular rhythm, normal heart sounds and intact distal pulses.  Respiratory: Effort normal and breath sounds normal.  GI: Soft. Bowel sounds are normal.  Musculoskeletal: Normal range of motion.  Neurological: He is alert and oriented to person, place, and time. He has normal reflexes.  Skin: Skin is warm and dry.  Psychiatric: He has a normal mood and affect. His behavior is normal. Judgment and thought content norm    Lab Results: Results for orders placed during the hospital encounter of 08/30/11 (from the past 24 hour(s))  POCT I-STAT TROPONIN I     Status: Normal   Collection Time   08/30/11  6:33 PM      Component Value Range   Troponin i, poc 0.00  0.00 - 0.08 (ng/mL)   Comment 3           BASIC METABOLIC PANEL     Status: Abnormal   Collection Time   08/30/11  6:35 PM      Component Value Range   Sodium 138  135 - 145 (mEq/L)   Potassium 3.5  3.5 - 5.1 (mEq/L)   Chloride 103  96 - 112 (mEq/L)   CO2 26  19 - 32 (mEq/L)   Glucose, Bld 274 (*) 70 - 99 (mg/dL)   BUN 28 (*) 6 - 23 (mg/dL)   Creatinine, Ser 1.61 (*) 0.50 - 1.35 (mg/dL)   Calcium 9.6  8.4 - 09.6 (mg/dL)   GFR calc non Af Amer 45 (*)  >90 (mL/min)   GFR calc Af Amer 53 (*) >90 (mL/min)  CBC     Status: Abnormal   Collection Time   08/30/11  6:35 PM      Component Value Range   WBC 4.1  4.0 - 10.5 (K/uL)   RBC 3.39 (*) 4.22 - 5.81 (MIL/uL)   Hemoglobin 10.6 (*) 13.0 - 17.0 (g/dL)   HCT 04.5 (*) 40.9 - 52.0 (%)   MCV 90.0  78.0 - 100.0 (fL)   MCH 31.3  26.0 - 34.0 (pg)   MCHC 34.8  30.0 - 36.0 (g/dL)   RDW 81.1  91.4 - 78.2 (%)   Platelets 146 (*) 150 - 400 (K/uL)  TROPONIN I     Status: Normal   Collection Time   08/30/11  9:12 PM      Component Value Range   Troponin I <0.30  <0.30 (ng/mL)  CBC     Status: Abnormal   Collection Time   08/31/11  2:30 AM      Component Value Range   WBC  3.6 (*) 4.0 - 10.5 (K/uL)   RBC 3.40 (*) 4.22 - 5.81 (MIL/uL)   Hemoglobin 10.7 (*) 13.0 - 17.0 (g/dL)   HCT 95.6 (*) 21.3 - 52.0 (%)   MCV 89.4  78.0 - 100.0 (fL)   MCH 31.5  26.0 - 34.0 (pg)   MCHC 35.2  30.0 - 36.0 (g/dL)   RDW 08.6  57.8 - 46.9 (%)   Platelets 121 (*) 150 - 400 (K/uL)  CREATININE, SERUM     Status: Abnormal   Collection Time   08/31/11  2:30 AM      Component Value Range   Creatinine, Ser 1.39 (*) 0.50 - 1.35 (mg/dL)   GFR calc non Af Amer 47 (*) >90 (mL/min)   GFR calc Af Amer 54 (*) >90 (mL/min)  CARDIAC PANEL(CRET KIN+CKTOT+MB+TROPI)     Status: Normal   Collection Time   08/31/11  2:43 AM      Component Value Range   Total CK 81  7 - 232 (U/L)   CK, MB 3.0  0.3 - 4.0 (ng/mL)   Troponin I <0.30  <0.30 (ng/mL)   Relative Index RELATIVE INDEX IS INVALID  0.0 - 2.5   COMPREHENSIVE METABOLIC PANEL     Status: Abnormal   Collection Time   08/31/11  2:45 AM      Component Value Range   Sodium 139  135 - 145 (mEq/L)   Potassium 3.3 (*) 3.5 - 5.1 (mEq/L)   Chloride 103  96 - 112 (mEq/L)   CO2 26  19 - 32 (mEq/L)   Glucose, Bld 299 (*) 70 - 99 (mg/dL)   BUN 24 (*) 6 - 23 (mg/dL)   Creatinine, Ser 6.29 (*) 0.50 - 1.35 (mg/dL)   Calcium 9.2  8.4 - 52.8 (mg/dL)   Total Protein 5.8 (*) 6.0 - 8.3 (g/dL)    Albumin 3.3 (*) 3.5 - 5.2 (g/dL)   AST 14  0 - 37 (U/L)   ALT 14  0 - 53 (U/L)   Alkaline Phosphatase 43  39 - 117 (U/L)   Total Bilirubin 0.5  0.3 - 1.2 (mg/dL)   GFR calc non Af Amer 47 (*) >90 (mL/min)   GFR calc Af Amer 54 (*) >90 (mL/min)  CBC     Status: Abnormal   Collection Time   08/31/11  2:45 AM      Component Value Range   WBC 3.8 (*) 4.0 - 10.5 (K/uL)   RBC 3.44 (*) 4.22 - 5.81 (MIL/uL)   Hemoglobin 10.6 (*) 13.0 - 17.0 (g/dL)   HCT 41.3 (*) 24.4 - 52.0 (%)   MCV 89.2  78.0 - 100.0 (fL)   MCH 30.8  26.0 - 34.0 (pg)   MCHC 34.5  30.0 - 36.0 (g/dL)   RDW 01.0  27.2 - 53.6 (%)   Platelets 126 (*) 150 - 400 (K/uL)  GLUCOSE, CAPILLARY     Status: Abnormal   Collection Time   08/31/11  8:04 AM      Component Value Range   Glucose-Capillary 214 (*) 70 - 99 (mg/dL)  CARDIAC PANEL(CRET KIN+CKTOT+MB+TROPI)     Status: Normal   Collection Time   08/31/11  9:35 AM      Component Value Range   Total CK 79  7 - 232 (U/L)   CK, MB 3.0  0.3 - 4.0 (ng/mL)   Troponin I <0.30  <0.30 (ng/mL)   Relative Index RELATIVE INDEX IS INVALID  0.0 - 2.5   D-DIMER,  QUANTITATIVE     Status: Normal   Collection Time   08/31/11  9:35 AM      Component Value Range   D-Dimer, Quant <0.22  0.00 - 0.48 (ug/mL-FEU)     Micro: No results found for this or any previous visit (from the past 240 hour(s)).  Studies/Results: Dg Chest 2 View  08/30/2011  *RADIOLOGY REPORT*  Clinical Data: Shortness of breath and headache  CHEST - 2 VIEW  Comparison: 01/21/2011  Findings: Prior median sternotomy and CABG procedure.  The heart size and mediastinal contours are within normal limits.  Both lungs are clear.  The visualized skeletal structures are unremarkable.  IMPRESSION: No active cardiopulmonary abnormalities.  Original Report Authenticated By: Rosealee Albee, M.D.   Ct Head Wo Contrast  08/30/2011  *RADIOLOGY REPORT*  Clinical Data: Headache, shortness of breath, history hypertension, diabetes, prostate  cancer  CT HEAD WITHOUT CONTRAST  Technique:  Contiguous axial images were obtained from the base of the skull through the vertex without contrast.  Comparison: 01/21/2011  Findings: Generalized atrophy. Normal ventricular morphology. No midline shift or mass effect. Small vessel chronic ischemic changes of deep cerebral white matter. No intracranial hemorrhage, mass lesion, or acute infarction. Visualized paranasal sinuses and mastoid air cells clear. Bones unremarkable. Atherosclerotic calcification within internal carotid and vertebral arteries at skull base.  IMPRESSION: Atrophy with small vessel chronic ischemic changes of deep cerebral white matter. No acute intracranial abnormalities.  Original Report Authenticated By: Lollie Marrow, M.D.    Medications:  Scheduled Meds:   . albuterol  2.5 mg Nebulization Q6H  . amLODipine  10 mg Oral Daily  . aspirin  324 mg Oral Once  . aspirin  81 mg Oral Daily  . atorvastatin  10 mg Oral Daily  . chlorthalidone  12.5 mg Oral Daily  . docusate sodium  250 mg Oral QHS  . donepezil  10 mg Oral Daily  . enoxaparin  40 mg Subcutaneous Q24H  . fentaNYL  25 mcg Transdermal Q72H  . glimepiride  2 mg Oral QAC breakfast  . ipratropium  0.5 mg Nebulization Q6H  . irbesartan  300 mg Oral Daily  . levothyroxine  50 mcg Oral Daily  . mulitivitamin with minerals  1 tablet Oral Daily  . pantoprazole  40 mg Oral Q1200  . potassium chloride  20 mEq Oral BID  . pregabalin  50 mg Oral BID  . sodium chloride  3 mL Intravenous Q12H  . DISCONTD: amLODipine-valsartan  1 tablet Oral Daily  . DISCONTD: docusate sodium  250 mg Oral Daily  . DISCONTD: metFORMIN  500 mg Oral BID WC  . DISCONTD: sodium chloride  3 mL Intravenous Q12H   Continuous Infusions:  PRN Meds:.sodium chloride, acetaminophen, acetaminophen, nitroGLYCERIN, oxyCODONE, sodium chloride   Assessment:   #1-chest pain-symptoms with both typical and apical. Enzymes negative. No acute ST changes.  Plan Myoview for risk stratification.  #2-coronary artery disease-continue aspirin and statin.  #3-chronic renal insufficiency-management per primary care.  #4-prostate cancer  #5-hypertension-continue present blood pressure medications.  #6-hyperlipidemia-continue statin.        LOS: 1 day   [facility] 08/31/2011, 12:43 PM

## 2011-08-31 NOTE — Consult Note (Signed)
HPI: 78 year old male with past medical history of coronary artery disease for evaluation of chest pain. Patient typically does not have dyspnea on exertion, orthopnea, PND, pedal edema, palpitations, syncope or exertional chest pain. Yesterday at approximately 3 PM he developed diffuse chest tightness. The pain was not pleuritic, positional or related to food. It lasted approximately 5 hours and resolves spontaneously. There was no nausea or diaphoresis there was shortness of breath. He also had tightness in his head. His enzymes have been negative and his electrocardiogram showed no ST changes. Cardiology asked to evaluate.  Medications Prior to Admission  Medication Dose Route Frequency Provider Last Rate Last Dose  . 0.9 %  sodium chloride infusion  250 mL Intravenous PRN Lonia Blood, MD      . acetaminophen (TYLENOL) tablet 650 mg  650 mg Oral Q6H PRN Lonia Blood, MD       Or  . acetaminophen (TYLENOL) suppository 650 mg  650 mg Rectal Q6H PRN Lonia Blood, MD      . albuterol (PROVENTIL) (5 MG/ML) 0.5% nebulizer solution 2.5 mg  2.5 mg Nebulization Q6H Lonia Blood, MD   2.5 mg at 08/31/11 0847  . amLODipine (NORVASC) tablet 10 mg  10 mg Oral Daily Richarda Overlie, MD   10 mg at 08/31/11 1031  . aspirin chewable tablet 324 mg  324 mg Oral Once Carlyle Dolly, PA-C   324 mg at 08/30/11 2315  . aspirin chewable tablet 81 mg  81 mg Oral Daily Lonia Blood, MD   81 mg at 08/31/11 1031  . atorvastatin (LIPITOR) tablet 10 mg  10 mg Oral Daily Lonia Blood, MD   10 mg at 08/31/11 1031  . chlorthalidone (HYGROTON) tablet 12.5 mg  12.5 mg Oral Daily Lonia Blood, MD   12.5 mg at 08/31/11 1030  . docusate sodium (COLACE) capsule 250 mg  250 mg Oral QHS Richarda Overlie, MD      . donepezil (ARICEPT) tablet 10 mg  10 mg Oral Daily Lonia Blood, MD   10 mg at 08/31/11 1030  . enoxaparin (LOVENOX) injection 40 mg  40 mg Subcutaneous Q24H Lonia Blood, MD   40 mg at 08/31/11 1031  . fentaNYL (DURAGESIC - dosed  mcg/hr) patch 25 mcg  25 mcg Transdermal Q72H Lonia Blood, MD   25 mcg at 08/31/11 1030  . glimepiride (AMARYL) tablet 2 mg  2 mg Oral QAC breakfast Lonia Blood, MD   2 mg at 08/31/11 0829  . ipratropium (ATROVENT) nebulizer solution 0.5 mg  0.5 mg Nebulization Q6H Lonia Blood, MD   0.5 mg at 08/31/11 0847  . irbesartan (AVAPRO) tablet 300 mg  300 mg Oral Daily Richarda Overlie, MD   300 mg at 08/31/11 1030  . levothyroxine (SYNTHROID, LEVOTHROID) tablet 50 mcg  50 mcg Oral Daily Lonia Blood, MD   50 mcg at 08/31/11 1030  . mulitivitamin with minerals tablet 1 tablet  1 tablet Oral Daily Lonia Blood, MD   1 tablet at 08/31/11 1030  . nitroGLYCERIN (NITROSTAT) SL tablet 0.4 mg  0.4 mg Sublingual Q5 min PRN Lonia Blood, MD      . oxyCODONE (Oxy IR/ROXICODONE) immediate release tablet 10 mg  10 mg Oral Q6H PRN Lonia Blood, MD      . pantoprazole (PROTONIX) EC tablet 40 mg  40 mg Oral Q1200 Richarda Overlie, MD      . potassium chloride SA (K-DUR,KLOR-CON) CR tablet 20 mEq  20 mEq Oral BID Richarda Overlie, MD  20 mEq at 08/31/11 1030  . pregabalin (LYRICA) capsule 50 mg  50 mg Oral BID Lonia Blood, MD   50 mg at 08/31/11 1030  . sodium chloride 0.9 % injection 3 mL  3 mL Intravenous Q12H Lonia Blood, MD      . sodium chloride 0.9 % injection 3 mL  3 mL Intravenous Q12H Lonia Blood, MD      . sodium chloride 0.9 % injection 3 mL  3 mL Intravenous PRN Lonia Blood, MD      . DISCONTD: amLODipine-valsartan (EXFORGE) 10-320 MG per tablet 1 tablet  1 tablet Oral Daily Lonia Blood, MD      . DISCONTD: docusate sodium (COLACE) capsule 250 mg  250 mg Oral Daily Lonia Blood, MD      . DISCONTD: metFORMIN (GLUCOPHAGE) tablet 500 mg  500 mg Oral BID WC Lonia Blood, MD       Medications Prior to Admission  Medication Sig Dispense Refill  . aspirin 81 MG tablet Take 81 mg by mouth daily.        . Cholecalciferol (VITAMIN D) 1000 UNITS capsule Take 1,000 Units by mouth daily.        [name] docusate sodium (COLACE) 250 MG  capsule Take 250 mg by mouth daily.        . fish oil-omega-3 fatty acids 1000 MG capsule Take 1 g by mouth daily.       . Multiple Vitamin (MULTIVITAMIN) tablet Take 1 tablet by mouth daily.        . pregabalin (LYRICA) 50 MG capsule Take 50 mg by mouth 2 (two) times daily.       . fentaNYL (DURAGESIC - DOSED MCG/HR) 25 MCG/HR Place 1 patch (25 mcg total) onto the skin every 3 (three) days.  3 patch  0  . pantoprazole (PROTONIX) 40 MG tablet Take 1 tablet (40 mg total) by mouth daily at 12 noon.  40 tablet  2    Allergies  Allergen Reactions  . Codeine Phosphate     REACTION: unspecified    Past Medical History  Diagnosis Date  . Hypertension   . Diabetes mellitus   . Hyperlipidemia   . Memory loss     mild  . Rectal ulcer   . CAD (coronary artery disease)     S/P CABG x 5 1998; S/P stent LCX 2009  . Constipation   . History of ventricular fibrillation 1998    while on treadmill  . Hypothyroidism   . Prostate cancer   . IBS (irritable bowel syndrome)   . Colitis   . ED (erectile dysfunction)   . OA (osteoarthritis)     Past Surgical History  Procedure Date  . Coronary artery bypass graft 1998    x5, with a left internal mammary to the LAD and diagonal, vein graft to the OM1 and 2, and a vein graft to the right coronary artery  . Coronary stent placement 11/13/2007    LCX    History   Social History  . Marital Status: Married    Spouse Name: N/A    Number of Children: N/A  . Years of Education: N/A   Occupational History  . Not on file.   Social History Main Topics  . Smoking status: Never Smoker   . Smokeless tobacco: Never Used  . Alcohol Use: No  . Drug Use: No  . Sexually Active: Not on file   Other Topics Concern  . Not on file  Social History Narrative  . No narrative on file    Family History  Problem Relation Age of Onset  . Stroke Father   . Pneumonia Father   . Colon cancer Neg Hx     ROS:  Problems with pain in left hip and left  lower extremity but no fevers or chills, productive cough, hemoptysis, dysphasia, odynophagia, melena, hematochezia, dysuria, hematuria, rash, seizure activity, orthopnea, PND, pedal edema, claudication. Remaining systems are negative.  Physical Exam:   Blood pressure 103/56, pulse 48, temperature 97.9 F (36.6 C), temperature source Oral, resp. rate 18, height 5\' 8"  (1.727 m), weight 180 lb 3.2 oz (81.738 kg), SpO2 96.00%.  General:  Well developed/well nourished in NAD Skin warm/dry Patient not depressed No peripheral clubbing Back-normal HEENT-normal/normal eyelids Neck supple/normal carotid upstroke bilaterally; right carotid bruit; no JVD; no thyromegaly chest - CTA/ normal expansion CV - RRR/normal S1 and S2; no murmurs, rubs or gallops;  PMI nondisplaced Abdomen -NT/ND, no HSM, no mass, + bowel sounds, no bruit 2+ femoral pulses, no bruits Ext-no edema, chords, 2+ DP Neuro-grossly nonfocal  ECG  sinus rhythm with right bundle branch block. No acute ST changes.  Results for orders placed during the hospital encounter of 08/30/11 (from the past 48 hour(s))  POCT I-STAT TROPONIN I     Status: Normal   Collection Time   08/30/11  6:33 PM      Component Value Range Comment   Troponin i, poc 0.00  0.00 - 0.08 (ng/mL)    Comment 3            BASIC METABOLIC PANEL     Status: Abnormal   Collection Time   08/30/11  6:35 PM      Component Value Range Comment   Sodium 138  135 - 145 (mEq/L)    Potassium 3.5  3.5 - 5.1 (mEq/L)    Chloride 103  96 - 112 (mEq/L)    CO2 26  19 - 32 (mEq/L)    Glucose, Bld 274 (*) 70 - 99 (mg/dL)    BUN 28 (*) 6 - 23 (mg/dL)    Creatinine, Ser 1.61 (*) 0.50 - 1.35 (mg/dL)    Calcium 9.6  8.4 - 10.5 (mg/dL)    GFR calc non Af Amer 45 (*) >90 (mL/min)    GFR calc Af Amer 53 (*) >90 (mL/min)   CBC     Status: Abnormal   Collection Time   08/30/11  6:35 PM      Component Value Range Comment   WBC 4.1  4.0 - 10.5 (K/uL)    RBC 3.39 (*) 4.22 - 5.81  (MIL/uL)    Hemoglobin 10.6 (*) 13.0 - 17.0 (g/dL)    HCT 09.6 (*) 04.5 - 52.0 (%)    MCV 90.0  78.0 - 100.0 (fL)    MCH 31.3  26.0 - 34.0 (pg)    MCHC 34.8  30.0 - 36.0 (g/dL)    RDW 40.9  81.1 - 91.4 (%)    Platelets 146 (*) 150 - 400 (K/uL)   TROPONIN I     Status: Normal   Collection Time   08/30/11  9:12 PM      Component Value Range Comment   Troponin I <0.30  <0.30 (ng/mL)   CBC     Status: Abnormal   Collection Time   08/31/11  2:30 AM      Component Value Range Comment   WBC 3.6 (*) 4.0 - 10.5 (K/uL)  RBC 3.40 (*) 4.22 - 5.81 (MIL/uL)    Hemoglobin 10.7 (*) 13.0 - 17.0 (g/dL)    HCT 04.5 (*) 40.9 - 52.0 (%)    MCV 89.4  78.0 - 100.0 (fL)    MCH 31.5  26.0 - 34.0 (pg)    MCHC 35.2  30.0 - 36.0 (g/dL)    RDW 81.1  91.4 - 78.2 (%)    Platelets 121 (*) 150 - 400 (K/uL)   CREATININE, SERUM     Status: Abnormal   Collection Time   08/31/11  2:30 AM      Component Value Range Comment   Creatinine, Ser 1.39 (*) 0.50 - 1.35 (mg/dL)    GFR calc non Af Amer 47 (*) >90 (mL/min)    GFR calc Af Amer 54 (*) >90 (mL/min)   CARDIAC PANEL(CRET KIN+CKTOT+MB+TROPI)     Status: Normal   Collection Time   08/31/11  2:43 AM      Component Value Range Comment   Total CK 81  7 - 232 (U/L)    CK, MB 3.0  0.3 - 4.0 (ng/mL)    Troponin I <0.30  <0.30 (ng/mL)    Relative Index RELATIVE INDEX IS INVALID  0.0 - 2.5    COMPREHENSIVE METABOLIC PANEL     Status: Abnormal   Collection Time   08/31/11  2:45 AM      Component Value Range Comment   Sodium 139  135 - 145 (mEq/L)    Potassium 3.3 (*) 3.5 - 5.1 (mEq/L)    Chloride 103  96 - 112 (mEq/L)    CO2 26  19 - 32 (mEq/L)    Glucose, Bld 299 (*) 70 - 99 (mg/dL)    BUN 24 (*) 6 - 23 (mg/dL)    Creatinine, Ser 9.56 (*) 0.50 - 1.35 (mg/dL)    Calcium 9.2  8.4 - 10.5 (mg/dL)    Total Protein 5.8 (*) 6.0 - 8.3 (g/dL)    Albumin 3.3 (*) 3.5 - 5.2 (g/dL)    AST 14  0 - 37 (U/L)    ALT 14  0 - 53 (U/L)    Alkaline Phosphatase 43  39 - 117 (U/L)     Total Bilirubin 0.5  0.3 - 1.2 (mg/dL)    GFR calc non Af Amer 47 (*) >90 (mL/min)    GFR calc Af Amer 54 (*) >90 (mL/min)   CBC     Status: Abnormal   Collection Time   08/31/11  2:45 AM      Component Value Range Comment   WBC 3.8 (*) 4.0 - 10.5 (K/uL)    RBC 3.44 (*) 4.22 - 5.81 (MIL/uL)    Hemoglobin 10.6 (*) 13.0 - 17.0 (g/dL)    HCT 21.3 (*) 08.6 - 52.0 (%)    MCV 89.2  78.0 - 100.0 (fL)    MCH 30.8  26.0 - 34.0 (pg)    MCHC 34.5  30.0 - 36.0 (g/dL)    RDW 57.8  46.9 - 62.9 (%)    Platelets 126 (*) 150 - 400 (K/uL)   GLUCOSE, CAPILLARY     Status: Abnormal   Collection Time   08/31/11  8:04 AM      Component Value Range Comment   Glucose-Capillary 214 (*) 70 - 99 (mg/dL)   CARDIAC PANEL(CRET KIN+CKTOT+MB+TROPI)     Status: Normal   Collection Time   08/31/11  9:35 AM      Component Value Range Comment   Total CK  79  7 - 232 (U/L)    CK, MB 3.0  0.3 - 4.0 (ng/mL)    Troponin I <0.30  <0.30 (ng/mL)    Relative Index RELATIVE INDEX IS INVALID  0.0 - 2.5    D-DIMER, QUANTITATIVE     Status: Normal   Collection Time   08/31/11  9:35 AM      Component Value Range Comment   D-Dimer, Quant <0.22  0.00 - 0.48 (ug/mL-FEU)     Dg Chest 2 View  08/30/2011  *RADIOLOGY REPORT*  Clinical Data: Shortness of breath and headache  CHEST - 2 VIEW  Comparison: 01/21/2011  Findings: Prior median sternotomy and CABG procedure.  The heart size and mediastinal contours are within normal limits.  Both lungs are clear.  The visualized skeletal structures are unremarkable.  IMPRESSION: No active cardiopulmonary abnormalities.  Original Report Authenticated By: Rosealee Albee, M.D.   Ct Head Wo Contrast  08/30/2011  *RADIOLOGY REPORT*  Clinical Data: Headache, shortness of breath, history hypertension, diabetes, prostate cancer  CT HEAD WITHOUT CONTRAST  Technique:  Contiguous axial images were obtained from the base of the skull through the vertex without contrast.  Comparison: 01/21/2011  Findings:  Generalized atrophy. Normal ventricular morphology. No midline shift or mass effect. Small vessel chronic ischemic changes of deep cerebral white matter. No intracranial hemorrhage, mass lesion, or acute infarction. Visualized paranasal sinuses and mastoid air cells clear. Bones unremarkable. Atherosclerotic calcification within internal carotid and vertebral arteries at skull base.  IMPRESSION: Atrophy with small vessel chronic ischemic changes of deep cerebral white matter. No acute intracranial abnormalities.  Original Report Authenticated By: Lollie Marrow, M.D.    Assessment/Plan #1-chest pain-symptoms with both typical and apical. Enzymes negative. No acute ST changes. Plan Myoview for risk stratification. #2-coronary artery disease-continue aspirin and statin. #3-chronic renal insufficiency-management per primary care. #4-prostate cancer #5-hypertension-continue present blood pressure medications. #6-hyperlipidemia-continue statin.  Olga Millers MD 08/31/2011, 11:03 AM

## 2011-08-31 NOTE — H&P (Signed)
[name] [name] is an 78 y.o. male.   Chief Complaint: Chest pain HPI: 78 YO with history of CAD and multiple medical problems here with chest pain. Rated as 610, substernal, sharp, relieved by Nitro.  Normal EKG and Enzymes, normal CXR in ER. Has been taking his regular medications. Has numerous problems including DM, HTN and Hyperlipidemia.  Past Medical History  Diagnosis Date  . Hypertension   . Diabetes mellitus   . Hyperlipidemia   . Fatigue   . Memory loss     mild  . Rectal ulcer   . IHD (ischemic heart disease)     S/P CABG x 5 1998; S/P stent LCX 2009  . Constipation   . Bruises easily   . History of ventricular fibrillation 1998    while on treadmill  . Hypothyroidism   . Prostate cancer   . IBS (irritable bowel syndrome)   . Colitis   . ED (erectile dysfunction)   . OA (osteoarthritis)     Past Surgical History  Procedure Date  . Coronary artery bypass graft 1998    x5, with a left internal mammary to the LAD and diagonal, vein graft to the OM1 and 2, and a vein graft to the right coronary artery  . Coronary stent placement 11/13/2007    LCX  . Cardiovascular stress test 2010    Family History  Problem Relation Age of Onset  . Stroke Father   . Pneumonia Father   . Colon cancer Neg Hx    Social History:  reports that he has never smoked. He has never used smokeless tobacco. He reports that he does not drink alcohol or use illicit drugs.  Allergies:  Allergies  Allergen Reactions  . Codeine Phosphate     REACTION: unspecified    Medications Prior to Admission  Medication Dose Route Frequency Provider Last Rate Last Dose  . 0.9 %  sodium chloride infusion  250 mL Intravenous PRN Lonia Blood, MD      . acetaminophen (TYLENOL) tablet 650 mg  650 mg Oral Q6H PRN Lonia Blood, MD       Or  . acetaminophen (TYLENOL) suppository 650 mg  650 mg Rectal Q6H PRN Lonia Blood, MD      . albuterol (PROVENTIL) (5 MG/ML) 0.5% nebulizer solution 2.5 mg  2.5 mg  Nebulization Q6H Lonia Blood, MD      . amLODipine (NORVASC) tablet 10 mg  10 mg Oral Daily Richarda Overlie, MD      . aspirin chewable tablet 324 mg  324 mg Oral Once Jamesetta Orleans Lawyer, PA-C   324 mg at 08/30/11 2315  . aspirin chewable tablet 81 mg  81 mg Oral Daily Lonia Blood, MD      . atorvastatin (LIPITOR) tablet 10 mg  10 mg Oral Daily Lonia Blood, MD      . chlorthalidone (HYGROTON) tablet 12.5 mg  12.5 mg Oral Daily Lonia Blood, MD      . docusate sodium (COLACE) capsule 250 mg  250 mg Oral QHS Richarda Overlie, MD      . donepezil (ARICEPT) tablet 10 mg  10 mg Oral Daily Lonia Blood, MD      . enoxaparin (LOVENOX) injection 40 mg  40 mg Subcutaneous Q24H Lonia Blood, MD      . fentaNYL (DURAGESIC - dosed mcg/hr) patch 25 mcg  25 mcg Transdermal Q72H Lonia Blood, MD      . glimepiride (AMARYL) tablet 2 mg  2 mg Oral  QAC breakfast Lonia Blood, MD      . ipratropium (ATROVENT) nebulizer solution 0.5 mg  0.5 mg Nebulization Q6H Lonia Blood, MD      . irbesartan (AVAPRO) tablet 300 mg  300 mg Oral Daily Richarda Overlie, MD      . levothyroxine (SYNTHROID, LEVOTHROID) tablet 50 mcg  50 mcg Oral Daily Lonia Blood, MD      . metFORMIN (GLUCOPHAGE) tablet 500 mg  500 mg Oral BID WC Lonia Blood, MD      . mulitivitamin with minerals tablet 1 tablet  1 tablet Oral Daily Lonia Blood, MD      . nitroGLYCERIN (NITROSTAT) SL tablet 0.4 mg  0.4 mg Sublingual Q5 min PRN Lonia Blood, MD      . oxyCODONE (Oxy IR/ROXICODONE) immediate release tablet 10 mg  10 mg Oral Q6H PRN Lonia Blood, MD      . pregabalin (LYRICA) capsule 50 mg  50 mg Oral BID Lonia Blood, MD      . sodium chloride 0.9 % injection 3 mL  3 mL Intravenous Q12H Lonia Blood, MD      . sodium chloride 0.9 % injection 3 mL  3 mL Intravenous Q12H Lonia Blood, MD      . sodium chloride 0.9 % injection 3 mL  3 mL Intravenous PRN Lonia Blood, MD      . DISCONTD: amLODipine-valsartan (EXFORGE) 10-320 MG per tablet 1 tablet  1 tablet Oral Daily Lonia Blood, MD      . DISCONTD: docusate sodium (COLACE) capsule 250 mg  250 mg Oral Daily Lonia Blood, MD       Medications Prior to Admission  Medication Sig Dispense Refill  . aspirin 81 MG tablet Take 81 mg by mouth daily.        . Cholecalciferol (VITAMIN D) 1000 UNITS capsule Take 1,000 Units by mouth daily.        [name] docusate sodium (COLACE) 250 MG capsule Take 250 mg by mouth daily.        . fish oil-omega-3 fatty acids 1000 MG capsule Take 1 g by mouth daily.       . Multiple Vitamin (MULTIVITAMIN) tablet Take 1 tablet by mouth daily.        . pregabalin (LYRICA) 50 MG capsule Take 50 mg by mouth 2 (two) times daily.         Results for orders placed during the hospital encounter of 08/30/11 (from the past 48 hour(s))  POCT I-STAT TROPONIN I     Status: Normal   Collection Time   08/30/11  6:33 PM      Component Value Range Comment   Troponin i, poc 0.00  0.00 - 0.08 (ng/mL)    Comment 3            BASIC METABOLIC PANEL     Status: Abnormal   Collection Time   08/30/11  6:35 PM      Component Value Range Comment   Sodium 138  135 - 145 (mEq/L)    Potassium 3.5  3.5 - 5.1 (mEq/L)    Chloride 103  96 - 112 (mEq/L)    CO2 26  19 - 32 (mEq/L)    Glucose, Bld 274 (*) 70 - 99 (mg/dL)    BUN 28 (*) 6 - 23 (mg/dL)    Creatinine, Ser 1.47 (*) 0.50 - 1.35 (mg/dL)    Calcium 9.6  8.4 - 10.5 (mg/dL)    GFR calc non Af Denyse Dago  45 (*) >90 (mL/min)    GFR calc Af Amer 53 (*) >90 (mL/min)   CBC     Status: Abnormal   Collection Time   08/30/11  6:35 PM      Component Value Range Comment   WBC 4.1  4.0 - 10.5 (K/uL)    RBC 3.39 (*) 4.22 - 5.81 (MIL/uL)    Hemoglobin 10.6 (*) 13.0 - 17.0 (g/dL)    HCT 40.9 (*) 81.1 - 52.0 (%)    MCV 90.0  78.0 - 100.0 (fL)    MCH 31.3  26.0 - 34.0 (pg)    MCHC 34.8  30.0 - 36.0 (g/dL)    RDW 91.4  78.2 - 95.6 (%)    Platelets 146 (*) 150 - 400 (K/uL)   TROPONIN I     Status: Normal   Collection Time   08/30/11  9:12 PM      Component Value Range Comment    Troponin I <0.30  <0.30 (ng/mL)   CBC     Status: Abnormal   Collection Time   08/31/11  2:30 AM      Component Value Range Comment   WBC 3.6 (*) 4.0 - 10.5 (K/uL)    RBC 3.40 (*) 4.22 - 5.81 (MIL/uL)    Hemoglobin 10.7 (*) 13.0 - 17.0 (g/dL)    HCT 21.3 (*) 08.6 - 52.0 (%)    MCV 89.4  78.0 - 100.0 (fL)    MCH 31.5  26.0 - 34.0 (pg)    MCHC 35.2  30.0 - 36.0 (g/dL)    RDW 57.8  46.9 - 62.9 (%)    Platelets 121 (*) 150 - 400 (K/uL)   CREATININE, SERUM     Status: Abnormal   Collection Time   08/31/11  2:30 AM      Component Value Range Comment   Creatinine, Ser 1.39 (*) 0.50 - 1.35 (mg/dL)    GFR calc non Af Amer 47 (*) >90 (mL/min)    GFR calc Af Amer 54 (*) >90 (mL/min)   CARDIAC PANEL(CRET KIN+CKTOT+MB+TROPI)     Status: Normal   Collection Time   08/31/11  2:43 AM      Component Value Range Comment   Total CK 81  7 - 232 (U/L)    CK, MB 3.0  0.3 - 4.0 (ng/mL)    Troponin I <0.30  <0.30 (ng/mL)    Relative Index RELATIVE INDEX IS INVALID  0.0 - 2.5    COMPREHENSIVE METABOLIC PANEL     Status: Abnormal   Collection Time   08/31/11  2:45 AM      Component Value Range Comment   Sodium 139  135 - 145 (mEq/L)    Potassium 3.3 (*) 3.5 - 5.1 (mEq/L)    Chloride 103  96 - 112 (mEq/L)    CO2 26  19 - 32 (mEq/L)    Glucose, Bld 299 (*) 70 - 99 (mg/dL)    BUN 24 (*) 6 - 23 (mg/dL)    Creatinine, Ser 5.28 (*) 0.50 - 1.35 (mg/dL)    Calcium 9.2  8.4 - 10.5 (mg/dL)    Total Protein 5.8 (*) 6.0 - 8.3 (g/dL)    Albumin 3.3 (*) 3.5 - 5.2 (g/dL)    AST 14  0 - 37 (U/L)    ALT 14  0 - 53 (U/L)    Alkaline Phosphatase 43  39 - 117 (U/L)    Total Bilirubin 0.5  0.3 - 1.2 (mg/dL)  GFR calc non Af Amer 47 (*) >90 (mL/min)    GFR calc Af Amer 54 (*) >90 (mL/min)   CBC     Status: Abnormal   Collection Time   08/31/11  2:45 AM      Component Value Range Comment   WBC 3.8 (*) 4.0 - 10.5 (K/uL)    RBC 3.44 (*) 4.22 - 5.81 (MIL/uL)    Hemoglobin 10.6 (*) 13.0 - 17.0 (g/dL)    HCT 16.1 (*) 09.6  - 52.0 (%)    MCV 89.2  78.0 - 100.0 (fL)    MCH 30.8  26.0 - 34.0 (pg)    MCHC 34.5  30.0 - 36.0 (g/dL)    RDW 04.5  40.9 - 81.1 (%)    Platelets 126 (*) 150 - 400 (K/uL)    Dg Chest 2 View  08/30/2011  *RADIOLOGY REPORT*  Clinical Data: Shortness of breath and headache  CHEST - 2 VIEW  Comparison: 01/21/2011  Findings: Prior median sternotomy and CABG procedure.  The heart size and mediastinal contours are within normal limits.  Both lungs are clear.  The visualized skeletal structures are unremarkable.  IMPRESSION: No active cardiopulmonary abnormalities.  Original Report Authenticated By: Rosealee Albee, M.D.   Ct Head Wo Contrast  08/30/2011  *RADIOLOGY REPORT*  Clinical Data: Headache, shortness of breath, history hypertension, diabetes, prostate cancer  CT HEAD WITHOUT CONTRAST  Technique:  Contiguous axial images were obtained from the base of the skull through the vertex without contrast.  Comparison: 01/21/2011  Findings: Generalized atrophy. Normal ventricular morphology. No midline shift or mass effect. Small vessel chronic ischemic changes of deep cerebral white matter. No intracranial hemorrhage, mass lesion, or acute infarction. Visualized paranasal sinuses and mastoid air cells clear. Bones unremarkable. Atherosclerotic calcification within internal carotid and vertebral arteries at skull base.  IMPRESSION: Atrophy with small vessel chronic ischemic changes of deep cerebral white matter. No acute intracranial abnormalities.  Original Report Authenticated By: Lollie Marrow, M.D.    Review of Systems  Constitutional: Positive for malaise/fatigue.  HENT: Negative.   Respiratory: Positive for cough and shortness of breath. Negative for hemoptysis, sputum production and wheezing.   Cardiovascular: Positive for chest pain. Negative for palpitations, orthopnea, claudication, leg swelling and PND.  Gastrointestinal: Negative.   Genitourinary: Negative.   Musculoskeletal: Negative.     Skin: Negative.   Neurological: Negative.   Endo/Heme/Allergies: Negative.   Psychiatric/Behavioral: Negative.     Blood pressure 112/58, pulse 48, temperature 97.9 F (36.6 C), temperature source Oral, resp. rate 18, height 5\' 8"  (1.727 m), weight 81.738 kg (180 lb 3.2 oz), SpO2 98.00%. Physical Exam  Constitutional: He is oriented to person, place, and time. He appears well-developed and well-nourished.  HENT:  Head: Normocephalic and atraumatic.  Right Ear: External ear normal.  Left Ear: External ear normal.  Nose: Nose normal.  Mouth/Throat: Oropharynx is clear and moist.  Eyes: Conjunctivae and EOM are normal. Pupils are equal, round, and reactive to light.  Neck: Normal range of motion. Neck supple.  Cardiovascular: Normal rate, regular rhythm, normal heart sounds and intact distal pulses.   Respiratory: Effort normal and breath sounds normal.  GI: Soft. Bowel sounds are normal.  Musculoskeletal: Normal range of motion.  Neurological: He is alert and oriented to person, place, and time. He has normal reflexes.  Skin: Skin is warm and dry.  Psychiatric: He has a normal mood and affect. His behavior is normal. Judgment and thought content normal.  Assessment/Plan 1. Chest Pain: admit, check enzymes, Tele monitoring, 2D echo if no recent one. Will need further risk stratification if enzymes aer negative. 2. HTN: Home medications with titration if necessary 3. DM2: Continue home meds with SSI 4. Hypothyroidsm: continue Syntheroid.  Delylah Stanczyk,LAWAL 08/31/2011, 6:35 AM

## 2011-09-01 ENCOUNTER — Inpatient Hospital Stay (HOSPITAL_COMMUNITY): Payer: Medicare Other

## 2011-09-01 DIAGNOSIS — R079 Chest pain, unspecified: Secondary | ICD-10-CM

## 2011-09-01 MED ORDER — REGADENOSON 0.4 MG/5ML IV SOLN
0.4000 mg | Freq: Once | INTRAVENOUS | Status: AC
  Administered 2011-09-01: 0.4 mg via INTRAVENOUS

## 2011-09-01 MED ORDER — TECHNETIUM TC 99M TETROFOSMIN IV KIT
30.0000 | PACK | Freq: Once | INTRAVENOUS | Status: AC | PRN
  Administered 2011-09-01: 30 via INTRAVENOUS

## 2011-09-01 MED ORDER — TECHNETIUM TC 99M TETROFOSMIN IV KIT
10.0000 | PACK | Freq: Once | INTRAVENOUS | Status: AC | PRN
  Administered 2011-09-01: 10 via INTRAVENOUS

## 2011-09-01 NOTE — ED Provider Notes (Signed)
Pt with chest pressure. pe lungs clear,  Heart rrr with out mrg. Admit for chest pain.  Medical screening examination/treatment/procedure(s) were conducted as a shared visit with non-physician practitioner(s) and myself.  I personally evaluated the patient during the encounter   Benny Lennert, MD 09/01/11 [phone]

## 2011-09-01 NOTE — Progress Notes (Signed)
@   Subjective:  Denies CP or dyspnea   Objective:  Filed Vitals:   09/01/11 0900 09/01/11 0928 09/01/11 0930 09/01/11 0932  BP: 110/60 112/67 128/63 130/63  Pulse: 48 78 74 67  Temp:      TempSrc:      Resp:      Height:      Weight:      SpO2:        Intake/Output from previous day:  Intake/Output Summary (Last 24 hours) at 09/01/11 1053 Last data filed at 08/31/11 1300  Gross per 24 hour  Intake    360 ml  Output      0 ml  Net    360 ml    Physical Exam: Physical exam: Well-developed well-nourished in no acute distress.  Skin is warm and dry.  HEENT is normal.  Neck is supple. No thyromegaly.  Chest is clear to auscultation with normal expansion.  Cardiovascular exam is regular rate and rhythm.  Abdominal exam nontender or distended. No masses palpated. Extremities show no edema. neuro grossly intact    Lab Results: Basic Metabolic Panel:  Basename 08/31/11 0245 08/31/11 0230 08/30/11 1835  NA 139 -- 138  K 3.3* -- 3.5  CL 103 -- 103  CO2 26 -- 26  GLUCOSE 299* -- 274*  BUN 24* -- 28*  CREATININE 1.39* 1.39* --  CALCIUM 9.2 -- 9.6  MG -- -- --  PHOS -- -- --   CBC:  Basename 08/31/11 0245 08/31/11 0230  WBC 3.8* 3.6*  NEUTROABS -- --  HGB 10.6* 10.7*  HCT 30.7* 30.4*  MCV 89.2 89.4  PLT 126* 121*   Cardiac Enzymes:  Basename 08/31/11 1818 08/31/11 0935 08/31/11 0243  CKTOTAL 78 79 81  CKMB 2.9 3.0 3.0  CKMBINDEX -- -- --  TROPONINI <0.30 <0.30 <0.30     Assessment/Plan:  1) chest pain - enzymes negative; await myoview; if neg or low risk, dc today and fu with me in 2-4 weeks for cardiac issues. Continue all preadmission meds at DC.  Olga Millers 09/01/2011, 10:53 AM

## 2011-09-01 NOTE — Progress Notes (Signed)
Pt completed Lexiscan Myoview, with no noted complications.

## 2011-09-01 NOTE — Discharge Summary (Signed)
Physician Discharge Summary  MORAD TAL MRN: [id number] DOB/AGE: [birth year] 78 y.o.  PCP: Rogelia Boga, MD, MD   Admit date: 08/30/2011 Discharge date: 09/01/2011  Discharge Diagnoses:     *Chest pain status post Lexi scan Myoview    HYPOTHYROIDISM  DIABETES MELLITUS, TYPE II  HYPERTENSION   Medication List  As of 09/01/2011  9:55 AM   TAKE these medications         amLODipine-valsartan 10-320 MG per tablet   Commonly known as: EXFORGE   Take 1 tablet by mouth daily.      aspirin 81 MG tablet   Take 81 mg by mouth daily.      atorvastatin 10 MG tablet   Commonly known as: LIPITOR   Take 10 mg by mouth daily.      chlorthalidone 25 MG tablet   Commonly known as: HYGROTON   Take 12.5 mg by mouth daily.      docusate sodium 250 MG capsule   Commonly known as: COLACE   Take 250 mg by mouth daily.      donepezil 10 MG tablet   Commonly known as: ARICEPT   Take 10 mg by mouth daily.      fentaNYL 25 MCG/HR   Commonly known as: DURAGESIC - dosed mcg/hr   Place 1 patch (25 mcg total) onto the skin every 3 (three) days.      fish oil-omega-3 fatty acids 1000 MG capsule   Take 1 g by mouth daily.      glimepiride 4 MG tablet   Commonly known as: AMARYL   Take 2 mg by mouth daily before breakfast.      levothyroxine 50 MCG tablet   Commonly known as: SYNTHROID, LEVOTHROID   Take 50 mcg by mouth daily.      metFORMIN 500 MG tablet   Commonly known as: GLUCOPHAGE   Take 500 mg by mouth 2 (two) times daily with a meal. Takes 1000 mg in the Am and 500 mg in the pm      multivitamin tablet   Take 1 tablet by mouth daily.      nitroGLYCERIN 0.4 MG SL tablet   Commonly known as: NITROSTAT   Place 0.4 mg under the tongue every 5 (five) minutes as needed. For chest pain      pantoprazole 40 MG tablet   Commonly known as: PROTONIX   Take 1 tablet (40 mg total) by mouth daily at 12 noon.      pregabalin 50 MG capsule   Commonly known as: LYRICA   Take 50 mg by mouth 2 (two) times daily.      tapentadol 50 MG Tabs   Commonly known as: NUCYNTA   Take 50 mg by mouth every 6 (six) hours as needed. For pain      tapentadol 50 MG Tabs   Commonly known as: NUCYNTA   Take 100 mg by mouth every 6 (six) hours as needed. For pain      Vitamin D 1000 UNITS capsule   Take 1,000 Units by mouth daily.      XGEVA [place]   Inject into the skin.            Discharge Condition: Stable Disposition: 01-Home or Self Care   Consults: Cardiology,Brian Ludwig Clarks, MD   Significant Diagnostic Studies: Dg Chest 2 View  08/30/2011  *RADIOLOGY REPORT*  Clinical Data: Shortness of breath and headache  CHEST - 2 VIEW  Comparison: 01/21/2011  Findings: Prior median sternotomy and CABG procedure.  The heart size and mediastinal contours are within normal limits.  Both lungs are clear.  The visualized skeletal structures are unremarkable.  IMPRESSION: No active cardiopulmonary abnormalities.  Original Report Authenticated By: Rosealee Albee, M.D.   Ct Head Wo Contrast  08/30/2011  *RADIOLOGY REPORT*  Clinical Data: Headache, shortness of breath, history hypertension, diabetes, prostate cancer  CT HEAD WITHOUT CONTRAST  Technique:  Contiguous axial images were obtained from the base of the skull through the vertex without contrast.  Comparison: 01/21/2011  Findings: Generalized atrophy. Normal ventricular morphology. No midline shift or mass effect. Small vessel chronic ischemic changes of deep cerebral white matter. No intracranial hemorrhage, mass lesion, or acute infarction. Visualized paranasal sinuses and mastoid air cells clear. Bones unremarkable. Atherosclerotic calcification within internal carotid and vertebral arteries at skull base.  IMPRESSION: Atrophy with small vessel chronic ischemic changes of deep cerebral white matter. No acute intracranial abnormalities.  Original Report Authenticated By: Lollie Marrow, M.D.      Microbiology: No results  found for this or any previous visit (from the past 240 hour(s)).   Labs: Results for orders placed during the hospital encounter of 08/30/11 (from the past 48 hour(s))  POCT I-STAT TROPONIN I     Status: Normal   Collection Time   08/30/11  6:33 PM      Component Value Range Comment   Troponin i, poc 0.00  0.00 - 0.08 (ng/mL)    Comment 3            BASIC METABOLIC PANEL     Status: Abnormal   Collection Time   08/30/11  6:35 PM      Component Value Range Comment   Sodium 138  135 - 145 (mEq/L)    Potassium 3.5  3.5 - 5.1 (mEq/L)    Chloride 103  96 - 112 (mEq/L)    CO2 26  19 - 32 (mEq/L)    Glucose, Bld 274 (*) 70 - 99 (mg/dL)    BUN 28 (*) 6 - 23 (mg/dL)    Creatinine, Ser 0.86 (*) 0.50 - 1.35 (mg/dL)    Calcium 9.6  8.4 - 10.5 (mg/dL)    GFR calc non Af Amer 45 (*) >90 (mL/min)    GFR calc Af Amer 53 (*) >90 (mL/min)   CBC     Status: Abnormal   Collection Time   08/30/11  6:35 PM      Component Value Range Comment   WBC 4.1  4.0 - 10.5 (K/uL)    RBC 3.39 (*) 4.22 - 5.81 (MIL/uL)    Hemoglobin 10.6 (*) 13.0 - 17.0 (g/dL)    HCT 57.8 (*) 46.9 - 52.0 (%)    MCV 90.0  78.0 - 100.0 (fL)    MCH 31.3  26.0 - 34.0 (pg)    MCHC 34.8  30.0 - 36.0 (g/dL)    RDW 62.9  52.8 - 41.3 (%)    Platelets 146 (*) 150 - 400 (K/uL)   TROPONIN I     Status: Normal   Collection Time   08/30/11  9:12 PM      Component Value Range Comment   Troponin I <0.30  <0.30 (ng/mL)   CBC     Status: Abnormal   Collection Time   08/31/11  2:30 AM      Component Value Range Comment   WBC 3.6 (*) 4.0 - 10.5 (K/uL)    RBC 3.40 (*) 4.22 -  5.81 (MIL/uL)    Hemoglobin 10.7 (*) 13.0 - 17.0 (g/dL)    HCT 40.9 (*) 81.1 - 52.0 (%)    MCV 89.4  78.0 - 100.0 (fL)    MCH 31.5  26.0 - 34.0 (pg)    MCHC 35.2  30.0 - 36.0 (g/dL)    RDW 91.4  78.2 - 95.6 (%)    Platelets 121 (*) 150 - 400 (K/uL)   CREATININE, SERUM     Status: Abnormal   Collection Time   08/31/11  2:30 AM      Component Value Range Comment    Creatinine, Ser 1.39 (*) 0.50 - 1.35 (mg/dL)    GFR calc non Af Amer 47 (*) >90 (mL/min)    GFR calc Af Amer 54 (*) >90 (mL/min)   HEMOGLOBIN A1C     Status: Abnormal   Collection Time   08/31/11  2:30 AM      Component Value Range Comment   Hemoglobin A1C 7.0 (*) <5.7 (%)    Mean Plasma Glucose 154 (*) <117 (mg/dL)   CARDIAC PANEL(CRET KIN+CKTOT+MB+TROPI)     Status: Normal   Collection Time   08/31/11  2:43 AM      Component Value Range Comment   Total CK 81  7 - 232 (U/L)    CK, MB 3.0  0.3 - 4.0 (ng/mL)    Troponin I <0.30  <0.30 (ng/mL)    Relative Index RELATIVE INDEX IS INVALID  0.0 - 2.5    COMPREHENSIVE METABOLIC PANEL     Status: Abnormal   Collection Time   08/31/11  2:45 AM      Component Value Range Comment   Sodium 139  135 - 145 (mEq/L)    Potassium 3.3 (*) 3.5 - 5.1 (mEq/L)    Chloride 103  96 - 112 (mEq/L)    CO2 26  19 - 32 (mEq/L)    Glucose, Bld 299 (*) 70 - 99 (mg/dL)    BUN 24 (*) 6 - 23 (mg/dL)    Creatinine, Ser 2.13 (*) 0.50 - 1.35 (mg/dL)    Calcium 9.2  8.4 - 10.5 (mg/dL)    Total Protein 5.8 (*) 6.0 - 8.3 (g/dL)    Albumin 3.3 (*) 3.5 - 5.2 (g/dL)    AST 14  0 - 37 (U/L)    ALT 14  0 - 53 (U/L)    Alkaline Phosphatase 43  39 - 117 (U/L)    Total Bilirubin 0.5  0.3 - 1.2 (mg/dL)    GFR calc non Af Amer 47 (*) >90 (mL/min)    GFR calc Af Amer 54 (*) >90 (mL/min)   CBC     Status: Abnormal   Collection Time   08/31/11  2:45 AM      Component Value Range Comment   WBC 3.8 (*) 4.0 - 10.5 (K/uL)    RBC 3.44 (*) 4.22 - 5.81 (MIL/uL)    Hemoglobin 10.6 (*) 13.0 - 17.0 (g/dL)    HCT 08.6 (*) 57.8 - 52.0 (%)    MCV 89.2  78.0 - 100.0 (fL)    MCH 30.8  26.0 - 34.0 (pg)    MCHC 34.5  30.0 - 36.0 (g/dL)    RDW 46.9  62.9 - 52.8 (%)    Platelets 126 (*) 150 - 400 (K/uL)   GLUCOSE, CAPILLARY     Status: Abnormal   Collection Time   08/31/11  8:04 AM      Component Value Range Comment  Glucose-Capillary 214 (*) 70 - 99 (mg/dL)   CARDIAC PANEL(CRET  KIN+CKTOT+MB+TROPI)     Status: Normal   Collection Time   08/31/11  9:35 AM      Component Value Range Comment   Total CK 79  7 - 232 (U/L)    CK, MB 3.0  0.3 - 4.0 (ng/mL)    Troponin I <0.30  <0.30 (ng/mL)    Relative Index RELATIVE INDEX IS INVALID  0.0 - 2.5    D-DIMER, QUANTITATIVE     Status: Normal   Collection Time   08/31/11  9:35 AM      Component Value Range Comment   D-Dimer, Quant <0.22  0.00 - 0.48 (ug/mL-FEU)   CARDIAC PANEL(CRET KIN+CKTOT+MB+TROPI)     Status: Normal   Collection Time   08/31/11  6:18 PM      Component Value Range Comment   Total CK 78  7 - 232 (U/L)    CK, MB 2.9  0.3 - 4.0 (ng/mL)    Troponin I <0.30  <0.30 (ng/mL)    Relative Index RELATIVE INDEX IS INVALID  0.0 - 2.5    GLUCOSE, CAPILLARY     Status: Abnormal   Collection Time   09/01/11  7:44 AM      Component Value Range Comment   Glucose-Capillary 182 (*) 70 - 99 (mg/dL)      HPI 78 year old male with past medical history of coronary artery disease for evaluation of chest pain. Patient typically does not have dyspnea on exertion, orthopnea, PND, pedal edema, palpitations, syncope or exertional chest pain. Yesterday at approximately 3 PM he developed diffuse chest tightness. The pain was not pleuritic, positional or related to food. It lasted approximately 5 hours and resolves spontaneously. There was no nausea or diaphoresis there was shortness of breath. He also had tightness in his head. His enzymes have been negative and his electrocardiogram showed no ST changes. Cardiology asked to evaluate.  HOSPITAL COURSE:  #1-chest pain-he was noted to have occasional bradycardia which was asymptomatic. As far as his chest pain is concerned he has symptoms with both typical and apical. Enzymes negative. No acute ST changes. lexiscan Myoview has been done results are pending at this time. Cardiology has been consulted. Stress test is negative the patient will be discharged home  #2-coronary artery  disease-continue aspirin and statin.  #3-chronic renal insufficiency-management per primary care.  #4-prostate cancer stable during this admission #5-hypertension-continue present blood pressure medications.  #6-hyperlipidemia-continue statin.       Discharge Exam:  Blood pressure 130/63, pulse 67, temperature 97.8 F (36.6 C), temperature source Oral, resp. rate 14, height 5\' 8"  (1.727 m), weight 81.738 kg (180 lb 3.2 oz), SpO2 99.00%. General: Well developed/well nourished in NAD  Skin warm/dry  Patient not depressed  No peripheral clubbing  Back-normal  HEENT-normal/normal eyelids  Neck supple/normal carotid upstroke bilaterally; right carotid bruit; no JVD; no thyromegaly  chest - CTA/ normal expansion  CV - RRR/normal S1 and S2; no murmurs, rubs or gallops; PMI nondisplaced  Abdomen -NT/ND, no HSM, no mass, + bowel sounds, no bruit  2+ femoral pulses, no bruits  Ext-no edema, chords, 2+ DP  Neuro-grossly nonfocal     Discharge Orders    Future Appointments: Provider: Department: Dept Phone: Center:   09/01/2011 11:00 AM [facility]   10/28/2011 11:15 AM Rollene Rotunda, MD [facility] [phone] [id number]     Future Orders Please Complete By Expires  Diet - low sodium heart healthy      Increase activity slowly         Follow-up Information    Follow up with Rogelia Boga, MD .         Signed: Richarda Overlie 09/01/2011, 9:55 AM

## 2011-09-02 MED FILL — Regadenoson IV Inj 0.4 MG/5ML (0.08 MG/ML): INTRAVENOUS | Qty: 5 | Status: AC

## 2011-09-02 NOTE — Progress Notes (Signed)
Utilization review completed.  

## 2011-09-05 ENCOUNTER — Encounter: Payer: Self-pay | Admitting: Internal Medicine

## 2011-09-05 ENCOUNTER — Ambulatory Visit (INDEPENDENT_AMBULATORY_CARE_PROVIDER_SITE_OTHER): Payer: Medicare Other | Admitting: Internal Medicine

## 2011-09-05 DIAGNOSIS — I2510 Atherosclerotic heart disease of native coronary artery without angina pectoris: Secondary | ICD-10-CM

## 2011-09-05 DIAGNOSIS — D6489 Other specified anemias: Secondary | ICD-10-CM

## 2011-09-05 DIAGNOSIS — E1142 Type 2 diabetes mellitus with diabetic polyneuropathy: Secondary | ICD-10-CM

## 2011-09-05 DIAGNOSIS — E1149 Type 2 diabetes mellitus with other diabetic neurological complication: Secondary | ICD-10-CM

## 2011-09-05 DIAGNOSIS — E119 Type 2 diabetes mellitus without complications: Secondary | ICD-10-CM

## 2011-09-05 DIAGNOSIS — C61 Malignant neoplasm of prostate: Secondary | ICD-10-CM

## 2011-09-05 MED ORDER — FENTANYL 12 MCG/HR TD PT72: 1.0000 | MEDICATED_PATCH | TRANSDERMAL | Status: AC

## 2011-09-05 NOTE — Patient Instructions (Signed)
Drink as much fluid as you  can tolerate over the next few days  Stop Exforge and chlorthalidone  Return office visit 2 weeks

## 2011-09-05 NOTE — Progress Notes (Signed)
  Subjective:    Patient ID: [name] [name], male    DOB: 1930-05-13, 78 y.o.   MRN: [id number]  HPI  78 year old patient who is seen today post hospital discharge he was admitted for evaluation of chest pain. He has been under much stress due to his own poor health and the health of his wife who has advanced dementia. Since his discharge he has had no recurrent chest pain. Remains weak. He has retired and this has been a significant stressor as well. Hospital records reviewed.  He has a history of prostate cancer and his rectal and left leg pain has improved nicely. Remains on a fentanyl patch. There's been some fatigue and weakness.   Review of Systems  Constitutional: Positive for activity change, appetite change and fatigue. Negative for fever and chills.  HENT: Negative for hearing loss, ear pain, congestion, sore throat, trouble swallowing, neck stiffness, dental problem, voice change and tinnitus.   Eyes: Negative for pain, discharge and visual disturbance.  Respiratory: Negative for cough, chest tightness, wheezing and stridor.   Cardiovascular: Negative for chest pain, palpitations and leg swelling.  Gastrointestinal: Negative for nausea, vomiting, abdominal pain, diarrhea, constipation, blood in stool and abdominal distention.  Genitourinary: Negative for urgency, hematuria, flank pain, discharge, difficulty urinating and genital sores.  Musculoskeletal: Negative for myalgias, back pain, joint swelling, arthralgias and gait problem.  Skin: Negative for rash.  Neurological: Positive for weakness and numbness. Negative for dizziness, syncope, speech difficulty and headaches.  Hematological: Negative for adenopathy. Does not bruise/bleed easily.  Psychiatric/Behavioral: Negative for behavioral problems and dysphoric mood. The patient is not nervous/anxious.        Objective:   Physical Exam  Constitutional: He is oriented to person, place, and time. He appears well-developed.   Appears weak and slightly somnolent. Blood pressure 90/52  HENT:  Head: Normocephalic.  Right Ear: External ear normal.  Left Ear: External ear normal.  Eyes: Conjunctivae and EOM are normal.  Neck: Normal range of motion.  Cardiovascular: Normal rate and normal heart sounds.   Pulmonary/Chest: Effort normal and breath sounds normal.  Abdominal: Bowel sounds are normal.  Musculoskeletal: Normal range of motion. He exhibits no edema and no tenderness.  Neurological: He is alert and oriented to person, place, and time.  Psychiatric: He has a normal mood and affect. His behavior is normal.          Assessment & Plan:   Hypotension. Will discontinue diuretic therapy as well as Exforge We'll force fluids and recheck in 2 weeks Diabetes mellitus stable Coronary artery disease. No recurrent chest pain Peripheral neuropathy Chronic pain improved we'll down titrate fentanyl

## 2011-09-19 ENCOUNTER — Encounter: Payer: Self-pay | Admitting: Internal Medicine

## 2011-09-19 ENCOUNTER — Ambulatory Visit (INDEPENDENT_AMBULATORY_CARE_PROVIDER_SITE_OTHER): Payer: Medicare Other | Admitting: Internal Medicine

## 2011-09-19 VITALS — BP 128/74 | Temp 97.9°F | Wt 188.0 lb

## 2011-09-19 DIAGNOSIS — M549 Dorsalgia, unspecified: Secondary | ICD-10-CM

## 2011-09-19 DIAGNOSIS — E119 Type 2 diabetes mellitus without complications: Secondary | ICD-10-CM

## 2011-09-19 DIAGNOSIS — I10 Essential (primary) hypertension: Secondary | ICD-10-CM

## 2011-09-19 DIAGNOSIS — F329 Major depressive disorder, single episode, unspecified: Secondary | ICD-10-CM | POA: Insufficient documentation

## 2011-09-19 MED ORDER — SERTRALINE HCL 50 MG PO TABS: 50.0000 mg | ORAL_TABLET | Freq: Every day | ORAL | Status: DC

## 2011-09-19 NOTE — Progress Notes (Signed)
  Subjective:    Patient ID: [name] [name], male    DOB: 10/04/1929, 78 y.o.   MRN: [id number]  HPI 78 year old patient who is seen today for followup 2 weeks go he was quite weak and hypotensive and  exforge  And diuretic therapy were discontinued.  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  For the past several days she has had fairly intense right back pain with radiation down the right leg. The patient continues to have considerable situational stress and at times is quite tearful today due to his wife's poor health he has become more depressed. He is accompanied by his son today he wonders whether an antidepressant might be helpful. The patient continues to take analgesics for chronic pain He has type 2 diabetes that has been relatively stable. His last hemoglobin A1c 7.0    Review of Systems  Constitutional: Positive for fatigue.  Musculoskeletal: Positive for back pain and gait problem.  Psychiatric/Behavioral: Positive for dysphoric mood. The patient is nervous/anxious.        Objective:   Physical Exam  Constitutional: He is oriented to person, place, and time. He appears well-developed.       Blood pressure 130/80. Appears much more alert  HENT:  Head: Normocephalic.  Right Ear: External ear normal.  Left Ear: External ear normal.  Eyes: Conjunctivae and EOM are normal.  Neck: Normal range of motion.  Cardiovascular: Normal rate and normal heart sounds.   Pulmonary/Chest: Breath sounds normal.  Abdominal: Bowel sounds are normal.  Musculoskeletal: Normal range of motion. He exhibits no edema and no tenderness.       Positive straight leg test on the right  Neurological: He is alert and oriented to person, place, and time.  Psychiatric: He has a normal mood and affect. His behavior is normal.          Assessment & Plan:   Sciatica. Will treat with DUexis 1 tablet  3 times daily Diabetes mellitus stable Clinical  depression. We'll start sertraline 50 mg daily and recheck in 6 weeks Hypertension. Blood pressure much more stable we'll continue to hold antihypertensives  Recheck 6 weeks

## 2011-09-19 NOTE — Patient Instructions (Signed)
Most patients with low back pain will improve with time over the next two to 6 weeks.  Keep active but avoid any activities that cause pain.  Apply moist heat to the low back area several times daily.  DUEXIS one three times daily  Return in 6 weeks for followup

## 2011-09-20 ENCOUNTER — Telehealth: Payer: Self-pay | Admitting: Internal Medicine

## 2011-09-20 ENCOUNTER — Ambulatory Visit (INDEPENDENT_AMBULATORY_CARE_PROVIDER_SITE_OTHER): Payer: Medicare Other | Admitting: Internal Medicine

## 2011-09-20 DIAGNOSIS — M5431 Sciatica, right side: Secondary | ICD-10-CM

## 2011-09-20 DIAGNOSIS — M5430 Sciatica, unspecified side: Secondary | ICD-10-CM

## 2011-09-20 NOTE — Patient Instructions (Addendum)
Most patients with low back pain will improve with time over the next two to 6 weeks.  Keep active but avoid any activities that cause pain.  Apply moist heat to the low back area several times daily.  MINIMIZE activitiesSciatica Sciatica is a condition often seen in patients with disk disease of the lower back. Pressure on the sciatica nerve causes pain to radiate from the lower back or buttock down the leg.   CAUSES   It results from pressure on nerve roots coming out of the spine. This is often the result of a disc that deteriorates and pushes to one side. Often there is a history of back problems.   TREATMENT   In most cases sciatica improves greatly with conservative treatment (treatment that does not involve surgery). Most patients are completely better after 2-4 weeks of bed rest and other supportive care. Bed rest reduces the disc pressure greatly. Sitting is the worst position. When sitting pressure on the disc is over 5 times greater than it is while lying down. Avoid:  Bending.   Lifting.   All other activities which make the problem worse.  After the pain improves, you may continue with normal activity. Take brief periods for bed rest throughout the day until you are back to normal. Only take over-the-counter or prescription medicines for pain, discomfort, or fever as directed by your caregiver. Muscle relaxants may help by relieving spasm and providing mild sedation. Cold or heat therapy and massage may also give significant relief. Spinal manipulation is not recommended because it can increase the degree of disc protrusion. Traction can be used in severe cases. Surgery is reserved for patients who:  Do not improve within the first months of conservative treatment.   Have signs of severe nerve root pressure.  See your doctor for follow up care as recommended. A program for back injury rehabilitation with stretching and strengthening exercises is an important part of healing.   SEEK  MEDICAL CARE IF:    You notice increased pain.   You notice weakness.   You have numbness in your legs.   You have any difficulty with bladder or bowel control.  Document Released: 07/25/2004 Document Revised: 06/06/2011 Document Reviewed: 06/17/2005 [facility] Patient Information 2012 [place], [place].

## 2011-09-20 NOTE — Telephone Encounter (Signed)
ROV this afternoon for further evaluation and an injection of Depo-Medrol

## 2011-09-20 NOTE — Telephone Encounter (Signed)
Pt's pain is so bad that he cannot control it or remember what he is taking. He thinks he took 50 mg. Of  Nucynta 50 mg and is wearing his Duragesic patch this am.  Pain is unbearable.

## 2011-09-20 NOTE — Telephone Encounter (Signed)
Pt said that he is having a really bad flare up of sciatic nerve. Pt says that he can hardly function today and is req call back from nurse asap.

## 2011-09-20 NOTE — Telephone Encounter (Signed)
Please call/advise

## 2011-09-20 NOTE — Progress Notes (Signed)
  Subjective:    Patient ID: [name] [name], male    DOB: 1929/08/29, 78 y.o.   MRN: [id number]  HPI  78 year old patient who was seen yesterday with significant right-sided sciatica. 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 He slept poorly through the night and awoke quite sedated. He is accompanied by his son today. Due to diabetes Depo-Medrol was not prescribed yesterday. He does have analgesics available.    Review of Systems  Musculoskeletal: Positive for back pain.       Objective:   Physical Exam  Constitutional: He appears well-developed and well-nourished. No distress.       Comfortable land supine without pain. Slightly somnolent          Assessment & Plan:   Sciatica. Will treat with Depo-Medrol. Patient is aware that glycemic control may suffer of short-term. He was seen accompanied by his son and the dangers of accidental analgesic overdose discussed at length.

## 2011-09-20 NOTE — Telephone Encounter (Signed)
Spoke with [name] [name]- informed of dr. Vernon Prey instructions - placed on schedule for 4pm

## 2011-09-23 MED ORDER — METHYLPREDNISOLONE ACETATE 80 MG/ML IJ SUSP
80.0000 mg | Freq: Once | INTRAMUSCULAR | Status: AC
  Administered 2011-09-23: 80 mg via INTRAMUSCULAR

## 2011-09-23 NOTE — Progress Notes (Signed)
Addended by: Noralee Stain D on: 09/23/2011 10:57 AM   Modules accepted: Orders

## 2011-09-25 ENCOUNTER — Other Ambulatory Visit: Payer: Self-pay | Admitting: Orthopedic Surgery

## 2011-09-25 DIAGNOSIS — M542 Cervicalgia: Secondary | ICD-10-CM

## 2011-09-26 ENCOUNTER — Ambulatory Visit
Admission: RE | Admit: 2011-09-26 | Discharge: 2011-09-26 | Disposition: A | Discharge status: Home / Self Care | Payer: Medicare Other | Source: Ambulatory Visit | Attending: Orthopedic Surgery | Admitting: Orthopedic Surgery

## 2011-09-26 DIAGNOSIS — M542 Cervicalgia: Secondary | ICD-10-CM

## 2011-10-02 ENCOUNTER — Other Ambulatory Visit: Payer: Self-pay | Admitting: Orthopedic Surgery

## 2011-10-02 DIAGNOSIS — M545 Low back pain: Secondary | ICD-10-CM

## 2011-10-05 ENCOUNTER — Ambulatory Visit
Admission: RE | Admit: 2011-10-05 | Discharge: 2011-10-05 | Disposition: A | Discharge status: Home / Self Care | Payer: Medicare Other | Source: Ambulatory Visit | Attending: Orthopedic Surgery | Admitting: Orthopedic Surgery

## 2011-10-05 DIAGNOSIS — M545 Low back pain, unspecified: Secondary | ICD-10-CM

## 2011-10-06 ENCOUNTER — Other Ambulatory Visit: Payer: Medicare Other

## 2011-10-14 ENCOUNTER — Telehealth: Payer: Self-pay | Admitting: Internal Medicine

## 2011-10-14 NOTE — Telephone Encounter (Signed)
lmom at home #

## 2011-10-14 NOTE — Telephone Encounter (Signed)
Called and spoke with daughter and pt - nausea and vomiting  On off this AM - doing ok now - asked to check sugar - 268 If no improve to see tomorrow - asked him to check sugar before bed tonight , and fbs in AM - if sugars remain over 200 - call and we will see

## 2011-10-14 NOTE — Telephone Encounter (Signed)
Daughter is calling back because yesterday he was vomiting at 9 am.  This morning he was fine until about 10 am he again vomited and fell, but did not hurt himself.  She would like this added to her note.

## 2011-10-14 NOTE — Telephone Encounter (Signed)
rov tomarrow if concerns

## 2011-10-14 NOTE — Telephone Encounter (Signed)
Pt daughter would like kim to return her call.

## 2011-10-15 ENCOUNTER — Telehealth: Payer: Self-pay | Admitting: Internal Medicine

## 2011-10-15 NOTE — Telephone Encounter (Signed)
Pt called and said that he was returning call. Pt said that his bs was 224 11:30 pm last night and 6:30 am this morning bs was 137.

## 2011-10-15 NOTE — Telephone Encounter (Signed)
Spoke with pt- doing much better today - no nausea or vomiting today -progress diet slowly - continue checking sugars at least 1x daily - call if any problems - will see

## 2011-10-21 ENCOUNTER — Other Ambulatory Visit: Payer: Self-pay

## 2011-10-21 MED ORDER — METFORMIN HCL 500 MG PO TABS: 500.0000 mg | ORAL_TABLET | Freq: Two times a day (BID) | ORAL | Status: DC

## 2011-10-22 ENCOUNTER — Telehealth: Payer: Self-pay | Admitting: *Deleted

## 2011-10-22 NOTE — Telephone Encounter (Signed)
Attempt to call pt to confirm how he takes metformin - 500mg  - 2 qAM and 1 qPM ?

## 2011-10-22 NOTE — Telephone Encounter (Signed)
Please call the pharmacy re: questions on Metformin dosage?

## 2011-10-23 MED ORDER — METFORMIN HCL 1000 MG PO TABS: 1000.0000 mg | ORAL_TABLET | Freq: Two times a day (BID) | ORAL | Status: DC

## 2011-10-24 ENCOUNTER — Ambulatory Visit (INDEPENDENT_AMBULATORY_CARE_PROVIDER_SITE_OTHER): Payer: Medicare Other | Admitting: Internal Medicine

## 2011-10-24 ENCOUNTER — Telehealth: Payer: Self-pay | Admitting: Internal Medicine

## 2011-10-24 ENCOUNTER — Encounter: Payer: Self-pay | Admitting: Internal Medicine

## 2011-10-24 VITALS — BP 210/90 | Temp 97.5°F | Wt 181.0 lb

## 2011-10-24 DIAGNOSIS — I10 Essential (primary) hypertension: Secondary | ICD-10-CM

## 2011-10-24 NOTE — Telephone Encounter (Signed)
noted 

## 2011-10-24 NOTE — Progress Notes (Signed)
  Subjective:    Patient ID: [name] [name], male    DOB: January 15, 1930, 78 y.o.   MRN: [id number]  HPI  78 year old patient who is seen today for followup. He was seen in early March and noted to be weak and hypotensive at that time his blood pressure medications were held and when he was seen in followup 2 weeks later blood pressure was still in a normal range. He was seen by orthopedics earlier today and was scheduled for a epidural for a right L5-S1 herniated disc he was noted be quite hypertensive and was referred to the office for evaluation. He has been under considerable situational stress due to the poor health of his wife who is terminal. Comfort measures only are being applied  BP Readings from Last 3 Encounters:  10/24/11 210/90  09/19/11 128/74  09/05/11 90/52    Review of Systems  Psychiatric/Behavioral: Positive for dysphoric mood. The patient is nervous/anxious.        Objective:   Physical Exam  Constitutional: He is oriented to person, place, and time. He appears well-developed.       Blood pressure 200/80 in both arms supine and sitting  HENT:  Head: Normocephalic.  Right Ear: External ear normal.  Left Ear: External ear normal.  Eyes: Conjunctivae and EOM are normal.  Neck: Normal range of motion.  Cardiovascular: Normal rate and normal heart sounds.   Pulmonary/Chest: Breath sounds normal. No respiratory distress. He has no wheezes. He has no rales.  Abdominal: Bowel sounds are normal.  Musculoskeletal: Normal range of motion. He exhibits no edema and no tenderness.  Neurological: He is alert and oriented to person, place, and time.  Psychiatric: He has a normal mood and affect. His behavior is normal.          Assessment & Plan:   Hypertension. Poor control the patient was given Catapres 0.1 mg in the office he will be resumed on exploration he'll call the office tomorrow for further instructions. May consider dose titration

## 2011-10-24 NOTE — Telephone Encounter (Signed)
Pt is coming in for a bp check per Dr Amador Cunas today at 4:30pm.

## 2011-10-24 NOTE — Telephone Encounter (Signed)
Pt went to [facility] to get a steroid inj in his back, but his bp was 217/91 and 206/77.  Pt is req call back from nurse asap.

## 2011-10-24 NOTE — Patient Instructions (Signed)
Call the office in the morning for further instructions about your blood pressure medications

## 2011-10-25 ENCOUNTER — Telehealth: Payer: Self-pay | Admitting: Internal Medicine

## 2011-10-25 NOTE — Telephone Encounter (Signed)
Pt called and said that his blood pressure this morning is lft 187/90 pulse rate 52  Rt arm 192/99 and pulse rt 53 at 7:19 am.

## 2011-10-25 NOTE — Telephone Encounter (Signed)
Please advise 

## 2011-10-28 ENCOUNTER — Ambulatory Visit: Payer: Medicare Other | Admitting: Cardiology

## 2011-10-28 MED ORDER — METFORMIN HCL 1000 MG PO TABS: 1000.0000 mg | ORAL_TABLET | Freq: Two times a day (BID) | ORAL | Status: DC

## 2011-10-28 MED ORDER — DONEPEZIL HCL 10 MG PO TABS: 10.0000 mg | ORAL_TABLET | Freq: Every day | ORAL | Status: DC

## 2011-10-28 MED ORDER — AMLODIPINE BESYLATE-VALSARTAN 10-320 MG PO TABS: 1.0000 | ORAL_TABLET | Freq: Every day | ORAL | Status: DC

## 2011-10-28 NOTE — Telephone Encounter (Signed)
exforge 10/320  One daily  #90 -  also please refill the patient's Aricept and metformin

## 2011-10-28 NOTE — Telephone Encounter (Signed)
done

## 2011-10-31 ENCOUNTER — Other Ambulatory Visit: Payer: Self-pay | Admitting: Internal Medicine

## 2011-10-31 ENCOUNTER — Ambulatory Visit: Payer: Medicare Other | Admitting: Internal Medicine

## 2011-10-31 ENCOUNTER — Telehealth (INDEPENDENT_AMBULATORY_CARE_PROVIDER_SITE_OTHER): Payer: Self-pay | Admitting: Surgery

## 2011-11-14 ENCOUNTER — Telehealth: Payer: Self-pay | Admitting: Internal Medicine

## 2011-11-14 ENCOUNTER — Other Ambulatory Visit: Payer: Self-pay | Admitting: Orthopedic Surgery

## 2011-11-14 DIAGNOSIS — R29898 Other symptoms and signs involving the musculoskeletal system: Secondary | ICD-10-CM

## 2011-11-14 NOTE — Telephone Encounter (Signed)
Opened in error

## 2011-11-21 ENCOUNTER — Ambulatory Visit
Admission: RE | Admit: 2011-11-21 | Discharge: 2011-11-21 | Disposition: A | Discharge status: Home / Self Care | Payer: Medicare Other | Source: Ambulatory Visit | Attending: Orthopedic Surgery | Admitting: Orthopedic Surgery

## 2011-11-21 DIAGNOSIS — R29898 Other symptoms and signs involving the musculoskeletal system: Secondary | ICD-10-CM

## 2011-12-05 ENCOUNTER — Other Ambulatory Visit: Payer: Self-pay | Admitting: Urology

## 2011-12-05 DIAGNOSIS — C61 Malignant neoplasm of prostate: Secondary | ICD-10-CM

## 2011-12-11 ENCOUNTER — Ambulatory Visit: Payer: Medicare Other | Admitting: Physical Therapy

## 2011-12-13 ENCOUNTER — Telehealth: Payer: Self-pay | Admitting: Internal Medicine

## 2011-12-13 NOTE — Telephone Encounter (Signed)
Please call.

## 2011-12-13 NOTE — Telephone Encounter (Signed)
Called and discussed

## 2011-12-13 NOTE — Telephone Encounter (Signed)
Pt daughter is concerned because pt is no longer using his pain patch. Daughter [place] concerned it might have side effects. Please contact

## 2011-12-19 ENCOUNTER — Ambulatory Visit: Payer: Medicare Other | Attending: Orthopedic Surgery | Admitting: Physical Therapy

## 2011-12-19 DIAGNOSIS — R269 Unspecified abnormalities of gait and mobility: Secondary | ICD-10-CM | POA: Insufficient documentation

## 2011-12-19 DIAGNOSIS — IMO0001 Reserved for inherently not codable concepts without codable children: Secondary | ICD-10-CM | POA: Insufficient documentation

## 2011-12-19 DIAGNOSIS — M6281 Muscle weakness (generalized): Secondary | ICD-10-CM | POA: Insufficient documentation

## 2011-12-20 ENCOUNTER — Ambulatory Visit (HOSPITAL_COMMUNITY): Payer: Medicare Other

## 2011-12-20 ENCOUNTER — Encounter (HOSPITAL_COMMUNITY)
Admission: RE | Admit: 2011-12-20 | Discharge: 2011-12-20 | Disposition: A | Discharge status: Home / Self Care | Payer: Medicare Other | Source: Ambulatory Visit | Attending: Urology | Admitting: Urology

## 2011-12-20 ENCOUNTER — Encounter (HOSPITAL_COMMUNITY): Payer: Self-pay

## 2011-12-20 DIAGNOSIS — M79609 Pain in unspecified limb: Secondary | ICD-10-CM | POA: Insufficient documentation

## 2011-12-20 DIAGNOSIS — M25559 Pain in unspecified hip: Secondary | ICD-10-CM | POA: Insufficient documentation

## 2011-12-20 DIAGNOSIS — C61 Malignant neoplasm of prostate: Secondary | ICD-10-CM | POA: Insufficient documentation

## 2011-12-20 MED ORDER — TECHNETIUM TC 99M MEDRONATE IV KIT
24.0000 | PACK | Freq: Once | INTRAVENOUS | Status: AC | PRN
  Administered 2011-12-20: 24 via INTRAVENOUS

## 2011-12-23 ENCOUNTER — Ambulatory Visit: Payer: Medicare Other | Admitting: Physical Therapy

## 2011-12-28 ENCOUNTER — Other Ambulatory Visit: Payer: Self-pay | Admitting: Internal Medicine

## 2011-12-31 ENCOUNTER — Encounter: Payer: Self-pay | Admitting: Internal Medicine

## 2011-12-31 ENCOUNTER — Ambulatory Visit (INDEPENDENT_AMBULATORY_CARE_PROVIDER_SITE_OTHER): Payer: Medicare Other | Admitting: Internal Medicine

## 2011-12-31 VITALS — BP 126/80 | Ht 67.0 in | Wt 190.0 lb

## 2011-12-31 DIAGNOSIS — E1142 Type 2 diabetes mellitus with diabetic polyneuropathy: Secondary | ICD-10-CM

## 2011-12-31 DIAGNOSIS — I2510 Atherosclerotic heart disease of native coronary artery without angina pectoris: Secondary | ICD-10-CM

## 2011-12-31 DIAGNOSIS — I10 Essential (primary) hypertension: Secondary | ICD-10-CM

## 2011-12-31 DIAGNOSIS — E1149 Type 2 diabetes mellitus with other diabetic neurological complication: Secondary | ICD-10-CM

## 2011-12-31 DIAGNOSIS — E119 Type 2 diabetes mellitus without complications: Secondary | ICD-10-CM

## 2011-12-31 DIAGNOSIS — D214 Benign neoplasm of connective and other soft tissue of abdomen: Secondary | ICD-10-CM

## 2011-12-31 HISTORY — DX: Benign neoplasm of connective and other soft tissue of abdomen: D21.4

## 2011-12-31 LAB — HEMOGLOBIN A1C: Hgb A1c MFr Bld: 7.6 % — ABNORMAL HIGH (ref 4.6–6.5)

## 2011-12-31 MED ORDER — METFORMIN HCL 1000 MG PO TABS: ORAL_TABLET | ORAL | Status: DC

## 2011-12-31 MED ORDER — PREGABALIN 50 MG PO CAPS: 50.0000 mg | ORAL_CAPSULE | Freq: Two times a day (BID) | ORAL | Status: DC

## 2011-12-31 NOTE — Progress Notes (Signed)
Subjective:    Patient ID: [name] [name], male    DOB: 1930/01/21, 78 y.o.   MRN: [id number]  HPI  78 year old patient who is seen today in followup for multiple medical problems he has type 2 diabetes which has been controlled with oral agents. More recently he has seen some elevated blood sugars. He is followed closely by urology do to locally metastatic prostate cancer. He has had significant left leg pain that has resolved with treatment of metastatic prostate cancer. More recently he has had right-sided sciatica that has also resolved following 2 epidurals. He has coronary artery disease and hypertension which has been stable. 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 Michaell Cowing but this was deferred due to to his unstable clinical course. He initially declined surgery but now wishes to reconsider.  Past Medical History  Diagnosis Date  . Hypertension   . Diabetes mellitus   . Hyperlipidemia   . Memory loss     mild  . Rectal ulcer   . CAD (coronary artery disease)     S/P CABG x 5 1998; S/P stent LCX 2009  . Constipation   . History of ventricular fibrillation 1998    while on treadmill  . Hypothyroidism   . Prostate cancer   . IBS (irritable bowel syndrome)   . Colitis   . ED (erectile dysfunction)   . OA (osteoarthritis)     History   Social History  . Marital Status: Widowed    Spouse Name: N/A    Number of Children: N/A  . Years of Education: N/A   Occupational History  . Not on file.   Social History Main Topics  . Smoking status: Never Smoker   . Smokeless tobacco: Never Used  . Alcohol Use: No  . Drug Use: No  . Sexually Active: Not on file   Other Topics Concern  . Not on file   Social History Narrative  . No narrative on file    Past Surgical History  Procedure Date  . Coronary artery bypass graft 1998    x5, with a left internal mammary to the LAD and diagonal, vein graft  to the OM1 and 2, and a vein graft to the right coronary artery  . Coronary stent placement 11/13/2007    LCX    Family History  Problem Relation Age of Onset  . Stroke Father   . Pneumonia Father   . Colon cancer Neg Hx     Allergies  Allergen Reactions  . Codeine Phosphate     REACTION: unspecified    Current Outpatient Prescriptions on File Prior to Visit  Medication Sig Dispense Refill  . ACCU-CHEK COMPACT TEST DRUM test strip TEST BLOOD SUGAR ONCE DAILY AND AS NEEDED  102 each  6  . amLODipine-valsartan (EXFORGE) 10-320 MG per tablet Take 1 tablet by mouth daily.  90 tablet  3  . aspirin 81 MG tablet Take 81 mg by mouth daily.        [name] atorvastatin (LIPITOR) 10 MG tablet Take 10 mg by mouth daily.      . Cholecalciferol (VITAMIN D) 1000 UNITS capsule Take 1,000 Units by mouth daily.        . Denosumab (XGEVA [place]) Inject into the skin.      [name] donepezil (ARICEPT) 10 MG tablet Take 1 tablet (10 mg total) by mouth daily.  90 tablet  3  .  fish oil-omega-3 fatty acids 1000 MG capsule Take 1 g by mouth daily.       [name] glimepiride (AMARYL) 4 MG tablet TAKE 1/2 TABLET BY MOUTH EVERY MORNING  90 tablet  2  . levothyroxine (SYNTHROID, LEVOTHROID) 50 MCG tablet Take 50 mcg by mouth daily.      . Multiple Vitamin (MULTIVITAMIN) tablet Take 1 tablet by mouth daily.        . nitroGLYCERIN (NITROSTAT) 0.4 MG SL tablet Place 0.4 mg under the tongue every 5 (five) minutes as needed. For chest pain      . sertraline (ZOLOFT) 50 MG tablet Take 1 tablet (50 mg total) by mouth daily.  90 tablet  2  . tapentadol (NUCYNTA) 50 MG TABS Take 50 mg by mouth every 6 (six) hours as needed. For pain      . DISCONTD: pregabalin (LYRICA) 50 MG capsule Take 50 mg by mouth 2 (two) times daily.       [name] DISCONTD: chlorthalidone (HYGROTON) 25 MG tablet Take 12.5 mg by mouth daily.        BP 126/80  Ht 5\' 7"  (1.702 m)  Wt 190 lb (86.183 kg)  BMI 29.76 kg/m2       Review of Systems  Constitutional:  Positive for fatigue. Negative for fever, chills and appetite change.  HENT: Negative for hearing loss, ear pain, congestion, sore throat, trouble swallowing, neck stiffness, dental problem, voice change and tinnitus.   Eyes: Negative for pain, discharge and visual disturbance.  Respiratory: Negative for cough, chest tightness, wheezing and stridor.   Cardiovascular: Negative for chest pain, palpitations and leg swelling.  Gastrointestinal: Negative for nausea, vomiting, abdominal pain, diarrhea, constipation, blood in stool and abdominal distention.  Genitourinary: Negative for urgency, hematuria, flank pain, discharge, difficulty urinating and genital sores.  Musculoskeletal: Negative for myalgias, back pain, joint swelling, arthralgias and gait problem.  Skin: Negative for rash.  Neurological: Positive for weakness, light-headedness and numbness. Negative for dizziness, syncope, speech difficulty and headaches.  Hematological: Negative for adenopathy. Does not bruise/bleed easily.  Psychiatric/Behavioral: Positive for confusion. Negative for behavioral problems and dysphoric mood. The patient is not nervous/anxious.        Objective:   Physical Exam  Constitutional: He is oriented to person, place, and time. He appears well-developed.  HENT:  Head: Normocephalic.  Right Ear: External ear normal.  Left Ear: External ear normal.  Eyes: Conjunctivae and EOM are normal.  Neck: Normal range of motion.  Cardiovascular: Normal rate and normal heart sounds.   Pulmonary/Chest: Breath sounds normal.  Abdominal: Bowel sounds are normal.  Musculoskeletal: Normal range of motion. He exhibits no edema and no tenderness.  Neurological: He is alert and oriented to person, place, and time.       Decreased vibratory sensation of the feet  Psychiatric: He has a normal mood and affect. His behavior is normal.          Assessment & Plan:   Diabetes mellitus. We'll check a hemoglobin  A1c Hypertension stable Coronary artery disease Prostate cancer. Improved GIST  we'll set up for general surgical evaluation. Patient is now agreeable

## 2011-12-31 NOTE — Patient Instructions (Addendum)
Limit your sodium (Salt) intake    It is important that you exercise regularly, at least 20 minutes 3 to 4 times per week.  If you develop chest pain or shortness of breath seek  medical attention.   Please check your hemoglobin A1c every 3 months  General surgical evaluation as discussed  Cardiology followup

## 2012-01-01 ENCOUNTER — Telehealth: Payer: Self-pay | Admitting: Internal Medicine

## 2012-01-01 ENCOUNTER — Other Ambulatory Visit: Payer: Self-pay | Admitting: Internal Medicine

## 2012-01-01 DIAGNOSIS — Z8509 Personal history of malignant neoplasm of other digestive organs: Secondary | ICD-10-CM

## 2012-01-01 MED ORDER — GLIMEPIRIDE 4 MG PO TABS: 4.0000 mg | ORAL_TABLET | Freq: Every day | ORAL | Status: DC

## 2012-01-01 NOTE — Telephone Encounter (Signed)
Pt needs amaryl call into [facility] [phone].

## 2012-01-06 ENCOUNTER — Other Ambulatory Visit: Payer: Self-pay

## 2012-01-06 MED ORDER — GLIMEPIRIDE 4 MG PO TABS: 4.0000 mg | ORAL_TABLET | Freq: Every day | ORAL | Status: DC

## 2012-01-07 ENCOUNTER — Ambulatory Visit: Payer: Medicare Other | Attending: Orthopedic Surgery | Admitting: Physical Therapy

## 2012-01-07 DIAGNOSIS — IMO0001 Reserved for inherently not codable concepts without codable children: Secondary | ICD-10-CM | POA: Insufficient documentation

## 2012-01-07 DIAGNOSIS — R269 Unspecified abnormalities of gait and mobility: Secondary | ICD-10-CM | POA: Insufficient documentation

## 2012-01-07 DIAGNOSIS — M6281 Muscle weakness (generalized): Secondary | ICD-10-CM | POA: Insufficient documentation

## 2012-01-09 ENCOUNTER — Ambulatory Visit: Payer: Medicare Other | Admitting: Physical Therapy

## 2012-01-14 ENCOUNTER — Ambulatory Visit: Payer: Medicare Other | Admitting: Physical Therapy

## 2012-01-15 ENCOUNTER — Telehealth: Payer: Self-pay | Admitting: Internal Medicine

## 2012-01-15 DIAGNOSIS — H9190 Unspecified hearing loss, unspecified ear: Secondary | ICD-10-CM

## 2012-01-15 NOTE — Telephone Encounter (Signed)
Pt requesting a referral for a hearing test  At [facility] on [address]  Fax # [phone]

## 2012-01-16 ENCOUNTER — Ambulatory Visit: Payer: Medicare Other | Admitting: Physical Therapy

## 2012-01-16 NOTE — Telephone Encounter (Signed)
Referral order placed.

## 2012-01-17 ENCOUNTER — Other Ambulatory Visit: Payer: Self-pay | Admitting: Internal Medicine

## 2012-01-21 ENCOUNTER — Encounter: Payer: Medicare Other | Admitting: Physical Therapy

## 2012-01-23 ENCOUNTER — Encounter: Payer: Medicare Other | Admitting: Physical Therapy

## 2012-01-23 ENCOUNTER — Ambulatory Visit: Payer: Medicare Other | Admitting: Physical Therapy

## 2012-01-27 ENCOUNTER — Ambulatory Visit: Payer: Medicare Other | Admitting: *Deleted

## 2012-02-04 ENCOUNTER — Ambulatory Visit: Payer: Medicare Other | Admitting: Physical Therapy

## 2012-02-05 ENCOUNTER — Ambulatory Visit: Payer: Medicare Other | Attending: Orthopedic Surgery | Admitting: Physical Therapy

## 2012-02-05 DIAGNOSIS — IMO0001 Reserved for inherently not codable concepts without codable children: Secondary | ICD-10-CM | POA: Insufficient documentation

## 2012-02-05 DIAGNOSIS — R269 Unspecified abnormalities of gait and mobility: Secondary | ICD-10-CM | POA: Insufficient documentation

## 2012-02-05 DIAGNOSIS — M6281 Muscle weakness (generalized): Secondary | ICD-10-CM | POA: Insufficient documentation

## 2012-02-07 ENCOUNTER — Ambulatory Visit: Payer: Medicare Other | Admitting: Physical Therapy

## 2012-02-10 ENCOUNTER — Ambulatory Visit: Payer: Medicare Other | Admitting: Physical Therapy

## 2012-02-11 ENCOUNTER — Ambulatory Visit (INDEPENDENT_AMBULATORY_CARE_PROVIDER_SITE_OTHER): Payer: Medicare Other | Admitting: Surgery

## 2012-02-11 ENCOUNTER — Encounter (INDEPENDENT_AMBULATORY_CARE_PROVIDER_SITE_OTHER): Payer: Self-pay | Admitting: Surgery

## 2012-02-11 ENCOUNTER — Other Ambulatory Visit (INDEPENDENT_AMBULATORY_CARE_PROVIDER_SITE_OTHER): Payer: Self-pay | Admitting: Surgery

## 2012-02-11 VITALS — BP 130/74 | HR 52 | Temp 98.0°F | Ht 67.0 in | Wt 187.8 lb

## 2012-02-11 DIAGNOSIS — D214 Benign neoplasm of connective and other soft tissue of abdomen: Secondary | ICD-10-CM

## 2012-02-11 NOTE — Progress Notes (Signed)
General Surgery [facility]  Chief Complaint  Patient presents with  . New Evaluation    eval GIST tumor of stomach - referral from Dr. Christella Hartigan, [facility]; primary care Dr. Eleonore Chiquito    HISTORY: The patient is a 78 year old white male known to my practice from previous evaluation for bilateral thyroid nodules. The patient was diagnosed in March 2012 with a gastrointestinal stromal tumor involving the upper posterior stomach wall. Followup CT scan in July 2012 showed minimal change in this 3.8 cm tumor. Patient underwent upper endoscopy with biopsy confirming a spindle cell tumor consistent with GIST.  Endoscopic ultrasound examination showed the tumor to measure approximately 3.8 cm. Patient has had no further studies since August 2012 in relation to this tumor.  The patient remains asymptomatic. He has had no obstructive symptoms. He has had no evidence of GI bleeding. Patient has had no prior upper abdominal surgery.  Past Medical History  Diagnosis Date  . Hypertension   . Diabetes mellitus   . Hyperlipidemia   . Memory loss     mild  . Rectal ulcer   . CAD (coronary artery disease)     S/P CABG x 5 1998; S/P stent LCX 2009  . Constipation   . History of ventricular fibrillation 1998    while on treadmill  . Hypothyroidism   . Prostate cancer   . IBS (irritable bowel syndrome)   . Colitis   . ED (erectile dysfunction)   . OA (osteoarthritis)   . Neuromuscular disorder   . Osteoporosis   . Heart attack      Current Outpatient Prescriptions  Medication Sig Dispense Refill  . ACCU-CHEK COMPACT TEST DRUM test strip TEST BLOOD SUGAR ONCE DAILY AND AS NEEDED  102 each  6  . amLODipine-valsartan (EXFORGE) 10-320 MG per tablet Take 1 tablet by mouth daily.  90 tablet  3  . aspirin 81 MG tablet Take 81 mg by mouth daily.        [name] atorvastatin (LIPITOR) 10 MG tablet TAKE 1 TABLET BY MOUTH EVERY DAY  90 tablet  3  . Calcium Citrate (CITRACAL PO) Take  by mouth.      . Denosumab (XGEVA [place]) Inject into the skin.      [name] donepezil (ARICEPT) 10 MG tablet Take 1 tablet (10 mg total) by mouth daily.  90 tablet  3  . fish oil-omega-3 fatty acids 1000 MG capsule Take 1 g by mouth 2 (two) times a week.       [name] glimepiride (AMARYL) 4 MG tablet Take 1 tablet (4 mg total) by mouth daily before breakfast.  90 tablet  1  . levothyroxine (SYNTHROID, LEVOTHROID) 50 MCG tablet Take 50 mcg by mouth daily.      . metFORMIN (GLUCOPHAGE) 1000 MG tablet 1000 mg in the morning and 500 milligrams in the evening  180 tablet  3  . Multiple Vitamin (MULTIVITAMIN) tablet Take 1 tablet by mouth daily.        . nitroGLYCERIN (NITROSTAT) 0.4 MG SL tablet Place 0.4 mg under the tongue every 5 (five) minutes as needed. For chest pain      . pregabalin (LYRICA) 50 MG capsule Take 1 capsule (50 mg total) by mouth 2 (two) times daily.  180 capsule  4  . sertraline (ZOLOFT) 50 MG tablet Take 1 tablet (50 mg total) by mouth daily.  90 tablet  2  . tapentadol (NUCYNTA) 50 MG TABS Take 50 mg by  mouth every 6 (six) hours as needed. For pain      . DISCONTD: chlorthalidone (HYGROTON) 25 MG tablet Take 12.5 mg by mouth daily.         Allergies  Allergen Reactions  . Codeine Phosphate     REACTION: unspecified     Family History  Problem Relation Age of Onset  . Stroke Father   . Pneumonia Father   . Colon cancer Neg Hx      History   Social History  . Marital Status: Widowed    Spouse Name: N/A    Number of Children: N/A  . Years of Education: N/A   Social History Main Topics  . Smoking status: Former Games developer  . Smokeless tobacco: Former Neurosurgeon    Quit date: 02/11/1948  . Alcohol Use: No  . Drug Use: No  . Sexually Active: None   Other Topics Concern  . None   Social History Narrative  . None     REVIEW OF SYSTEMS - PERTINENT POSITIVES ONLY: Denies abdominal pain. Denies obstructive symptoms. No signs or symptoms of gastrointestinal  bleeding.  EXAM: Filed Vitals:   02/11/12 1430  BP: 130/74  Pulse: 52  Temp: 98 F (36.7 C)    HEENT: normocephalic; pupils equal and reactive; sclerae clear; dentition good; mucous membranes moist NECK:  symmetric on extension; no palpable anterior or posterior cervical lymphadenopathy; no supraclavicular masses; no tenderness CHEST: clear to auscultation bilaterally without rales, rhonchi, or wheezes CARDIAC: regular rate and rhythm without significant murmur; peripheral pulses are full ABDOMEN: soft without distension; bowel sounds present; no mass; no hepatosplenomegaly; no hernia EXT:  non-tender without edema; no deformity NEURO: no gross focal deficits; no sign of tremor   LABORATORY RESULTS: See [facility] ([facility]L-Epic) for most recent results   RADIOLOGY RESULTS: See [facility] (CHL-Epic) for most recent results   IMPRESSION: Gastrointestinal stromal tumor, 3.8 cm, posterior proximal gastric wall  PLAN: I discussed the above findings and issues today with the patient and his daughter. He has had no further imaging in the past year. I would like to obtain a CT scan of the abdomen to see if there is been any significant interval change.  We discussed gastrointestinal stromal tumors and there surgical management. We discussed the possibility of obstructive symptoms and of gastrointestinal bleeding. We discussed surgical intervention including open surgery and laparoscopic surgery. At this point, if the patient remains asymptomatic, he is not particularly interested in proceeding with surgery without good cause.  We will obtain the CT scan of the abdomen and pelvis. I will review those results with my partners. We will be in contact with the patient and his physicians regarding further recommendations at that time.  Velora Heckler, MD, FACS General & Endocrine Surgery [facility]   Visit Diagnoses: 1. GIST (gastrointestinal stromal tumor),  non-malignant     Primary Care Physician: Rogelia Boga, MD

## 2012-02-11 NOTE — Patient Instructions (Signed)
Will review CT abdomen when available with Dr. Estelle Grumbles in my practice.  Will call with results.  Velora Heckler, MD, [facility] Office: [phone]

## 2012-02-12 LAB — CREATININE, SERUM: Creat: 1.38 mg/dL — ABNORMAL HIGH (ref 0.50–1.35)

## 2012-02-13 ENCOUNTER — Ambulatory Visit: Payer: Medicare Other | Admitting: Physical Therapy

## 2012-02-14 ENCOUNTER — Inpatient Hospital Stay: Admission: RE | Admit: 2012-02-14 | Payer: Medicare Other | Source: Ambulatory Visit

## 2012-02-18 ENCOUNTER — Ambulatory Visit: Payer: Medicare Other | Admitting: Physical Therapy

## 2012-02-20 ENCOUNTER — Ambulatory Visit: Payer: Medicare Other | Admitting: Physical Therapy

## 2012-02-24 ENCOUNTER — Ambulatory Visit
Admission: RE | Admit: 2012-02-24 | Discharge: 2012-02-24 | Disposition: A | Discharge status: Home / Self Care | Payer: Medicare Other | Source: Ambulatory Visit | Attending: Surgery | Admitting: Surgery

## 2012-02-24 DIAGNOSIS — D214 Benign neoplasm of connective and other soft tissue of abdomen: Secondary | ICD-10-CM

## 2012-02-24 MED ORDER — IOHEXOL 300 MG/ML  SOLN
100.0000 mL | Freq: Once | INTRAMUSCULAR | Status: AC | PRN
  Administered 2012-02-24: 100 mL via INTRAVENOUS

## 2012-02-25 ENCOUNTER — Ambulatory Visit: Payer: Medicare Other | Admitting: Physical Therapy

## 2012-02-26 ENCOUNTER — Telehealth (INDEPENDENT_AMBULATORY_CARE_PROVIDER_SITE_OTHER): Payer: Self-pay

## 2012-02-26 NOTE — Telephone Encounter (Signed)
Ct to Dr Gerrit Friends to review and advise f/u.

## 2012-02-27 ENCOUNTER — Ambulatory Visit: Payer: Medicare Other | Admitting: Physical Therapy

## 2012-03-03 ENCOUNTER — Ambulatory Visit (INDEPENDENT_AMBULATORY_CARE_PROVIDER_SITE_OTHER): Payer: Medicare Other | Admitting: Internal Medicine

## 2012-03-03 ENCOUNTER — Encounter: Payer: Self-pay | Admitting: Internal Medicine

## 2012-03-03 ENCOUNTER — Ambulatory Visit: Payer: Medicare Other | Admitting: Internal Medicine

## 2012-03-03 VITALS — BP 114/70 | Temp 98.2°F | Wt 192.0 lb

## 2012-03-03 DIAGNOSIS — E119 Type 2 diabetes mellitus without complications: Secondary | ICD-10-CM

## 2012-03-03 DIAGNOSIS — I10 Essential (primary) hypertension: Secondary | ICD-10-CM

## 2012-03-03 DIAGNOSIS — D214 Benign neoplasm of connective and other soft tissue of abdomen: Secondary | ICD-10-CM

## 2012-03-03 DIAGNOSIS — E785 Hyperlipidemia, unspecified: Secondary | ICD-10-CM

## 2012-03-03 DIAGNOSIS — I2510 Atherosclerotic heart disease of native coronary artery without angina pectoris: Secondary | ICD-10-CM

## 2012-03-03 DIAGNOSIS — Z8546 Personal history of malignant neoplasm of prostate: Secondary | ICD-10-CM

## 2012-03-03 NOTE — Patient Instructions (Addendum)
It is important that you exercise regularly, at least 20 minutes 3 to 4 times per week.  If you develop chest pain or shortness of breath seek  medical attention.   Please check your hemoglobin A1c every 3 months Constipation in Adults Constipation is having fewer than 2 bowel movements per week. Usually, the stools are hard. As we grow older, constipation is more common. If you try to fix constipation with laxatives, the problem may get worse. This is because laxatives taken over a long period of time make the colon muscles weaker. A low-fiber diet, not taking in enough fluids, and taking some medicines may make these problems worse. MEDICATIONS THAT MAY CAUSE CONSTIPATION  Water pills (diuretics).   Calcium channel blockers (used to control blood pressure and for the heart).   Certain pain medicines (narcotics).   Anticholinergics.   Anti-inflammatory agents.   Antacids that contain aluminum.  DISEASES THAT CONTRIBUTE TO CONSTIPATION  Diabetes.   Parkinson's disease.   Dementia.   Stroke.   Depression.   Illnesses that cause problems with salt and water metabolism.  HOME CARE INSTRUCTIONS    Constipation is usually best cared for without medicines. Increasing dietary fiber and eating more fruits and vegetables is the best way to manage constipation.   Slowly increase fiber intake to 25 to 38 grams per day. Whole grains, fruits, vegetables, and legumes are good sources of fiber. A dietitian can further help you incorporate high-fiber foods into your diet.   Drink enough water and fluids to keep your urine clear or pale yellow.   A fiber supplement may be added to your diet if you cannot get enough fiber from foods.   Increasing your activities also helps improve regularity.   Suppositories, as suggested by your caregiver, will also help. If you are using antacids, such as aluminum or calcium containing products, it will be helpful to switch to products containing  magnesium if your caregiver says it is okay.   If you have been given a liquid injection (enema) today, this is only a temporary measure. It should not be relied on for treatment of longstanding (chronic) constipation.   Stronger measures, such as magnesium sulfate, should be avoided if possible. This may cause uncontrollable diarrhea. Using magnesium sulfate may not allow you time to make it to the bathroom.  SEEK IMMEDIATE MEDICAL CARE IF:    There is bright red blood in the stool.   The constipation stays for more than 4 days.   There is belly (abdominal) or rectal pain.   You do not seem to be getting better.   You have any questions or concerns.  MAKE SURE YOU:    Understand these instructions.   Will watch your condition.   Will get help right away if you are not doing well or get worse.  Document Released: 03/15/2004 Document Revised: 06/06/2011 Document Reviewed: 05/21/2011 [facility] Patient Information 2012 [place], [place].Constipation in Adults Constipation is having fewer than 2 bowel movements per week. Usually, the stools are hard. As we grow older, constipation is more common. If you try to fix constipation with laxatives, the problem may get worse. This is because laxatives taken over a long period of time make the colon muscles weaker. A low-fiber diet, not taking in enough fluids, and taking some medicines may make these problems worse. MEDICATIONS THAT MAY CAUSE CONSTIPATION  Water pills (diuretics).   Calcium channel blockers (used to control blood pressure and for the heart).   Certain pain  medicines (narcotics).   Anticholinergics.   Anti-inflammatory agents.   Antacids that contain aluminum.  DISEASES THAT CONTRIBUTE TO CONSTIPATION  Diabetes.   Parkinson's disease.   Dementia.   Stroke.   Depression.   Illnesses that cause problems with salt and water metabolism.  HOME CARE INSTRUCTIONS    Constipation is usually best cared for without  medicines. Increasing dietary fiber and eating more fruits and vegetables is the best way to manage constipation.   Slowly increase fiber intake to 25 to 38 grams per day. Whole grains, fruits, vegetables, and legumes are good sources of fiber. A dietitian can further help you incorporate high-fiber foods into your diet.   Drink enough water and fluids to keep your urine clear or pale yellow.   A fiber supplement may be added to your diet if you cannot get enough fiber from foods.   Increasing your activities also helps improve regularity.   Suppositories, as suggested by your caregiver, will also help. If you are using antacids, such as aluminum or calcium containing products, it will be helpful to switch to products containing magnesium if your caregiver says it is okay.   If you have been given a liquid injection (enema) today, this is only a temporary measure. It should not be relied on for treatment of longstanding (chronic) constipation.   Stronger measures, such as magnesium sulfate, should be avoided if possible. This may cause uncontrollable diarrhea. Using magnesium sulfate may not allow you time to make it to the bathroom.  SEEK IMMEDIATE MEDICAL CARE IF:    There is bright red blood in the stool.   The constipation stays for more than 4 days.   There is belly (abdominal) or rectal pain.   You do not seem to be getting better.   You have any questions or concerns.  MAKE SURE YOU:    Understand these instructions.   Will watch your condition.   Will get help right away if you are not doing well or get worse.  Document Released: 03/15/2004 Document Revised: 06/06/2011 Document Reviewed: 05/21/2011 [facility] Patient Information 2012 [place], [place].Constipation in Adults Constipation is having fewer than 2 bowel movements per week. Usually, the stools are hard. As we grow older, constipation is more common. If you try to fix constipation with laxatives, the problem may get  worse. This is because laxatives taken over a long period of time make the colon muscles weaker. A low-fiber diet, not taking in enough fluids, and taking some medicines may make these problems worse. MEDICATIONS THAT MAY CAUSE CONSTIPATION  Water pills (diuretics).   Calcium channel blockers (used to control blood pressure and for the heart).   Certain pain medicines (narcotics).   Anticholinergics.   Anti-inflammatory agents.   Antacids that contain aluminum.  DISEASES THAT CONTRIBUTE TO CONSTIPATION  Diabetes.   Parkinson's disease.   Dementia.   Stroke.   Depression.   Illnesses that cause problems with salt and water metabolism.  HOME CARE INSTRUCTIONS    Constipation is usually best cared for without medicines. Increasing dietary fiber and eating more fruits and vegetables is the best way to manage constipation.   Slowly increase fiber intake to 25 to 38 grams per day. Whole grains, fruits, vegetables, and legumes are good sources of fiber. A dietitian can further help you incorporate high-fiber foods into your diet.   Drink enough water and fluids to keep your urine clear or pale yellow.   A fiber supplement may be added to your  diet if you cannot get enough fiber from foods.   Increasing your activities also helps improve regularity.   Suppositories, as suggested by your caregiver, will also help. If you are using antacids, such as aluminum or calcium containing products, it will be helpful to switch to products containing magnesium if your caregiver says it is okay.   If you have been given a liquid injection (enema) today, this is only a temporary measure. It should not be relied on for treatment of longstanding (chronic) constipation.   Stronger measures, such as magnesium sulfate, should be avoided if possible. This may cause uncontrollable diarrhea. Using magnesium sulfate may not allow you time to make it to the bathroom.  SEEK IMMEDIATE MEDICAL CARE IF:     There is bright red blood in the stool.   The constipation stays for more than 4 days.   There is belly (abdominal) or rectal pain.   You do not seem to be getting better.   You have any questions or concerns.  MAKE SURE YOU:    Understand these instructions.   Will watch your condition.   Will get help right away if you are not doing well or get worse.  Document Released: 03/15/2004 Document Revised: 06/06/2011 Document Reviewed: 05/21/2011 [facility] Patient Information 2012 [place], [place].Constipation in Adults Constipation is having fewer than 2 bowel movements per week. Usually, the stools are hard. As we grow older, constipation is more common. If you try to fix constipation with laxatives, the problem may get worse. This is because laxatives taken over a long period of time make the colon muscles weaker. A low-fiber diet, not taking in enough fluids, and taking some medicines may make these problems worse. MEDICATIONS THAT MAY CAUSE CONSTIPATION  Water pills (diuretics).   Calcium channel blockers (used to control blood pressure and for the heart).   Certain pain medicines (narcotics).   Anticholinergics.   Anti-inflammatory agents.   Antacids that contain aluminum.  DISEASES THAT CONTRIBUTE TO CONSTIPATION  Diabetes.   Parkinson's disease.   Dementia.   Stroke.   Depression.   Illnesses that cause problems with salt and water metabolism.  HOME CARE INSTRUCTIONS    Constipation is usually best cared for without medicines. Increasing dietary fiber and eating more fruits and vegetables is the best way to manage constipation.   Slowly increase fiber intake to 25 to 38 grams per day. Whole grains, fruits, vegetables, and legumes are good sources of fiber. A dietitian can further help you incorporate high-fiber foods into your diet.   Drink enough water and fluids to keep your urine clear or pale yellow.   A fiber supplement may be added to your diet if you  cannot get enough fiber from foods.   Increasing your activities also helps improve regularity.   Suppositories, as suggested by your caregiver, will also help. If you are using antacids, such as aluminum or calcium containing products, it will be helpful to switch to products containing magnesium if your caregiver says it is okay.   If you have been given a liquid injection (enema) today, this is only a temporary measure. It should not be relied on for treatment of longstanding (chronic) constipation.   Stronger measures, such as magnesium sulfate, should be avoided if possible. This may cause uncontrollable diarrhea. Using magnesium sulfate may not allow you time to make it to the bathroom.  SEEK IMMEDIATE MEDICAL CARE IF:    There is bright red blood in the stool.  The constipation stays for more than 4 days.   There is belly (abdominal) or rectal pain.   You do not seem to be getting better.   You have any questions or concerns.  MAKE SURE YOU:    Understand these instructions.   Will watch your condition.   Will get help right away if you are not doing well or get worse.  Document Released: 03/15/2004 Document Revised: 06/06/2011 Document Reviewed: 05/21/2011 [facility] Patient Information 2012 [place], [place].Constipation in Adults Constipation is having fewer than 2 bowel movements per week. Usually, the stools are hard. As we grow older, constipation is more common. If you try to fix constipation with laxatives, the problem may get worse. This is because laxatives taken over a long period of time make the colon muscles weaker. A low-fiber diet, not taking in enough fluids, and taking some medicines may make these problems worse. MEDICATIONS THAT MAY CAUSE CONSTIPATION  Water pills (diuretics).   Calcium channel blockers (used to control blood pressure and for the heart).   Certain pain medicines (narcotics).   Anticholinergics.   Anti-inflammatory agents.   Antacids  that contain aluminum.  DISEASES THAT CONTRIBUTE TO CONSTIPATION  Diabetes.   Parkinson's disease.   Dementia.   Stroke.   Depression.   Illnesses that cause problems with salt and water metabolism.  HOME CARE INSTRUCTIONS    Constipation is usually best cared for without medicines. Increasing dietary fiber and eating more fruits and vegetables is the best way to manage constipation.   Slowly increase fiber intake to 25 to 38 grams per day. Whole grains, fruits, vegetables, and legumes are good sources of fiber. A dietitian can further help you incorporate high-fiber foods into your diet.   Drink enough water and fluids to keep your urine clear or pale yellow.   A fiber supplement may be added to your diet if you cannot get enough fiber from foods.   Increasing your activities also helps improve regularity.   Suppositories, as suggested by your caregiver, will also help. If you are using antacids, such as aluminum or calcium containing products, it will be helpful to switch to products containing magnesium if your caregiver says it is okay.   If you have been given a liquid injection (enema) today, this is only a temporary measure. It should not be relied on for treatment of longstanding (chronic) constipation.   Stronger measures, such as magnesium sulfate, should be avoided if possible. This may cause uncontrollable diarrhea. Using magnesium sulfate may not allow you time to make it to the bathroom.  SEEK IMMEDIATE MEDICAL CARE IF:    There is bright red blood in the stool.   The constipation stays for more than 4 days.   There is belly (abdominal) or rectal pain.   You do not seem to be getting better.   You have any questions or concerns.  MAKE SURE YOU:    Understand these instructions.   Will watch your condition.   Will get help right away if you are not doing well or get worse.  Document Released: 03/15/2004 Document Revised: 06/06/2011 Document Reviewed:  05/21/2011 [facility] Patient Information 2012 [place], [place].Constipation in Adults Constipation is having fewer than 2 bowel movements per week. Usually, the stools are hard. As we grow older, constipation is more common. If you try to fix constipation with laxatives, the problem may get worse. This is because laxatives taken over a long period of time make the colon muscles weaker. A low-fiber  diet, not taking in enough fluids, and taking some medicines may make these problems worse. MEDICATIONS THAT MAY CAUSE CONSTIPATION  Water pills (diuretics).   Calcium channel blockers (used to control blood pressure and for the heart).   Certain pain medicines (narcotics).   Anticholinergics.   Anti-inflammatory agents.   Antacids that contain aluminum.  DISEASES THAT CONTRIBUTE TO CONSTIPATION  Diabetes.   Parkinson's disease.   Dementia.   Stroke.   Depression.   Illnesses that cause problems with salt and water metabolism.  HOME CARE INSTRUCTIONS    Constipation is usually best cared for without medicines. Increasing dietary fiber and eating more fruits and vegetables is the best way to manage constipation.   Slowly increase fiber intake to 25 to 38 grams per day. Whole grains, fruits, vegetables, and legumes are good sources of fiber. A dietitian can further help you incorporate high-fiber foods into your diet.   Drink enough water and fluids to keep your urine clear or pale yellow.   A fiber supplement may be added to your diet if you cannot get enough fiber from foods.   Increasing your activities also helps improve regularity.   Suppositories, as suggested by your caregiver, will also help. If you are using antacids, such as aluminum or calcium containing products, it will be helpful to switch to products containing magnesium if your caregiver says it is okay.   If you have been given a liquid injection (enema) today, this is only a temporary measure. It should not be relied  on for treatment of longstanding (chronic) constipation.   Stronger measures, such as magnesium sulfate, should be avoided if possible. This may cause uncontrollable diarrhea. Using magnesium sulfate may not allow you time to make it to the bathroom.  SEEK IMMEDIATE MEDICAL CARE IF:    There is bright red blood in the stool.   The constipation stays for more than 4 days.   There is belly (abdominal) or rectal pain.   You do not seem to be getting better.   You have any questions or concerns.  MAKE SURE YOU:    Understand these instructions.   Will watch your condition.   Will get help right away if you are not doing well or get worse.  Document Released: 03/15/2004 Document Revised: 06/06/2011 Document Reviewed: 05/21/2011 [facility] Patient Information 2012 [place], [place].Constipation in Adults Constipation is having fewer than 2 bowel movements per week. Usually, the stools are hard. As we grow older, constipation is more common. If you try to fix constipation with laxatives, the problem may get worse. This is because laxatives taken over a long period of time make the colon muscles weaker. A low-fiber diet, not taking in enough fluids, and taking some medicines may make these problems worse. MEDICATIONS THAT MAY CAUSE CONSTIPATION  Water pills (diuretics).   Calcium channel blockers (used to control blood pressure and for the heart).   Certain pain medicines (narcotics).   Anticholinergics.   Anti-inflammatory agents.   Antacids that contain aluminum.  DISEASES THAT CONTRIBUTE TO CONSTIPATION  Diabetes.   Parkinson's disease.   Dementia.   Stroke.   Depression.   Illnesses that cause problems with salt and water metabolism.  HOME CARE INSTRUCTIONS    Constipation is usually best cared for without medicines. Increasing dietary fiber and eating more fruits and vegetables is the best way to manage constipation.   Slowly increase fiber intake to 25 to 38 grams per  day. Whole grains, fruits, vegetables, and legumes  are good sources of fiber. A dietitian can further help you incorporate high-fiber foods into your diet.   Drink enough water and fluids to keep your urine clear or pale yellow.   A fiber supplement may be added to your diet if you cannot get enough fiber from foods.   Increasing your activities also helps improve regularity.   Suppositories, as suggested by your caregiver, will also help. If you are using antacids, such as aluminum or calcium containing products, it will be helpful to switch to products containing magnesium if your caregiver says it is okay.   If you have been given a liquid injection (enema) today, this is only a temporary measure. It should not be relied on for treatment of longstanding (chronic) constipation.   Stronger measures, such as magnesium sulfate, should be avoided if possible. This may cause uncontrollable diarrhea. Using magnesium sulfate may not allow you time to make it to the bathroom.  SEEK IMMEDIATE MEDICAL CARE IF:    There is bright red blood in the stool.   The constipation stays for more than 4 days.   There is belly (abdominal) or rectal pain.   You do not seem to be getting better.   You have any questions or concerns.  MAKE SURE YOU:    Understand these instructions.   Will watch your condition.   Will get help right away if you are not doing well or get worse.  Document Released: 03/15/2004 Document Revised: 06/06/2011 Document Reviewed: 05/21/2011 [facility] Patient Information 2012 [place], [place].

## 2012-03-04 ENCOUNTER — Encounter: Payer: Self-pay | Admitting: Internal Medicine

## 2012-03-04 NOTE — Progress Notes (Signed)
Subjective:    Patient ID: [name] [name], male    DOB: [birth year], 78 y.o.   MRN: [id number]  HPI  78 year old patient who is seen today for followup.  He has a history of type 2 diabetes. His perineal and leg pain has greatly improved with active therapy for metastatic prostate cancer he has had a steady increase in weight as well as rising hemoglobin A1c's. He is a copy by his daughter today in general he feels much improved he has very  little pain. He is followed closely by urology. Since his last visit here he has also been seen by general surgery for evaluation of GI ST. 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 He has hypertension and dyslipidemia which have been stable. He has coronary artery disease and remains asymptomatic. He remains on Aricept for mild cognitive dysfunction. Medical regimen includes Celebrex and Zoloft  He has a history of diverticulosis and IBS. Complaints today are mainly GI he has constipation issues and at times loose stool associated with incontinence. Presently is taking very little in the way of narcotics  Wt Readings from Last 3 Encounters:  03/03/12 192 lb (87.091 kg)  02/11/12 187 lb 12.8 oz (85.186 kg)  12/31/11 190 lb (86.183 kg)      Past Medical History  Diagnosis Date  . Hypertension   . Diabetes mellitus   . Hyperlipidemia   . Memory loss     mild  . Rectal ulcer   . CAD (coronary artery disease)     S/P CABG x 5 1998; S/P stent LCX 2009  . Constipation   . History of ventricular fibrillation 1998    while on treadmill  . Hypothyroidism   . Prostate cancer   . IBS (irritable bowel syndrome)   . Colitis   . ED (erectile dysfunction)   . OA (osteoarthritis)   . Neuromuscular disorder   . Osteoporosis   . Heart attack     History   Social History  . Marital Status: Widowed    Spouse Name: N/A    Number of Children: N/A  . Years of Education: N/A   Occupational History  . Not on  file.   Social History Main Topics  . Smoking status: Former Games developer  . Smokeless tobacco: Former Neurosurgeon    Quit date: 02/11/1948  . Alcohol Use: No  . Drug Use: No  . Sexually Active: Not on file   Other Topics Concern  . Not on file   Social History Narrative  . No narrative on file    Past Surgical History  Procedure Date  . Coronary artery bypass graft 1998    x5, with a left internal mammary to the LAD and diagonal, vein graft to the OM1 and 2, and a vein graft to the right coronary artery  . Coronary stent placement 11/13/2007    LCX    Family History  Problem Relation Age of Onset  . Stroke Father   . Pneumonia Father   . Colon cancer Neg Hx     Allergies  Allergen Reactions  . Codeine Phosphate     REACTION: unspecified    Current Outpatient Prescriptions on File Prior to Visit  Medication Sig Dispense Refill  . ACCU-CHEK COMPACT TEST DRUM test strip TEST BLOOD SUGAR ONCE DAILY AND AS NEEDED  102 each  6  . amLODipine-valsartan (EXFORGE) 10-320 MG per tablet Take 1 tablet by mouth daily.  90 tablet  3  . aspirin 81 MG tablet Take 81 mg by mouth daily.        [name] atorvastatin (LIPITOR) 10 MG tablet TAKE 1 TABLET BY MOUTH EVERY DAY  90 tablet  3  . Calcium Citrate (CITRACAL PO) Take by mouth.      . Denosumab (XGEVA [place]) Inject into the skin.      [name] donepezil (ARICEPT) 10 MG tablet Take 1 tablet (10 mg total) by mouth daily.  90 tablet  3  . fish oil-omega-3 fatty acids 1000 MG capsule Take 1 g by mouth 2 (two) times a week.       [name] glimepiride (AMARYL) 4 MG tablet Take 1 tablet (4 mg total) by mouth daily before breakfast.  90 tablet  1  . levothyroxine (SYNTHROID, LEVOTHROID) 50 MCG tablet Take 50 mcg by mouth daily.      . metFORMIN (GLUCOPHAGE) 1000 MG tablet 1000 mg in the morning and 500 milligrams in the evening  180 tablet  3  . Multiple Vitamin (MULTIVITAMIN) tablet Take 1 tablet by mouth daily.        . nitroGLYCERIN (NITROSTAT) 0.4 MG SL tablet Place 0.4  mg under the tongue every 5 (five) minutes as needed. For chest pain      . pregabalin (LYRICA) 50 MG capsule Take 1 capsule (50 mg total) by mouth 2 (two) times daily.  180 capsule  4  . sertraline (ZOLOFT) 50 MG tablet Take 1 tablet (50 mg total) by mouth daily.  90 tablet  2  . tapentadol (NUCYNTA) 50 MG TABS Take 50 mg by mouth every 6 (six) hours as needed. For pain      . DISCONTD: chlorthalidone (HYGROTON) 25 MG tablet Take 12.5 mg by mouth daily.        BP 114/70  Temp 98.2 F (36.8 C) (Oral)  Wt 192 lb (87.091 kg)    Review of Systems  Constitutional: Positive for unexpected weight change. Negative for fever, chills, appetite change and fatigue.  HENT: Negative for hearing loss, ear pain, congestion, sore throat, trouble swallowing, neck stiffness, dental problem, voice change and tinnitus.   Eyes: Negative for pain, discharge and visual disturbance.  Respiratory: Negative for cough, chest tightness, wheezing and stridor.   Cardiovascular: Negative for chest pain, palpitations and leg swelling.  Gastrointestinal: Negative for nausea, vomiting, abdominal pain, diarrhea, constipation, blood in stool and abdominal distention.  Genitourinary: Negative for urgency, hematuria, flank pain, discharge, difficulty urinating and genital sores.  Musculoskeletal: Negative for myalgias, back pain, joint swelling, arthralgias and gait problem.  Skin: Negative for rash.  Neurological: Negative for dizziness, syncope, speech difficulty, weakness, numbness and headaches.  Hematological: Negative for adenopathy. Does not bruise/bleed easily.  Psychiatric/Behavioral: Positive for decreased concentration. Negative for behavioral problems and dysphoric mood. The patient is not nervous/anxious.        Objective:   Physical Exam  Constitutional: He is oriented to person, place, and time. He appears well-developed.       Overweight. Blood pressure low normal no distress  HENT:  Head:  Normocephalic.  Right Ear: External ear normal.  Left Ear: External ear normal.  Eyes: Conjunctivae and EOM are normal.  Neck: Normal range of motion.  Cardiovascular: Normal rate and normal heart sounds.   Pulmonary/Chest: Breath sounds normal.  Abdominal: Bowel sounds are normal.  Musculoskeletal: Normal range of motion. He exhibits no edema and no tenderness.  Neurological: He is alert and oriented to person, place, and  time.  Psychiatric: He has a normal mood and affect. His behavior is normal.          Assessment & Plan:   Diabetes mellitus. Suboptimal control dietary factors exercise discussed. [facility] will consider discontinuation of Lyrica next visit if his neuropathic pain remained stable Hypertension stable Constipation. These issues were addressed at length Dyslipidemia stable   Recheck 2 months

## 2012-03-05 ENCOUNTER — Ambulatory Visit: Payer: Medicare Other | Admitting: Physical Therapy

## 2012-03-10 ENCOUNTER — Ambulatory Visit: Payer: Medicare Other | Attending: Orthopedic Surgery | Admitting: Physical Therapy

## 2012-03-10 DIAGNOSIS — R269 Unspecified abnormalities of gait and mobility: Secondary | ICD-10-CM | POA: Insufficient documentation

## 2012-03-10 DIAGNOSIS — IMO0001 Reserved for inherently not codable concepts without codable children: Secondary | ICD-10-CM | POA: Insufficient documentation

## 2012-03-10 DIAGNOSIS — M6281 Muscle weakness (generalized): Secondary | ICD-10-CM | POA: Insufficient documentation

## 2012-03-12 ENCOUNTER — Ambulatory Visit: Payer: Medicare Other | Admitting: Physical Therapy

## 2012-03-17 ENCOUNTER — Ambulatory Visit (INDEPENDENT_AMBULATORY_CARE_PROVIDER_SITE_OTHER): Payer: Medicare Other | Admitting: Internal Medicine

## 2012-03-17 ENCOUNTER — Encounter: Payer: Self-pay | Admitting: Internal Medicine

## 2012-03-17 VITALS — BP 114/80 | Temp 98.3°F | Wt 189.0 lb

## 2012-03-17 DIAGNOSIS — Z23 Encounter for immunization: Secondary | ICD-10-CM

## 2012-03-17 DIAGNOSIS — E119 Type 2 diabetes mellitus without complications: Secondary | ICD-10-CM

## 2012-03-17 DIAGNOSIS — R413 Other amnesia: Secondary | ICD-10-CM

## 2012-03-17 DIAGNOSIS — I10 Essential (primary) hypertension: Secondary | ICD-10-CM

## 2012-03-17 DIAGNOSIS — E785 Hyperlipidemia, unspecified: Secondary | ICD-10-CM

## 2012-03-17 MED ORDER — SITAGLIPTIN PHOSPHATE 100 MG PO TABS: 100.0000 mg | ORAL_TABLET | Freq: Every day | ORAL | Status: DC

## 2012-03-17 NOTE — Progress Notes (Signed)
  Subjective:    Patient ID: [name] [name], male    DOB: 1930-05-14, 78 y.o.   MRN: [id number]  HPI  78 year old patient who is seen today for followup of his type 2 diabetes.  This has been poorly controlled recently. Apparently he inadvertently omitted glimepiride  therapy and has resumed this only since August 3; blood sugars are consistently over 200 and occasionally over 300. He remains on metformin 1000 mg in the morning and 500 mg in the afternoon. His blood sugar log was reviewed  Wt Readings from Last 3 Encounters:  03/17/12 189 lb (85.73 kg)  03/03/12 192 lb (87.091 kg)  02/11/12 187 lb 12.8 oz (85.186 kg)    Review of Systems  Constitutional: Negative for fever, chills, appetite change and fatigue.  HENT: Negative for hearing loss, ear pain, congestion, sore throat, trouble swallowing, neck stiffness, dental problem, voice change and tinnitus.   Eyes: Negative for pain, discharge and visual disturbance.  Respiratory: Negative for cough, chest tightness, wheezing and stridor.   Cardiovascular: Negative for chest pain, palpitations and leg swelling.  Gastrointestinal: Negative for nausea, vomiting, abdominal pain, diarrhea, constipation, blood in stool and abdominal distention.  Genitourinary: Negative for urgency, hematuria, flank pain, discharge, difficulty urinating and genital sores.  Musculoskeletal: Negative for myalgias, back pain, joint swelling, arthralgias and gait problem.  Skin: Negative for rash.  Neurological: Negative for dizziness, syncope, speech difficulty, weakness, numbness and headaches.  Hematological: Negative for adenopathy. Does not bruise/bleed easily.  Psychiatric/Behavioral: Negative for behavioral problems and dysphoric mood. The patient is not nervous/anxious.        Objective:   Physical Exam  Constitutional: He appears well-developed and well-nourished. No distress.       Weight 189          Assessment & Plan:   Diabetes mellitus poor  control. Will resume Amaryl 4 mg daily and continue metformin at the present dose Will add Januvia 100 mg daily and reassess in 4 weeks. Nonpharmacologic measures discussed

## 2012-03-17 NOTE — Patient Instructions (Signed)
It is important that you exercise regularly, at least 20 minutes 3 to 4 times per week.  If you develop chest pain or shortness of breath seek  medical attention.  Limit your sodium (Salt) intake  You need to lose weight.  Consider a lower calorie diet and regular exercise.   Please check your hemoglobin A1c every 3 months

## 2012-03-18 ENCOUNTER — Telehealth (INDEPENDENT_AMBULATORY_CARE_PROVIDER_SITE_OTHER): Payer: Self-pay

## 2012-03-18 NOTE — Telephone Encounter (Signed)
Pt returned my call. The pt was given his appt with Dr Michaell Cowing on 04/01/12.

## 2012-03-18 NOTE — Telephone Encounter (Signed)
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

## 2012-04-01 ENCOUNTER — Encounter (INDEPENDENT_AMBULATORY_CARE_PROVIDER_SITE_OTHER): Payer: Self-pay | Admitting: Surgery

## 2012-04-01 ENCOUNTER — Ambulatory Visit (INDEPENDENT_AMBULATORY_CARE_PROVIDER_SITE_OTHER): Payer: Medicare Other | Admitting: Surgery

## 2012-04-01 VITALS — BP 140/74 | HR 47 | Temp 98.3°F | Resp 18 | Ht 68.0 in | Wt 189.8 lb

## 2012-04-01 DIAGNOSIS — K589 Irritable bowel syndrome without diarrhea: Secondary | ICD-10-CM

## 2012-04-01 DIAGNOSIS — D214 Benign neoplasm of connective and other soft tissue of abdomen: Secondary | ICD-10-CM

## 2012-04-01 NOTE — Patient Instructions (Addendum)
See the Handout(s) we gave you.  Consider surgery.  Please call our office at [phone] if you wish to schedule surgery or if you have further questions / concerns.   Treatment Options for Gastrointestinal Stromal Tumors Resectable Gastrointestinal Stromal Tumors Unresectable Gastrointestinal Stromal Tumors Metastatic and Recurrent Gastrointestinal Stromal Tumors Refractory Gastrointestinal Stromal Tumors Treatment Options in Clinical Trials  A link to a list of current clinical trials is included at the end of this section. For some types or stages of cancer, there may not be any trials listed. Check with your doctor for clinical trials that are not listed here but may be right for you. Resectable Gastrointestinal Stromal Tumors  Resectable gastrointestinal stromal tumors (GISTs) can be completely or almost completely removed by surgery. Treatment may include the following:  Surgery to remove tumors that are 2 centimeters or larger. Laparoscopic surgery may be done if the tumor is 5 cm or smaller. If there are cancer cells remaining at the edges of the area where the tumor was removed, watchful waiting or targeted therapy with imatinib mesylate may follow.  A clinical trial of targeted therapy with imatinib mesylate following surgery, to decrease the chance the tumor will recur (come back).    Laparoscopic Bowel (Stomach) Resection Laparoscopic bowel resection is used to remove a piece of large or small intestine that may be red, sore, or swollen (inflamed) or to remove a portion of bowel that is blocked. LET YOUR CAREGIVER KNOW ABOUT:   Allergies to food or medicine.  Medicines taken, including vitamins, herbs, eyedrops, over-the-counter medicines, and creams.  Use of steroids (by mouth or creams).  Previous problems with anesthetics or numbing medicines.  History of bleeding problems or blood clots.  Previous surgery.  Other health problems, including diabetes and kidney  problems.  Possibility of pregnancy, if this applies. RISKS AND COMPLICATIONS   Infection.  Bleeding.  Injury to other organs.  Anesthetic side effects.  Leakage from where the bowel is put back together (anastomosis).  Long delay before return of bowel function (ileus). BEFORE THE PROCEDURE   You should be present 60 minutes before your procedure or as directed by your caregiver. PROCEDURE  Laparoscopic means that the procedure is done with a thin, lighted tube (laparoscope). Once you are given medicine that makes you sleep (general anesthetic), your surgeon inflates your abdomen with carbon dioxide gas. The laparoscope is put into your abdomen through a small cut (incision). This allows your surgeon to see into the abdomen. A video camera is attached to the laparoscope to enlarge the view. The surgeon sees this image on a monitor. During the procedure, the portion of bowel to be removed is taken out through one of the incisions. The incision may need to be enlarged if the bowel is too large to be removed through one of the smaller incisions. In this case, a small incision will be made and sometimes the bowel repair is made outside the abdomen. AFTER THE PROCEDURE  If there are no problems, recovery time is brief compared to regular surgery. You will rest in a recovery room until you are stable and doing well. Following this, if you have no other problems, you will be allowed to return to your room. Recovery time varies depending on what is found during surgery, your age, and your general health. Sometimes, it takes a few days for bowel function to return (passing gas, bowel movements). You will stay in the hospital until bowel function returns. Sometimes, a tube is  placed in your stomach, through your nose (nasogastric tube). This tube is used to release pressure from your stomach (decompress) and to decrease nausea until bowel function returns. Document Released: 12/11/2000 Document  Revised: 12/17/2011 Document Reviewed: 11/06/2009 [facility] Patient Information 2013 [place], [place].

## 2012-04-02 ENCOUNTER — Ambulatory Visit: Payer: Medicare Other | Admitting: Internal Medicine

## 2012-04-03 NOTE — Progress Notes (Signed)
Subjective:     Patient ID: [name] [name], male   DOB: 05-Apr-1930, 78 y.o.   MRN: [id number]  HPI  Judah VICKIE PONDS  [birth year] [id number]  Patient Care Team: Gordy Savers, MD as PCP - General Marcine Matar, MD as Consulting Physician (Urology) Roger Shelter, MD as Consulting Physician (Cardiology) Mardella Layman, MD as Attending Physician (Gastroenterology)  This patient is a 78 y.o.male who presents today for surgical evaluation at the request of Dr. Gerrit Friends.   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  Consideration of laparoscopic resection.  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  Daughter is a judge.  Has a nurse in the family as well.  He comes today with his son.  He has metastatic prostate cancer stable on hormonal therapy for quite a few years.  History coronary disease status post bypass and then stenting.  Followed by Dr. Rudean Haskell in the past.  He thinks that may have switched recently to a different cardiologist.  Off aggressive anticoagulation just on aspirin only.  Has struggled with some instability walking but improving with a cane on physical therapy.    He was found on workup to have an abnormal mass in his stomach.  Biopsy consistent with gastrointestinal stromal tumor.  About 4 cm in size.  This was detected last year.   He was sent for surgical evaluation.  Felt to be resectable.  Patient was not interested in an open resection.  It was brought up to consider laparoscopic resection.  Sent to me consider this.  Patient Active Problem List  Diagnosis  . HYPOTHYROIDISM  . DIABETES MELLITUS, TYPE II  . DIABETIC PERIPHERAL NEUROPATHY  . HYPERLIPIDEMIA  . IRON DEFICIENCY  . ANEMIA NEC  . MILD COGNITIVE IMPAIRMENT SO STATED  . HYPERTENSION  . CORONARY ARTERY DISEASE  . INTERNAL HEMORRHOIDS  . GERD  . DIVERTICULOSIS, COLON  . ANAL SPASM  . ULCER OF ANUS AND RECTUM  . BACK PAIN  . LEG CRAMPS, NOCTURNAL  . SYMPTOM,  MALAISE AND FATIGUE NEC  . PROSTATE CANCER, HX OF  . Fatigue  . Memory loss  . Rectal ulcer  . IHD (ischemic heart disease)  . Constipation  . Bruises easily  . History of ventricular fibrillation  . Hypothyroidism  . Prostate cancer  . IBS (irritable bowel syndrome)  . Colitis  . ED (erectile dysfunction)  . OA (osteoarthritis)  . Abdominal pain  . Chest pain  . Depression  . GIST (gastrointestinal stromal tumor), non-malignant    Past Medical History  Diagnosis Date  . Hypertension   . Diabetes mellitus   . Hyperlipidemia   . Memory loss     mild  . Rectal ulcer   . CAD (coronary artery disease)     S/P CABG x 5 1998; S/P stent LCX 2009  . Constipation   . History of ventricular fibrillation 1998    while on treadmill  . Hypothyroidism   . Prostate cancer   . IBS (irritable bowel syndrome)   . Colitis   . ED (erectile dysfunction)   . OA (osteoarthritis)   . Neuromuscular disorder   . Osteoporosis   . Heart attack     Past Surgical History  Procedure Date  . Coronary artery bypass graft 1998    x5, with a left internal mammary to the LAD and diagonal, vein graft to the OM1 and 2, and a vein graft to the right coronary artery  .  Coronary stent placement 11/13/2007    LCX    History   Social History  . Marital Status: Widowed    Spouse Name: N/A    Number of Children: N/A  . Years of Education: N/A   Occupational History  . Not on file.   Social History Main Topics  . Smoking status: Former Games developer  . Smokeless tobacco: Former Neurosurgeon    Quit date: 02/11/1948  . Alcohol Use: No  . Drug Use: No  . Sexually Active: Not on file   Other Topics Concern  . Not on file   Social History Narrative  . No narrative on file    Family History  Problem Relation Age of Onset  . Stroke Father   . Pneumonia Father   . Colon cancer Neg Hx     Current Outpatient Prescriptions  Medication Sig Dispense Refill  . ACCU-CHEK COMPACT TEST DRUM test strip TEST  BLOOD SUGAR ONCE DAILY AND AS NEEDED  102 each  6  . amLODipine-valsartan (EXFORGE) 10-320 MG per tablet Take 1 tablet by mouth daily.  90 tablet  3  . aspirin 81 MG tablet Take 81 mg by mouth daily.        [name] atorvastatin (LIPITOR) 10 MG tablet TAKE 1 TABLET BY MOUTH EVERY DAY  90 tablet  3  . Calcium Citrate (CITRACAL PO) Take by mouth.      . Denosumab (XGEVA [place]) Inject into the skin.      [name] docusate sodium (COLACE) 50 MG capsule Take by mouth 2 (two) times daily.      [name] donepezil (ARICEPT) 10 MG tablet Take 1 tablet (10 mg total) by mouth daily.  90 tablet  3  . fish oil-omega-3 fatty acids 1000 MG capsule Take 1 g by mouth 2 (two) times a week.       [name] glimepiride (AMARYL) 4 MG tablet Take 1 tablet (4 mg total) by mouth daily before breakfast.  90 tablet  1  . leuprolide (LUPRON) 3.75 MG injection Inject 3.75 mg into the muscle every 4 (four) months.      . levothyroxine (SYNTHROID, LEVOTHROID) 50 MCG tablet Take 50 mcg by mouth daily.      . metFORMIN (GLUCOPHAGE) 1000 MG tablet 1000 mg in the morning and 500 milligrams in the evening  180 tablet  3  . Multiple Vitamin (MULTIVITAMIN) tablet Take 1 tablet by mouth daily.        . nitroGLYCERIN (NITROSTAT) 0.4 MG SL tablet Place 0.4 mg under the tongue every 5 (five) minutes as needed. For chest pain      . pregabalin (LYRICA) 50 MG capsule Take 1 capsule (50 mg total) by mouth 2 (two) times daily.  180 capsule  4  . sertraline (ZOLOFT) 50 MG tablet Take 1 tablet (50 mg total) by mouth daily.  90 tablet  2  . sitaGLIPtin (JANUVIA) 100 MG tablet Take 1 tablet (100 mg total) by mouth daily.      . tapentadol (NUCYNTA) 50 MG TABS Take 50 mg by mouth every 6 (six) hours as needed. For pain      . Sennosides (SENOKOT PO) Take by mouth daily.      [name] DISCONTD: chlorthalidone (HYGROTON) 25 MG tablet Take 12.5 mg by mouth daily.         Allergies  Allergen Reactions  . Codeine Phosphate     REACTION: unspecified    BP 140/74  Pulse 47  Temp  98.3 F (36.8  C) (Temporal)  Resp 18  Ht 5\' 8"  (1.727 m)  Wt 189 lb 12.8 oz (86.093 kg)  BMI 28.86 kg/m2  *RADIOLOGY REPORT*  Clinical Data: Follow up of GI stromal tumor. Diagnosed in 2012.  Diarrhea and constipation. History prostate cancer 2005 with  radiation therapy. Recurrence in 2012.  CT ABDOMEN AND PELVIS WITH CONTRAST  Technique: Multidetector CT imaging of the abdomen and pelvis was  performed following the standard protocol during bolus  administration of intravenous contrast.  Contrast: OMNIPAQUE IOHEXOL 300 MG/ML SOLN  Comparison: 12/20/2011 bone scan. Abdominal pelvic CT of  01/15/2011.  Findings: Clear lung bases. Prior median sternotomy with mild  cardiomegaly. No pericardial or pleural effusion. Normal liver,  spleen. Redemonstration of a lesser curvature gastric lesion which  measures 3.9 x 2.1 cm on image 21 versus 4.2 x 2.5 cm on the prior.  This extends medially into the gastrohepatic ligament. No  obstruction.  Mild pancreatic atrophy. Normal gallbladder, biliary tract,  adrenal glands.  Mild bilateral renal atrophy, without hydronephrosis.  Atherosclerosis at the origin of the right greater than left renal  arteries. No retroperitoneal or retrocrural adenopathy.  Extensive colonic diverticulosis with sigmoid wall thickening,  likely indicative of muscular hypertrophy. Suspect mild  uncomplicated diverticulitis with edema adjacent the sigmoid on  image 65 of series 3. Also coronal image 83.Normal terminal ileum  and appendix. Normal small bowel without abdominal ascites.  No evidence of omental or peritoneal disease.  Tiny fat containing left inguinal hernia. No pelvic adenopathy.  Prostatectomy. Normal urinary bladder. Resolution of left  hydroureter since 01/15/2011. No significant free fluid.  New focus of sclerosis within the posterior left iliac on image 56  of series 3 since 01/15/2011. T9 metastatic lesion measures 2.1 cm  on image 2. This  is new since since 09/26/2010.  IMPRESSION:  1. Extensive colonic diverticulosis with suspicion of  uncomplicated mild sigmoid diverticulitis.  2. Similar to minimal decrease size of a lesser curvature gastric  mass. Likely the known GI stromal tumor.  3. Osseous metastasis, new since 09/26/2010. Presumably secondary  to prostate carcinoma.  Original Report Authenticated By: Consuello Bossier, M.D.    Review of Systems  Constitutional: Negative for fever, chills and diaphoresis.  HENT: Negative for nosebleeds, sore throat, facial swelling, mouth sores, trouble swallowing and ear discharge.   Eyes: Negative for photophobia, discharge and visual disturbance.  Respiratory: Positive for shortness of breath. Negative for cough, choking, chest tightness, wheezing and stridor.   Cardiovascular: Negative for chest pain and palpitations.       Patient walks 15 minutes for about 0.25-0.5 miles gradually without difficulty.  No exertional chest/neck/shoulder/arm pain.   Gastrointestinal: Negative for nausea, vomiting, abdominal pain, diarrhea, constipation, blood in stool, abdominal distention, anal bleeding and rectal pain.       No personal nor family history of GI/colon cancer, inflammatory bowel disease, irritable bowel syndrome, allergy such as Celiac Sprue, dietary/dairy problems, colitis, ulcers nor gastritis.    No recent sick contacts/gastroenteritis.  No travel outside the country.  No changes in diet.    Genitourinary: Negative for dysuria, urgency, frequency, difficulty urinating and testicular pain.  Musculoskeletal: Negative for myalgias, back pain, arthralgias and gait problem.  Skin: Negative for color change, pallor, rash and wound.  Neurological: Negative for dizziness, speech difficulty, weakness, numbness and headaches.  Hematological: Negative for adenopathy. Does not bruise/bleed easily.  Psychiatric/Behavioral: Negative for hallucinations, confusion and agitation.         Objective:  Physical Exam  Constitutional: He is oriented to person, place, and time. He appears well-developed and well-nourished. No distress.  HENT:  Head: Normocephalic.  Mouth/Throat: Oropharynx is clear and moist. No oropharyngeal exudate.  Eyes: Conjunctivae normal and EOM are normal. Pupils are equal, round, and reactive to light. No scleral icterus.  Neck: Normal range of motion. Neck supple. No tracheal deviation present.  Cardiovascular: Normal rate, regular rhythm and intact distal pulses.   Pulmonary/Chest: Effort normal and breath sounds normal. No respiratory distress.  Abdominal: Soft. He exhibits no distension. There is no tenderness. Hernia confirmed negative in the right inguinal area and confirmed negative in the left inguinal area.  Musculoskeletal: Normal range of motion. He exhibits no tenderness.  Lymphadenopathy:    He has no cervical adenopathy.       Right: No inguinal adenopathy present.       Left: No inguinal adenopathy present.  Neurological: He is alert and oriented to person, place, and time. No cranial nerve deficit. He exhibits normal muscle tone. Coordination normal.  Skin: Skin is warm and dry. No rash noted. He is not diaphoretic. No erythema. No pallor.  Psychiatric: His behavior is normal. Judgment and thought content normal. His mood appears not anxious. His affect is blunt. His affect is not angry, not labile and not inappropriate. He is not agitated. Cognition and memory are not impaired. He does not exhibit a depressed mood. He exhibits normal recent memory and normal remote memory.       Assessment:     4 cm stable lesion in proximal stomach.  Biopsy consistent with gastrointestinal stromal tumor.  Metastatic prostate cancer disease, stable.    Plan:     Standard of care would be consider resection of this gastrointestinal stroma tumor.  A bili, it has not changed in a year.  I do not think it is gotten smaller but it is probably just  variation since it is only a few millimeters difference from last year.  There is no urgency.  He is not actively bleeding.  He is not obstructed.  It is not getting markedly larger.  However, it could get him into further trouble.  I discussed the case with the patient and his son.  He is a reasonable laparoscopic candidate given the fact he has never had any prior abdominal surgery.  I think this is for way enough from the cardia that could be a red wedge stapled resection.  Hopefully just a few hours.  Hopefully he just a few days in the hospital.  However, with his coronary disease and other health issues, I would want cardiac clearance first to make sure he is a reasonable candidate.  I discussed the case with his urologist, Dr. Hillis Range.  While the patient does have some evidence of metastatic prostate cancer, it is mild and rather stable.  He feels this can be well controlled for many many years.  Therefore, I think it is reasonable to be relatively aggressive.  I did discuss the procedure with the patient and his son:  The anatomy & physiology of the foregut and anti-reflux mechanism was discussed.  The pathophysiology of GIST tumors discussed.  Natural history risks without surgery was discussed.   The patient's condition will not be treated adequately by medicines and other non-operative treatments.  I feel the risks of no intervention will lead to serious problems that outweigh the operative risks; therefore, I recommended surgery to resect the area of stomach containing the GIST.  Need for a thorough workup to rule out the differential diagnosis and plan treatment was explained.  I explained laparoscopic techniques with possible need for an open approach.  Risks such as bleeding, infection, abscess, leak, need for further treatment, heart attack, death, and other risks were discussed.   I noted a good likelihood this will help address the problem.  Goals of post-operative recovery were discussed  as well.  Possibility that this will not correct all symptoms was explained.  Post-operative dysphagia, need for short-term liquid & pureed diet, possible need for medicines to help control symptoms in addition to surgery were discussed.  We will work to minimize complications.   Educational handouts further explaining the pathology, treatment options, and dysphagia diet was given as well.  Questions were answered.    The patient expresses understanding.  He wishes to hold off on considering any surgery until the holidays.    He noted the patient tends to want to delay things.  The patient does note his wife passed away this past year and he will be a rough time for him to handle this.  The mass is not changed markedly in a year, so I think it is reasonable to wait a few months after the Holidays to revisit this.  Again I would want cardiac clearance first.  I discussed with my referring partner, Dr. Gerrit Friends.

## 2012-04-03 NOTE — Assessment & Plan Note (Signed)
Proximal stomach along lesser curve.  Noted 2012

## 2012-04-09 ENCOUNTER — Telehealth (INDEPENDENT_AMBULATORY_CARE_PROVIDER_SITE_OTHER): Payer: Self-pay | Admitting: General Surgery

## 2012-04-09 NOTE — Telephone Encounter (Signed)
Cardiac clearance request sent to Dr Antoine Poche. Will contact patient when we hear back about clearance.

## 2012-04-09 NOTE — Telephone Encounter (Signed)
Message copied by Liliana Cline on Thu Apr 09, 2012 10:43 AM ------      Message from: Zacarias Pontes      Created: Thu Apr 09, 2012 10:24 AM       Pt has decided after talking with family and his docs that he wants to go ahead and set up a sx for his GIST.pls go ahead and get cardiac clrx and proceed with sched his sx.you may call him back at [phone].Marland KitchenMarland KitchenThanks

## 2012-04-13 ENCOUNTER — Encounter: Payer: Self-pay | Admitting: Internal Medicine

## 2012-04-13 ENCOUNTER — Ambulatory Visit (INDEPENDENT_AMBULATORY_CARE_PROVIDER_SITE_OTHER): Payer: Medicare Other | Admitting: Internal Medicine

## 2012-04-13 VITALS — BP 110/70 | Wt 194.0 lb

## 2012-04-13 DIAGNOSIS — D214 Benign neoplasm of connective and other soft tissue of abdomen: Secondary | ICD-10-CM

## 2012-04-13 DIAGNOSIS — E1149 Type 2 diabetes mellitus with other diabetic neurological complication: Secondary | ICD-10-CM

## 2012-04-13 DIAGNOSIS — E119 Type 2 diabetes mellitus without complications: Secondary | ICD-10-CM

## 2012-04-13 DIAGNOSIS — I10 Essential (primary) hypertension: Secondary | ICD-10-CM

## 2012-04-13 DIAGNOSIS — I2510 Atherosclerotic heart disease of native coronary artery without angina pectoris: Secondary | ICD-10-CM

## 2012-04-13 LAB — HEMOGLOBIN A1C: Hgb A1c MFr Bld: 7 % — ABNORMAL HIGH (ref 4.6–6.5)

## 2012-04-13 NOTE — Progress Notes (Signed)
Subjective:    Patient ID: [name] [name], male    DOB: [birth year], 78 y.o.   MRN: [id number]  HPI 66 -year-old patient who is seen today for followup of his type 2 diabetes.  More recently has enjoyed improved glycemic control. His last hemoglobin A1c had increased to 7.6. Is scheduled to see cardiology soon for preoperative clearance for GI surgery for GI ST. Denies any cardiopulmonary complaints. Weight gain has been a problem on Lyrica. He continues to have some mild pain involving his left foot but this seems fairly minor and stable. We will consider a modest dose reduction  Wt Readings from Last 3 Encounters:  04/13/12 194 lb (87.998 kg)  04/01/12 189 lb 12.8 oz (86.093 kg)  03/17/12 189 lb (85.73 kg)     Past Medical History  Diagnosis Date  . Hypertension   . Diabetes mellitus   . Hyperlipidemia   . Memory loss     mild  . Rectal ulcer   . CAD (coronary artery disease)     S/P CABG x 5 1998; S/P stent LCX 2009  . Constipation   . History of ventricular fibrillation 1998    while on treadmill  . Hypothyroidism   . Prostate cancer   . IBS (irritable bowel syndrome)   . Colitis   . ED (erectile dysfunction)   . OA (osteoarthritis)   . Neuromuscular disorder   . Osteoporosis   . Heart attack     History   Social History  . Marital Status: Widowed    Spouse Name: N/A    Number of Children: N/A  . Years of Education: N/A   Occupational History  . Not on file.   Social History Main Topics  . Smoking status: Former Games developer  . Smokeless tobacco: Former Neurosurgeon    Quit date: 02/11/1948  . Alcohol Use: No  . Drug Use: No  . Sexually Active: Not on file   Other Topics Concern  . Not on file   Social History Narrative  . No narrative on file    Past Surgical History  Procedure Date  . Coronary artery bypass graft 1998    x5, with a left internal mammary to the LAD and diagonal, vein graft to the OM1 and 2, and a vein graft to the right coronary artery  .  Coronary stent placement 11/13/2007    LCX    Family History  Problem Relation Age of Onset  . Stroke Father   . Pneumonia Father   . Colon cancer Neg Hx     Allergies  Allergen Reactions  . Codeine Phosphate     REACTION: unspecified    Current Outpatient Prescriptions on File Prior to Visit  Medication Sig Dispense Refill  . ACCU-CHEK COMPACT TEST DRUM test strip TEST BLOOD SUGAR ONCE DAILY AND AS NEEDED  102 each  6  . amLODipine-valsartan (EXFORGE) 10-320 MG per tablet Take 1 tablet by mouth daily.  90 tablet  3  . aspirin 81 MG tablet Take 81 mg by mouth daily.        [name] atorvastatin (LIPITOR) 10 MG tablet TAKE 1 TABLET BY MOUTH EVERY DAY  90 tablet  3  . Calcium Citrate (CITRACAL PO) Take by mouth.      . Denosumab (XGEVA [place]) Inject into the skin.      [name] docusate sodium (COLACE) 50 MG capsule Take by mouth 2 (two) times daily.      [name] donepezil (ARICEPT) 10 MG tablet  Take 1 tablet (10 mg total) by mouth daily.  90 tablet  3  . fish oil-omega-3 fatty acids 1000 MG capsule Take 1 g by mouth 2 (two) times a week.       [name] glimepiride (AMARYL) 4 MG tablet Take 1 tablet (4 mg total) by mouth daily before breakfast.  90 tablet  1  . leuprolide (LUPRON) 3.75 MG injection Inject 3.75 mg into the muscle every 4 (four) months.      . levothyroxine (SYNTHROID, LEVOTHROID) 50 MCG tablet Take 50 mcg by mouth daily.      . metFORMIN (GLUCOPHAGE) 1000 MG tablet 1000 mg in the morning and 500 milligrams in the evening  180 tablet  3  . Multiple Vitamin (MULTIVITAMIN) tablet Take 1 tablet by mouth daily.        . nitroGLYCERIN (NITROSTAT) 0.4 MG SL tablet Place 0.4 mg under the tongue every 5 (five) minutes as needed. For chest pain      . pregabalin (LYRICA) 50 MG capsule Take 1 capsule (50 mg total) by mouth 2 (two) times daily.  180 capsule  4  . Sennosides (SENOKOT PO) Take by mouth daily.      . sertraline (ZOLOFT) 50 MG tablet Take 1 tablet (50 mg total) by mouth daily.  90 tablet  2  .  sitaGLIPtin (JANUVIA) 100 MG tablet Take 1 tablet (100 mg total) by mouth daily.      . tapentadol (NUCYNTA) 50 MG TABS Take 50 mg by mouth every 6 (six) hours as needed. For pain      . DISCONTD: chlorthalidone (HYGROTON) 25 MG tablet Take 12.5 mg by mouth daily.        BP 110/70  Wt 194 lb (87.998 kg)    Review of Systems  Constitutional: Positive for appetite change and unexpected weight change. Negative for fever, chills and fatigue.  HENT: Negative for hearing loss, ear pain, congestion, sore throat, trouble swallowing, neck stiffness, dental problem, voice change and tinnitus.   Eyes: Negative for pain, discharge and visual disturbance.  Respiratory: Negative for cough, chest tightness, wheezing and stridor.   Cardiovascular: Negative for chest pain, palpitations and leg swelling.  Gastrointestinal: Negative for nausea, vomiting, abdominal pain, diarrhea, constipation, blood in stool and abdominal distention.  Genitourinary: Negative for urgency, hematuria, flank pain, discharge, difficulty urinating and genital sores.  Musculoskeletal: Negative for myalgias, back pain, joint swelling, arthralgias and gait problem.  Skin: Negative for rash.  Neurological: Positive for numbness. Negative for dizziness, syncope, speech difficulty, weakness and headaches.  Hematological: Negative for adenopathy. Does not bruise/bleed easily.  Psychiatric/Behavioral: Negative for behavioral problems and dysphoric mood. The patient is not nervous/anxious.        Objective:   Physical Exam  Constitutional: He is oriented to person, place, and time. He appears well-developed.       Blood pressure 120/60 to  HENT:  Head: Normocephalic.  Right Ear: External ear normal.  Left Ear: External ear normal.  Eyes: Conjunctivae normal and EOM are normal.  Neck: Normal range of motion.  Cardiovascular: Normal rate and normal heart sounds.   Pulmonary/Chest: Breath sounds normal.  Abdominal: Bowel sounds are  normal.  Musculoskeletal: Normal range of motion. He exhibits no edema and no tenderness.  Neurological: He is alert and oriented to person, place, and time.  Psychiatric: He has a normal mood and affect. His behavior is normal.          Assessment & Plan:  Diabetes mellitus. Will  check a hemoglobin A1c better diet weight loss exercise all encouraged Hypertension stable Coronary artery disease. Clinically stable. Followup cardiology  Recheck 3 months

## 2012-04-13 NOTE — Progress Notes (Signed)
Quick Note:  Spoke with pt- informed of results ______ 

## 2012-04-13 NOTE — Patient Instructions (Signed)
Limit your sodium (Salt) intake    It is important that you exercise regularly, at least 20 minutes 3 to 4 times per week.  If you develop chest pain or shortness of breath seek  medical attention.  You need to lose weight.  Consider a lower calorie diet and regular exercise.   Please check your hemoglobin A1c every 3 months   

## 2012-04-14 ENCOUNTER — Ambulatory Visit (INDEPENDENT_AMBULATORY_CARE_PROVIDER_SITE_OTHER): Payer: Medicare Other | Admitting: Physician Assistant

## 2012-04-14 ENCOUNTER — Encounter: Payer: Self-pay | Admitting: Physician Assistant

## 2012-04-14 VITALS — BP 139/80 | HR 55 | Ht 68.0 in | Wt 193.0 lb

## 2012-04-14 DIAGNOSIS — D214 Benign neoplasm of connective and other soft tissue of abdomen: Secondary | ICD-10-CM

## 2012-04-14 DIAGNOSIS — I25810 Atherosclerosis of coronary artery bypass graft(s) without angina pectoris: Secondary | ICD-10-CM

## 2012-04-14 DIAGNOSIS — E785 Hyperlipidemia, unspecified: Secondary | ICD-10-CM

## 2012-04-14 DIAGNOSIS — I10 Essential (primary) hypertension: Secondary | ICD-10-CM

## 2012-04-14 DIAGNOSIS — Z01810 Encounter for preprocedural cardiovascular examination: Secondary | ICD-10-CM

## 2012-04-14 NOTE — Progress Notes (Signed)
[address] [place], [place]  [address] Phone: [phone] Fax:  [phone]  Date:  04/14/2012   Name:  [name] [name]   DOB:  01-15-1930   MRN:  [id number]  PCP:  Rogelia Boga, MD  Primary Cardiologist:  Dr. Rollene Rotunda  Primary Electrophysiologist:  None    History of Present Illness: [name] [name] is a 78 y.o. male who returns for surgical clearance.  He has a hx of CAD, s/p CABG.  LHC 5/09: RCA proximal 90%, 95%, distally occluded, pCFX stents patent, OM1 30%, small left atrial branch 95%, oLAD occluded, S-dRCA patent, L-LAD/Dx patent, EF 60%. Medical therapy was continued at that time. Previously followed by Dr. Deborah Chalk. He established with Dr. Antoine Poche in 04/2011. Other history includes DM2, HTN, HL, hypothyroidism. He has a history of prostate cancer. He also has a history of GIST (gastrointestinal stromal tumor). He was evaluated in 3/13 in the hospital for chest pain. Lexiscan Myoview at that time demonstrated an EF of 63% and no scar or ischemia.  He needs laparoscopic resection of his GIST with Dr. Michaell Cowing.  Patient notes improved activity level since earlier this year.  He was placed on XGEVA and Lupron for his recurrent prostate cancer at that time.  His PSA has come down.  He walk ~ 1/2 mile 1-2 days a week and works out at [employer]  He denies significant dyspnea.  Biggest problem is balance.  No orthopnea, PND, edema.  No chest pain.  No syncope.    Labs (3/13):  K 3.3, creatinine 1.39, ALT 14, Hgb 10.6 Labs (8/13):  Creatinine 1.38  Wt Readings from Last 3 Encounters:  04/14/12 193 lb (87.544 kg)  04/13/12 194 lb (87.998 kg)  04/01/12 189 lb 12.8 oz (86.093 kg)     Past Medical History  Diagnosis Date  . Hypertension   . Diabetes mellitus   . Hyperlipidemia   . Memory loss     mild  . Rectal ulcer   . CAD (coronary artery disease)     S/P CABG x 5 1998; S/P stent LCX 2009  . Constipation   . History of ventricular  fibrillation 1998    while on treadmill  . Hypothyroidism   . Prostate cancer   . IBS (irritable bowel syndrome)   . Colitis   . ED (erectile dysfunction)   . OA (osteoarthritis)   . Neuromuscular disorder   . Osteoporosis   . Heart attack     Current Outpatient Prescriptions  Medication Sig Dispense Refill  . ACCU-CHEK COMPACT TEST DRUM test strip TEST BLOOD SUGAR ONCE DAILY AND AS NEEDED  102 each  6  . amLODipine-valsartan (EXFORGE) 10-320 MG per tablet Take 1 tablet by mouth daily.  90 tablet  3  . aspirin 81 MG tablet Take 81 mg by mouth daily.        [name] atorvastatin (LIPITOR) 10 MG tablet TAKE 1 TABLET BY MOUTH EVERY DAY  90 tablet  3  . Calcium Citrate (CITRACAL PO) Take by mouth.      . Denosumab (XGEVA [place]) Inject into the skin.      [name] docusate sodium (COLACE) 50 MG capsule Take by mouth 2 (two) times daily.      [name] donepezil (ARICEPT) 10 MG tablet Take 1 tablet (10 mg total) by mouth daily.  90 tablet  3  . fish oil-omega-3 fatty acids 1000 MG capsule Take 1 g by mouth 2 (two) times a week.       [name]  glimepiride (AMARYL) 4 MG tablet Take 1 tablet (4 mg total) by mouth daily before breakfast.  90 tablet  1  . leuprolide (LUPRON) 3.75 MG injection Inject 3.75 mg into the muscle every 4 (four) months.      . levothyroxine (SYNTHROID, LEVOTHROID) 50 MCG tablet Take 50 mcg by mouth daily.      . metFORMIN (GLUCOPHAGE) 1000 MG tablet 1000 mg in the morning and 500 milligrams in the evening  180 tablet  3  . Multiple Vitamin (MULTIVITAMIN) tablet Take 1 tablet by mouth daily.        . nitroGLYCERIN (NITROSTAT) 0.4 MG SL tablet Place 0.4 mg under the tongue every 5 (five) minutes as needed. For chest pain      . pregabalin (LYRICA) 50 MG capsule Take 1 capsule (50 mg total) by mouth 2 (two) times daily.  180 capsule  4  . Sennosides (SENOKOT PO) Take by mouth daily.      . sertraline (ZOLOFT) 50 MG tablet Take 1 tablet (50 mg total) by mouth daily.  90 tablet  2  . tapentadol (NUCYNTA)  50 MG TABS Take 50 mg by mouth every 6 (six) hours as needed. For pain      . sitaGLIPtin (JANUVIA) 100 MG tablet Take 1 tablet (100 mg total) by mouth daily.      [name] DISCONTD: chlorthalidone (HYGROTON) 25 MG tablet Take 12.5 mg by mouth daily.        Allergies: Allergies  Allergen Reactions  . Codeine Phosphate     REACTION: unspecified    History  Substance Use Topics  . Smoking status: Former Games developer  . Smokeless tobacco: Former Neurosurgeon    Quit date: 02/11/1948  . Alcohol Use: No     ROS:  Please see the history of present illness.   He alternates b/t diarrhea and constipation.   All other systems reviewed and negative.   PHYSICAL EXAM: VS:  BP 139/80  Pulse 55  Ht 5\' 8"  (1.727 m)  Wt 193 lb (87.544 kg)  BMI 29.35 kg/m2 Well nourished, well developed, in no acute distress HEENT: normal Neck: no JVD Cardiac:  normal S1, S2; RRR; no murmur Lungs:  clear to auscultation bilaterally, no wheezing, rhonchi or rales Abd: soft, nontender, no hepatomegaly Ext: no edema Skin: warm and dry Neuro:  CNs 2-12 intact, no focal abnormalities noted  EKG:  Sinus brady, HR 49, RBBB, no change from prior tracing      ASSESSMENT AND PLAN:  1. Coronary Artery Disease:  He is not having any unstable cardiac conditions.  He had a low risk myoview in 08/2011.  He can achieve ~ 4 METs without significant dyspnea.  His in sinus rhythm on his ECG which is not changed.  According to ACC/AHA guidelines, he does not require any further cardiac workup prior to his non-cardiac surgery.  He is at mild to moderate risk for cardiovascular complications.  He is not on a beta blocker and I will not start this due to his bradycardia.  Ideally, I would like to see him on an ASA throughout the peri-op period.  However, if the bleeding risk is too great, this can be held and restarted post-op when safe.  Our service is available in the peri-op period as necessary.  2. Hypertension:  Controlled.  Continue current  therapy.  3. Hyperlipidemia:  Continue statin.  4. Prostate Cancer:  Managed by urology.  5. GIST:  Surgery pending as noted.   Signed, Lorin Picket  Alben Spittle, PA-C  12:47 PM 04/14/2012

## 2012-04-14 NOTE — Patient Instructions (Addendum)
Your physician recommends that you schedule a follow-up appointment in: 3-4 MONTHS WITH DR. HOCHREIN  NO CHANGES WERE MADE TODAY

## 2012-04-15 ENCOUNTER — Other Ambulatory Visit (INDEPENDENT_AMBULATORY_CARE_PROVIDER_SITE_OTHER): Payer: Self-pay | Admitting: Surgery

## 2012-04-16 ENCOUNTER — Ambulatory Visit: Payer: Medicare Other | Admitting: Internal Medicine

## 2012-04-16 ENCOUNTER — Other Ambulatory Visit: Payer: Self-pay

## 2012-04-16 MED ORDER — LEVOTHYROXINE SODIUM 50 MCG PO TABS: 50.0000 ug | ORAL_TABLET | Freq: Every day | ORAL | Status: DC

## 2012-05-20 ENCOUNTER — Encounter (HOSPITAL_COMMUNITY): Payer: Self-pay | Admitting: Pharmacy Technician

## 2012-05-20 NOTE — Pre-Procedure Instructions (Signed)
[address] Le[address] Heffelfinger  05/20/2012   Your procedure is scheduled on:  Thursday December 5  Report to [facility] at 5:30 AM.  Call this number if you have problems the morning of surgery: [phone]   Remember:   Do not eat or drink:After Midnight.    Take these medicines the morning of surgery with A SIP OF WATER: Donepezil, Levothyroxine, Pregabalin, Sertraline. Bring nitroglycerin to surgery.    Do not wear jewelry, make-up or nail polish.  Do not wear lotions, powders, or perfumes. You may wear deodorant.  Do not shave 48 hours prior to surgery. Men may shave face and neck.  Do not bring valuables to the hospital.  Contacts, dentures or bridgework may not be worn into surgery.  Leave suitcase in the car. After surgery it may be brought to your room.  For patients admitted to the hospital, checkout time is 11:00 AM the day of discharge.   Patients discharged the day of surgery will not be allowed to drive home.  Name and phone number of your driver: NA  Special Instructions: Shower using CHG 2 nights before surgery and the night before surgery.  If you shower the day of surgery use CHG.  Use special wash - you have one bottle of CHG for all showers.  You should use approximately 1/3 of the bottle for each shower.   Please read over the following fact sheets that you were given: Pain Booklet, Coughing and Deep Breathing and Surgical Site Infection Prevention

## 2012-05-21 ENCOUNTER — Other Ambulatory Visit (INDEPENDENT_AMBULATORY_CARE_PROVIDER_SITE_OTHER): Payer: Self-pay | Admitting: Surgery

## 2012-05-21 ENCOUNTER — Encounter (HOSPITAL_COMMUNITY): Payer: Self-pay

## 2012-05-21 ENCOUNTER — Telehealth (INDEPENDENT_AMBULATORY_CARE_PROVIDER_SITE_OTHER): Payer: Self-pay | Admitting: General Surgery

## 2012-05-21 ENCOUNTER — Encounter (HOSPITAL_COMMUNITY)
Admission: RE | Admit: 2012-05-21 | Discharge: 2012-05-21 | Disposition: A | Discharge status: Home / Self Care | Payer: Medicare Other | Source: Ambulatory Visit | Attending: Surgery | Admitting: Surgery

## 2012-05-21 DIAGNOSIS — Z01812 Encounter for preprocedural laboratory examination: Secondary | ICD-10-CM | POA: Insufficient documentation

## 2012-05-21 DIAGNOSIS — I10 Essential (primary) hypertension: Secondary | ICD-10-CM | POA: Insufficient documentation

## 2012-05-21 DIAGNOSIS — E785 Hyperlipidemia, unspecified: Secondary | ICD-10-CM | POA: Insufficient documentation

## 2012-05-21 DIAGNOSIS — K626 Ulcer of anus and rectum: Secondary | ICD-10-CM | POA: Insufficient documentation

## 2012-05-21 DIAGNOSIS — K594 Anal spasm: Secondary | ICD-10-CM | POA: Insufficient documentation

## 2012-05-21 DIAGNOSIS — Z8546 Personal history of malignant neoplasm of prostate: Secondary | ICD-10-CM | POA: Insufficient documentation

## 2012-05-21 DIAGNOSIS — E039 Hypothyroidism, unspecified: Secondary | ICD-10-CM | POA: Insufficient documentation

## 2012-05-21 DIAGNOSIS — K219 Gastro-esophageal reflux disease without esophagitis: Secondary | ICD-10-CM | POA: Insufficient documentation

## 2012-05-21 DIAGNOSIS — E119 Type 2 diabetes mellitus without complications: Secondary | ICD-10-CM | POA: Insufficient documentation

## 2012-05-21 DIAGNOSIS — R252 Cramp and spasm: Secondary | ICD-10-CM | POA: Insufficient documentation

## 2012-05-21 DIAGNOSIS — I2510 Atherosclerotic heart disease of native coronary artery without angina pectoris: Secondary | ICD-10-CM | POA: Insufficient documentation

## 2012-05-21 DIAGNOSIS — D509 Iron deficiency anemia, unspecified: Secondary | ICD-10-CM | POA: Insufficient documentation

## 2012-05-21 DIAGNOSIS — B957 Other staphylococcus as the cause of diseases classified elsewhere: Secondary | ICD-10-CM | POA: Insufficient documentation

## 2012-05-21 DIAGNOSIS — G3184 Mild cognitive impairment, so stated: Secondary | ICD-10-CM | POA: Insufficient documentation

## 2012-05-21 HISTORY — DX: Major depressive disorder, single episode, unspecified: F32.9

## 2012-05-21 HISTORY — DX: Gastro-esophageal reflux disease without esophagitis: K21.9

## 2012-05-21 HISTORY — DX: Depression, unspecified: F32.A

## 2012-05-21 LAB — BASIC METABOLIC PANEL
BUN: 18 mg/dL (ref 6–23)
Calcium: 9.2 mg/dL (ref 8.4–10.5)
Chloride: 104 mEq/L (ref 96–112)
Creatinine, Ser: 1.23 mg/dL (ref 0.50–1.35)
GFR calc Af Amer: 63 mL/min — ABNORMAL LOW (ref >90)
GFR calc non Af Amer: 54 mL/min — ABNORMAL LOW (ref >90)

## 2012-05-21 LAB — CBC
HCT: 33 % — ABNORMAL LOW (ref 39.0–52.0)
MCHC: 35.5 g/dL (ref 30.0–36.0)
Platelets: 148 10*3/uL — ABNORMAL LOW (ref 150–400)
RDW: 13.9 % (ref 11.5–15.5)
WBC: 4.2 10*3/uL (ref 4.0–10.5)

## 2012-05-21 LAB — SURGICAL PCR SCREEN
MRSA, PCR: NEGATIVE
Staphylococcus aureus: POSITIVE — AB

## 2012-05-21 NOTE — Telephone Encounter (Signed)
[name] [name] called to request new orders be put in [facility], previous orders are from [facility] He is scheduled to be in short stay tomorrow/ thanks/gy/Stacey- [phone].

## 2012-05-21 NOTE — Progress Notes (Signed)
Primary Physician - Dr. Frederica Kuster Cardiologist - Corinda Gubler  Stress Test in March - epic EKG, Echo - few weeks ago at [facility] - epic

## 2012-05-21 NOTE — Progress Notes (Signed)
05/21/12 1017  OBSTRUCTIVE SLEEP APNEA  Have you ever been diagnosed with sleep apnea through a sleep study? No  Do you snore loudly (loud enough to be heard through closed doors)?  0  Do you often feel tired, fatigued, or sleepy during the daytime? 1  Has anyone observed you stop breathing during your sleep? 0  Do you have, or are you being treated for high blood pressure? 1  BMI more than 35 kg/m2? 0  Age over 76 years old? 1  Neck circumference greater than 40 cm/18 inches? 0  Gender: 1  Obstructive Sleep Apnea Score 4   Score 4 or greater  Results sent to PCP

## 2012-05-21 NOTE — Progress Notes (Signed)
Notified dr. Gordy Savers office of need to put updated orders in computer. Current orders are from 04/15/12 which are out of date.

## 2012-05-22 ENCOUNTER — Telehealth: Payer: Self-pay | Admitting: *Deleted

## 2012-05-22 NOTE — Telephone Encounter (Signed)
Is is okay to refill Sertraline 50 mg for pt? Thanks

## 2012-06-01 ENCOUNTER — Telehealth: Payer: Self-pay | Admitting: Internal Medicine

## 2012-06-01 NOTE — Telephone Encounter (Signed)
Patient Information:  Caller Name: Dreshaun  Phone: [phone]  Patient: [name] [name], [name] [name]  Gender: Male  DOB: April 03, 1930  Age: 78 Years  PCP: Eleonore Chiquito [facility])   Symptoms  Reason For Call & Symptoms: Congestion in chest, scheduled for surgery 06/04/12.  Reviewed Health History In EMR: Yes  Reviewed Medications In EMR: Yes  Reviewed Allergies In EMR: Yes  Reviewed Surgeries / Procedures: Yes  Date of Onset of Symptoms: 05/27/2012  Treatments Tried: Sudafed, then it went to chest; Mucinex, Cough Syrup.  Treatments Tried Worked: Yes  Guideline(s) Used:  Cold Symptoms (Upper Respiratory Infection)  Colds  Disposition Per Guideline:   See Today or Tomorrow in Office  Reason For Disposition Reached:   Patient wants to be seen  Advice Given:  For a Stuffy Nose - Use Nasal Washes:  Introduction: Saline (salt water) nasal irrigation (nasal wash) is an effective and simple home remedy for treating stuffy nose and sinus congestion. The nose can be irrigated by pouring, spraying, or squirting salt water into the nose and then letting it run back out.  Treatment for Associated Symptoms of Colds:  For muscle aches, headaches, or moderate fever (more than 101 F or 38.9 C): Take acetaminophen every 4 hours.  Sore throat: Try throat lozenges, hard candy, or warm chicken broth.  Hydrate: Drink adequate liquids.  Humidifier:  If the air in your home is dry, use a cool-mist humidifier  Contagiousness:  Cover your nose and mouth with a tissue when you sneeze or cough.  Wash your hands frequently with soap and water.  Call Back If:  Difficulty breathing occurs  Nasal discharge lasts more than 10 days  Office Follow Up:  Does the office need to follow up with this patient?: No  Instructions For The Office: N/A  Appointment Scheduled:  06/02/2012 09:15:00

## 2012-06-02 ENCOUNTER — Encounter: Payer: Self-pay | Admitting: Internal Medicine

## 2012-06-02 ENCOUNTER — Ambulatory Visit: Admit: 2012-06-02 | Payer: Self-pay | Admitting: Surgery

## 2012-06-02 ENCOUNTER — Ambulatory Visit (INDEPENDENT_AMBULATORY_CARE_PROVIDER_SITE_OTHER): Payer: Medicare Other | Admitting: Internal Medicine

## 2012-06-02 VITALS — BP 140/70 | HR 68 | Temp 98.0°F | Resp 22 | Wt 193.0 lb

## 2012-06-02 DIAGNOSIS — J069 Acute upper respiratory infection, unspecified: Secondary | ICD-10-CM

## 2012-06-02 DIAGNOSIS — E119 Type 2 diabetes mellitus without complications: Secondary | ICD-10-CM

## 2012-06-02 DIAGNOSIS — I10 Essential (primary) hypertension: Secondary | ICD-10-CM

## 2012-06-02 DIAGNOSIS — D214 Benign neoplasm of connective and other soft tissue of abdomen: Secondary | ICD-10-CM

## 2012-06-02 SURGERY — LAPAROSCOPIC PARTIAL GASTRECTOMY
Anesthesia: General

## 2012-06-02 NOTE — Progress Notes (Signed)
Subjective:    Patient ID: [name] [name], male    DOB: Oct 12, 1929, 78 y.o.   MRN: [id number]  HPI  78 year old patient who presents with a chief complaint of cough. He has been ill for 6 days with head congestion drainage cough and general malaise. Cough is nonproductive. No real shortness of breath or chest pain. Has had no fever or chills.  He has type 2 diabetes. Last hemoglobin A1c 7.0 He was scheduled for surgery for a GIST in 2 days but this was canceled due to his acute illness.  Past Medical History  Diagnosis Date  . Hypertension   . Hyperlipidemia   . Memory loss     mild  . Rectal ulcer   . CAD (coronary artery disease)     S/P CABG x 5 1998; S/P stent LCX 2009  . Constipation   . History of ventricular fibrillation 1998    while on treadmill  . Hypothyroidism   . Prostate cancer   . IBS (irritable bowel syndrome)   . Colitis   . ED (erectile dysfunction)   . Neuromuscular disorder   . Osteoporosis   . Heart attack   . Depression   . Diabetes mellitus     fasting blood sugar 140-200 usually  . GERD (gastroesophageal reflux disease)     History   Social History  . Marital Status: Widowed    Spouse Name: N/A    Number of Children: N/A  . Years of Education: N/A   Occupational History  . Not on file.   Social History Main Topics  . Smoking status: Former Games developer  . Smokeless tobacco: Former Neurosurgeon    Quit date: 02/11/1948  . Alcohol Use: No  . Drug Use: No  . Sexually Active: Not on file   Other Topics Concern  . Not on file   Social History Narrative  . No narrative on file    Past Surgical History  Procedure Date  . Coronary artery bypass graft 1998    x5, with a left internal mammary to the LAD and diagonal, vein graft to the OM1 and 2, and a vein graft to the right coronary artery  . Coronary stent placement 11/13/2007    LCX  . Tigger finger repair     left    Family History  Problem Relation Age of Onset  . Stroke Father   .  Pneumonia Father   . Colon cancer Neg Hx     Allergies  Allergen Reactions  . Codeine Phosphate Other (See Comments)    REACTION: mouth sores    Current Outpatient Prescriptions on File Prior to Visit  Medication Sig Dispense Refill  . amLODipine-valsartan (EXFORGE) 10-320 MG per tablet Take 1 tablet by mouth daily.      [name] aspirin EC 81 MG tablet Take 81 mg by mouth daily.      [name] atorvastatin (LIPITOR) 10 MG tablet Take 10 mg by mouth daily.      . Calcium Citrate-Vitamin D (CITRACAL + D PO) Take 1 tablet by mouth daily.      . Denosumab (XGEVA [place]) Inject into the skin every 30 (thirty) days. Injects "around the first part of each month."      . docusate sodium (COLACE) 50 MG capsule Take by mouth 2 (two) times daily.      [name] donepezil (ARICEPT) 10 MG tablet Take 10 mg by mouth daily.      [name] glimepiride (AMARYL) 4 MG tablet Take 4  mg by mouth daily before breakfast.      . leuprolide (LUPRON) 3.75 MG injection Inject 3.75 mg into the muscle every 4 (four) months.      . levothyroxine (SYNTHROID, LEVOTHROID) 50 MCG tablet Take 50 mcg by mouth daily.      . metFORMIN (GLUCOPHAGE) 1000 MG tablet Take 500-1,000 mg by mouth 2 (two) times daily with a meal. Take 1,000mg  every morning and take 500mg  every evening.      . Multiple Vitamin (MULTIVITAMIN) tablet Take 1 tablet by mouth daily.        . nitroGLYCERIN (NITROSTAT) 0.4 MG SL tablet Place 0.4 mg under the tongue every 5 (five) minutes as needed. For chest pain      . omega-3 acid ethyl esters (LOVAZA) 1 G capsule Take 1 g by mouth 2 (two) times daily.      . pregabalin (LYRICA) 50 MG capsule Take 50 mg by mouth 2 (two) times daily.      . sertraline (ZOLOFT) 100 MG tablet Take 100 mg by mouth daily.      . sitaGLIPtin (JANUVIA) 100 MG tablet Take 100 mg by mouth daily.      . [DISCONTINUED] chlorthalidone (HYGROTON) 25 MG tablet Take 12.5 mg by mouth daily.        BP 140/70  Pulse 68  Temp 98 F (36.7 C) (Oral)  Resp 22  Wt 193 lb  (87.544 kg)  SpO2 94%       Review of Systems  Constitutional: Positive for activity change, appetite change and fatigue. Negative for fever and chills.  HENT: Positive for congestion and rhinorrhea. Negative for hearing loss, ear pain, sore throat, trouble swallowing, neck stiffness, dental problem, voice change and tinnitus.   Eyes: Negative for pain, discharge and visual disturbance.  Respiratory: Positive for cough. Negative for chest tightness, wheezing and stridor.   Cardiovascular: Negative for chest pain, palpitations and leg swelling.  Gastrointestinal: Negative for nausea, vomiting, abdominal pain, diarrhea, constipation, blood in stool and abdominal distention.  Genitourinary: Negative for urgency, hematuria, flank pain, discharge, difficulty urinating and genital sores.  Musculoskeletal: Negative for myalgias, back pain, joint swelling, arthralgias and gait problem.  Skin: Negative for rash.  Neurological: Negative for dizziness, syncope, speech difficulty, weakness, numbness and headaches.  Hematological: Negative for adenopathy. Does not bruise/bleed easily.  Psychiatric/Behavioral: Negative for behavioral problems and dysphoric mood. The patient is not nervous/anxious.        Objective:   Physical Exam  Constitutional: He is oriented to person, place, and time. He appears well-developed.  HENT:  Head: Normocephalic.  Right Ear: External ear normal.  Left Ear: External ear normal.  Eyes: Conjunctivae normal and EOM are normal.  Neck: Normal range of motion.  Cardiovascular: Normal rate and normal heart sounds.   Pulmonary/Chest: Effort normal and breath sounds normal. No respiratory distress. He has no wheezes. He has no rales. He exhibits no tenderness.  Abdominal: Bowel sounds are normal.  Musculoskeletal: Normal range of motion. He exhibits no edema and no tenderness.  Neurological: He is alert and oriented to person, place, and time.  Psychiatric: He has a  normal mood and affect. His behavior is normal.          Assessment & Plan:   Viral URI with cough. We'll treat symptomatically Diabetes mellitus GIST

## 2012-06-02 NOTE — Patient Instructions (Signed)
Get plenty of rest, Drink lots of  clear liquids, and use Tylenol or ibuprofen for fever and discomfort.    Acute bronchitis symptoms for less than 10 days are generally not helped by antibiotics.  Take over-the-counter expectorants and cough medications such as  Mucinex DM.  Call if there is no improvement in 5 to 7 days or if he developed worsening cough, fever, or new symptoms, such as shortness of breath or chest pain.

## 2012-06-04 ENCOUNTER — Ambulatory Visit (HOSPITAL_COMMUNITY): Admission: RE | Admit: 2012-06-04 | Payer: Medicare Other | Source: Ambulatory Visit | Admitting: Surgery

## 2012-06-04 ENCOUNTER — Encounter (HOSPITAL_COMMUNITY): Admission: RE | Payer: Self-pay | Source: Ambulatory Visit

## 2012-06-04 SURGERY — LAPAROSCOPIC PARTIAL GASTRECTOMY
Anesthesia: General

## 2012-06-05 ENCOUNTER — Telehealth: Payer: Self-pay | Admitting: Internal Medicine

## 2012-06-05 NOTE — Telephone Encounter (Signed)
Patient Information:  Caller Name: Zebbie  Phone: [phone]  Patient: [name] [name], [name] [name]  Gender: Male  DOB: 01-Jan-1930  Age: 78 Years  PCP: Eleonore Chiquito [facility])   Symptoms  Reason For Call & Symptoms: Patient in the office to see Dr. Kirtland Bouchard on 06/02/12. Diagnosed with URI and Mucinex and Cepachol.  He states ongoing coughing with some relief from the Mucinex.  Cough is dry. No fever,   He states that he is just not getting any better. Feels weak and tired  Reviewed Health History In EMR: Yes  Reviewed Medications In EMR: Yes  Reviewed Allergies In EMR: Yes  Reviewed Surgeries / Procedures: No  Date of Onset of Symptoms: 05/27/2012  Treatments Tried: Mucinex and Cephacol  Treatments Tried Worked: No  Guideline(s) Used:  Colds  Cough  Disposition Per Guideline:   See Within 3 Days in Office  Reason For Disposition Reached:   Cough has been present for > 10 days  Advice Given:  Cough Medicines:  OTC Cough Drops: Cough drops can help a lot, especially for mild coughs. They reduce coughing by soothing your irritated throat and removing that tickle sensation in the back of the throat. Cough drops also have the advantage of portability - you can carry them with you.  Home Remedy - Hard Candy: Hard candy works just as well as medicine-flavored OTC cough drops. Diabetics should use sugar-free candy.  Coughing Spasms:  Drink warm fluids. Inhale warm mist (Reason: both relax the airway and loosen up the phlegm).  Suck on cough drops or hard candy to coat the irritated throat.  Prevent Dehydration:  Drink adequate liquids.  This will help soothe an irritated or dry throat and loosen up the phlegm.  Avoid Tobacco Smoke:  Smoking or being exposed to smoke makes coughs much worse.  Expected Course:   The expected course depends on what is causing the cough.  Viral bronchitis (chest cold) causes a cough that lasts 1 to 3 weeks. Sometimes you may cough up lots of phlegm (sputum,  mucus). The mucus can normally be white, gray, yellow, or green.  Call Back If:  Difficulty breathing  Cough lasts more than 3 weeks  Fever lasts > 3 days  You become worse.  Office Follow Up:  Does the office need to follow up with this patient?: No  Instructions For The Office: N/A  Patient declines appointment today or Saturday [place] office . Will follow home care advice  Appointment Scheduled:  06/08/2012 09:15:00 Appointment Scheduled Provider:  Eleonore Chiquito [facility])

## 2012-06-08 ENCOUNTER — Ambulatory Visit (INDEPENDENT_AMBULATORY_CARE_PROVIDER_SITE_OTHER): Payer: Medicare Other | Admitting: Internal Medicine

## 2012-06-08 ENCOUNTER — Encounter: Payer: Self-pay | Admitting: Internal Medicine

## 2012-06-08 VITALS — BP 110/60 | HR 62 | Temp 97.6°F | Resp 20 | Wt 191.0 lb

## 2012-06-08 DIAGNOSIS — I10 Essential (primary) hypertension: Secondary | ICD-10-CM

## 2012-06-08 DIAGNOSIS — E119 Type 2 diabetes mellitus without complications: Secondary | ICD-10-CM

## 2012-06-08 DIAGNOSIS — J069 Acute upper respiratory infection, unspecified: Secondary | ICD-10-CM

## 2012-06-08 NOTE — Progress Notes (Signed)
Subjective:    Patient ID: [name] [name], male    DOB: 09-23-1929, 78 y.o.   MRN: [id number]  HPI  78 year old patient who is seen today in followup. He was seen approximately one week ago with URI symptoms. He continues to have weakness mild cough and chest congestion and just a general sense of unwellness. No shortness of breath fever or productive cough. Cough is fairly minor but at times associated with fecal incontinence. He has diabetes and hypertension which has been well-controlled. Fasting blood sugar this morning 160.  Past Medical History  Diagnosis Date  . Hypertension   . Hyperlipidemia   . Memory loss     mild  . Rectal ulcer   . CAD (coronary artery disease)     S/P CABG x 5 1998; S/P stent LCX 2009  . Constipation   . History of ventricular fibrillation 1998    while on treadmill  . Hypothyroidism   . Prostate cancer   . IBS (irritable bowel syndrome)   . Colitis   . ED (erectile dysfunction)   . Neuromuscular disorder   . Osteoporosis   . Heart attack   . Depression   . Diabetes mellitus     fasting blood sugar 140-200 usually  . GERD (gastroesophageal reflux disease)     History   Social History  . Marital Status: Widowed    Spouse Name: N/A    Number of Children: N/A  . Years of Education: N/A   Occupational History  . Not on file.   Social History Main Topics  . Smoking status: Former Games developer  . Smokeless tobacco: Former Neurosurgeon    Quit date: 02/11/1948  . Alcohol Use: No  . Drug Use: No  . Sexually Active: Not on file   Other Topics Concern  . Not on file   Social History Narrative  . No narrative on file    Past Surgical History  Procedure Date  . Coronary artery bypass graft 1998    x5, with a left internal mammary to the LAD and diagonal, vein graft to the OM1 and 2, and a vein graft to the right coronary artery  . Coronary stent placement 11/13/2007    LCX  . Tigger finger repair     left    Family History  Problem Relation Age  of Onset  . Stroke Father   . Pneumonia Father   . Colon cancer Neg Hx     Allergies  Allergen Reactions  . Codeine Phosphate Other (See Comments)    REACTION: mouth sores    Current Outpatient Prescriptions on File Prior to Visit  Medication Sig Dispense Refill  . amLODipine-valsartan (EXFORGE) 10-320 MG per tablet Take 1 tablet by mouth daily.      [name] aspirin EC 81 MG tablet Take 81 mg by mouth daily.      [name] atorvastatin (LIPITOR) 10 MG tablet Take 10 mg by mouth daily.      . Calcium Citrate-Vitamin D (CITRACAL + D PO) Take 1 tablet by mouth daily.      . Denosumab (XGEVA [place]) Inject into the skin every 30 (thirty) days. Injects "around the first part of each month."      . docusate sodium (COLACE) 50 MG capsule Take by mouth 2 (two) times daily.      [name] donepezil (ARICEPT) 10 MG tablet Take 10 mg by mouth daily.      [name] glimepiride (AMARYL) 4 MG tablet Take 4 mg by mouth  daily before breakfast.      . leuprolide (LUPRON) 3.75 MG injection Inject 3.75 mg into the muscle every 4 (four) months.      . levothyroxine (SYNTHROID, LEVOTHROID) 50 MCG tablet Take 50 mcg by mouth daily.      . metFORMIN (GLUCOPHAGE) 1000 MG tablet Take 500-1,000 mg by mouth 2 (two) times daily with a meal. Take 1,000mg  every morning and take 500mg  every evening.      . Multiple Vitamin (MULTIVITAMIN) tablet Take 1 tablet by mouth daily.        . nitroGLYCERIN (NITROSTAT) 0.4 MG SL tablet Place 0.4 mg under the tongue every 5 (five) minutes as needed. For chest pain      . omega-3 acid ethyl esters (LOVAZA) 1 G capsule Take 1 g by mouth 2 (two) times daily.      . pregabalin (LYRICA) 50 MG capsule Take 50 mg by mouth 2 (two) times daily.      . sertraline (ZOLOFT) 100 MG tablet Take 100 mg by mouth daily.      . sitaGLIPtin (JANUVIA) 100 MG tablet Take 100 mg by mouth daily.      . [DISCONTINUED] chlorthalidone (HYGROTON) 25 MG tablet Take 12.5 mg by mouth daily.        BP 110/60  Pulse 62  Temp 97.6 F  (36.4 C) (Oral)  Resp 20  Wt 191 lb (86.637 kg)  SpO2 98%      Review of Systems  Constitutional: Positive for fatigue. Negative for fever, chills and appetite change.  HENT: Negative for hearing loss, ear pain, congestion, sore throat, trouble swallowing, neck stiffness, dental problem, voice change and tinnitus.   Eyes: Negative for pain, discharge and visual disturbance.  Respiratory: Positive for cough. Negative for chest tightness, wheezing and stridor.   Cardiovascular: Negative for chest pain, palpitations and leg swelling.  Gastrointestinal: Negative for nausea, vomiting, abdominal pain, diarrhea, constipation, blood in stool and abdominal distention.  Genitourinary: Negative for urgency, hematuria, flank pain, discharge, difficulty urinating and genital sores.  Musculoskeletal: Negative for myalgias, back pain, joint swelling, arthralgias and gait problem.  Skin: Negative for rash.  Neurological: Positive for weakness. Negative for dizziness, syncope, speech difficulty, numbness and headaches.  Hematological: Negative for adenopathy. Does not bruise/bleed easily.  Psychiatric/Behavioral: Negative for behavioral problems and dysphoric mood. The patient is not nervous/anxious.        Objective:   Physical Exam  Constitutional: He is oriented to person, place, and time. He appears well-developed.  HENT:  Head: Normocephalic.  Right Ear: External ear normal.  Left Ear: External ear normal.  Eyes: Conjunctivae normal and EOM are normal.  Neck: Normal range of motion.  Cardiovascular: Normal rate, regular rhythm and normal heart sounds.   Pulmonary/Chest: Breath sounds normal.       O2 saturation 98%  Abdominal: Bowel sounds are normal.  Musculoskeletal: Normal range of motion. He exhibits no edema and no tenderness.  Neurological: He is alert and oriented to person, place, and time.  Psychiatric: He has a normal mood and affect. His behavior is normal.           Assessment & Plan:   Viral URI with cough. We'll continue symptomatic treatment Hypertension stable Diabetes stable  Recheck 3 months as scheduled or as needed

## 2012-06-08 NOTE — Patient Instructions (Signed)
Take over-the-counter expectorants and cough medications such as  Mucinex DM.  Call if there is no improvement in 5 to 7 days or if he developed worsening cough, fever, or new symptoms, such as shortness of breath or chest pain. 

## 2012-06-16 ENCOUNTER — Other Ambulatory Visit: Payer: Self-pay | Admitting: Internal Medicine

## 2012-07-06 ENCOUNTER — Ambulatory Visit (INDEPENDENT_AMBULATORY_CARE_PROVIDER_SITE_OTHER): Payer: Medicare Other | Admitting: Surgery

## 2012-07-06 ENCOUNTER — Encounter (INDEPENDENT_AMBULATORY_CARE_PROVIDER_SITE_OTHER): Payer: Self-pay | Admitting: Surgery

## 2012-07-06 VITALS — BP 152/72 | HR 78 | Resp 16 | Ht 68.0 in | Wt 191.0 lb

## 2012-07-06 DIAGNOSIS — D214 Benign neoplasm of connective and other soft tissue of abdomen: Secondary | ICD-10-CM

## 2012-07-06 NOTE — Patient Instructions (Addendum)
Consider surgery for your GIST tumor in your stomach:  [email or web address]

## 2012-07-06 NOTE — Progress Notes (Signed)
Subjective:     Patient ID: [name] [name], male   DOB: Oct 04, 1929, 79 y.o.   MRN: [id number]  HPI  [name] [name]  1929/12/26 [id number]  Patient Care Team: Gordy Savers, MD as PCP - General Marcine Matar, MD as Consulting Physician (Urology) Mardella Layman, MD as Attending Physician (Gastroenterology) Rollene Rotunda, MD as Consulting Physician (Cardiology)  This patient is a 79 y.o.male who presents today for surgical evaluation at the request of Dr. Gerrit Friends.   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 Consideration of laparoscopic resection.   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 Daughter is a judge. Has a nurse in the family as well. He comes today with his son. He has metastatic prostate cancer stable on hormonal therapy for quite a few years. History coronary disease status post bypass and then stenting. Followed by Dr. Rudean Haskell in the past. He thinks that may have switched recently to a different cardiologist. Off aggressive anticoagulation just on aspirin only. Has struggled with some instability walking but improving with a cane on physical therapy.  He was found on workup to have an abnormal mass in his stomach. Biopsy consistent with gastrointestinal stromal tumor. About 4 cm in size. This was detected last year.   He was sent for surgical evaluation. Felt to be resectable. Patient was not interested in an open resection. It was brought up to consider laparoscopic resection. Sent to me consider this.  Scheduled for Dec 2013.  Postponed due to severe cold/URTI.  No feeling better.  Wanted to recheck no changes in recs.   Patient Active Problem List  Diagnosis  . HYPOTHYROIDISM  . DIABETES MELLITUS, TYPE II  . DIABETIC PERIPHERAL NEUROPATHY  . HYPERLIPIDEMIA  . IRON DEFICIENCY  . ANEMIA NEC  . MILD COGNITIVE IMPAIRMENT SO STATED  . HYPERTENSION  . CORONARY ARTERY DISEASE  . INTERNAL HEMORRHOIDS  . GERD  . DIVERTICULOSIS,  COLON  . ANAL SPASM  . ULCER OF ANUS AND RECTUM  . BACK PAIN  . LEG CRAMPS, NOCTURNAL  . SYMPTOM, MALAISE AND FATIGUE NEC  . PROSTATE CANCER, HX OF  . Fatigue  . Memory loss  . Rectal ulcer  . IHD (ischemic heart disease)  . Constipation  . Bruises easily  . History of ventricular fibrillation  . Hypothyroidism  . Prostate cancer  . IBS (irritable bowel syndrome)  . Colitis  . ED (erectile dysfunction)  . OA (osteoarthritis)  . Chest pain  . Depression  . GIST (gastrointestinal stromal tumor), 4cm, proximal stomach    Past Medical History  Diagnosis Date  . Hypertension   . Hyperlipidemia   . Memory loss     mild  . Rectal ulcer   . CAD (coronary artery disease)     S/P CABG x 5 1998; S/P stent LCX 2009  . Constipation   . History of ventricular fibrillation 1998    while on treadmill  . Hypothyroidism   . Prostate cancer   . IBS (irritable bowel syndrome)   . Colitis   . ED (erectile dysfunction)   . Neuromuscular disorder   . Osteoporosis   . Heart attack   . Depression   . Diabetes mellitus     fasting blood sugar 140-200 usually  . GERD (gastroesophageal reflux disease)     Past Surgical History  Procedure Date  . Coronary artery bypass graft 1998    x5, with a left internal mammary to the LAD and diagonal,  vein graft to the OM1 and 2, and a vein graft to the right coronary artery  . Coronary stent placement 11/13/2007    LCX  . Tigger finger repair     left    History   Social History  . Marital Status: Widowed    Spouse Name: N/A    Number of Children: N/A  . Years of Education: N/A   Occupational History  . Not on file.   Social History Main Topics  . Smoking status: Former Games developer  . Smokeless tobacco: Former Neurosurgeon    Quit date: 02/11/1948  . Alcohol Use: No  . Drug Use: No  . Sexually Active: Not on file   Other Topics Concern  . Not on file   Social History Narrative  . No narrative on file    Family History  Problem  Relation Age of Onset  . Stroke Father   . Pneumonia Father   . Colon cancer Neg Hx     Current Outpatient Prescriptions  Medication Sig Dispense Refill  . amLODipine-valsartan (EXFORGE) 10-320 MG per tablet Take 1 tablet by mouth daily.      [name] ascorbic acid (VITAMIN C) 500 MG tablet Take 500 mg by mouth daily.      [name] aspirin EC 81 MG tablet Take 81 mg by mouth daily.      [name] atorvastatin (LIPITOR) 10 MG tablet Take 10 mg by mouth daily.      . Calcium Citrate-Vitamin D (CITRACAL + D PO) Take 1 tablet by mouth daily.      . Denosumab (XGEVA [place]) Inject into the skin every 30 (thirty) days. Injects "around the first part of each month."      . docusate sodium (COLACE) 50 MG capsule Take by mouth 2 (two) times daily.      [name] donepezil (ARICEPT) 10 MG tablet Take 10 mg by mouth daily.      [name] glimepiride (AMARYL) 4 MG tablet Take 4 mg by mouth daily before breakfast.      . guaiFENesin (MUCINEX) 600 MG 12 hr tablet Take 1,200 mg by mouth 2 (two) times daily.      [name] leuprolide (LUPRON) 3.75 MG injection Inject 3.75 mg into the muscle every 4 (four) months.      . levothyroxine (SYNTHROID, LEVOTHROID) 50 MCG tablet Take 50 mcg by mouth daily.      . metFORMIN (GLUCOPHAGE) 1000 MG tablet Take 500-1,000 mg by mouth 2 (two) times daily with a meal. Take 1,000mg  every morning and take 500mg  every evening.      . nitroGLYCERIN (NITROSTAT) 0.4 MG SL tablet Place 0.4 mg under the tongue every 5 (five) minutes as needed. For chest pain      . omega-3 acid ethyl esters (LOVAZA) 1 G capsule Take 1 g by mouth 2 (two) times daily.      . pregabalin (LYRICA) 50 MG capsule Take 50 mg by mouth 2 (two) times daily.      . sertraline (ZOLOFT) 100 MG tablet Take 100 mg by mouth daily.      . sertraline (ZOLOFT) 50 MG tablet TAKE 1 TABLET BY MOUTH DAILY  90 tablet  1  . sitaGLIPtin (JANUVIA) 100 MG tablet Take 100 mg by mouth daily.      . Multiple Vitamin (MULTIVITAMIN) tablet Take 1 tablet by mouth daily.        .  [DISCONTINUED] chlorthalidone (HYGROTON) 25 MG tablet Take 12.5 mg by mouth daily.  Allergies  Allergen Reactions  . Codeine Phosphate Other (See Comments)    REACTION: mouth sores    BP 152/72  Pulse 78  Resp 16  Ht 5\' 8"  (1.727 m)  Wt 191 lb (86.637 kg)  BMI 29.04 kg/m2  No results found.   Review of Systems  Constitutional: Negative for fever, chills and diaphoresis.  HENT: Negative for sore throat, trouble swallowing and neck pain.   Eyes: Negative for photophobia and visual disturbance.  Respiratory: Negative for choking and shortness of breath.   Cardiovascular: Negative for chest pain and palpitations.  Gastrointestinal: Negative for nausea, vomiting, abdominal distention, anal bleeding and rectal pain.  Genitourinary: Negative for dysuria, urgency, difficulty urinating and testicular pain.  Musculoskeletal: Negative for myalgias, arthralgias and gait problem.  Skin: Negative for color change and rash.  Neurological: Negative for dizziness, speech difficulty, weakness and numbness.  Hematological: Negative for adenopathy.  Psychiatric/Behavioral: Negative for hallucinations, confusion and agitation.       Objective:   Physical Exam  Constitutional: He is oriented to person, place, and time. He appears well-developed and well-nourished. No distress.  HENT:  Head: Normocephalic.  Mouth/Throat: Oropharynx is clear and moist. No oropharyngeal exudate.  Eyes: Conjunctivae normal and EOM are normal. Pupils are equal, round, and reactive to light. No scleral icterus.  Neck: Normal range of motion. No tracheal deviation present.  Cardiovascular: Normal rate, normal heart sounds and intact distal pulses.   Pulmonary/Chest: Effort normal. No respiratory distress.  Abdominal: Soft. He exhibits no distension. There is no tenderness. Hernia confirmed negative in the right inguinal area and confirmed negative in the left inguinal area.       No hernias  Musculoskeletal:  Normal range of motion. He exhibits no tenderness.  Neurological: He is alert and oriented to person, place, and time. No cranial nerve deficit. He exhibits normal muscle tone. Coordination normal.  Skin: Skin is warm and dry. No rash noted. He is not diaphoretic.  Psychiatric: He has a normal mood and affect. His behavior is normal.       Assessment:      Assessment:   4 cm stable lesion in proximal stomach. Biopsy consistent with gastrointestinal stromal tumor.   Metastatic prostate cancer disease, stable.   Plan:   Standard of care would be consider resection of this gastrointestinal stroma tumor. Albeit, it has only a few millimeters difference from last year. There is no urgency yet. He is not actively bleeding. He is not obstructed. It is not getting markedly larger. However, it will gradually risk getting him into further trouble.   I discussed the case with the patient and his daughter. He is a reasonable laparoscopic candidate given the fact he has never had any prior abdominal surgery. I think this is for way enough from the cardia that could be a wedge stapled resection. Hopefully just a few hours. Hopefully he just a few days in the hospital. However, with his coronary disease, advanced age and other health issues; he understands that he is at higher risk.    I had discussed the case with his urologist, Dr. Hillis Range. While the patient does have some evidence of metastatic prostate cancer, it is mild and rather stable. He feels this can be well controlled for many many years. Therefore, I think it is reasonable to be relatively aggressive.   I did discuss the procedure with the patient and his daughter:  The anatomy & physiology of the foregut and anti-reflux mechanism was discussed. The  pathophysiology of GIST tumors discussed. Natural history risks without surgery was discussed. The patient's condition will not be treated adequately by medicines and other non-operative  treatments. I feel the risks of no intervention will lead to serious problems that outweigh the operative risks; therefore, I recommended surgery to resect the area of stomach containing the GIST. Need for a thorough workup to rule out the differential diagnosis and plan treatment was explained. I explained laparoscopic techniques with possible need for an open approach.   Risks such as bleeding, infection, abscess, leak, need for further treatment, heart attack, death, and other risks were discussed. I noted a good likelihood this will help address the problem. Goals of post-operative recovery were discussed as well. Possibility that this will not correct all symptoms was explained. Post-operative dysphagia, need for short-term liquid & pureed diet, possible need for medicines to help control symptoms in addition to surgery were discussed. We will work to minimize complications. Educational handouts further explaining the pathology, treatment options, and dysphagia diet was given as well. Questions were answered.   The patient expresses understanding. He wishes to proceed with surgery.   The patient does note his wife passed away this past year and he will be a rough time for him to handle this. Decent exercise tolerance & Cardiac clearance done. I discussed with my referring partner, Dr. Gerrit Friends.

## 2012-07-14 ENCOUNTER — Ambulatory Visit: Payer: Medicare Other | Admitting: Internal Medicine

## 2012-07-16 ENCOUNTER — Encounter: Payer: Self-pay | Admitting: Internal Medicine

## 2012-07-16 ENCOUNTER — Ambulatory Visit (INDEPENDENT_AMBULATORY_CARE_PROVIDER_SITE_OTHER): Payer: Medicare Other | Admitting: Internal Medicine

## 2012-07-16 VITALS — BP 130/60 | HR 58 | Temp 97.8°F | Resp 18 | Wt 194.0 lb

## 2012-07-16 DIAGNOSIS — K219 Gastro-esophageal reflux disease without esophagitis: Secondary | ICD-10-CM

## 2012-07-16 DIAGNOSIS — Z8546 Personal history of malignant neoplasm of prostate: Secondary | ICD-10-CM

## 2012-07-16 DIAGNOSIS — I10 Essential (primary) hypertension: Secondary | ICD-10-CM

## 2012-07-16 DIAGNOSIS — D214 Benign neoplasm of connective and other soft tissue of abdomen: Secondary | ICD-10-CM

## 2012-07-16 DIAGNOSIS — E119 Type 2 diabetes mellitus without complications: Secondary | ICD-10-CM

## 2012-07-16 NOTE — Progress Notes (Signed)
Subjective:    Patient ID: [name] [name], male    DOB: [birth year], 79 y.o.   MRN: [id number]  HPI  79 year old patient who has type 2 diabetes. He is on triple oral medication. More recently he has noted higher blood sugars. His last hemoglobin A1c was 7.2.  He is scheduled for laparoscopic resection of a proximal gastric GIST next month. He has treated hypertension In general he has done well he does have a history of metastatic prostate cancer and has complained of some anterior chest wall pain with movement. This began after or during a URI recently associated with considerable coughing. URI symptoms have resolved  Wt Readings from Last 3 Encounters:  07/16/12 194 lb (87.998 kg)  07/06/12 191 lb (86.637 kg)  06/08/12 191 lb (86.637 kg)   Past Medical History  Diagnosis Date  . Hypertension   . Hyperlipidemia   . Memory loss     mild  . Rectal ulcer   . CAD (coronary artery disease)     S/P CABG x 5 1998; S/P stent LCX 2009  . Constipation   . History of ventricular fibrillation 1998    while on treadmill  . Hypothyroidism   . Prostate cancer   . IBS (irritable bowel syndrome)   . Colitis   . ED (erectile dysfunction)   . Neuromuscular disorder   . Osteoporosis   . Heart attack   . Depression   . Diabetes mellitus     fasting blood sugar 140-200 usually  . GERD (gastroesophageal reflux disease)     History   Social History  . Marital Status: Widowed    Spouse Name: N/A    Number of Children: N/A  . Years of Education: N/A   Occupational History  . Not on file.   Social History Main Topics  . Smoking status: Former Games developer  . Smokeless tobacco: Former Neurosurgeon    Quit date: 02/11/1948  . Alcohol Use: No  . Drug Use: No  . Sexually Active: Not on file   Other Topics Concern  . Not on file   Social History Narrative  . No narrative on file    Past Surgical History  Procedure Date  . Coronary artery bypass graft 1998    x5, with a left internal mammary to  the LAD and diagonal, vein graft to the OM1 and 2, and a vein graft to the right coronary artery  . Coronary stent placement 11/13/2007    LCX  . Tigger finger repair     left    Family History  Problem Relation Age of Onset  . Stroke Father   . Pneumonia Father   . Colon cancer Neg Hx     Allergies  Allergen Reactions  . Codeine Phosphate Other (See Comments)    REACTION: mouth sores    Current Outpatient Prescriptions on File Prior to Visit  Medication Sig Dispense Refill  . amLODipine-valsartan (EXFORGE) 10-320 MG per tablet Take 1 tablet by mouth daily.      [name] ascorbic acid (VITAMIN C) 500 MG tablet Take 500 mg by mouth daily.      [name] aspirin EC 81 MG tablet Take 81 mg by mouth daily.      [name] atorvastatin (LIPITOR) 10 MG tablet Take 10 mg by mouth daily.      . Calcium Citrate-Vitamin D (CITRACAL + D PO) Take 1 tablet by mouth daily.      . Denosumab (XGEVA [place]) Inject into the skin every  30 (thirty) days. Injects "around the first part of each month."      . docusate sodium (COLACE) 50 MG capsule Take by mouth 2 (two) times daily.      [name] donepezil (ARICEPT) 10 MG tablet Take 10 mg by mouth daily.      [name] glimepiride (AMARYL) 4 MG tablet Take 4 mg by mouth daily before breakfast.      . guaiFENesin (MUCINEX) 600 MG 12 hr tablet Take 1,200 mg by mouth 2 (two) times daily.      [name] leuprolide (LUPRON) 3.75 MG injection Inject 3.75 mg into the muscle every 4 (four) months.      . levothyroxine (SYNTHROID, LEVOTHROID) 50 MCG tablet Take 50 mcg by mouth daily.      . metFORMIN (GLUCOPHAGE) 1000 MG tablet Take 500-1,000 mg by mouth 2 (two) times daily with a meal. Take 1,000mg  every morning and take 500mg  every evening.      . nitroGLYCERIN (NITROSTAT) 0.4 MG SL tablet Place 0.4 mg under the tongue every 5 (five) minutes as needed. For chest pain      . omega-3 acid ethyl esters (LOVAZA) 1 G capsule Take 1 g by mouth 2 (two) times daily.      . pregabalin (LYRICA) 50 MG capsule Take 50 mg  by mouth 2 (two) times daily.      . sertraline (ZOLOFT) 50 MG tablet TAKE 1 TABLET BY MOUTH DAILY  90 tablet  1  . sitaGLIPtin (JANUVIA) 100 MG tablet Take 100 mg by mouth daily.      . [DISCONTINUED] chlorthalidone (HYGROTON) 25 MG tablet Take 12.5 mg by mouth daily.        BP 130/60  Pulse 58  Temp 97.8 F (36.6 C) (Oral)  Resp 18  Wt 194 lb (87.998 kg)  SpO2 97%    Review of Systems  Constitutional: Negative for fever, chills, appetite change and fatigue.  HENT: Negative for hearing loss, ear pain, congestion, sore throat, trouble swallowing, neck stiffness, dental problem, voice change and tinnitus.   Eyes: Negative for pain, discharge and visual disturbance.  Respiratory: Negative for cough, chest tightness, wheezing and stridor.   Cardiovascular: Positive for chest pain. Negative for palpitations and leg swelling.  Gastrointestinal: Negative for nausea, vomiting, abdominal pain, diarrhea, constipation, blood in stool and abdominal distention.  Genitourinary: Negative for urgency, hematuria, flank pain, discharge, difficulty urinating and genital sores.  Musculoskeletal: Negative for myalgias, back pain, joint swelling, arthralgias and gait problem.  Skin: Negative for rash.  Neurological: Negative for dizziness, syncope, speech difficulty, weakness, numbness and headaches.  Hematological: Negative for adenopathy. Does not bruise/bleed easily.  Psychiatric/Behavioral: Positive for decreased concentration. Negative for behavioral problems and dysphoric mood. The patient is not nervous/anxious.        Objective:   Physical Exam  Constitutional: He is oriented to person, place, and time. He appears well-developed.       Weight 194 Blood pressure well controlled  HENT:  Head: Normocephalic.  Right Ear: External ear normal.  Left Ear: External ear normal.  Eyes: Conjunctivae normal and EOM are normal.  Neck: Normal range of motion.  Cardiovascular: Normal rate and normal  heart sounds.   Pulmonary/Chest: Breath sounds normal.  Abdominal: Bowel sounds are normal.  Musculoskeletal: Normal range of motion. He exhibits no edema and no tenderness.  Neurological: He is alert and oriented to person, place, and time.  Psychiatric: He has a normal mood and affect. His behavior is normal.  Assessment & Plan:   Diabetes mellitus. Weight loss better diet exercise regimen all encouraged  Anterior chest wall pain. Suspect this is related to recent URI and coughing. Patient does have a history of metastatic prostate cancer. May require radiologic evaluation if persists or worsens Hypertension stable

## 2012-07-16 NOTE — Patient Instructions (Addendum)
Please check your hemoglobin A1c every 3 months  Limit your sodium (Salt) intake  You need to lose weight.  Consider a lower calorie diet and regular exercise.  Call if chest pain worsens

## 2012-07-19 ENCOUNTER — Other Ambulatory Visit: Payer: Self-pay | Admitting: Internal Medicine

## 2012-07-21 ENCOUNTER — Encounter: Payer: Self-pay | Admitting: Internal Medicine

## 2012-07-29 ENCOUNTER — Encounter: Payer: Self-pay | Admitting: Internal Medicine

## 2012-08-10 ENCOUNTER — Encounter (HOSPITAL_COMMUNITY): Payer: Self-pay | Admitting: Pharmacy Technician

## 2012-08-12 ENCOUNTER — Encounter (HOSPITAL_COMMUNITY): Payer: Self-pay

## 2012-08-12 ENCOUNTER — Encounter (HOSPITAL_COMMUNITY)
Admission: RE | Admit: 2012-08-12 | Discharge: 2012-08-12 | Disposition: A | Discharge status: Home / Self Care | Payer: Medicare Other | Source: Ambulatory Visit | Attending: Surgery | Admitting: Surgery

## 2012-08-12 HISTORY — DX: Pain, unspecified: R52

## 2012-08-12 HISTORY — DX: Unspecified dementia, unspecified severity, without behavioral disturbance, psychotic disturbance, mood disturbance, and anxiety: F03.90

## 2012-08-12 LAB — BASIC METABOLIC PANEL
BUN: 24 mg/dL — ABNORMAL HIGH (ref 6–23)
CO2: 26 mEq/L (ref 19–32)
Calcium: 9.4 mg/dL (ref 8.4–10.5)
GFR calc non Af Amer: 48 mL/min — ABNORMAL LOW (ref >90)
Glucose, Bld: 335 mg/dL — ABNORMAL HIGH (ref 70–99)

## 2012-08-12 LAB — CBC
HCT: 34.1 % — ABNORMAL LOW (ref 39.0–52.0)
Hemoglobin: 11.6 g/dL — ABNORMAL LOW (ref 13.0–17.0)
MCH: 30.8 pg (ref 26.0–34.0)
MCHC: 34 g/dL (ref 30.0–36.0)
RBC: 3.77 MIL/uL — ABNORMAL LOW (ref 4.22–5.81)

## 2012-08-12 NOTE — Pre-Procedure Instructions (Signed)
PREOP CBC, BMET WERE DONE TODAY AT [facility] AS PER ANESTHESIOLOGIST'S GUIDELINES. PT HAS EKG AND [facility] OFFICE NOTE IN EPIC FROM 04/14/12. PT HAS CXR REPORT IN EPIC FROM 08/30/11.

## 2012-08-12 NOTE — Pre-Procedure Instructions (Signed)
PT IS DIABETIC --PREOP CMET RESULTS IN EPIC - GLUCOSE [id number] -REPORT WAS REVIEWED IN EPIC BY DR. Michaell Cowing.

## 2012-08-12 NOTE — Patient Instructions (Signed)
YOUR SURGERY IS SCHEDULED AT [facility]  ON:  Tuesday  2/18  REPORT TO [facility] AT: 5:30 AM      PHONE # FOR SHORT STAY IS [phone]  DO NOT EAT OR DRINK ANYTHING AFTER MIDNIGHT THE NIGHT BEFORE YOUR SURGERY.  YOU MAY BRUSH YOUR TEETH, RINSE OUT YOUR MOUTH--BUT NO WATER, NO FOOD, NO CHEWING GUM, NO MINTS, NO CANDIES, NO CHEWING TOBACCO.  PLEASE TAKE THE FOLLOWING MEDICATIONS THE AM OF YOUR SURGERY WITH A FEW SIPS OF WATER:    LIPITOR, ARICEPT, SYNTHROID, LYRICA    IF YOU ARE DIABETIC:  DO NOT TAKE ANY DIABETIC MEDICATIONS THE AM OF YOUR SURGERY.  IF YOU TAKE INSULIN IN THE EVENINGS--PLEASE ONLY TAKE 1/2 NORMAL EVENING DOSE THE NIGHT BEFORE YOUR SURGERY.  NO INSULIN THE AM OF YOUR SURGERY.    DO NOT BRING VALUABLES, MONEY, CREDIT CARDS.  DO NOT WEAR JEWELRY, MAKE-UP, NAIL POLISH AND NO METAL PINS OR CLIPS IN YOUR HAIR. CONTACT LENS, DENTURES / PARTIALS, GLASSES SHOULD NOT BE WORN TO SURGERY AND IN MOST CASES-HEARING AIDS WILL NEED TO BE REMOVED.  BRING YOUR GLASSES CASE, ANY EQUIPMENT NEEDED FOR YOUR CONTACT LENS. FOR PATIENTS ADMITTED TO THE HOSPITAL--CHECK OUT TIME THE DAY OF DISCHARGE IS 11:00 AM.  ALL INPATIENT ROOMS ARE PRIVATE - WITH BATHROOM, TELEPHONE, TELEVISION AND WIFI INTERNET.  IF YOU ARE BEING DISCHARGED THE SAME DAY OF YOUR SURGERY--YOU CAN NOT DRIVE YOURSELF HOME--AND SHOULD NOT GO HOME ALONE BY TAXI OR BUS.  NO DRIVING OR OPERATING MACHINERY FOR 24 HOURS FOLLOWING ANESTHESIA / PAIN MEDICATIONS.  PLEASE MAKE ARRANGEMENTS FOR SOMEONE TO BE WITH YOU AT HOME THE FIRST 24 HOURS AFTER SURGERY. RESPONSIBLE DRIVER'S NAME   SHERRY ALLOWAY                                               PHONE #   [phone]                            PLEASE READ OVER ANY  FACT SHEETS THAT YOU WERE GIVEN: MRSA INFORMATION, BLOOD TRANSFUSION INFORMATION, INCENTIVE SPIROMETER INFORMATION. FAILURE TO FOLLOW THESE INSTRUCTIONS MAY RESULT IN THE CANCELLATION OF YOUR SURGERY.    PATIENT SIGNATURE_________________________________

## 2012-08-17 MED ORDER — DEXTROSE 5 % IV SOLN
2.0000 g | INTRAVENOUS | Status: DC
  Filled 2012-08-17 (×2): qty 2

## 2012-08-17 NOTE — Anesthesia Preprocedure Evaluation (Addendum)
Anesthesia Evaluation  Patient identified by MRN, date of birth, ID band Patient awake    Reviewed: Allergy & Precautions, H&P , NPO status , Patient's Chart, lab work & pertinent test results  Airway Mallampati: II TM Distance: >3 FB Neck ROM: Full    Dental  (+) Dental Advisory Given   Pulmonary neg pulmonary ROS,  breath sounds clear to auscultation        Cardiovascular hypertension, Pt. on medications + CAD, + Past MI, + Cardiac Stents and + CABG Rhythm:Regular Rate:Normal     Neuro/Psych PSYCHIATRIC DISORDERS Depression negative neurological ROS     GI/Hepatic Neg liver ROS, PUD, GERD-  Medicated,  Endo/Other  diabetes, Poorly Controlled, Type 2, Oral Hypoglycemic AgentsHypothyroidism   Renal/GU Renal InsufficiencyRenal disease     Musculoskeletal negative musculoskeletal ROS (+)   Abdominal   Peds  Hematology  (+) Blood dyscrasia, anemia ,   Anesthesia Other Findings   Reproductive/Obstetrics                        Anesthesia Physical Anesthesia Plan  ASA: III  Anesthesia Plan: General   Post-op Pain Management:    Induction: Intravenous  Airway Management Planned: Oral ETT  Additional Equipment:   Intra-op Plan:   Post-operative Plan: Extubation in OR  Informed Consent: I have reviewed the patients History and Physical, chart, labs and discussed the procedure including the risks, benefits and alternatives for the proposed anesthesia with the patient or authorized representative who has indicated his/her understanding and acceptance.   Dental advisory given  Plan Discussed with: CRNA  Anesthesia Plan Comments:       Anesthesia Quick Evaluation

## 2012-08-18 ENCOUNTER — Ambulatory Visit (HOSPITAL_COMMUNITY): Payer: Medicare Other | Admitting: Anesthesiology

## 2012-08-18 ENCOUNTER — Observation Stay (HOSPITAL_COMMUNITY)
Admission: RE | Admit: 2012-08-18 | Discharge: 2012-08-19 | DRG: 983 | Disposition: A | Discharge status: Home / Self Care | Payer: Medicare Other | Source: Ambulatory Visit | Attending: Surgery | Admitting: Surgery

## 2012-08-18 ENCOUNTER — Encounter (HOSPITAL_COMMUNITY): Payer: Self-pay | Admitting: *Deleted

## 2012-08-18 ENCOUNTER — Encounter (HOSPITAL_COMMUNITY)
Admission: RE | Disposition: A | Discharge status: Home / Self Care | Payer: Self-pay | Source: Ambulatory Visit | Attending: Surgery

## 2012-08-18 ENCOUNTER — Encounter (HOSPITAL_COMMUNITY): Payer: Self-pay | Admitting: Anesthesiology

## 2012-08-18 DIAGNOSIS — Z79899 Other long term (current) drug therapy: Secondary | ICD-10-CM | POA: Insufficient documentation

## 2012-08-18 DIAGNOSIS — F329 Major depressive disorder, single episode, unspecified: Secondary | ICD-10-CM | POA: Diagnosis present

## 2012-08-18 DIAGNOSIS — D214 Benign neoplasm of connective and other soft tissue of abdomen: Secondary | ICD-10-CM

## 2012-08-18 DIAGNOSIS — K589 Irritable bowel syndrome without diarrhea: Secondary | ICD-10-CM | POA: Diagnosis present

## 2012-08-18 DIAGNOSIS — Z87891 Personal history of nicotine dependence: Secondary | ICD-10-CM

## 2012-08-18 DIAGNOSIS — I10 Essential (primary) hypertension: Secondary | ICD-10-CM | POA: Diagnosis present

## 2012-08-18 DIAGNOSIS — E0842 Diabetes mellitus due to underlying condition with diabetic polyneuropathy: Secondary | ICD-10-CM | POA: Diagnosis present

## 2012-08-18 DIAGNOSIS — K5900 Constipation, unspecified: Secondary | ICD-10-CM | POA: Diagnosis present

## 2012-08-18 DIAGNOSIS — F0390 Unspecified dementia without behavioral disturbance: Secondary | ICD-10-CM | POA: Diagnosis present

## 2012-08-18 DIAGNOSIS — K219 Gastro-esophageal reflux disease without esophagitis: Secondary | ICD-10-CM | POA: Diagnosis present

## 2012-08-18 DIAGNOSIS — I252 Old myocardial infarction: Secondary | ICD-10-CM

## 2012-08-18 DIAGNOSIS — K279 Peptic ulcer, site unspecified, unspecified as acute or chronic, without hemorrhage or perforation: Secondary | ICD-10-CM | POA: Diagnosis present

## 2012-08-18 DIAGNOSIS — R413 Other amnesia: Secondary | ICD-10-CM | POA: Diagnosis present

## 2012-08-18 DIAGNOSIS — E039 Hypothyroidism, unspecified: Secondary | ICD-10-CM | POA: Diagnosis present

## 2012-08-18 DIAGNOSIS — E119 Type 2 diabetes mellitus without complications: Secondary | ICD-10-CM | POA: Diagnosis present

## 2012-08-18 DIAGNOSIS — Z951 Presence of aortocoronary bypass graft: Secondary | ICD-10-CM

## 2012-08-18 DIAGNOSIS — I2510 Atherosclerotic heart disease of native coronary artery without angina pectoris: Secondary | ICD-10-CM | POA: Diagnosis present

## 2012-08-18 DIAGNOSIS — M810 Age-related osteoporosis without current pathological fracture: Secondary | ICD-10-CM | POA: Diagnosis present

## 2012-08-18 DIAGNOSIS — K5909 Other constipation: Secondary | ICD-10-CM | POA: Diagnosis present

## 2012-08-18 DIAGNOSIS — Z8546 Personal history of malignant neoplasm of prostate: Secondary | ICD-10-CM

## 2012-08-18 DIAGNOSIS — E785 Hyperlipidemia, unspecified: Secondary | ICD-10-CM | POA: Diagnosis present

## 2012-08-18 DIAGNOSIS — F3289 Other specified depressive episodes: Secondary | ICD-10-CM | POA: Diagnosis present

## 2012-08-18 HISTORY — PX: LAPAROSCOPIC GASTRECTOMY: SHX5894

## 2012-08-18 LAB — GLUCOSE, CAPILLARY
Glucose-Capillary: 147 mg/dL — ABNORMAL HIGH (ref 70–99)
Glucose-Capillary: 169 mg/dL — ABNORMAL HIGH (ref 70–99)

## 2012-08-18 LAB — TYPE AND SCREEN: ABO/RH(D): AB NEG

## 2012-08-18 SURGERY — GASTRECTOMY, LAPAROSCOPIC
Anesthesia: General | Wound class: Clean Contaminated

## 2012-08-18 MED ORDER — CHLORHEXIDINE GLUCONATE 4 % EX LIQD
1.0000 "application " | Freq: Once | CUTANEOUS | Status: DC
  Filled 2012-08-18: qty 15

## 2012-08-18 MED ORDER — LEVOTHYROXINE SODIUM 50 MCG PO TABS
50.0000 ug | ORAL_TABLET | Freq: Every morning | ORAL | Status: DC
  Administered 2012-08-19: 50 ug via ORAL
  Filled 2012-08-18: qty 1

## 2012-08-18 MED ORDER — SODIUM CHLORIDE 0.9 % IJ SOLN
3.0000 mL | Freq: Two times a day (BID) | INTRAMUSCULAR | Status: DC
  Administered 2012-08-18 – 2012-08-19 (×3): 3 mL via INTRAVENOUS

## 2012-08-18 MED ORDER — PROMETHAZINE HCL 25 MG RE SUPP: 25.0000 mg | Freq: Four times a day (QID) | RECTAL | Status: DC | PRN

## 2012-08-18 MED ORDER — MAGIC MOUTHWASH
15.0000 mL | Freq: Four times a day (QID) | ORAL | Status: DC | PRN
  Filled 2012-08-18: qty 15

## 2012-08-18 MED ORDER — SODIUM CHLORIDE 0.9 % IJ SOLN: 3.0000 mL | INTRAMUSCULAR | Status: DC | PRN

## 2012-08-18 MED ORDER — ONDANSETRON HCL 4 MG PO TABS: 4.0000 mg | ORAL_TABLET | Freq: Four times a day (QID) | ORAL | Status: DC | PRN

## 2012-08-18 MED ORDER — PREGABALIN 50 MG PO CAPS
50.0000 mg | ORAL_CAPSULE | Freq: Two times a day (BID) | ORAL | Status: DC
  Administered 2012-08-18 – 2012-08-19 (×2): 50 mg via ORAL
  Filled 2012-08-18 (×2): qty 1

## 2012-08-18 MED ORDER — PANTOPRAZOLE SODIUM 40 MG PO TBEC
40.0000 mg | DELAYED_RELEASE_TABLET | Freq: Two times a day (BID) | ORAL | Status: DC
  Administered 2012-08-18 – 2012-08-19 (×2): 40 mg via ORAL
  Filled 2012-08-18 (×3): qty 1

## 2012-08-18 MED ORDER — PROMETHAZINE HCL 25 MG/ML IJ SOLN: 6.2500 mg | INTRAMUSCULAR | Status: DC | PRN

## 2012-08-18 MED ORDER — INSULIN ASPART 100 UNIT/ML ~~LOC~~ SOLN: 0.0000 [IU] | Freq: Every day | SUBCUTANEOUS | Status: DC

## 2012-08-18 MED ORDER — DEXTROSE 5 % IV SOLN
2.0000 g | Freq: Three times a day (TID) | INTRAVENOUS | Status: AC
  Administered 2012-08-18 (×2): 2 g via INTRAVENOUS
  Filled 2012-08-18 (×2): qty 2

## 2012-08-18 MED ORDER — PHENOL 1.4 % MT LIQD: 2.0000 | OROMUCOSAL | Status: DC | PRN

## 2012-08-18 MED ORDER — AMLODIPINE BESYLATE-VALSARTAN 10-320 MG PO TABS: 1.0000 | ORAL_TABLET | Freq: Every morning | ORAL | Status: DC

## 2012-08-18 MED ORDER — ALVIMOPAN 12 MG PO CAPS
12.0000 mg | ORAL_CAPSULE | Freq: Once | ORAL | Status: AC
  Administered 2012-08-18: 12 mg via ORAL
  Filled 2012-08-18: qty 1

## 2012-08-18 MED ORDER — FENTANYL CITRATE 0.05 MG/ML IJ SOLN
INTRAMUSCULAR | Status: DC | PRN
  Administered 2012-08-18: 100 ug via INTRAVENOUS
  Administered 2012-08-18: 50 ug via INTRAVENOUS
  Administered 2012-08-18: 100 ug via INTRAVENOUS

## 2012-08-18 MED ORDER — HYDROMORPHONE HCL PF 1 MG/ML IJ SOLN: 0.5000 mg | INTRAMUSCULAR | Status: DC | PRN

## 2012-08-18 MED ORDER — DONEPEZIL HCL 10 MG PO TABS
10.0000 mg | ORAL_TABLET | Freq: Every day | ORAL | Status: DC
  Administered 2012-08-19: 10 mg via ORAL
  Filled 2012-08-18: qty 1

## 2012-08-18 MED ORDER — OXYCODONE HCL 5 MG PO TABS: 5.0000 mg | ORAL_TABLET | Freq: Once | ORAL | Status: DC | PRN

## 2012-08-18 MED ORDER — ONDANSETRON 8 MG/NS 50 ML IVPB
8.0000 mg | Freq: Four times a day (QID) | INTRAVENOUS | Status: DC | PRN
  Filled 2012-08-18: qty 8

## 2012-08-18 MED ORDER — ACETAMINOPHEN 10 MG/ML IV SOLN: 1000.0000 mg | Freq: Once | INTRAVENOUS | Status: DC | PRN

## 2012-08-18 MED ORDER — AMLODIPINE BESYLATE 10 MG PO TABS
10.0000 mg | ORAL_TABLET | Freq: Once | ORAL | Status: AC
  Administered 2012-08-18: 10 mg via ORAL
  Filled 2012-08-18: qty 1

## 2012-08-18 MED ORDER — CHLORHEXIDINE GLUCONATE 4 % EX LIQD
1.0000 | Freq: Once | CUTANEOUS | Status: DC
Start: 2012-08-19 — End: 2012-08-18
  Filled 2012-08-18: qty 15

## 2012-08-18 MED ORDER — ALBUTEROL SULFATE (5 MG/ML) 0.5% IN NEBU: 2.5000 mg | INHALATION_SOLUTION | Freq: Four times a day (QID) | RESPIRATORY_TRACT | Status: DC | PRN

## 2012-08-18 MED ORDER — IRBESARTAN 300 MG PO TABS
300.0000 mg | ORAL_TABLET | Freq: Every day | ORAL | Status: DC
  Administered 2012-08-18 – 2012-08-19 (×2): 300 mg via ORAL
  Filled 2012-08-18 (×2): qty 1

## 2012-08-18 MED ORDER — BUPIVACAINE-EPINEPHRINE 0.25% -1:200000 IJ SOLN
INTRAMUSCULAR | Status: AC
  Filled 2012-08-18: qty 1

## 2012-08-18 MED ORDER — METOPROLOL TARTRATE 12.5 MG HALF TABLET
12.5000 mg | ORAL_TABLET | Freq: Two times a day (BID) | ORAL | Status: DC | PRN
  Administered 2012-08-18: 12.5 mg via ORAL
  Filled 2012-08-18: qty 1

## 2012-08-18 MED ORDER — MEPERIDINE HCL 50 MG/ML IJ SOLN: 6.2500 mg | INTRAMUSCULAR | Status: DC | PRN

## 2012-08-18 MED ORDER — PROMETHAZINE HCL 25 MG/ML IJ SOLN: 12.5000 mg | Freq: Four times a day (QID) | INTRAMUSCULAR | Status: DC | PRN

## 2012-08-18 MED ORDER — MAGNESIUM HYDROXIDE 400 MG/5ML PO SUSP: 30.0000 mL | Freq: Two times a day (BID) | ORAL | Status: DC | PRN

## 2012-08-18 MED ORDER — AMLODIPINE BESYLATE 10 MG PO TABS
10.0000 mg | ORAL_TABLET | Freq: Every day | ORAL | Status: DC
  Administered 2012-08-18 – 2012-08-19 (×2): 10 mg via ORAL
  Filled 2012-08-18 (×2): qty 1

## 2012-08-18 MED ORDER — NITROGLYCERIN 0.4 MG SL SUBL: 0.4000 mg | SUBLINGUAL_TABLET | SUBLINGUAL | Status: DC | PRN

## 2012-08-18 MED ORDER — ACETAMINOPHEN 10 MG/ML IV SOLN
INTRAVENOUS | Status: AC
  Filled 2012-08-18: qty 100

## 2012-08-18 MED ORDER — HYDROMORPHONE HCL PF 1 MG/ML IJ SOLN: 0.2500 mg | INTRAMUSCULAR | Status: DC | PRN

## 2012-08-18 MED ORDER — BISACODYL 10 MG RE SUPP: 10.0000 mg | Freq: Two times a day (BID) | RECTAL | Status: DC | PRN

## 2012-08-18 MED ORDER — HALOPERIDOL LACTATE 5 MG/ML IJ SOLN: 2.0000 mg | Freq: Four times a day (QID) | INTRAMUSCULAR | Status: DC | PRN

## 2012-08-18 MED ORDER — PROMETHAZINE HCL 12.5 MG PO TABS: 12.5000 mg | ORAL_TABLET | Freq: Four times a day (QID) | ORAL | Status: DC | PRN

## 2012-08-18 MED ORDER — ATORVASTATIN CALCIUM 10 MG PO TABS
10.0000 mg | ORAL_TABLET | Freq: Every morning | ORAL | Status: DC
Start: 2012-08-19 — End: 2012-08-19
  Administered 2012-08-19: 10 mg via ORAL
  Filled 2012-08-18: qty 1

## 2012-08-18 MED ORDER — SERTRALINE HCL 50 MG PO TABS
50.0000 mg | ORAL_TABLET | Freq: Every day | ORAL | Status: DC
  Administered 2012-08-18: 50 mg via ORAL
  Filled 2012-08-18 (×2): qty 1

## 2012-08-18 MED ORDER — NEOSTIGMINE METHYLSULFATE 1 MG/ML IJ SOLN
INTRAMUSCULAR | Status: DC | PRN
  Administered 2012-08-18: 3 mg via INTRAVENOUS

## 2012-08-18 MED ORDER — DEXTROSE 5 % IV SOLN
2.0000 g | INTRAVENOUS | Status: DC | PRN
  Administered 2012-08-18: 2 g via INTRAVENOUS

## 2012-08-18 MED ORDER — LACTATED RINGERS IV SOLN
INTRAVENOUS | Status: DC | PRN
  Administered 2012-08-18 (×2): via INTRAVENOUS

## 2012-08-18 MED ORDER — LACTATED RINGERS IV BOLUS (SEPSIS): 1000.0000 mL | Freq: Three times a day (TID) | INTRAVENOUS | Status: DC | PRN

## 2012-08-18 MED ORDER — ONDANSETRON HCL 4 MG/2ML IJ SOLN
INTRAMUSCULAR | Status: DC | PRN
  Administered 2012-08-18: 2 mg via INTRAVENOUS

## 2012-08-18 MED ORDER — LACTATED RINGERS IV SOLN
INTRAVENOUS | Status: DC
  Administered 2012-08-18: 10:00:00 via INTRAVENOUS
  Administered 2012-08-18: 1000 mL via INTRAVENOUS

## 2012-08-18 MED ORDER — INSULIN ASPART 100 UNIT/ML ~~LOC~~ SOLN
0.0000 [IU] | Freq: Three times a day (TID) | SUBCUTANEOUS | Status: DC
  Administered 2012-08-18: 3 [IU] via SUBCUTANEOUS
  Administered 2012-08-18: 2 [IU] via SUBCUTANEOUS
  Administered 2012-08-19 (×2): 5 [IU] via SUBCUTANEOUS

## 2012-08-18 MED ORDER — LINAGLIPTIN 5 MG PO TABS
5.0000 mg | ORAL_TABLET | Freq: Every day | ORAL | Status: DC
  Administered 2012-08-18 – 2012-08-19 (×2): 5 mg via ORAL
  Filled 2012-08-18 (×2): qty 1

## 2012-08-18 MED ORDER — MENTHOL 3 MG MT LOZG: 1.0000 | LOZENGE | OROMUCOSAL | Status: DC | PRN

## 2012-08-18 MED ORDER — LIP MEDEX EX OINT
1.0000 "application " | TOPICAL_OINTMENT | Freq: Two times a day (BID) | CUTANEOUS | Status: DC
  Administered 2012-08-18 – 2012-08-19 (×2): 1 via TOPICAL
  Filled 2012-08-18: qty 7

## 2012-08-18 MED ORDER — OXYCODONE HCL 5 MG PO TABS: 5.0000 mg | ORAL_TABLET | Freq: Four times a day (QID) | ORAL | Status: DC | PRN

## 2012-08-18 MED ORDER — OXYCODONE HCL 5 MG/5ML PO SOLN
5.0000 mg | Freq: Once | ORAL | Status: DC | PRN
  Filled 2012-08-18: qty 5

## 2012-08-18 MED ORDER — SACCHAROMYCES BOULARDII 250 MG PO CAPS
250.0000 mg | ORAL_CAPSULE | Freq: Two times a day (BID) | ORAL | Status: DC
  Administered 2012-08-18 – 2012-08-19 (×3): 250 mg via ORAL
  Filled 2012-08-18 (×4): qty 1

## 2012-08-18 MED ORDER — SODIUM CHLORIDE 0.9 % IV SOLN: 250.0000 mL | INTRAVENOUS | Status: DC | PRN

## 2012-08-18 MED ORDER — LACTATED RINGERS IV SOLN
INTRAVENOUS | Status: DC
  Administered 2012-08-18: 16:00:00 via INTRAVENOUS

## 2012-08-18 MED ORDER — PROPOFOL 10 MG/ML IV EMUL
INTRAVENOUS | Status: DC | PRN
  Administered 2012-08-18: 150 mg via INTRAVENOUS

## 2012-08-18 MED ORDER — HYDROMORPHONE HCL PF 1 MG/ML IJ SOLN
INTRAMUSCULAR | Status: DC | PRN
  Administered 2012-08-18 (×3): 0.5 mg via INTRAVENOUS

## 2012-08-18 MED ORDER — ACETAMINOPHEN 500 MG PO TABS
1000.0000 mg | ORAL_TABLET | Freq: Three times a day (TID) | ORAL | Status: DC
  Administered 2012-08-18 – 2012-08-19 (×3): 1000 mg via ORAL
  Filled 2012-08-18: qty 2
  Filled 2012-08-18: qty 1
  Filled 2012-08-18 (×3): qty 2

## 2012-08-18 MED ORDER — CISATRACURIUM BESYLATE (PF) 10 MG/5ML IV SOLN
INTRAVENOUS | Status: DC | PRN
  Administered 2012-08-18: 7 mg via INTRAVENOUS

## 2012-08-18 MED ORDER — OXYBUTYNIN CHLORIDE ER 10 MG PO TB24
10.0000 mg | ORAL_TABLET | Freq: Every day | ORAL | Status: DC
Start: 2012-08-18 — End: 2012-08-19
  Administered 2012-08-18 – 2012-08-19 (×2): 10 mg via ORAL
  Filled 2012-08-18 (×2): qty 1

## 2012-08-18 MED ORDER — HEPARIN SODIUM (PORCINE) 5000 UNIT/ML IJ SOLN
5000.0000 [IU] | Freq: Three times a day (TID) | INTRAMUSCULAR | Status: DC
  Administered 2012-08-19 (×2): 5000 [IU] via SUBCUTANEOUS
  Filled 2012-08-18 (×4): qty 1

## 2012-08-18 MED ORDER — SUCCINYLCHOLINE CHLORIDE 20 MG/ML IJ SOLN
INTRAMUSCULAR | Status: DC | PRN
  Administered 2012-08-18: 100 mg via INTRAVENOUS

## 2012-08-18 MED ORDER — BUPIVACAINE-EPINEPHRINE (PF) 0.25% -1:200000 IJ SOLN
INTRAMUSCULAR | Status: DC | PRN
  Administered 2012-08-18: 50 mL

## 2012-08-18 MED ORDER — DIPHENHYDRAMINE HCL 50 MG/ML IJ SOLN: 12.5000 mg | Freq: Four times a day (QID) | INTRAMUSCULAR | Status: DC | PRN

## 2012-08-18 MED ORDER — LIDOCAINE HCL (CARDIAC) 20 MG/ML IV SOLN
INTRAVENOUS | Status: DC | PRN
  Administered 2012-08-18: 20 mg via INTRAVENOUS

## 2012-08-18 MED ORDER — INSULIN ASPART 100 UNIT/ML ~~LOC~~ SOLN
SUBCUTANEOUS | Status: AC
  Filled 2012-08-18: qty 1

## 2012-08-18 MED ORDER — ACETAMINOPHEN 10 MG/ML IV SOLN
INTRAVENOUS | Status: DC | PRN
  Administered 2012-08-18: 1000 mg via INTRAVENOUS

## 2012-08-18 MED ORDER — GLYCOPYRROLATE 0.2 MG/ML IJ SOLN
INTRAMUSCULAR | Status: DC | PRN
  Administered 2012-08-18: 0.4 mg via INTRAVENOUS

## 2012-08-18 MED ORDER — OXYCODONE HCL 5 MG PO TABS: 5.0000 mg | ORAL_TABLET | ORAL | Status: DC | PRN

## 2012-08-18 MED ORDER — ONDANSETRON HCL 4 MG/2ML IJ SOLN: 4.0000 mg | Freq: Four times a day (QID) | INTRAMUSCULAR | Status: DC | PRN

## 2012-08-18 MED ORDER — ALUM & MAG HYDROXIDE-SIMETH 200-200-20 MG/5ML PO SUSP
30.0000 mL | Freq: Four times a day (QID) | ORAL | Status: DC | PRN
  Administered 2012-08-19: 30 mL via ORAL
  Filled 2012-08-18: qty 30

## 2012-08-18 SURGICAL SUPPLY — 89 items
APPLIER CLIP ROT 10 11.4 M/L (STAPLE)
BLADE HEX COATED 2.75 (ELECTRODE) ×2 IMPLANT
CANNULA ENDOPATH XCEL 11M (ENDOMECHANICALS) IMPLANT
CATH KIT ON Q 7.5IN SLV (PAIN MANAGEMENT) IMPLANT
CHLORAPREP W/TINT 26ML (MISCELLANEOUS) ×2 IMPLANT
CLIP APPLIE ROT 10 11.4 M/L (STAPLE) IMPLANT
CLOTH BEACON ORANGE TIMEOUT ST (SAFETY) ×2 IMPLANT
COVER MAYO STAND STRL (DRAPES) ×2 IMPLANT
COVER SURGICAL LIGHT HANDLE (MISCELLANEOUS) ×2 IMPLANT
CUTTER ECHEON FLEX ENDO 45 340 (ENDOMECHANICALS) IMPLANT
CUTTER FLEX LINEAR 45M (STAPLE) IMPLANT
DERMABOND ADVANCED (GAUZE/BANDAGES/DRESSINGS)
DERMABOND ADVANCED .7 DNX12 (GAUZE/BANDAGES/DRESSINGS) IMPLANT
DEVICE SUT QUICK LOAD TK 5 (STAPLE) IMPLANT
DEVICE SUT TI-KNOT TK 5X26 (MISCELLANEOUS) IMPLANT
DEVICE TROCAR PUNCTURE CLOSURE (ENDOMECHANICALS) IMPLANT
DRAPE LAPAROSCOPIC ABDOMINAL (DRAPES) ×2 IMPLANT
DRAPE UTILITY XL STRL (DRAPES) ×2 IMPLANT
DRAPE WARM FLUID 44X44 (DRAPE) ×4 IMPLANT
DRSG TEGADERM 2-3/8X2-3/4 SM (GAUZE/BANDAGES/DRESSINGS) IMPLANT
DRSG TEGADERM 4X4.75 (GAUZE/BANDAGES/DRESSINGS) IMPLANT
ELECT REM PT RETURN 9FT ADLT (ELECTROSURGICAL) ×2
ELECTRODE REM PT RTRN 9FT ADLT (ELECTROSURGICAL) ×1 IMPLANT
ENDO GIA UNIVERSAL XLG (ENDOMECHANICALS) IMPLANT
ENDOLOOP SUT PDS II  0 18 (SUTURE)
ENDOLOOP SUT PDS II 0 18 (SUTURE) IMPLANT
GLOVE BIO SURGEON STRL SZ7 (GLOVE) ×2 IMPLANT
GLOVE BIOGEL PI IND STRL 7.0 (GLOVE) ×12 IMPLANT
GLOVE BIOGEL PI INDICATOR 7.0 (GLOVE) ×12
GLOVE ECLIPSE 8.0 STRL XLNG CF (GLOVE) IMPLANT
GLOVE INDICATOR 8.0 STRL GRN (GLOVE) ×2 IMPLANT
GOWN PREVENTION PLUS LG XLONG (DISPOSABLE) ×12 IMPLANT
GOWN PREVENTION PLUS XLARGE (GOWN DISPOSABLE) ×2 IMPLANT
GOWN STRL NON-REIN LRG LVL3 (GOWN DISPOSABLE) ×2 IMPLANT
GOWN STRL REIN XL XLG (GOWN DISPOSABLE) ×6 IMPLANT
KIT BASIN OR (CUSTOM PROCEDURE TRAY) ×2 IMPLANT
LIGASURE IMPACT 36 18CM CVD LR (INSTRUMENTS) IMPLANT
MARKER SKIN DUAL TIP RULER LAB (MISCELLANEOUS) IMPLANT
NS IRRIG 1000ML POUR BTL (IV SOLUTION) ×2 IMPLANT
PENCIL BUTTON HOLSTER BLD 10FT (ELECTRODE) ×2 IMPLANT
POUCH SPECIMEN RETRIEVAL 10MM (ENDOMECHANICALS) ×2 IMPLANT
RELOAD 45 VASCULAR/THIN (ENDOMECHANICALS) IMPLANT
RELOAD BLUE (STAPLE) ×6 IMPLANT
RELOAD BLUE CHELON 45 (STAPLE) IMPLANT
RELOAD EGIA 45 MED/THCK PURPLE (STAPLE) IMPLANT
RELOAD EGIA 45 TAN VASC (STAPLE) IMPLANT
RELOAD EGIA 60 MED/THCK PURPLE (STAPLE) IMPLANT
RELOAD EGIA 60 TAN VASC (STAPLE) IMPLANT
RELOAD GOLD (STAPLE) IMPLANT
RELOAD GOLD ECHELON 45 (STAPLE) IMPLANT
RELOAD GREEN (STAPLE) IMPLANT
RELOAD GREEN ECHELON 45 (STAPLE) IMPLANT
RELOAD STAPLE TA45 3.5 REG BLU (ENDOMECHANICALS) IMPLANT
RELOAD WH ECHELON 45 (STAPLE) IMPLANT
SCALPEL HARMONIC ACE (MISCELLANEOUS) ×2 IMPLANT
SCISSORS LAP 5X35 DISP (ENDOMECHANICALS) ×2 IMPLANT
SET IRRIG TUBING LAPAROSCOPIC (IRRIGATION / IRRIGATOR) ×2 IMPLANT
SPONGE GAUZE 4X4 12PLY (GAUZE/BANDAGES/DRESSINGS) IMPLANT
SPONGE LAP 18X18 X RAY DECT (DISPOSABLE) IMPLANT
STAPLE ECHEON FLEX 60 POW ENDO (STAPLE) ×2 IMPLANT
STAPLER VISISTAT 35W (STAPLE) IMPLANT
SUT MNCRL AB 4-0 PS2 18 (SUTURE) ×2 IMPLANT
SUT PDS AB 0 CT1 36 (SUTURE) ×4 IMPLANT
SUT SILK 2 0 (SUTURE)
SUT SILK 2 0 SH CR/8 (SUTURE) IMPLANT
SUT SILK 2-0 18XBRD TIE 12 (SUTURE) IMPLANT
SUT SILK 3 0 (SUTURE)
SUT SILK 3 0 SH CR/8 (SUTURE) IMPLANT
SUT SILK 3-0 18XBRD TIE 12 (SUTURE) IMPLANT
TAPE UMBILICAL COTTON 1/8X30 (MISCELLANEOUS) IMPLANT
TIP INNERVISION DETACH 40FR (MISCELLANEOUS) IMPLANT
TIP INNERVISION DETACH 50FR (MISCELLANEOUS) IMPLANT
TIP INNERVISION DETACH 56FR (MISCELLANEOUS) IMPLANT
TIPS INNERVISION DETACH 40FR (MISCELLANEOUS)
TOWEL OR 17X26 10 PK STRL BLUE (TOWEL DISPOSABLE) ×4 IMPLANT
TRAY FOLEY CATH 14FRSI W/METER (CATHETERS) ×2 IMPLANT
TRAY LAP CHOLE (CUSTOM PROCEDURE TRAY) ×2 IMPLANT
TROCAR ADV FIXATION 11X100MM (TROCAR) IMPLANT
TROCAR ADV FIXATION 12X100MM (TROCAR) IMPLANT
TROCAR ADV FIXATION 5X100MM (TROCAR) IMPLANT
TROCAR BLADELESS OPT 5 75 (ENDOMECHANICALS) ×10 IMPLANT
TROCAR HASSON GELL 12X100 (TROCAR) IMPLANT
TROCAR SLEEVE XCEL 5X75 (ENDOMECHANICALS) ×6 IMPLANT
TROCAR XCEL 12X100 BLDLESS (ENDOMECHANICALS) ×2 IMPLANT
TROCAR XCEL BLUNT TIP 100MML (ENDOMECHANICALS) IMPLANT
TROCAR XCEL NON-BLD 11X100MML (ENDOMECHANICALS) IMPLANT
TUBING INSUFFLATION 10FT LAP (TUBING) ×2 IMPLANT
TUNNELER SHEATH ON-Q 16GX12 DP (PAIN MANAGEMENT) IMPLANT
YANKAUER SUCT BULB TIP 10FT TU (MISCELLANEOUS) IMPLANT

## 2012-08-18 NOTE — H&P (Signed)
[name] [name]  1930-05-29 [id number]   This patient is a 79 y.o.male who presents today for surgical evaluation   Reason for evaluation: Mass in proximal stomach.  GIST tumor.  Plan for laparoscopic resection.   Pleasant elderly retired Clinical research associate.  History of stable coronary disease.  Found to have a Gist tumor in his stomach.  4 cm in size.History of metastatic prostate cancer.  Disease stable.  There has been concerned that the tumor could be a source of bleeding and other issues.  Will gradually increase in size.  He was sent to me for surgical evaluation.  Felt to be of benefit to consider a laparoscopic approach.  He was interested in this.  Surgery has been delayed due to to upper respiratory infections in December.  He comes today to consider surgery.  No new events.  Past Medical History  Diagnosis Date  . Hypertension   . Hyperlipidemia   . Memory loss     mild  . Rectal ulcer     RESOLVED  . CAD (coronary artery disease)     S/P CABG x 5 1998; S/P stent LCX 2009  . Constipation   . History of ventricular fibrillation 1998    while on treadmill  . Hypothyroidism   . IBS (irritable bowel syndrome)   . Colitis     RESOLVED  . ED (erectile dysfunction)   . Osteoporosis   . Depression   . Diabetes mellitus     fasting blood sugar 140-200 usually  . GERD (gastroesophageal reflux disease)   . Heart attack 2009  . Dementia   . Neuromuscular disorder     DIABETIC NEUROPATHY  . Prostate cancer   . Pain     LOWER BACK  -- PAST HX OF STEROID EPIDURALS FOR PAIN RELIEF    Past Surgical History  Procedure Laterality Date  . Coronary artery bypass graft  1998    x5, with a left internal mammary to the LAD and diagonal, vein graft to the OM1 and 2, and a vein graft to the right coronary artery  . Coronary stent placement  11/13/2007    LCX  . Trigger finger repaired  2009    History   Social History  . Marital Status: Widowed    Spouse Name: N/A    Number of Children:  N/A  . Years of Education: N/A   Occupational History  . Not on file.   Social History Main Topics  . Smoking status: Former Games developer  . Smokeless tobacco: Former Neurosurgeon    Quit date: 02/11/1948  . Alcohol Use: No  . Drug Use: No  . Sexually Active: Not on file   Other Topics Concern  . Not on file   Social History Narrative  . No narrative on file    Family History  Problem Relation Age of Onset  . Stroke Father   . Pneumonia Father   . Colon cancer Neg Hx     Current Facility-Administered Medications  Medication Dose Route Frequency Provider Last Rate Last Dose  . cefoTEtan (CEFOTAN) 2 g in dextrose 5 % 50 mL IVPB  2 g Intravenous On Call to OR Ardeth Sportsman, MD      . chlorhexidine (HIBICLENS) 4 % liquid 1 application  1 application Topical Once Ardeth Sportsman, MD      . Melene Muller ON 08/19/2012] chlorhexidine (HIBICLENS) 4 % liquid 1 application  1 application Topical Once Ardeth Sportsman, MD  Allergies  Allergen Reactions  . Codeine Phosphate Other (See Comments)    mouth sores     BP 138/66  Pulse 47  Temp(Src) 97.4 F (36.3 C) (Oral)  Resp 16  SpO2 100%  Review of Systems  Constitutional: Negative for fever, chills and diaphoresis.  HENT: Negative for sore throat, trouble swallowing and neck pain.  Eyes: Negative for photophobia and visual disturbance.  Respiratory: Negative for choking and shortness of breath.  Cardiovascular: Negative for chest pain and palpitations.  Gastrointestinal: Negative for nausea, vomiting, abdominal distention, anal bleeding and rectal pain.  Genitourinary: Negative for dysuria, urgency, difficulty urinating and testicular pain.  Musculoskeletal: Negative for myalgias, arthralgias and gait problem.  Skin: Negative for color change and rash.  Neurological: Negative for dizziness, speech difficulty, weakness and numbness.  Hematological: Negative for adenopathy.  Psychiatric/Behavioral: Negative for hallucinations,  confusion and agitation.  Objective:   Physical Exam  Constitutional: He is oriented to person, place, and time. He appears well-developed and well-nourished. No distress.  HENT:  Head: Normocephalic.  Mouth/Throat: Oropharynx is clear and moist. No oropharyngeal exudate.  Eyes: Conjunctivae normal and EOM are normal. Pupils are equal, round, and reactive to light. No scleral icterus.  Neck: Normal range of motion. No tracheal deviation present.  Cardiovascular: Normal rate, normal heart sounds and intact distal pulses.  Pulmonary/Chest: Effort normal. No respiratory distress.  Abdominal: Soft. He exhibits no distension. There is no tenderness. Hernia confirmed negative in the right inguinal area and confirmed negative in the left inguinal area.  No hernias  Musculoskeletal: Normal range of motion. He exhibits no tenderness.  Neurological: He is alert and oriented to person, place, and time. No cranial nerve deficit. He exhibits normal muscle tone. Coordination normal.  Skin: Skin is warm and dry. No rash noted. He is not diaphoretic.  Psychiatric: He has a normal mood and affect. His behavior is normal.     Results:   Labs: No results found for this or any previous visit (from the past 48 hour(s)).  Imaging / Studies: No results found.  Medications / Allergies: per chart  Antibiotics: Anti-infectives   Start     Dose/Rate Route Frequency Ordered Stop   08/18/12 0600  cefoTEtan (CEFOTAN) 2 g in dextrose 5 % 50 mL IVPB    Comments:  Pharmacy may adjust dose strength for optimal dosing   2 g 100 mL/hr over 30 Minutes Intravenous On call to O.R. 08/17/12 1513 08/19/12 0559      Assessment  [name] [name]  79 y.o. male  Day of Surgery  Procedure(s): Laparoscopic Partial Gastrectomy, possible Intra Operative Ultrasound EGD  Problem List:  Principal Problem:   GIST (gastrointestinal stromal tumor), 4cm, proximal stomach Active Problems:   DIABETES MELLITUS, TYPE II    HYPERTENSION   Memory loss   Constipation, chronic   IBS (irritable bowel syndrome)   4 cm stable lesion in proximal stomach. Biopsy consistent with gastrointestinal stromal tumor.   Metastatic prostate cancer disease, stable Per his urologist.   Plan:   Standard of care is resection of this gastrointestinal stroma tumor.   I have discussed the case with the patient and his daughter. He is a reasonable laparoscopic candidate given the fact he has never had any prior abdominal surgery. I think this is for way enough from the cardia that could be a wedge stapled resection. Hopefully just a few hours. Hopefully he just a few days in the hospital. However, with his coronary disease, advanced age  and other health issues; he understands that he is at higher risk.   I had discussed the case with his urologist, Dr. Hillis Range. While the patient does have some evidence of metastatic prostate cancer, it is mild and rather stable. He feels this can be well controlled for many many years. Therefore, I think it is reasonable to be relatively aggressive.   I have discussed the procedure with the patient and his daughter:   The anatomy & physiology of the foregut and anti-reflux mechanism was discussed. The pathophysiology of GIST tumors discussed. Natural history risks without surgery was discussed. The patient's condition will not be treated adequately by medicines and other non-operative treatments. I feel the risks of no intervention will lead to serious problems that outweigh the operative risks; therefore, I recommended surgery to resect the area of stomach containing the GIST. Need for a thorough workup to rule out the differential diagnosis and plan treatment was explained. I explained laparoscopic techniques with possible need for an open approach.   Risks such as bleeding, infection, abscess, leak, need for further treatment, heart attack, death, and other risks were discussed. I noted a good  likelihood this will help address the problem. Goals of post-operative recovery were discussed as well. Possibility that this will not correct all symptoms was explained. Post-operative dysphagia, need for short-term liquid & pureed diet, possible need for medicines to help control symptoms in addition to surgery were discussed. We will work to minimize complications. Questions were answered.   The patient expresses understanding. He wishes to proceed with surgery.   Decent exercise tolerance & Cardiac clearance done.           Ardeth Sportsman, M.D., F.A.C.S. Gastrointestinal and Minimally Invasive [facility] [address] [place], [place] [address] [phone] Main / Paging [phone] Voice Mail   08/18/2012  CARE TEAM:  PCP: Rogelia Boga, MD  Outpatient Care Team: Patient Care Team: Gordy Savers, MD as PCP - General Marcine Matar, MD as Consulting Physician (Urology) Mardella Layman, MD as Attending Physician (Gastroenterology) Rollene Rotunda, MD as Consulting Physician (Cardiology)  Inpatient Treatment Team: Treatment Team: Attending Provider: Ardeth Sportsman, MD

## 2012-08-18 NOTE — Transfer of Care (Signed)
Immediate Anesthesia Transfer of Care Note  Patient: [name] [name]  Procedure(s) Performed: Procedure(s) with comments: Laparoscopic Partial Gastrectomy (N/A) - Laparoscopic Partial Gastrectomy,  Patient Location: PACU  Anesthesia Type:General  Level of Consciousness: awake, alert , oriented and patient cooperative  Airway & Oxygen Therapy: Patient Spontanous Breathing, Patient connected to face mask oxygen and Patient connected to face mask  Post-op Assessment: Report given to PACU RN and Post -op Vital signs reviewed and stable  Post vital signs: Reviewed and stable  Complications: No apparent anesthesia complications

## 2012-08-18 NOTE — Anesthesia Postprocedure Evaluation (Signed)
Anesthesia Post Note  Patient: [name] [name]  Procedure(s) Performed: Procedure(s) (LRB): Laparoscopic Partial Gastrectomy (N/A)  Anesthesia type: General  Patient location: PACU  Post pain: Pain level controlled  Post assessment: Post-op Vital signs reviewed  Last Vitals: BP 141/50  Pulse 74  Temp(Src) 36.3 C (Oral)  Resp 20  Ht 5\' 8"  (1.727 m)  Wt 196 lb 12.3 oz (89.254 kg)  BMI 29.93 kg/m2  SpO2 100%  Post vital signs: Reviewed  Level of consciousness: sedated  Complications: No apparent anesthesia complications

## 2012-08-18 NOTE — Op Note (Signed)
08/18/2012  11:47 AM  PATIENT:  [name] [name]  79 y.o. male  Patient Care Team: Gordy Savers, MD as PCP - General Marcine Matar, MD as Consulting Physician (Urology) Mardella Layman, MD as Attending Physician (Gastroenterology) Rollene Rotunda, MD as Consulting Physician (Cardiology)  PRE-OPERATIVE DIAGNOSIS:  GIST tumor of proximal stomach  POST-OPERATIVE DIAGNOSIS:  gastrointestinal stromal tumor of proximal stomach  PROCEDURE:  Procedure(s): Laparoscopic Partial Gastrectomy  SURGEON:  Surgeon(s): Ardeth Sportsman, MD Adolph Pollack, MD - Assisting  ANESTHESIA:   local and general  EBL:  Total I/O In: 1000 [I.V.:1000] Out: 15 [Other:5; Blood:10]  Delay start of Pharmacological VTE agent (>24hrs) due to surgical blood loss or risk of bleeding:  no  DRAINS: none   SPECIMEN:  Source of Specimen:  Gastric mass - GIST  DISPOSITION OF SPECIMEN:  PATHOLOGY  COUNTS:  YES  PLAN OF CARE: Admit to inpatient   PATIENT DISPOSITION:  PACU - hemodynamically stable.  INDICATION: Pleasant elderly gentleman with chronic anemia.  Bleeding mass in stomach.  Biopsy consistent with gastrointestinal stromal tumor.  History of prostate cancer but very chronic and stable.  Cardiac clearance done.  It was felt he would benefit from resection.  Patient for laparoscopic approach.  I discussed the procedure with he and his family:  The anatomy & physiology of the foregut and digestive tract was discussed.  The gastric pathophysiology was discussed.  Natural history risks without surgery was discussed.   The patient's situation is not adequately controlled by medicines and other non-operative treatments.  I feel the risks of no intervention will lead to serious problems that outweigh the operative risks; therefore, I recommended surgery to resect part of the stomach.  Need for a thorough workup to rule out the differential diagnosis and plan treatment was explained.  I explained  laparoscopic techniques with possible need for an open approach.  Risks such as bleeding, infection, abscess, leak, need for further treatment, heart attack, death, and other risks were discussed.   I noted a good likelihood this will help address the problem.  Goals of post-operative recovery were discussed as well.  Possibility that this will not correct all symptoms was explained.  Post-operative dysphagia, need for short-term liquid & pureed diet, inability to vomit, possibility of reherniation, possible need for medicines to help control symptoms in addition to surgery were discussed.  Possible need for a feeding tube was discussed.  We will work to minimize complications.   Educational handouts further explaining the pathology, treatment options, and dysphagia diet was given as well.  Questions were answered.  The patient expresses understanding & wishes to proceed with surgery.   OR FINDINGS: Ellipsoid firm mass on lesser curvature of stomach c/w GIST tumor.  No evidence of metastatic disease or other abnormalities.  DESCRIPTION:   Informed consent was confirmed.  The  patient received IV antibiotics prior to incision.  The underwent general anesthesia without difficulty.  A Foley catheter  sterilely placed.  The patient was positioned in split leg with arms tucked. The abdomen was prepped and draped in the sterile fashion.  Surgical time-out confirmed our plan.  I placed a 5 mm port in the left upper quadrant paramedian region using optical entry technique with the patient in steep reverse Trendelenburg and left side up.  Entry was clean.  We induced carbon dioxide insufflation.  Camera inspection revealed no injury.  Under Direct visualization, I placed 5 mm ports in the central abdomen and a 12  mm port in the epigastric region.  I also placed a 5 mm port in the left subxiphoid region under direct visualization.  I removed that and placed an Omega-shaped rigid Nathanson liver retractor  to lift the left lateral sector of the liver anteriorly to expose the esophageal hiatus.  This was secured to the bed using the iron man system.  I placed another 5mm port in the subxiphoid region.  Was able to ligate the hepatogastric ligament which was primarily avascular.  When up to the right crus.  Came further down.  With that we could isolate N/A ellipsoid mass poking up along the lesser curvature of the stomach.  He was about a third the way down from the esophageal diaphragmatic hiatus.  I proceeded to skeletonize the vessels and vagal branches off the lesser curvature stomach using harmonic scalpel.  Came around anteriorly and then freed up posteriorly until I got a lesser sac.  I further skeletonized and proximally until about 6 cm from the esophageal hiatus.  A total more distally down towards the antrum.  This help expose the lesser curve well.  After making sure that we had good mobility, and a stapling off the mass along the lesser curvature using a 60 cm Ethicon stapler.  We did three firings to come around the mass.  Hemostasis is excellent.  I placed a specimen inside an Endo Catch bag and removed it out the epigastric 12 of her wound after dilating up.  Felt an obvious discrete mass.  I did laparoscopic irrigation.  Hemostasis is excellent.  We attempted to place an NG tube down into the stomach but was not successful so aborted that.  I saw no evidence of any leak or perforation or other abnormality.  I removed the [facility] liver retractor under direct visualization.  I evacuated carbon dioxide and removed the ports.  The epigastric wound was closed transversely using 0 PDS interrupted stitches.  The skin was closed with Monocryl and sterile dressings applied.  The patient is being extubated and brought back to the recovery room.  I discussed postop care in detail with the patient and family in in the office.  Discussed again with the patient in the holding area.  I discussed it  with his son after surgery as well.

## 2012-08-19 ENCOUNTER — Encounter (HOSPITAL_COMMUNITY): Payer: Self-pay | Admitting: Surgery

## 2012-08-19 LAB — BASIC METABOLIC PANEL
BUN: 15 mg/dL (ref 6–23)
CO2: 24 mEq/L (ref 19–32)
Chloride: 101 mEq/L (ref 96–112)
GFR calc Af Amer: 67 mL/min — ABNORMAL LOW (ref >90)
Potassium: 4 mEq/L (ref 3.5–5.1)

## 2012-08-19 LAB — CBC
HCT: 30.5 % — ABNORMAL LOW (ref 39.0–52.0)
Hemoglobin: 10.5 g/dL — ABNORMAL LOW (ref 13.0–17.0)
MCHC: 34.4 g/dL (ref 30.0–36.0)
MCV: 89.7 fL (ref 78.0–100.0)
WBC: 5.8 10*3/uL (ref 4.0–10.5)

## 2012-08-19 LAB — GLUCOSE, CAPILLARY: Glucose-Capillary: 226 mg/dL — ABNORMAL HIGH (ref 70–99)

## 2012-08-19 LAB — HEMOGLOBIN A1C: Mean Plasma Glucose: 177 mg/dL — ABNORMAL HIGH (ref <117)

## 2012-08-19 MED ORDER — GLIMEPIRIDE 4 MG PO TABS
4.0000 mg | ORAL_TABLET | Freq: Every day | ORAL | Status: DC
  Administered 2012-08-19: 4 mg via ORAL
  Filled 2012-08-19 (×2): qty 1

## 2012-08-19 NOTE — Progress Notes (Signed)
Discharge instructions reviewed with patient, iv removed, patient voided, no complaints of pain, up ambulating, tolerating diet without complaints of nausea or vomiting, patient to follow up with MD Gross within 2 weeks Stanford Breed RN 08-19-2012 15:27pm

## 2012-08-19 NOTE — Progress Notes (Signed)
[name] [name] [id number] 1929-09-13   Subjective:  No events Min chest/upper abd pressiure/reflux - Maalox helps Minimal dicomfort  Objective:  Vital signs:  Filed Vitals:   08/18/12 1800 08/18/12 2145 08/19/12 0157 08/19/12 0605  BP: 139/66 148/76 114/65 144/76  Pulse: 60 52 51 55  Temp: 97.4 F (36.3 C) 98.1 F (36.7 C) 98.8 F (37.1 C) 98.7 F (37.1 C)  TempSrc: Oral Oral Oral Oral  Resp: 20 20 18 18   Height:      Weight:      SpO2: 100% 100% 100% 100%       Intake/Output   Yesterday:  02/18 0701 - 02/19 0700 In: 3408.3 [P.O.:960; I.V.:2398.3; IV Piggyback:50] Out: 2665 [Urine:2650; Blood:10] This shift:  Total I/O In: 1207.5 [P.O.:600; I.V.:607.5] Out: 2150 [Urine:2150]  Bowel function:  Flatus: y  BM: n  Physical Exam:  General: Pt awake/alert/oriented x4 in no acute distress Eyes: PERRL, normal EOM.  Sclera clear.  No icterus Neuro: CN II-XII intact w/o focal sensory/motor deficits. Lymph: No head/neck/groin lymphadenopathy Psych:  No delerium/psychosis/paranoia HENT: Normocephalic, Mucus membranes moist.  No thrush Neck: Supple, No tracheal deviation Chest: CTA bilaterally.  No chest wall pain w good excursion CV:  Pulses intact.  Regular rhythm MS: Normal AROM mjr joints.  No obvious deformity Abdomen: Soft.  Nondistended.  Dressings c/d/i.  Mildly tender at incisions only.  No incarcerated hernias. Ext:  SCDs BLE.  No mjr edema.  No cyanosis Skin: No petechiae / purpurae  Problem List:  Principal Problem:   GIST (gastrointestinal stromal tumor), 4cm, proximal stomach Active Problems:   DIABETES MELLITUS, TYPE II   HYPERTENSION   Memory loss   Constipation, chronic   IBS (irritable bowel syndrome)   Assessment  [name] [name]  79 y.o. male  1 Day Post-Op  Procedure(s): Laparoscopic Partial Gastrectomy  Stable  Plan:  -adv pureed diet - teach care. -d/c IVF -VTE prophylaxis- SCDs, etc -mobilize as tolerated to help  recovery -DM control -BP control - restart amlodipine/vasartan  D/C patient from hospital when patient meets criteria (later today vs tomorrow):  Tolerating oral intake well Ambulating in walkways Adequate pain control without IV medications Urinating  Having flatus  Ardeth Sportsman, M.D., F.A.C.S. Gastrointestinal and Minimally Invasive Surgery [facility] [address], [address] [place], [place] [address] [phone] Main / Paging [phone] Voice Mail   08/19/2012  CARE TEAM:  PCP: Rogelia Boga, MD  Outpatient Care Team: Patient Care Team: Gordy Savers, MD as PCP - General Marcine Matar, MD as Consulting Physician (Urology) Mardella Layman, MD as Attending Physician (Gastroenterology) Rollene Rotunda, MD as Consulting Physician (Cardiology)  Inpatient Treatment Team: Treatment Team: Attending Provider: Ardeth Sportsman, MD   Results:   Labs: Results for orders placed during the hospital encounter of 08/18/12 (from the past 48 hour(s))  GLUCOSE, CAPILLARY     Status: Abnormal   Collection Time    08/18/12  6:07 AM      Result Value Range   Glucose-Capillary 147 (*) 70 - 99 mg/dL  ABO/RH     Status: None   Collection Time    08/18/12 10:00 AM      Result Value Range   ABO/RH(D) AB NEG    TYPE AND SCREEN     Status: None   Collection Time    08/18/12 10:11 AM      Result Value Range   ABO/RH(D) AB NEG     Antibody  Screen NEG     Sample Expiration 08/21/2012    GLUCOSE, CAPILLARY     Status: Abnormal   Collection Time    08/18/12 11:57 AM      Result Value Range   Glucose-Capillary 196 (*) 70 - 99 mg/dL  GLUCOSE, CAPILLARY     Status: Abnormal   Collection Time    08/18/12  4:11 PM      Result Value Range   Glucose-Capillary 136 (*) 70 - 99 mg/dL   Comment 1 Notify RN     Comment 2 Documented in Chart    GLUCOSE, CAPILLARY     Status: Abnormal   Collection Time    08/18/12  9:40 PM      Result  Value Range   Glucose-Capillary 169 (*) 70 - 99 mg/dL  BASIC METABOLIC PANEL     Status: Abnormal   Collection Time    08/19/12  4:05 AM      Result Value Range   Sodium 134 (*) 135 - 145 mEq/L   Potassium 4.0  3.5 - 5.1 mEq/L   Chloride 101  96 - 112 mEq/L   CO2 24  19 - 32 mEq/L   Glucose, Bld 186 (*) 70 - 99 mg/dL   BUN 15  6 - 23 mg/dL   Creatinine, Ser 1.19  0.50 - 1.35 mg/dL   Calcium 8.4  8.4 - 14.7 mg/dL   GFR calc non Af Amer 58 (*) >90 mL/min   GFR calc Af Amer 67 (*) >90 mL/min   Comment:            The eGFR has been calculated     using the CKD EPI equation.     This calculation has not been     validated in all clinical     situations.     eGFR's persistently     <90 mL/min signify     possible Chronic Kidney Disease.  CBC     Status: Abnormal   Collection Time    08/19/12  4:05 AM      Result Value Range   WBC 5.8  4.0 - 10.5 K/uL   RBC 3.40 (*) 4.22 - 5.81 MIL/uL   Hemoglobin 10.5 (*) 13.0 - 17.0 g/dL   HCT 82.9 (*) 56.2 - 13.0 %   MCV 89.7  78.0 - 100.0 fL   MCH 30.9  26.0 - 34.0 pg   MCHC 34.4  30.0 - 36.0 g/dL   RDW 86.5  78.4 - 69.6 %   Platelets 146 (*) 150 - 400 K/uL    Imaging / Studies: No results found.  Medications / Allergies: per chart  Antibiotics: Anti-infectives   Start     Dose/Rate Route Frequency Ordered Stop   08/18/12 1400  cefOXitin (MEFOXIN) 2 g in dextrose 5 % 50 mL IVPB     2 g 100 mL/hr over 30 Minutes Intravenous 3 times per day 08/18/12 1248 08/18/12 2210   08/18/12 0600  cefoTEtan (CEFOTAN) 2 g in dextrose 5 % 50 mL IVPB  Status:  Discontinued    Comments:  Pharmacy may adjust dose strength for optimal dosing   2 g 100 mL/hr over 30 Minutes Intravenous On call to O.R. 08/17/12 1513 08/18/12 1239

## 2012-08-19 NOTE — Progress Notes (Signed)
Spoke with pt concerning Home Health PT. Pt selected [facility], referral given to in house rep.

## 2012-08-19 NOTE — Plan of Care (Signed)
Problem: Food- and Nutrition-Related Knowledge Deficit (NB-1.1) Goal: Nutrition education Formal process to instruct or train a patient/client in a skill or to impart knowledge to help patients/clients voluntarily manage or modify food choices and eating behavior to maintain or improve health. Outcome: Completed/Met Date Met:  08/19/12 Met with pt to discuss post-op diet for gastric resection. Emphasized the importance of puree diet for 1-2 weeks post-op until wrap edema resolves. Discussed various foods and how to puree them. Encouraged foods to be moist and not have any lumps or chunks in them. Teach back method used. Handouts discussed. RD contact information provided. Pt expressed understanding. Expect good compliance.

## 2012-08-19 NOTE — Evaluation (Signed)
Occupational Therapy Evaluation Patient Details Name: [name] [name] MRN: [id number] DOB: 1930-02-07 Today's Date: 08/19/2012 Time: 4098-1191 OT Time Calculation (min): 21 min  OT Assessment / Plan / Recommendation Clinical Impression  PT is an 79 year old male admitted for resection of a GI stromal tumor and has a h/o metastatic prostate CA with the deficits listed below.  Pt would benefit from cont OT to increase safety with basic adls so he will be I upon returning home.    OT Assessment  Patient needs continued OT Services    Follow Up Recommendations  Home health OT    Barriers to Discharge None    Equipment Recommendations  3 in 1 bedside comode    Recommendations for Other Services    Frequency  Min 2X/week    Precautions / Restrictions Precautions Precautions: Fall Restrictions Weight Bearing Restrictions: No   Pertinent Vitals/Pain Pt with 2/10 pain in abdomen.    ADL  Eating/Feeding: Performed;Independent Where Assessed - Eating/Feeding: Chair Grooming: Performed;Wash/dry hands;Supervision/safety Where Assessed - Grooming: Unsupported standing Upper Body Bathing: Simulated;Set up Where Assessed - Upper Body Bathing: Unsupported sitting Lower Body Bathing: Simulated;Supervision/safety Where Assessed - Lower Body Bathing: Unsupported sit to stand Upper Body Dressing: Performed;Set up Where Assessed - Upper Body Dressing: Unsupported sitting Lower Body Dressing: Performed;Set up Where Assessed - Lower Body Dressing: Unsupported sit to stand Toilet Transfer: Performed;Supervision/safety Toilet Transfer Method: Sit to Barista: Comfort height toilet Toileting - Clothing Manipulation and Hygiene: Simulated;Supervision/safety Where Assessed - Toileting Clothing Manipulation and Hygiene: Standing Transfers/Ambulation Related to ADLs: Pt mildly unsteady when walking to bathroom but did not lose balance at any time. ADL Comments: Pt does well  with all adls; concern for falls when on his feet b/c of unsteadiness but pt caught balance on own.    OT Diagnosis: Generalized weakness;Acute pain  OT Problem List: Impaired balance (sitting and/or standing);Decreased knowledge of use of DME or AE;Decreased knowledge of precautions;Pain OT Treatment Interventions: Self-care/ADL training;DME and/or AE instruction;Therapeutic activities   OT Goals Acute Rehab OT Goals OT Goal Formulation: With patient Time For Goal Achievement: 08/26/12 Potential to Achieve Goals: Good ADL Goals Pt Will Perform Grooming: with modified independence;Standing at sink ADL Goal: Grooming - Progress: Goal set today Pt Will Perform Lower Body Bathing: with modified independence;Sit to stand from chair ADL Goal: Lower Body Bathing - Progress: Goal set today Pt Will Perform Lower Body Dressing: with modified independence;Sit to stand from chair (gathering own clothes) ADL Goal: Lower Body Dressing - Progress: Goal set today Pt Will Perform Tub/Shower Transfer: with modified independence;Grab bars ADL Goal: Tub/Shower Transfer - Progress: Goal set today Additional ADL Goal #1: Pt will complete all aspects of toileting with 3:1 over commode with mod I. ADL Goal: Additional Goal #1 - Progress: Goal set today Additional ADL Goal #2: Pt will pick items off floor with S. ADL Goal: Additional Goal #2 - Progress: Goal set today  Visit Information  Last OT Received On: 08/19/12 Assistance Needed: +1    Subjective Data  Subjective: "I really feel pretty good...all things considered." Patient Stated Goal: to go home.   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 Dominant Hand: Right         Vision/Perception Vision - History Baseline Vision: Wears glasses all the time Patient Visual Report: No change from baseline Vision - Assessment Vision Assessment: Vision not tested   [employer] Overall Cognitive Status: Appears within functional limits for tasks assessed/performed Arousal/Alertness: Awake/alert Orientation Level: Oriented X4 / Intact Behavior During Session: WFL for tasks performed Cognition - Other Comments: chart reveals mild memory deficits but basic memory appears grossly intact with simple questioning    Extremity/Trunk Assessment Right Upper Extremity Assessment RUE ROM/Strength/Tone: Within functional levels RUE Sensation: WFL - Light Touch RUE Coordination: WFL - gross/fine motor Left Upper Extremity Assessment LUE ROM/Strength/Tone: Within functional levels LUE Sensation: WFL - Light Touch LUE Coordination: WFL - gross/fine motor Right Lower Extremity Assessment RLE ROM/Strength/Tone: Within functional levels Left Lower Extremity Assessment LLE ROM/Strength/Tone: Deficits LLE ROM/Strength/Tone Deficits: pt has strength 3+ hip flexion, dorsiflexion LLE Sensation: History of peripheral neuropathy Trunk Assessment Trunk Assessment: Normal     Mobility Bed Mobility Bed Mobility: Supine to Sit Supine to Sit: 7: Independent Details for Bed Mobility Assistance: no assist needed Transfers Transfers: Sit to Stand;Stand to Sit Sit to Stand: 5: Supervision Stand to Sit: 5: Supervision Details for Transfer Assistance: cues for safety and slow down     Exercise      Balance Balance Balance Assessed: No Static Standing Balance Static Standing - Balance Support: No upper extremity supported Static Standing - Level of Assistance: 5: Stand by assistance Static Standing - Comment/# of Minutes: pt stood in bathroom to use urinal with no assistancce.   End of Session OT - End of Session Activity Tolerance: Patient tolerated treatment well Patient left: in chair;with call bell/phone within reach;with family/visitor present Nurse Communication: Mobility status  GO     Hope Budds 08/19/2012, 12:15 PM [phone]

## 2012-08-19 NOTE — Evaluation (Signed)
Physical Therapy Evaluation Patient Details Name: [name] [name] MRN: [id number] DOB: [birth year] Today's Date: 08/19/2012 Time: 0940-1001 PT Time Calculation (min): 21 min  PT Assessment / Plan / Recommendation Clinical Impression  Pt. is 79 yo male admitted for laprascopic removal of stomach tumor. Pt. ambulated x 800 ft with RW. Gait is unsteady with h/o LLE deficits. Pt. lives alone. Has family close that are able to assist. Pt. will benefit from PT while in acute care to improve in functional mobility and safety.    PT Assessment  Patient needs continued PT services    Follow Up Recommendations  Home health PT    Does the patient have the potential to tolerate intense rehabilitation      Barriers to Discharge        Equipment Recommendations  None recommended by PT    Recommendations for Other Services     Frequency Min 3X/week    Precautions / Restrictions Precautions Precautions: Fall   Pertinent Vitals/Pain No c/o      Mobility  Bed Mobility Bed Mobility: Supine to Sit Supine to Sit: 7: Independent Transfers Transfers: Sit to Stand;Stand to Sit Sit to Stand: 4: Min guard Stand to Sit: 5: Supervision Details for Transfer Assistance: cues for safety and slow down Ambulation/Gait Ambulation/Gait Assistance: 4: Min guard Ambulation Distance (Feet): 800 Feet Assistive device: Rolling walker Ambulation/Gait Assistance Details: at times pt lists to L. side steps to catch balance but does stabilize self.  Gait Pattern: Step-through pattern;Decreased step length - left;Decreased stride length;Left circumduction;Lateral trunk lean to left Gait velocity: wfl    Exercises     PT Diagnosis: Abnormality of gait  PT Problem List: Decreased balance;Decreased activity tolerance;Decreased safety awareness PT Treatment Interventions: DME instruction;Gait training;Stair training;Balance training;Patient/family education   PT Goals Acute Rehab PT Goals PT Goal  Formulation: With patient/family Time For Goal Achievement: 09/02/12 Potential to Achieve Goals: Good Pt will go Sit to Stand: with modified independence PT Goal: Sit to Stand - Progress: Goal set today Pt will go Stand to Sit: with modified independence PT Goal: Stand to Sit - Progress: Goal set today Pt will Ambulate: >150 feet;with modified independence;with least restrictive assistive device PT Goal: Ambulate - Progress: Goal set today Pt will Go Up / Down Stairs: 1-2 stairs;with supervision;with least restrictive assistive device PT Goal: Up/Down Stairs - Progress: Goal set today Pt will Perform Home Exercise Program: Independently PT Goal: Perform Home Exercise Program - Progress: Goal set today  Visit Information  Last PT Received On: 08/19/12 Assistance Needed: +1    Subjective Data  Subjective: I need to walk Patient Stated Goal:  to return home   Prior Functioning  Home Living Lives With: Alone Available Help at Discharge: Family;Available PRN/intermittently Type of Home: House Home Access: Stairs to enter Entrance Stairs-Number of Steps: 2 Entrance Stairs-Rails: None Home Layout: One level Home Adaptive Equipment: Walker - rolling;Straight cane Prior Function Level of Independence: Independent with assistive device(s) Able to Take Stairs?: Yes Driving: Yes Communication Communication: No difficulties    Cognition  Cognition Overall Cognitive Status: Appears within functional limits for tasks assessed/performed Arousal/Alertness: Awake/alert Orientation Level: Appears intact for tasks assessed Behavior During Session: [facility] for tasks performed    Extremity/Trunk Assessment Right Lower Extremity Assessment RLE ROM/Strength/Tone: Within functional levels Left Lower Extremity Assessment LLE ROM/Strength/Tone: Deficits LLE ROM/Strength/Tone Deficits: pt has strength 3+ hip flexion, dorsiflexion LLE Sensation: History of peripheral neuropathy Trunk  Assessment Trunk Assessment: Normal   Balance Balance Balance Assessed:  Yes Static Standing Balance Static Standing - Balance Support: No upper extremity supported Static Standing - Level of Assistance: 5: Stand by assistance Static Standing - Comment/# of Minutes: pt stood in bathroom to use urinal with no assistancce.  End of Session PT - End of Session Activity Tolerance: Patient tolerated treatment well Patient left: in chair;with call bell/phone within reach;with family/visitor present Nurse Communication: Mobility status  GP     Rada Hay 08/19/2012, 10:16 AM [phone]

## 2012-08-20 ENCOUNTER — Ambulatory Visit: Payer: Medicare Other | Admitting: Cardiology

## 2012-08-21 ENCOUNTER — Telehealth (INDEPENDENT_AMBULATORY_CARE_PROVIDER_SITE_OTHER): Payer: Self-pay

## 2012-08-21 NOTE — Telephone Encounter (Signed)
Pt notified of path report have clear margins for GIST tumor per Dr Michaell Cowing. I advised pt that when he comes in for his f/u appt I will give him a copy of his path report. The pt understands.

## 2012-08-21 NOTE — Discharge Summary (Signed)
Physician Discharge Summary  Patient ID: [name] [name] MRN: [id number] DOB/AGE: [birth year] 79 y.o.  Admit date: 08/18/2012 Discharge date: 08/21/2012  Admission Diagnoses: Principal Problem:   GIST (gastrointestinal stromal tumor), 4cm, proximal stomach Active Problems:   DIABETES MELLITUS, TYPE II   HYPERTENSION   Memory loss   Constipation, chronic   IBS (irritable bowel syndrome)  Discharge Diagnoses:  Principal Problem:   GIST (gastrointestinal stromal tumor), 4cm, proximal stomach Active Problems:   DIABETES MELLITUS, TYPE II   HYPERTENSION   Memory loss   Constipation, chronic   IBS (irritable bowel syndrome)   Discharged Condition: good  Hospital Course:   Pt underwent surgery - wedge gastric rersection.  Postoperatively, the patient was placed on an anti-ileus protocol.  The patient mobilized and advanced to a solid diet gradually.  Pain was well-controlled and transitioned off IV medications.    By the time of discharge, the patient was walking well the hallways, eating pureed food well, having flatus.  Pain was-controlled on an oral regimen.  Based on meeting DC criteria and recovering well, I felt it was safe for the patient to be discharged home with close followup.  Instructions were discussed in detail.  They are written as well.     Consults: None  Significant Diagnostic Studies:   Treatments: surgery: Laparoscopic Partial Gastrectomy   Discharge Exam: Blood pressure 104/72, pulse 56, temperature 98.3 F (36.8 C), temperature source Oral, resp. rate 16, height 5\' 8"  (1.727 m), weight 196 lb 12.3 oz (89.254 kg), SpO2 100.00%.  General: Pt awake/alert/oriented x4 in no major acute distress Eyes: PERRL, normal EOM. Sclera nonicteric Neuro: CN II-XII intact w/o focal sensory/motor deficits. Lymph: No head/neck/groin lymphadenopathy Psych:  No delerium/psychosis/paranoia HENT: Normocephalic, Mucus membranes moist.  No thrush Neck: Supple, No tracheal  deviation Chest: No pain.  Good respiratory excursion. CV:  Pulses intact.  Regular rhythm MS: Normal AROM mjr joints.  No obvious deformity Abdomen: Soft, Nondistended.  Min tender at clean dressings.  No incarcerated hernias. Ext:  SCDs BLE.  No significant edema.  No cyanosis Skin: No petechiae / purpurae   Disposition: 01-Home or Self Care  Discharge Orders   Future Appointments Provider Department Dept Phone   09/08/2012 11:30 AM Ardeth Sportsman, MD [facility] [place] [phone]   10/15/2012 3:45 PM Gordy Savers, MD  [facility] [phone]   Future Orders Complete By Expires     Increase activity slowly  As directed         Medication List    TAKE these medications       amLODipine-valsartan 10-320 MG per tablet  Commonly known as:  EXFORGE  Take 1 tablet by mouth every morning.     aspirin EC 81 MG tablet  Take 81 mg by mouth every morning.     atorvastatin 10 MG tablet  Commonly known as:  LIPITOR  Take 10 mg by mouth every morning.     calcium carbonate-magnesium hydroxide 334 MG Chew  Commonly known as:  ROLAIDS  Chew 1 tablet by mouth 2 (two) times daily as needed.     CITRACAL + D PO  Take 1 tablet by mouth daily.     docusate sodium 50 MG capsule  Commonly known as:  COLACE  Take 50-100 mg by mouth 2 (two) times daily.     donepezil 10 MG tablet  Commonly known as:  ARICEPT  Take 10 mg by mouth daily.     glimepiride 4 MG  tablet  Commonly known as:  AMARYL  Take 4 mg by mouth daily before breakfast.     JUICE PLUS FIBRE PO  Take 2 capsules by mouth 3 (three) times daily with meals.     leuprolide 3.75 MG injection  Commonly known as:  LUPRON  Inject 3.75 mg into the muscle every 4 (four) months.     levothyroxine 50 MCG tablet  Commonly known as:  SYNTHROID, LEVOTHROID  Take 50 mcg by mouth every morning.     metFORMIN 1000 MG tablet  Commonly known as:  GLUCOPHAGE  Take 500-1,000 mg by mouth 2 (two)  times daily with a meal. Take 1,000mg  every morning and take 500mg  every evening.     nitroGLYCERIN 0.4 MG SL tablet  Commonly known as:  NITROSTAT  Place 0.4 mg under the tongue every 5 (five) minutes as needed. For chest pain     omega-3 acid ethyl esters 1 G capsule  Commonly known as:  LOVAZA  Take 1 g by mouth 2 (two) times daily.     oxybutynin 10 MG 24 hr tablet  Commonly known as:  DITROPAN-XL  Take 10 mg by mouth daily.     oxyCODONE 5 MG immediate release tablet  Commonly known as:  Oxy IR/ROXICODONE  Take 1-2 tablets (5-10 mg total) by mouth every 6 (six) hours as needed for pain.     pregabalin 50 MG capsule  Commonly known as:  LYRICA  Take 50 mg by mouth 2 (two) times daily.     promethazine 25 MG suppository  Commonly known as:  PHENERGAN  Place 1 suppository (25 mg total) rectally every 6 (six) hours as needed for nausea.     promethazine 12.5 MG tablet  Commonly known as:  PHENERGAN  Take 1-2 tablets (12.5-25 mg total) by mouth every 6 (six) hours as needed for nausea.     sertraline 50 MG tablet  Commonly known as:  ZOLOFT  Take 50 mg by mouth at bedtime.     sitaGLIPtin 100 MG tablet  Commonly known as:  JANUVIA  Take 100 mg by mouth at bedtime.     XGEVA [place]  Inject into the skin every 30 (thirty) days. Injects "around the first part of each month."           Follow-up Information   Follow up with Azarria Balint C., MD. Schedule an appointment as soon as possible for a visit in 2 weeks.   Contact information:   [address] [place] [address] [phone]       Signed: Ardeth Sportsman. 08/21/2012, 12:58 PM

## 2012-09-08 ENCOUNTER — Encounter (INDEPENDENT_AMBULATORY_CARE_PROVIDER_SITE_OTHER): Payer: Self-pay | Admitting: Surgery

## 2012-09-08 ENCOUNTER — Ambulatory Visit (INDEPENDENT_AMBULATORY_CARE_PROVIDER_SITE_OTHER): Payer: Medicare Other | Admitting: Surgery

## 2012-09-08 ENCOUNTER — Encounter: Payer: Self-pay | Admitting: Internal Medicine

## 2012-09-08 VITALS — BP 114/70 | HR 54 | Temp 97.5°F | Resp 14 | Ht 68.0 in | Wt 185.0 lb

## 2012-09-08 DIAGNOSIS — K219 Gastro-esophageal reflux disease without esophagitis: Secondary | ICD-10-CM

## 2012-09-08 DIAGNOSIS — R682 Dry mouth, unspecified: Secondary | ICD-10-CM | POA: Insufficient documentation

## 2012-09-08 NOTE — Patient Instructions (Addendum)
Consider trial of an antacid Prilosec to help with any possible persistent reflux/laryngitis.  Okay to advance to a high fiber regular diet as tolerated.  Sore or Dry Mouth Care A sore or dry mouth may happen for many different reasons. Sometimes, treatment for other health problems may have to stop until your sore or dry mouth gets better.  HOME CARE  Do not smoke or chew tobacco.  Use fake (artificial) saliva when your mouth feels dry.  Use a humidifier in your bedroom at night.  Eat small meals and snacks.  Eat food cold or at room temperature.  Suck on ice-chips or try frozen ice pops or juice bars, ice-cream, and watermelon. Do not have citrus flavors.  Suck on hard, sugarless, sour candy, or chew sugarless gum to help make more saliva.  Eat soft foods such as yogurt, bananas, canned fruit, mashed potatoes, oatmeal, rice, eggs, cottage cheese, macaroni and cheese, jello, and pudding.  Microwave vegetables and fruits to soften them.  Puree cooked food in a blender if needed.  Make dry food moist by using olive oil, gravy, or mild sauces. Dip foods in liquids.  Keep a glass of water or squirt bottle nearby. Take sips often throughout the day.  Limit caffeine.  Avoid:  Pop or fizzy drinks.  Alcohol.  Citrus juices.  Acidic food.  Salty or spicy food.  Foods or drinks that are very hot.  Hard or crunchy food. Mouth Care  Wash your hands well with soap and water before doing mouth care.  Use fake saliva as told by your doctor.  Use medicine on the sore places.  Brush your teeth at least 2 times a day. Brush after each meal if possible. Rinse your mouth with water after each meal and after drinking a sweet drink.  Brush slowly and gently in small circles. Do not brush side-to-side.  Use regular toothpastes, but stay away from ones that have sodium laurel sulfate in them.  Gargle with a baking soda mouthwash ( teaspoon baking soda mixed in with 4 cups of  water).  Gargle with medicated mouthwash.  Use dental floss or dental tape to clean between your teeth every day.  Use a lanolin-based lip balm to keep your lips from getting dry.  If you wear dentures or bridges:  You may need to leave them out until your doctor tells you to start wearing them again.  Take them out at night if you wear them daily. Soak them in warm water or denture solution. Take your dentures out as much as you can during the day. Take them out when you use mouthwash.  After each meal, brush your gums gently with a soft brush and rinse your mouth with water.  If your dentures rub on your gums and cause a sore spot, have your dentist check and fix your dentures right away. GET HELP RIGHT AWAY IF:   Your mouth gets more painful or dry.  You have questions. MAKE SURE YOU:  Understand these instructions.  Will watch your condition.  Will get help right away if you are not doing well or get worse. Document Released: 04/14/2009 Document Revised: 09/09/2011 Document Reviewed: 04/14/2009 [facility] Patient Information 2013 [place], [place].  Laryngitis Laryngitis is redness, soreness, and puffiness (inflammation) of the vocal cords. It causes hoarseness, cough, loss of voice, sore throat, and dry throat. It may be caused by:  Infection.  Too much smoking.  Too much talking or yelling.  Breathing in of toxic fumes.  Allergies.  A backup of acid from your stomach. HOME CARE  Drink enough fluids to keep your pee (urine) clear or pale yellow.  Rest until you no longer have problems or as told by your doctor.  Breathe in moist air.  Take all medicine as told by your doctor.  Do not smoke.  Talk as little as possible (this includes whispering).  Write on paper instead of talking until your voice is back to normal.  Follow up with your doctor if you have not improved after 10 days. GET HELP IF:   You have trouble breathing.  You cough up  blood.  You have a fever that will not go away.  You have increasing pain.  You have trouble swallowing. MAKE SURE YOU:  Understand these instructions.  Will watch your condition.  Will get help right away if you are not doing well or get worse. Document Released: 06/06/2011 Document Revised: 09/09/2011 Document Reviewed: 06/06/2011 [facility] Patient Information 2013 [place], [place].  Diet for Gastroesophageal Reflux Disease, Adult Reflux (acid reflux) is when acid from your stomach flows up into the esophagus. When acid comes in contact with the esophagus, the acid causes irritation and soreness (inflammation) in the esophagus. When reflux happens often or so severely that it causes damage to the esophagus, it is called gastroesophageal reflux disease (GERD). Nutrition therapy can help ease the discomfort of GERD. FOODS OR DRINKS TO AVOID OR LIMIT  Smoking or chewing tobacco. Nicotine is one of the most potent stimulants to acid production in the gastrointestinal tract.  Caffeinated and decaffeinated coffee and black tea.  Regular or low-calorie carbonated beverages or energy drinks (caffeine-free carbonated beverages are allowed).   Strong spices, such as black pepper, white pepper, red pepper, cayenne, curry powder, and chili powder.  Peppermint or spearmint.  Chocolate.  High-fat foods, including meats and fried foods. Extra added fats including oils, butter, salad dressings, and nuts. Limit these to less than 8 tsp per day.  Fruits and vegetables if they are not tolerated, such as citrus fruits or tomatoes.  Alcohol.  Any food that seems to aggravate your condition. If you have questions regarding your diet, call your caregiver or a registered dietitian. OTHER THINGS THAT MAY HELP GERD INCLUDE:   Eating your meals slowly, in a relaxed setting.  Eating 5 to 6 small meals per day instead of 3 large meals.  Eliminating food for a period of time if it causes  distress.  Not lying down until 3 hours after eating a meal.  Keeping the head of your bed raised 6 to 9 inches (15 to 23 cm) by using a foam wedge or blocks under the legs of the bed. Lying flat may make symptoms worse.  Being physically active. Weight loss may be helpful in reducing reflux in overweight or obese adults.  Wear loose fitting clothing EXAMPLE MEAL PLAN This meal plan is approximately 2,000 calories based on https://www.bernard.org/ meal planning guidelines. Breakfast   cup cooked oatmeal.  1 cup strawberries.  1 cup low-fat milk.  1 oz almonds. Snack  1 cup cucumber slices.  6 oz yogurt (made from low-fat or fat-free milk). Lunch  2 slice whole-wheat bread.  2 oz sliced [place].  2 tsp mayonnaise.  1 cup blueberries.  1 cup snap peas. Snack  6 whole-wheat crackers.  1 oz string cheese. Dinner   cup brown rice.  1 cup mixed veggies.  1 tsp olive oil.  3 oz grilled fish. Document Released: 06/17/2005  Document Revised: 09/09/2011 Document Reviewed: 05/03/2011 [facility] Patient Information 2013 [place], [place].
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
## 2012-10-15 ENCOUNTER — Ambulatory Visit (INDEPENDENT_AMBULATORY_CARE_PROVIDER_SITE_OTHER): Payer: Medicare Other | Admitting: Internal Medicine

## 2012-10-15 ENCOUNTER — Encounter: Payer: Self-pay | Admitting: Internal Medicine

## 2012-10-15 VITALS — BP 126/80 | HR 48 | Temp 97.6°F | Resp 18 | Wt 186.0 lb

## 2012-10-15 DIAGNOSIS — E119 Type 2 diabetes mellitus without complications: Secondary | ICD-10-CM

## 2012-10-15 DIAGNOSIS — I10 Essential (primary) hypertension: Secondary | ICD-10-CM

## 2012-10-15 DIAGNOSIS — E1149 Type 2 diabetes mellitus with other diabetic neurological complication: Secondary | ICD-10-CM

## 2012-10-15 DIAGNOSIS — I2510 Atherosclerotic heart disease of native coronary artery without angina pectoris: Secondary | ICD-10-CM

## 2012-10-15 MED ORDER — ACCU-CHEK SOFTCLIX LANCETS MISC: 1.0000 | Freq: Two times a day (BID) | Status: DC | PRN

## 2012-10-15 MED ORDER — GLUCOSE BLOOD VI STRP: 1.0000 | ORAL_STRIP | Freq: Two times a day (BID) | Status: DC | PRN

## 2012-10-15 NOTE — Progress Notes (Signed)
Subjective:    Patient ID: [name] [name], male    DOB: 1929/08/16, 79 y.o.   MRN: [id number]  HPI  79 year old patient who is seen today for followup. He has diabetes hypertension coronary artery disease. Since his last visit here he has a laparoscopic surgery for GIST involving the proximal stomach. He has done quite well. In general does well today although states he feels a bit weak this afternoon. He states he has been very active today and has missed his nap. In general feels quite well. His last hemoglobin A1c had increased to 7.8. More recently blood sugars run in the 70-180 range. He feels his glycemic control has been improved. No hypoglycemia  Past Medical History  Diagnosis Date  . Hypertension   . Hyperlipidemia   . Memory loss     mild  . Rectal ulcer     RESOLVED  . CAD (coronary artery disease)     S/P CABG x 5 1998; S/P stent LCX 2009  . Constipation   . History of ventricular fibrillation 1998    while on treadmill  . Hypothyroidism   . IBS (irritable bowel syndrome)   . Colitis     RESOLVED  . ED (erectile dysfunction)   . Osteoporosis   . Depression   . Diabetes mellitus     fasting blood sugar 140-200 usually  . GERD (gastroesophageal reflux disease)   . Heart attack 2009  . Dementia   . Neuromuscular disorder     DIABETIC NEUROPATHY  . Prostate cancer   . Pain     LOWER BACK  -- PAST HX OF STEROID EPIDURALS FOR PAIN RELIEF  . 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 Marital Status: Widowed    Spouse Name: N/A    Number of Children: N/A  . Years of Education: N/A   Occupational History  . Not on file.   Social History Main Topics  . Smoking status: Former Games developer  . Smokeless tobacco: Former Neurosurgeon    Quit date: 02/11/1948  . Alcohol Use: No  . Drug Use: No  . Sexually Active:  Not on file   Other Topics Concern  . Not on file   Social History Narrative  . No narrative on file    Past Surgical History  Procedure Laterality Date  . Coronary artery bypass graft  1998    x5, with a left internal mammary to the LAD and diagonal, vein graft to the OM1 and 2, and a vein graft to the right coronary artery  . Coronary stent placement  11/13/2007    LCX  . Trigger finger repaired  2009  . Laparoscopic gastrectomy N/A 08/18/2012    Procedure: Laparoscopic Partial Gastrectomy;  Surgeon: Ardeth Sportsman, MD;  Location: [facility];  Service: General;  Laterality: N/A;  Laparoscopic Partial Gastrectomy,    Family History  Problem Relation Age of Onset  . Stroke Father   . Pneumonia Father   . Colon cancer Neg Hx     Allergies  Allergen Reactions  . Codeine Phosphate Other (See Comments)    mouth sores    Current Outpatient Prescriptions on File Prior to Visit  Medication Sig Dispense Refill  . amLODipine-valsartan (EXFORGE) 10-320 MG per tablet Take 1 tablet by mouth every morning.       [name] aspirin  EC 81 MG tablet Take 81 mg by mouth every morning.       [name] atorvastatin (LIPITOR) 10 MG tablet Take 10 mg by mouth every morning.       . bicalutamide (CASODEX) 50 MG tablet       . calcium carbonate-magnesium hydroxide (ROLAIDS) 334 MG CHEW Chew 1 tablet by mouth 2 (two) times daily as needed.      . Calcium Citrate-Vitamin D (CITRACAL + D PO) Take 1 tablet by mouth daily.      . Denosumab (XGEVA [place]) Inject into the skin every 30 (thirty) days. Injects "around the first part of each month."      . docusate sodium (COLACE) 50 MG capsule Take 50-100 mg by mouth 2 (two) times daily.       [name] donepezil (ARICEPT) 10 MG tablet Take 10 mg by mouth daily.      [name] glimepiride (AMARYL) 4 MG tablet Take 4 mg by mouth daily before breakfast.      . leuprolide (LUPRON) 3.75 MG injection Inject 3.75 mg into the muscle every 4 (four) months.      . levothyroxine (SYNTHROID, LEVOTHROID)  50 MCG tablet Take 50 mcg by mouth every morning.       . metFORMIN (GLUCOPHAGE) 1000 MG tablet Take 500-1,000 mg by mouth 2 (two) times daily with a meal. Take 1,000mg  every morning and take 500mg  every evening.      . nitroGLYCERIN (NITROSTAT) 0.4 MG SL tablet Place 0.4 mg under the tongue every 5 (five) minutes as needed. For chest pain      . Nutritional Supplements (JUICE PLUS FIBRE PO) Take 2 capsules by mouth 3 (three) times daily with meals.       [name] omega-3 acid ethyl esters (LOVAZA) 1 G capsule Take 1 g by mouth 2 (two) times daily.      [name] oxybutynin (DITROPAN-XL) 10 MG 24 hr tablet Take 10 mg by mouth daily.       . pregabalin (LYRICA) 50 MG capsule Take 50 mg by mouth 2 (two) times daily.      . sertraline (ZOLOFT) 50 MG tablet Take 50 mg by mouth at bedtime.      . sitaGLIPtin (JANUVIA) 100 MG tablet Take 100 mg by mouth at bedtime.       . [DISCONTINUED] chlorthalidone (HYGROTON) 25 MG tablet Take 12.5 mg by mouth daily.       No current facility-administered medications on file prior to visit.    BP 126/80  Pulse 48  Temp(Src) 97.6 F (36.4 C) (Oral)  Resp 18  Wt 186 lb (84.369 kg)  BMI 28.29 kg/m2  SpO2 98%       Review of Systems  Constitutional: Negative for fever, chills, appetite change and fatigue.  HENT: Negative for hearing loss, ear pain, congestion, sore throat, trouble swallowing, neck stiffness, dental problem, voice change and tinnitus.   Eyes: Negative for pain, discharge and visual disturbance.  Respiratory: Negative for cough, chest tightness, wheezing and stridor.   Cardiovascular: Negative for chest pain, palpitations and leg swelling.  Gastrointestinal: Negative for nausea, vomiting, abdominal pain, diarrhea, constipation, blood in stool and abdominal distention.  Genitourinary: Negative for urgency, hematuria, flank pain, discharge, difficulty urinating and genital sores.  Musculoskeletal: Negative for myalgias, back pain, joint swelling,  arthralgias and gait problem.  Skin: Negative for rash.  Neurological: Positive for weakness. Negative for dizziness, syncope, speech difficulty, numbness and headaches.  Hematological: Negative for adenopathy. Does not bruise/bleed  easily.  Psychiatric/Behavioral: Negative for behavioral problems and dysphoric mood. The patient is not nervous/anxious.        Objective:   Physical Exam  Constitutional: He is oriented to person, place, and time. He appears well-developed.  HENT:  Head: Normocephalic.  Right Ear: External ear normal.  Left Ear: External ear normal.  Eyes: Conjunctivae and EOM are normal.  Neck: Normal range of motion.  Cardiovascular: Normal rate and normal heart sounds.   Pulmonary/Chest: Breath sounds normal.  Abdominal: Bowel sounds are normal.  Musculoskeletal: Normal range of motion. He exhibits no edema and no tenderness.  Neurological: He is alert and oriented to person, place, and time.  Psychiatric: He has a normal mood and affect. His behavior is normal.          Assessment & Plan:   Diabetes mellitus. We'll continue present regimen recheck hemoglobin A1c in 2 months Hypertension controlled Coronary artery disease stable Right shoulder pain. Probable mild bursitis. Tylenol and warm compresses recommended we'll call if unimproved

## 2012-10-15 NOTE — Patient Instructions (Addendum)
It is important that you exercise regularly, at least 20 minutes 3 to 4 times per week.  If you develop chest pain or shortness of breath seek  medical attention.  Limit your sodium (Salt) intake

## 2012-10-15 NOTE — Progress Notes (Signed)
  Subjective:    Patient ID: [name] [name], male    DOB: [birth year], 79 y.o.   MRN: [id number]  HPI  Wt Readings from Last 3 Encounters:  10/15/12 186 lb (84.369 kg)  09/08/12 185 lb (83.915 kg)  08/18/12 196 lb 12.3 oz (89.254 kg)    Review of Systems     Objective:   Physical Exam        Assessment & Plan:

## 2012-11-01 ENCOUNTER — Other Ambulatory Visit: Payer: Self-pay | Admitting: Internal Medicine

## 2012-11-02 ENCOUNTER — Telehealth: Payer: Self-pay | Admitting: Internal Medicine

## 2012-11-02 MED ORDER — SERTRALINE HCL 50 MG PO TABS: 25.0000 mg | ORAL_TABLET | Freq: Every day | ORAL | Status: DC

## 2012-11-02 NOTE — Telephone Encounter (Signed)
Decrease Zoloft to 25 mg daily for 2 weeks then discontinue (okay to take one half of 50 mg tablet)

## 2012-11-02 NOTE — Telephone Encounter (Signed)
Spoke to pt told him to decrease Zoloft to 25 mg daily x two weeks and then can discontinue. Pt verbalized understanidng and stated needs refill. Told pt will send enough to pharmacy for 2 weeks. Pt verbalized understanding.

## 2012-11-02 NOTE — Telephone Encounter (Signed)
Patient is currently taking Zoloft 50mg  at night.  He states that Dr.Kwiatkowski prescribed after the death of his wife last year.  He feels like now he is over this and wants to come off the Zoloft. He has no more refills and has only a one week supply. He would like Dr.Kwiatkowski guidance. Advised caller not to stop the medication "cold [place]".  It may require a tapering of medication. I advised that I would forward request to Dr.K. And have some one contact him. Understanding expressed. Please contact patient at [phone].

## 2012-11-20 ENCOUNTER — Other Ambulatory Visit: Payer: Self-pay

## 2012-11-20 MED ORDER — PREGABALIN 50 MG PO CAPS: ORAL_CAPSULE | ORAL | Status: DC

## 2012-11-20 NOTE — Telephone Encounter (Signed)
Former [facility] patient assigned to Dr Frances Furbish.  Requesting refill on Lyrica.

## 2012-12-07 ENCOUNTER — Telehealth: Payer: Self-pay | Admitting: Internal Medicine

## 2012-12-07 DIAGNOSIS — E119 Type 2 diabetes mellitus without complications: Secondary | ICD-10-CM

## 2012-12-07 NOTE — Telephone Encounter (Signed)
PT request a1c labs prior to his 12/10/12 appt, due to the fact that his sugar has been elevated. He stated his BS has been running anywhere between 66-288. Please assist.

## 2012-12-08 ENCOUNTER — Other Ambulatory Visit (INDEPENDENT_AMBULATORY_CARE_PROVIDER_SITE_OTHER): Payer: Medicare Other

## 2012-12-08 DIAGNOSIS — E119 Type 2 diabetes mellitus without complications: Secondary | ICD-10-CM

## 2012-12-08 LAB — HEMOGLOBIN A1C: Hgb A1c MFr Bld: 6 % (ref 4.6–6.5)

## 2012-12-10 ENCOUNTER — Ambulatory Visit (INDEPENDENT_AMBULATORY_CARE_PROVIDER_SITE_OTHER): Payer: Medicare Other | Admitting: Internal Medicine

## 2012-12-10 ENCOUNTER — Encounter: Payer: Self-pay | Admitting: Internal Medicine

## 2012-12-10 VITALS — BP 130/70 | HR 51 | Temp 98.6°F | Resp 20 | Ht 67.5 in | Wt 189.0 lb

## 2012-12-10 DIAGNOSIS — E1149 Type 2 diabetes mellitus with other diabetic neurological complication: Secondary | ICD-10-CM

## 2012-12-10 DIAGNOSIS — Z8546 Personal history of malignant neoplasm of prostate: Secondary | ICD-10-CM

## 2012-12-10 DIAGNOSIS — E119 Type 2 diabetes mellitus without complications: Secondary | ICD-10-CM

## 2012-12-10 DIAGNOSIS — I10 Essential (primary) hypertension: Secondary | ICD-10-CM

## 2012-12-10 DIAGNOSIS — I2510 Atherosclerotic heart disease of native coronary artery without angina pectoris: Secondary | ICD-10-CM

## 2012-12-10 NOTE — Patient Instructions (Addendum)
Aleve 2 tablets twice daily  Prilosec/Nexium once daily  Decrease glimepiride to 2 mg(one half of a 4 mg tablet) once daily in the morning  Return in one month for follow-up   Dietary consultation as discussed

## 2012-12-10 NOTE — Progress Notes (Signed)
  Subjective:    Patient ID: [name] [name], male    DOB: 12-01-1929, 79 y.o.   MRN: [id number]  HPI  79 year old patient who has type 2 diabetes. A hemoglobin A1c was done yesterday and was 6.0.  He has metastatic prostate cancer and also had abdominal and pelvic CT performed by urology yesterday. Results are pending.  Complaints today include pain in the right shoulder right knee and right ankle. This has been present for 2-3 weeks.  He has periods of the prolonged fatigue. 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  He has coronary artery disease which has been stable.  Wt Readings from Last 3 Encounters:  12/10/12 189 lb (85.73 kg)  10/15/12 186 lb (84.369 kg)  09/08/12 185 lb (83.915 kg)    Review of Systems  Constitutional: Positive for fatigue. Negative for fever, chills and appetite change.  HENT: Negative for hearing loss, ear pain, congestion, sore throat, trouble swallowing, neck stiffness, dental problem, voice change and tinnitus.   Eyes: Negative for pain, discharge and visual disturbance.  Respiratory: Negative for cough, chest tightness, wheezing and stridor.   Cardiovascular: Negative for chest pain, palpitations and leg swelling.  Gastrointestinal: Negative for nausea, vomiting, abdominal pain, diarrhea, constipation, blood in stool and abdominal distention.  Genitourinary: Negative for urgency, hematuria, flank pain, discharge, difficulty urinating and genital sores.  Musculoskeletal: Positive for joint swelling, arthralgias and gait problem. Negative for myalgias and back pain.  Skin: Negative for rash.  Neurological: Positive for weakness. Negative for dizziness, syncope, speech difficulty, numbness and headaches.  Hematological: Negative for adenopathy. Does not bruise/bleed easily.  Psychiatric/Behavioral: Negative for behavioral problems and dysphoric mood. The patient is not nervous/anxious.        Objective:   Physical Exam  Constitutional: He is oriented to person, place, and time. He appears well-developed and well-nourished. No distress.  Blood pressure low normal Afebrile  HENT:  Head: Normocephalic.  Right Ear: External ear normal.  Left Ear: External ear normal.  Eyes: Conjunctivae and EOM are normal.  Neck: Normal range of motion.  Cardiovascular: Normal rate and normal heart sounds.   Pulmonary/Chest: Breath sounds normal.  Abdominal: Bowel sounds are normal.  Musculoskeletal: Normal range of motion. He exhibits no edema and no tenderness.  Range of motion of the right shoulder mildly limited and uncomfortable The right knee was slightly swollen warm and tender to touch Right ankle was unremarkable  Neurological: He is alert and oriented to person, place, and time.  Psychiatric: He has a normal mood and affect. His behavior is normal.          Assessment & Plan:   Diabetes mellitus. Hemoglobin A1c yesterday 6.0. We'll decrease Amaryl to 2 mg daily Hypertension well controlled Coronary artery disease stable Osteoarthritis. Patient has pain in the right shoulder right knee and right ankle. Patient does have a history of prostate cancer with bony metastases.  Will treat with Aleve and omeprazole side of protection and recheck in one month. May need a bone scan for further evaluation. Will review results of the abdominal and pelvic CT scan

## 2012-12-17 ENCOUNTER — Ambulatory Visit: Payer: Medicare Other | Admitting: Internal Medicine

## 2013-01-05 ENCOUNTER — Other Ambulatory Visit: Payer: Self-pay | Admitting: Urology

## 2013-01-05 ENCOUNTER — Encounter: Payer: Self-pay | Admitting: Internal Medicine

## 2013-01-05 ENCOUNTER — Ambulatory Visit (INDEPENDENT_AMBULATORY_CARE_PROVIDER_SITE_OTHER): Payer: Medicare Other | Admitting: Internal Medicine

## 2013-01-05 VITALS — BP 148/70 | HR 64 | Temp 97.9°F | Resp 20 | Wt 190.0 lb

## 2013-01-05 DIAGNOSIS — E1149 Type 2 diabetes mellitus with other diabetic neurological complication: Secondary | ICD-10-CM

## 2013-01-05 DIAGNOSIS — I10 Essential (primary) hypertension: Secondary | ICD-10-CM

## 2013-01-05 DIAGNOSIS — E119 Type 2 diabetes mellitus without complications: Secondary | ICD-10-CM

## 2013-01-05 DIAGNOSIS — C61 Malignant neoplasm of prostate: Secondary | ICD-10-CM

## 2013-01-05 DIAGNOSIS — M25569 Pain in unspecified knee: Secondary | ICD-10-CM

## 2013-01-05 NOTE — Progress Notes (Signed)
Subjective:    Patient ID: [name] [name], male    DOB: 05-19-1930, 79 y.o.   MRN: [id number]  HPI  79year-old patient who is seen today in followup.  He was seen approximately one month ago complaining of right shoulder right knee and right ankle pain. He is accompanied by his daughter who states that he had right hip not right ankle discomfort.  He has been self referred to chiropractic care which has not been helpful. He has been seen by urology recently and a followup PSA is pending. He is felt to have progressive metastatic prostate involvement and has been referred to medical oncology. A bone scan is scheduled for early next week. He continues to have chronic left leg discomfort but much improved compared to the past His diabetes has been well-controlled. He does monitor home blood sugar readings with nice results. One month ago hemoglobin A1c 6.0  Past Medical History  Diagnosis Date  . Hypertension   . Hyperlipidemia   . Memory loss     mild  . Rectal ulcer     RESOLVED  . CAD (coronary artery disease)     S/P CABG x 5 1998; S/P stent LCX 2009  . Constipation   . History of ventricular fibrillation 1998    while on treadmill  . Hypothyroidism   . IBS (irritable bowel syndrome)   . Colitis     RESOLVED  . ED (erectile dysfunction)   . Osteoporosis   . Depression   . Diabetes mellitus     fasting blood sugar 140-200 usually  . GERD (gastroesophageal reflux disease)   . Heart attack 2009  . Dementia   . Neuromuscular disorder     DIABETIC NEUROPATHY  . Prostate cancer   . Pain     LOWER BACK  -- PAST HX OF STEROID EPIDURALS FOR PAIN RELIEF  . 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 Marital Status: Widowed    Spouse Name: N/A    Number of Children: N/A  . Years of Education: N/A   Occupational  History  . Not on file.   Social History Main Topics  . Smoking status: Former Games developer  . Smokeless tobacco: Former Neurosurgeon    Quit date: 02/11/1948  . Alcohol Use: No  . Drug Use: No  . Sexually Active: Not on file   Other Topics Concern  . Not on file   Social History Narrative  . No narrative on file    Past Surgical History  Procedure Laterality Date  . Coronary artery bypass graft  1998    x5, with a left internal mammary to the LAD and diagonal, vein graft to the OM1 and 2, and a vein graft to the right coronary artery  . Coronary stent placement  11/13/2007    LCX  . Trigger finger repaired  2009  . Laparoscopic gastrectomy N/A 08/18/2012    Procedure: Laparoscopic Partial Gastrectomy;  Surgeon: Ardeth Sportsman, MD;  Location: [facility];  Service: General;  Laterality: N/A;  Laparoscopic Partial Gastrectomy,    Family History  Problem Relation Age of Onset  . Stroke Father   . Pneumonia Father   . Colon cancer Neg Hx     Allergies  Allergen Reactions  . Codeine Phosphate Other (See Comments)    mouth sores  Current Outpatient Prescriptions on File Prior to Visit  Medication Sig Dispense Refill  . ACCU-CHEK SOFTCLIX LANCETS lancets 1 each by Other route 2 (two) times daily as needed for other. Use as instructed  100 each  12  . aspirin EC 81 MG tablet Take 81 mg by mouth every morning.       [name] atorvastatin (LIPITOR) 10 MG tablet Take 10 mg by mouth every morning.       . bicalutamide (CASODEX) 50 MG tablet       . calcium carbonate-magnesium hydroxide (ROLAIDS) 334 MG CHEW Chew 1 tablet by mouth 2 (two) times daily as needed.      . Calcium Citrate-Vitamin D (CITRACAL + D PO) Take 1 tablet by mouth daily.      . Denosumab (XGEVA [place]) Inject into the skin every 30 (thirty) days. Injects "around the first part of each month."      . docusate sodium (COLACE) 50 MG capsule Take 50-100 mg by mouth 2 (two) times daily.       [name] donepezil (ARICEPT) 10 MG tablet Take 10 mg by  mouth daily.      [name] EXFORGE 10-320 MG per tablet TAKE ONE TABLET BY MOUTH EVERY DAY  90 tablet  1  . glimepiride (AMARYL) 4 MG tablet TAKE ONE TABLET BY MOUTH EVERY DAY BEFORE BREAKFAST  90 tablet  1  . glucose blood (ACCU-CHEK COMPACT TEST DRUM) test strip 1 each by Other route 2 (two) times daily as needed for other. Use as instructed  100 each  12  . leuprolide (LUPRON) 3.75 MG injection Inject 3.75 mg into the muscle every 4 (four) months.      . levothyroxine (SYNTHROID, LEVOTHROID) 50 MCG tablet Take 50 mcg by mouth every morning.       . metFORMIN (GLUCOPHAGE) 1000 MG tablet Take 500-1,000 mg by mouth 2 (two) times daily with a meal. Take 1,000mg  every morning and take 500mg  every evening.      . nitroGLYCERIN (NITROSTAT) 0.4 MG SL tablet Place 0.4 mg under the tongue every 5 (five) minutes as needed. For chest pain      . Nutritional Supplements (JUICE PLUS FIBRE PO) Take 6 capsules by mouth daily.       [name] omega-3 acid ethyl esters (LOVAZA) 1 G capsule Take 1 g by mouth 2 (two) times daily.      [name] oxybutynin (DITROPAN-XL) 10 MG 24 hr tablet Take 10 mg by mouth daily.       . pregabalin (LYRICA) 50 MG capsule Two po in the morning and one po in the evening  90 capsule  5  . sertraline (ZOLOFT) 50 MG tablet Take 0.5 tablets (25 mg total) by mouth at bedtime.  15 tablet  0  . sitaGLIPtin (JANUVIA) 100 MG tablet Take 100 mg by mouth at bedtime.       . [DISCONTINUED] chlorthalidone (HYGROTON) 25 MG tablet Take 12.5 mg by mouth daily.       No current facility-administered medications on file prior to visit.    BP 148/70  Pulse 64  Temp(Src) 97.9 F (36.6 C) (Oral)  Resp 20  Wt 190 lb (86.183 kg)  BMI 29.3 kg/m2  SpO2 97%       Review of Systems  Constitutional: Negative for fever, chills, appetite change and fatigue.  HENT: Negative for hearing loss, ear pain, congestion, sore throat, trouble swallowing, neck stiffness, dental problem, voice change and tinnitus.   Eyes:  Negative  for pain, discharge and visual disturbance.  Respiratory: Negative for cough, chest tightness, wheezing and stridor.   Cardiovascular: Negative for chest pain, palpitations and leg swelling.  Gastrointestinal: Negative for nausea, vomiting, abdominal pain, diarrhea, constipation, blood in stool and abdominal distention.  Genitourinary: Negative for urgency, hematuria, flank pain, discharge, difficulty urinating and genital sores.  Musculoskeletal: Positive for joint swelling, arthralgias and gait problem (now walks with a cane). Negative for myalgias and back pain.  Skin: Negative for rash.  Neurological: Negative for dizziness, syncope, speech difficulty, weakness, numbness and headaches.  Hematological: Negative for adenopathy. Does not bruise/bleed easily.  Psychiatric/Behavioral: Negative for behavioral problems and dysphoric mood. The patient is not nervous/anxious.        Objective:   Physical Exam  Constitutional: He appears well-developed and well-nourished. No distress.  Blood pressure 140/70  Musculoskeletal:  Right shoulder pain improved no pain with range of motion  He had a moderate effusion involving the right knee which was slightly warm to touch          Assessment & Plan:   Right knee pain. Patient has a bone scan scheduled in 6 days. Will review. Differential includes metastatic prostate cancer or probably more likely knee osteoarthritis or meniscal tear etc. Will likely need orthopedic referral but will wait on the results of the bone scan Diabetes mellitus stable. Hemoglobin A1c 1 month ago 6.0  Schedule complete exam in 2 months Followup medical oncology and urology Consider orthopedic referral next week depending on results of the bone scan

## 2013-01-05 NOTE — Patient Instructions (Addendum)
Please check your hemoglobin A1c every 3 months  Bone scan as scheduled next week  Followup urology and medical oncology  Orthopedic referral if unimproved

## 2013-01-07 ENCOUNTER — Telehealth: Payer: Self-pay | Admitting: Oncology

## 2013-01-07 NOTE — Telephone Encounter (Signed)
S/W PT IN RE NP APPT 07/16 @ 11 W/DR. SHADAD REFERRING DR. Retta Diones DX- PROSTATE CA WELCOME PACKET MAILED.   REFERRING OFFICE IS AWARE OF NP APPT.

## 2013-01-08 ENCOUNTER — Other Ambulatory Visit: Payer: Self-pay | Admitting: Oncology

## 2013-01-08 ENCOUNTER — Ambulatory Visit: Payer: Medicare Other | Admitting: Internal Medicine

## 2013-01-08 DIAGNOSIS — C61 Malignant neoplasm of prostate: Secondary | ICD-10-CM

## 2013-01-09 ENCOUNTER — Other Ambulatory Visit: Payer: Self-pay | Admitting: Internal Medicine

## 2013-01-11 ENCOUNTER — Encounter: Payer: Self-pay | Admitting: Dietician

## 2013-01-11 ENCOUNTER — Encounter (HOSPITAL_COMMUNITY)
Admission: RE | Admit: 2013-01-11 | Discharge: 2013-01-11 | Disposition: A | Discharge status: Home / Self Care | Payer: Medicare Other | Source: Ambulatory Visit | Attending: Urology | Admitting: Urology

## 2013-01-11 ENCOUNTER — Encounter: Payer: Medicare Other | Attending: Internal Medicine | Admitting: Dietician

## 2013-01-11 DIAGNOSIS — C61 Malignant neoplasm of prostate: Secondary | ICD-10-CM

## 2013-01-11 DIAGNOSIS — Z713 Dietary counseling and surveillance: Secondary | ICD-10-CM | POA: Insufficient documentation

## 2013-01-11 DIAGNOSIS — E1149 Type 2 diabetes mellitus with other diabetic neurological complication: Secondary | ICD-10-CM

## 2013-01-11 DIAGNOSIS — E119 Type 2 diabetes mellitus without complications: Secondary | ICD-10-CM | POA: Insufficient documentation

## 2013-01-11 MED ORDER — TECHNETIUM TC 99M MEDRONATE IV KIT
24.5000 | PACK | Freq: Once | INTRAVENOUS | Status: AC | PRN
  Administered 2013-01-11: 24.5 via INTRAVENOUS

## 2013-01-11 MED ORDER — FLUDEOXYGLUCOSE F - 18 (FDG) INJECTION: 24.5000 | Freq: Once | INTRAVENOUS | Status: DC | PRN

## 2013-01-11 NOTE — Progress Notes (Signed)
Medical Nutrition Therapy:  Appt start time: 1600 end time:  1700.  Assessment:  Primary concerns today: Type II DM. Recently very well controlled with HgbA1c 6.0 in June. In past 1.5 years, HgbA1c have been 6.8-7.8. Pt checks his BG before breakfast and this fluctuates between 90 and over 200. Rarely checks outside of AM. Pt would like to lose some weight as well, and states he is thinking about joining the diabetic version of [employer], a home meal delivery program emphasizing portion control. He states he follows no particular diet to control CHO or DM, and has never had formal education on the subject.  MEDICATIONS: see list.   DIETARY INTAKE: Usual eating pattern includes 3 meals and 0 snacks per day.  24-hr recall:  B ( AM): cereal (fiber one or grape nut flakes) with milk (skim) and a banana. Alternates this with 3 frozen pancakes with light syrup or jimmy dean sausage biscuit or egg and cheese croissant. No juice. Water and coffee (black)  Snk ( AM): none  L ( PM): salmon croquets with peas, jell-o with fruit, one piece cornbread, one glass sweet tea. Often fast food- taco bell 2 hard tacos, one burrito, sweet tea Snk ( PM): none D ( PM): K [employer] 3 vegetables with water (peas, corn, green beans, cabbage, sweet potato). On TuW, son will bring over a dinner, with half an ice cream bar for dessert. Snk ( PM): none Beverages: coffee, sweet tea, water  Usual physical activity: was going to the [facility], but had to discontinue due to stomach surgery in February. Some PT at house since then. Pt c/o knee and shoulder pain of unknown origin. Pt also has prostate cancer metastasized to hip. He has had surgeries for trigger finger. He does some work in the garden, but not in last 2 weeks.   Progress Towards Goal(s):  In progress.   Nutritional Diagnosis:  NB-1.1 Food and nutrition-related knowledge deficit As related to type II DM, weight control.  As evidenced by pt statements of no prior education,  high intake of sweetened beverages.    Intervention:  Nutrition education provided regarding pathophysiology of type II DM, CHO counting, checking BG, and importance of physical activity in controlling type II DM. RD assigned the pt a CHO controlled meal plan which would promote weight loss, but pt is unsure he will be able to effectively follow it. RD also demonstrated the Plate Method to control portions (1/2 plate non-starchy vegetables, 1/4 plate CHO food, 1/4 plate protein). RD recommended swimming as a primary form of exercise to limit knee and shoulder pain.  Handouts given during visit include:  Living Well with Diabetes  Monitoring/Evaluation:  Dietary intake, exercise, portion control, HgbA1c, and body weight in 3 month(s).

## 2013-01-12 ENCOUNTER — Telehealth: Payer: Self-pay | Admitting: Oncology

## 2013-01-12 NOTE — Telephone Encounter (Signed)
C/D 01/12/13 for appt. 01/13/13

## 2013-01-13 ENCOUNTER — Other Ambulatory Visit (HOSPITAL_BASED_OUTPATIENT_CLINIC_OR_DEPARTMENT_OTHER): Payer: Medicare Other | Admitting: Lab

## 2013-01-13 ENCOUNTER — Ambulatory Visit: Payer: Medicare Other

## 2013-01-13 ENCOUNTER — Ambulatory Visit (HOSPITAL_BASED_OUTPATIENT_CLINIC_OR_DEPARTMENT_OTHER): Payer: Medicare Other | Admitting: Oncology

## 2013-01-13 ENCOUNTER — Telehealth: Payer: Self-pay | Admitting: Oncology

## 2013-01-13 ENCOUNTER — Encounter: Payer: Self-pay | Admitting: Oncology

## 2013-01-13 VITALS — BP 138/70 | HR 44 | Temp 96.7°F | Resp 17 | Ht 67.5 in | Wt 192.0 lb

## 2013-01-13 DIAGNOSIS — C61 Malignant neoplasm of prostate: Secondary | ICD-10-CM

## 2013-01-13 DIAGNOSIS — M25519 Pain in unspecified shoulder: Secondary | ICD-10-CM

## 2013-01-13 DIAGNOSIS — C7951 Secondary malignant neoplasm of bone: Secondary | ICD-10-CM

## 2013-01-13 DIAGNOSIS — M25569 Pain in unspecified knee: Secondary | ICD-10-CM

## 2013-01-13 LAB — COMPREHENSIVE METABOLIC PANEL (CC13)
ALT: 15 U/L (ref 0–55)
AST: 13 U/L (ref 5–34)
Albumin: 3.9 g/dL (ref 3.5–5.0)
CO2: 23 mEq/L (ref 22–29)
Calcium: 9.5 mg/dL (ref 8.4–10.4)
Chloride: 105 mEq/L (ref 98–109)
Potassium: 4.8 mEq/L (ref 3.5–5.1)

## 2013-01-13 LAB — CBC WITH DIFFERENTIAL/PLATELET
BASO%: 0.5 % (ref 0.0–2.0)
Basophils Absolute: 0 10*3/uL (ref 0.0–0.1)
EOS%: 4.4 % (ref 0.0–7.0)
HGB: 11.2 g/dL — ABNORMAL LOW (ref 13.0–17.1)
MCH: 31.1 pg (ref 27.2–33.4)
MONO#: 0.4 10*3/uL (ref 0.1–0.9)
RDW: 14.3 % (ref 11.0–14.6)
WBC: 4.8 10*3/uL (ref 4.0–10.3)
lymph#: 1 10*3/uL (ref 0.9–3.3)

## 2013-01-13 MED ORDER — ABIRATERONE ACETATE 250 MG PO TABS: 1000.0000 mg | ORAL_TABLET | Freq: Every day | ORAL | Status: DC

## 2013-01-13 NOTE — Telephone Encounter (Signed)
gv and printed appt sched and avs for pt  °

## 2013-01-13 NOTE — Progress Notes (Signed)
Per dr Clelia Croft, script for zytiga, given to Axel Filler in managed care, for financial assistance

## 2013-01-13 NOTE — Progress Notes (Signed)
Checked in new patient. No financial issues. We have email address and I gave mchart letter. Didn't ask if POA/living will.

## 2013-01-13 NOTE — Progress Notes (Signed)
Reason for Referral: Prostate cancer.   HPI: [name] [name] is a pleasant 79 year old gentleman currently of [place] where he lived [name] majority of his life. He has a history of prostate cancer that dates back to 2006. He presented with a PSA of 6.26 and underwent a biopsy on 12/18/2004 which showed a Gleason score 3+4 equals 7 as well as a pattern of 4+3 equals 7 on [name] left side. He was treated initially with Lupron and subsequently with radiotherapy completed on March of 2007. He had an excellent PSA response with 2 close to an undetectable level. Subsequently, [name] started developing diffuse back pain and found to have an elevated PSA up to 28.3 in September of 2012th. At that time he was found to have T 9 abnormality indicating osseous metastasis at that time.he was treated with combined androgen deprivation with Lupron and Casodex with an excellent PSA response down to 0.19 on November of 2012. His PSA had been under excellent control till February of 2014 where his PSA was up to 1.67 and most recently was up to 3.06 in July of 2014. He had a repeat bone scan on 01/11/2013 which showed a increased activity in T9 and T7 as well as a new left iliac bone activity. He has been on Xgeva and have tolerated it very well. 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  Clinically, [name] [name] is mildly symptomatic. He does not report any back pain or hip pain. He does report a right-sided knee pain and swelling. Does report a right sided shoulder pain. He does not report any neurological symptoms or incontinence. Does not report any weight loss or appetite changes. He does not report any hematuria or any other genitourinary complaints.   Past Medical History  Diagnosis Date  . Hypertension   . Hyperlipidemia   . Memory loss     mild  . Rectal ulcer     RESOLVED  . CAD (coronary artery disease)     S/P CABG x 5 1998; S/P stent LCX 2009  . Constipation   .  History of ventricular fibrillation 1998    while on treadmill  . Hypothyroidism   . IBS (irritable bowel syndrome)   . Colitis     RESOLVED  . ED (erectile dysfunction)   . Osteoporosis   . Depression   . Diabetes mellitus     fasting blood sugar 140-200 usually  . GERD (gastroesophageal reflux disease)   . Heart attack 2009  . Dementia   . Neuromuscular disorder     DIABETIC NEUROPATHY  . Prostate cancer   . Pain     LOWER BACK  -- PAST HX OF STEROID EPIDURALS FOR PAIN RELIEF  . 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 Coronary artery bypass graft  1998    x5, with a left internal mammary to [name] LAD and diagonal, vein graft to [name] OM1 and 2, and a vein graft to [name] right coronary artery  . Coronary stent placement  11/13/2007    LCX  . Trigger finger repaired  2009  . Laparoscopic gastrectomy N/A 08/18/2012    Procedure: Laparoscopic Partial Gastrectomy;  Surgeon: Ardeth Sportsman, MD;  Location: [facility];  Service: General;  Laterality: N/A;  Laparoscopic Partial Gastrectomy,  :  Current Outpatient Prescriptions  Medication Sig Dispense Refill  . ACCU-CHEK SOFTCLIX LANCETS lancets 1 each by Other route 2 (two) times daily as needed for other. Use as instructed  100 each  12  . acetaminophen (TYLENOL) 500 MG tablet Take 1,000 mg by mouth every 6 (six) hours as needed for pain.      [name] aspirin EC 81 MG tablet Take 81 mg by mouth every morning.       [name] atorvastatin (LIPITOR) 10 MG tablet TAKE ONE TABLET BY MOUTH EVERY DAY  90 tablet  0  . bicalutamide (CASODEX) 50 MG tablet       . calcium carbonate-magnesium hydroxide (ROLAIDS) 334 MG CHEW Chew 1 tablet by mouth 2 (two) times daily as needed.      . Calcium Citrate-Vitamin D (CITRACAL + D PO) Take 1 tablet by mouth daily.      . Denosumab  (XGEVA [place]) Inject into [name] skin every 30 (thirty) days. Injects "around [name] first part of each month."      . docusate sodium (COLACE) 50 MG capsule Take 50-100 mg by mouth 2 (two) times daily.       [name] donepezil (ARICEPT) 10 MG tablet Take 10 mg by mouth daily.      [name] EXFORGE 10-320 MG per tablet TAKE ONE TABLET BY MOUTH EVERY DAY  90 tablet  1  . glimepiride (AMARYL) 4 MG tablet TAKE ONE TABLET BY MOUTH EVERY DAY BEFORE BREAKFAST  90 tablet  1  . glucose blood (ACCU-CHEK COMPACT TEST DRUM) test strip 1 each by Other route 2 (two) times daily as needed for other. Use as instructed  100 each  12  . leuprolide (LUPRON) 3.75 MG injection Inject 3.75 mg into [name] muscle every 4 (four) months.      . levothyroxine (SYNTHROID, LEVOTHROID) 50 MCG tablet Take 50 mcg by mouth every morning.       . metFORMIN (GLUCOPHAGE) 1000 MG tablet Take 500-1,000 mg by mouth 2 (two) times daily with a meal. Take 1,000mg  every morning and take 500mg  every evening.      . nitroGLYCERIN (NITROSTAT) 0.4 MG SL tablet Place 0.4 mg under [name] tongue every 5 (five) minutes as needed. For chest pain      . Nutritional Supplements (JUICE PLUS FIBRE PO) Take 6 capsules by mouth daily.       [name] omega-3 acid ethyl esters (LOVAZA) 1 G capsule Take 1 g by mouth 2 (two) times daily.      [name] oxybutynin (DITROPAN-XL) 10 MG 24 hr tablet Take 10 mg by mouth daily.       . pregabalin (LYRICA) 50 MG capsule Two po in [name] morning and one po in [name] evening  90 capsule  5  . sertraline (ZOLOFT) 50 MG tablet Take 0.5 tablets (25 mg total) by mouth at bedtime.  15 tablet  0  . sitaGLIPtin (JANUVIA) 100 MG tablet Take 50 mg by mouth 2 (two) times daily.       [name] abiraterone Acetate (ZYTIGA) 250 MG tablet Take 4 tablets (1,000 mg total) by mouth daily. Take on an empty stomach 1 hour before or 2 hours after a meal  120 tablet  0  . [DISCONTINUED] chlorthalidone (HYGROTON) 25 MG tablet Take 12.5 mg by mouth daily.       No current facility-administered  medications for this visit.      Allergies  Allergen Reactions  . Codeine Phosphate Other (See Comments)    mouth sores  :  Family History  Problem Relation Age of Onset  . Stroke Father   . Pneumonia Father   . Colon cancer Neg Hx   :  History   Social History  . Marital Status: Widowed    Spouse Name: N/A    Number of Children: N/A  . Years of Education: N/A   Occupational History  . Not on file.   Social History Main Topics  . Smoking status: Former Games developer  . Smokeless tobacco: Former Neurosurgeon    Quit date: 02/11/1948  . Alcohol Use: No  . Drug Use: No  . Sexually Active: Not on file   Other Topics Concern  . Not on file   Social History Narrative  . No narrative on file  :  A comprehensive review of systems was negative.  Exam: ECOG 1 Blood pressure 138/70, pulse 44, temperature 96.7 F (35.9 C), temperature source Oral, resp. rate 17, height 5' 7.5" (1.715 m), weight 192 lb (87.091 kg). General appearance: alert, cooperative and appears stated age Nose: Nares normal. Septum midline. Mucosa normal. No drainage or sinus tenderness. Throat: lips, mucosa, and tongue normal; teeth and gums normal Neck: no adenopathy, no carotid bruit, no JVD, supple, symmetrical, trachea midline and thyroid not enlarged, symmetric, no tenderness/mass/nodules Back: negative Resp: clear to auscultation bilaterally Chest wall: no tenderness Cardio: regular rate and rhythm, S1, S2 normal, no murmur, click, rub or gallop GI: soft, non-tender; bowel sounds normal; no masses,  no organomegaly Extremities: extremities normal, atraumatic, no cyanosis or edema Pulses: 2+ and symmetric Skin: Skin color, texture, turgor normal. No rashes or lesions Lymph nodes: Cervical, supraclavicular, and axillary nodes normal.   Recent Labs  01/13/13 1126  WBC 4.8  HGB 11.2*  HCT 32.2*  PLT 134*   Nm Bone Scan Whole Body  01/11/2013   *RADIOLOGY REPORT*  Clinical Data: 79 year old male  with prostate cancer.  Right knee and shoulder pain.  NUCLEAR MEDICINE WHOLE BODY BONE SCINTIGRAPHY  Technique:  Whole body anterior and posterior images were obtained approximately 3 hours after intravenous injection of radiopharmaceutical.  Radiopharmaceutical: 24.5MILLI CURIE TC-MDP TECHNETIUM TC 30M MEDRONATE IV KIT  Comparison: 12/20/2011 and earlier. CT abdomen and pelvis 02/24/2012.  Findings: Expected radiotracer activity in [name] kidneys and bladder.  Two new or increased foci of radiotracer activity in [name] thoracic spine, proximally T9 and T7.  [name] T9 level was abnormal and suggestive of osseous metastatic disease on [name] 02/24/2012 comparison.  New left medial iliac bone radiotracer activity, also corresponding to an area of abnormal sclerosis on 02/24/2012.  Elsewhere, [name] axial skeleton is within normal limits.  No suspicious finding in [name] visible appendicular skeleton.  IMPRESSION: New abnormal activity in [name] thoracic spine and medial left iliac bone compatible with metastatic disease, and two of these areas were abnormal on [name] 02/24/2012 CT abdomen and pelvis.   Original Report Authenticated By: Erskine Speed, M.D.    Assessment and Plan:   79 year old gentleman with [name] following issues:  1. 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 His initial diagnosis dating back to 2006 with a Gleason score 4+3 equals 7 and a PSA of 6.26. He was initially treated with radiation therapy and developed hormone sensitive advanced disease in 2012 and have been on combined androgen deprivation since then. I discussed [name] natural course of advanced prostate cancer and more specifically castration resistant disease as well as [name] treatment options.  I made it clear to [name] [name] and his daughter that this is incurable cancer and everything else we do from now on is merely palliative in nature. Options of treatments would  include second line hormonal manipulation with medication such as ketoconazole, Zytiga, Xtandi. Immunotherapies such as Provenge is also a possibility. And finally chemotherapy would also be an option that. Risks and benefits of all these treatments were discussed in detail and he elected to try Zytiga as a potential option. Complications of this medication were discussed today in detail including fatigue, GI toxicity and lower extremity edema or emphasized and written information was given to [name] [name] and his daughter. He is agreeable to proceed and we will check his PSA and liver function test after he has been on this medication for 2-4 weeks.  2. A bone health: I recommended continue next fever and calcium supplements.  3. Androgen depravation: I recommended continued Lupron and discontinue Casodex for [name] time being.

## 2013-01-14 ENCOUNTER — Encounter: Payer: Self-pay | Admitting: Oncology

## 2013-01-14 NOTE — Progress Notes (Signed)
Per [name] [name] [name] [name] patient was approved for [facility]  01/14/13-01/13/2014  11000.00. I will send to billing and medical records.

## 2013-01-14 NOTE — Progress Notes (Signed)
Sent votrient prescription to [facility].

## 2013-01-27 ENCOUNTER — Telehealth: Payer: Self-pay | Admitting: Internal Medicine

## 2013-01-27 NOTE — Telephone Encounter (Signed)
Left message on voicemail to call office.  

## 2013-01-27 NOTE — Telephone Encounter (Signed)
Pt would like a free glucometer machine. Pt stated brand does not matter

## 2013-01-29 MED ORDER — GLUCOSE BLOOD VI STRP: 1.0000 | ORAL_STRIP | Freq: Two times a day (BID) | Status: DC | PRN

## 2013-01-29 NOTE — Addendum Note (Signed)
Addended by: Jimmye Norman on: 01/29/2013 10:26 AM   Modules accepted: Orders

## 2013-01-29 NOTE — Telephone Encounter (Signed)
Spoke to pt told him I have an Loss adjuster, chartered that he can come by and pick up and I will send Rx for test strips to pharmacy.  Pt verbalized understanding.

## 2013-01-30 ENCOUNTER — Other Ambulatory Visit: Payer: Self-pay | Admitting: Internal Medicine

## 2013-02-02 ENCOUNTER — Telehealth: Payer: Self-pay | Admitting: Neurology

## 2013-02-02 NOTE — Telephone Encounter (Signed)
Transfer of care Dr. Love needs to be reassigned per Dr. Athar °

## 2013-02-03 NOTE — Telephone Encounter (Signed)
I was able to put Chisom back on the previous apt per Dr. Teofilo Pod schedule.

## 2013-02-15 ENCOUNTER — Encounter: Payer: Self-pay | Admitting: Internal Medicine

## 2013-02-15 ENCOUNTER — Other Ambulatory Visit: Payer: Self-pay | Admitting: Oncology

## 2013-02-19 ENCOUNTER — Ambulatory Visit (HOSPITAL_BASED_OUTPATIENT_CLINIC_OR_DEPARTMENT_OTHER): Payer: Medicare Other | Admitting: Oncology

## 2013-02-19 ENCOUNTER — Other Ambulatory Visit (HOSPITAL_BASED_OUTPATIENT_CLINIC_OR_DEPARTMENT_OTHER): Payer: Medicare Other | Admitting: Lab

## 2013-02-19 ENCOUNTER — Telehealth: Payer: Self-pay

## 2013-02-19 VITALS — BP 142/69 | HR 63 | Temp 99.0°F | Resp 17 | Ht 67.5 in | Wt 186.8 lb

## 2013-02-19 DIAGNOSIS — C61 Malignant neoplasm of prostate: Secondary | ICD-10-CM

## 2013-02-19 DIAGNOSIS — C7951 Secondary malignant neoplasm of bone: Secondary | ICD-10-CM

## 2013-02-19 LAB — CBC WITH DIFFERENTIAL/PLATELET
BASO%: 0.3 % (ref 0.0–2.0)
EOS%: 4.5 % (ref 0.0–7.0)
HCT: 35.5 % — ABNORMAL LOW (ref 38.4–49.9)
LYMPH%: 25.7 % (ref 14.0–49.0)
MCH: 30.7 pg (ref 27.2–33.4)
MCHC: 34.4 g/dL (ref 32.0–36.0)
MCV: 89.4 fL (ref 79.3–98.0)
MONO%: 9.6 % (ref 0.0–14.0)
NEUT%: 59.9 % (ref 39.0–75.0)
Platelets: 133 10*3/uL — ABNORMAL LOW (ref 140–400)

## 2013-02-19 LAB — COMPREHENSIVE METABOLIC PANEL (CC13)
ALT: 18 U/L (ref 0–55)
AST: 17 U/L (ref 5–34)
Alkaline Phosphatase: 74 U/L (ref 40–150)
CO2: 22 mEq/L (ref 22–29)
Creatinine: 1.5 mg/dL — ABNORMAL HIGH (ref 0.7–1.3)
Total Bilirubin: 1.03 mg/dL (ref 0.20–1.20)

## 2013-02-19 LAB — PSA: PSA: 2.63 ng/mL (ref <=4.00)

## 2013-02-19 NOTE — Progress Notes (Signed)
Hematology and Oncology Follow Up Visit  [name] [name] [id number] 1929/10/27 79 y.o. 02/19/2013 10:02 AM   Principle Diagnosis: 79 year old gentleman with castration resistant prostate cancer with metastatic disease to the bone. His initial diagnosis was in 2006 presented with a PSA of 6.26 and a Gleason score 4+3 equals 7   Prior Therapy: He is status post definitive therapy with radiation and androgen deprivation completed in 2007. Patient developed recurrent disease in 2012 with a PSA rise up to 28 and T 9 osseous metastasis and was treated with combined androgen deprivation with Lupron and Casodex. He had an excellent response with PSA nadir down to 0.19 the Most recently he developed a rise in his PSA up to 3.06 and July of 2014 despite castrate levels testosterone. His bone scan in July of 2014 confirm the presence of bony disease  Current therapy: Zytiga started in July of 2014. Rivka Barbara on a monthly basis given [facility]. Lupron given [facility].  Interim History: Patient presents today for a followup visit. He is a very nice man I saw on July of 2014 for the evaluation of castration resistant prostate cancer. At that time, his PSA was 3.06 with metastatic bony disease. He was started on Zytiga without any complications. He is not reporting any new bone pain at this time. He does report knee pain related to arthritis but no back pain or hip pain. Is not reporting any complications related to Zytiga. Does not report any fatigue or tiredness. Does not report any lower extremity swelling. Does not report any nausea or vomiting or any GI complications. He does report some occasional constipation and he has laxatives that he uses twice a week.  Medications: I have reviewed the patient's current medications.  Current Outpatient Prescriptions  Medication Sig Dispense Refill  . ACCU-CHEK SOFTCLIX LANCETS lancets 1 each by Other route 2 (two) times daily as needed for other. Use as  instructed  100 each  12  . acetaminophen (TYLENOL) 500 MG tablet Take 1,000 mg by mouth every 6 (six) hours as needed for pain.      [name] aspirin EC 81 MG tablet Take 81 mg by mouth every morning.       [name] atorvastatin (LIPITOR) 10 MG tablet TAKE ONE TABLET BY MOUTH EVERY DAY  90 tablet  0  . bicalutamide (CASODEX) 50 MG tablet       . calcium carbonate-magnesium hydroxide (ROLAIDS) 334 MG CHEW Chew 1 tablet by mouth 2 (two) times daily as needed.      . Calcium Citrate-Vitamin D (CITRACAL + D PO) Take 1 tablet by mouth daily.      . Denosumab (XGEVA [place]) Inject into the skin every 30 (thirty) days. Injects "around the first part of each month."      . docusate sodium (COLACE) 50 MG capsule Take 50-100 mg by mouth 2 (two) times daily.       [name] donepezil (ARICEPT) 10 MG tablet TAKE ONE TABLET BY MOUTH EVERY DAY  90 tablet  0  . EXFORGE 10-320 MG per tablet TAKE ONE TABLET BY MOUTH ONCE DAILY  90 tablet  0  . glimepiride (AMARYL) 4 MG tablet TAKE ONE TABLET BY MOUTH EVERY DAY BEFORE BREAKFAST  90 tablet  1  . glucose blood (ACCU-CHEK SMARTVIEW) test strip 1 each by Other route 2 (two) times daily as needed for other. Use as instructed  100 each  12  . leuprolide (LUPRON) 3.75 MG injection Inject 3.75 mg into the  muscle every 4 (four) months.      . levothyroxine (SYNTHROID, LEVOTHROID) 50 MCG tablet Take 50 mcg by mouth every morning.       . metFORMIN (GLUCOPHAGE) 1000 MG tablet Take 500-1,000 mg by mouth 2 (two) times daily with a meal. Take 1,000mg  every morning and take 500mg  every evening.      . nitroGLYCERIN (NITROSTAT) 0.4 MG SL tablet Place 0.4 mg under the tongue every 5 (five) minutes as needed. For chest pain      . Nutritional Supplements (JUICE PLUS FIBRE PO) Take 6 capsules by mouth daily.       [name] omega-3 acid ethyl esters (LOVAZA) 1 G capsule Take 1 g by mouth 2 (two) times daily.      [name] oxybutynin (DITROPAN-XL) 10 MG 24 hr tablet Take 10 mg by mouth daily.       . pregabalin (LYRICA) 50  MG capsule Two po in the morning and one po in the evening  90 capsule  5  . sertraline (ZOLOFT) 50 MG tablet Take 0.5 tablets (25 mg total) by mouth at bedtime.  15 tablet  0  . sitaGLIPtin (JANUVIA) 100 MG tablet Take 50 mg by mouth 2 (two) times daily.       [name] ZYTIGA 250 MG tablet TAKE 4 TABLETS BY MOUTH DAILY. TAKE ON EMPTY STOMACH 1 HOUR BEFORE OR 2 HOURS AFTER MEALS  120 tablet  0  . [DISCONTINUED] chlorthalidone (HYGROTON) 25 MG tablet Take 12.5 mg by mouth daily.       No current facility-administered medications for this visit.     Allergies:  Allergies  Allergen Reactions  . Codeine Phosphate Other (See Comments)    mouth sores    Past Medical History, Surgical history, Social history, and Family History were reviewed and updated.  Review of Systems:  Remaining ROS negative. Physical Exam: Blood pressure 142/69, pulse 63, temperature 99 F (37.2 C), temperature source Oral, resp. rate 17, height 5' 7.5" (1.715 m), weight 186 lb 12.8 oz (84.732 kg), SpO2 98.00%. ECOG: 1  General appearance: alert Head: Normocephalic, without obvious abnormality, atraumatic Neck: no adenopathy, no carotid bruit, no JVD, supple, symmetrical, trachea midline and thyroid not enlarged, symmetric, no tenderness/mass/nodules Lymph nodes: Cervical, supraclavicular, and axillary nodes normal. Heart:regular rate and rhythm, S1, S2 normal, no murmur, click, rub or gallop Lung:chest clear, no wheezing, rales, normal symmetric air entry Abdomin: soft, non-tender, without masses or organomegaly EXT:no erythema, induration, or nodules   Lab Results: Lab Results  Component Value Date   WBC 3.5* 02/19/2013   HGB 12.2* 02/19/2013   HCT 35.5* 02/19/2013   MCV 89.4 02/19/2013   PLT 133* 02/19/2013     Chemistry      Component Value Date/Time   NA 142 02/19/2013 0919   NA 134* 08/19/2012 0405   K 3.9 02/19/2013 0919   K 4.0 08/19/2012 0405   CL 101 08/19/2012 0405   CO2 22 02/19/2013 0919   CO2 24  08/19/2012 0405   BUN 17.2 02/19/2013 0919   BUN 15 08/19/2012 0405   CREATININE 1.5* 02/19/2013 0919   CREATININE 1.15 08/19/2012 0405   CREATININE 1.38* 02/11/2012 1614      Component Value Date/Time   CALCIUM 10.3 02/19/2013 0919   CALCIUM 8.4 08/19/2012 0405   ALKPHOS 74 02/19/2013 0919   ALKPHOS 43 08/31/2011 0245   AST 17 02/19/2013 0919   AST 14 08/31/2011 0245   ALT 18 02/19/2013 0919   ALT 14  08/31/2011 0245   BILITOT 1.03 02/19/2013 0919   BILITOT 0.5 08/31/2011 0245        Impression and Plan:  79 year old gentleman with the following issues:  1. 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 His initial diagnosis dating back to 2006 with a PSA of 6.26 and a Gleason score of 7. He is currently on Zytiga since July of 2014 and tolerating it well without any complications. The plan is to continue on the current medication and dosing schedule without any changes. And we will monitor her his PSA and electrolytes monthly. We will be checking his liver function tests on a monthly basis as well.  2. Bony disease: He is receiving at [facility].  3. Hormonal deprivation: He is receiving Lupron at [facility].  4. Followup: In one month to assess his clinical status and laboratory data.   [facility], MD 8/22/201410:02 AM

## 2013-02-19 NOTE — Telephone Encounter (Signed)
gv pt appt schedule for September.  °

## 2013-02-21 ENCOUNTER — Encounter (HOSPITAL_COMMUNITY): Payer: Self-pay | Admitting: Emergency Medicine

## 2013-02-21 ENCOUNTER — Emergency Department (HOSPITAL_COMMUNITY): Payer: Medicare Other

## 2013-02-21 ENCOUNTER — Observation Stay (HOSPITAL_COMMUNITY)
Admission: EM | Admit: 2013-02-21 | Discharge: 2013-02-22 | Disposition: A | Discharge status: Home / Self Care | Payer: Medicare Other | Attending: Internal Medicine | Admitting: Internal Medicine

## 2013-02-21 DIAGNOSIS — Z951 Presence of aortocoronary bypass graft: Secondary | ICD-10-CM | POA: Insufficient documentation

## 2013-02-21 DIAGNOSIS — D6489 Other specified anemias: Secondary | ICD-10-CM

## 2013-02-21 DIAGNOSIS — Z8679 Personal history of other diseases of the circulatory system: Secondary | ICD-10-CM

## 2013-02-21 DIAGNOSIS — D61818 Other pancytopenia: Secondary | ICD-10-CM | POA: Insufficient documentation

## 2013-02-21 DIAGNOSIS — E1142 Type 2 diabetes mellitus with diabetic polyneuropathy: Secondary | ICD-10-CM | POA: Insufficient documentation

## 2013-02-21 DIAGNOSIS — R413 Other amnesia: Secondary | ICD-10-CM

## 2013-02-21 DIAGNOSIS — R682 Dry mouth, unspecified: Secondary | ICD-10-CM

## 2013-02-21 DIAGNOSIS — I129 Hypertensive chronic kidney disease with stage 1 through stage 4 chronic kidney disease, or unspecified chronic kidney disease: Secondary | ICD-10-CM | POA: Insufficient documentation

## 2013-02-21 DIAGNOSIS — R238 Other skin changes: Secondary | ICD-10-CM

## 2013-02-21 DIAGNOSIS — K626 Ulcer of anus and rectum: Secondary | ICD-10-CM

## 2013-02-21 DIAGNOSIS — N183 Chronic kidney disease, stage 3 unspecified: Secondary | ICD-10-CM | POA: Insufficient documentation

## 2013-02-21 DIAGNOSIS — Z8546 Personal history of malignant neoplasm of prostate: Secondary | ICD-10-CM

## 2013-02-21 DIAGNOSIS — E119 Type 2 diabetes mellitus without complications: Secondary | ICD-10-CM

## 2013-02-21 DIAGNOSIS — R748 Abnormal levels of other serum enzymes: Secondary | ICD-10-CM | POA: Insufficient documentation

## 2013-02-21 DIAGNOSIS — D509 Iron deficiency anemia, unspecified: Secondary | ICD-10-CM

## 2013-02-21 DIAGNOSIS — K5909 Other constipation: Secondary | ICD-10-CM

## 2013-02-21 DIAGNOSIS — R5381 Other malaise: Secondary | ICD-10-CM

## 2013-02-21 DIAGNOSIS — M549 Dorsalgia, unspecified: Secondary | ICD-10-CM

## 2013-02-21 DIAGNOSIS — N529 Male erectile dysfunction, unspecified: Secondary | ICD-10-CM

## 2013-02-21 DIAGNOSIS — C61 Malignant neoplasm of prostate: Secondary | ICD-10-CM

## 2013-02-21 DIAGNOSIS — Z9861 Coronary angioplasty status: Secondary | ICD-10-CM | POA: Insufficient documentation

## 2013-02-21 DIAGNOSIS — R0789 Other chest pain: Principal | ICD-10-CM | POA: Insufficient documentation

## 2013-02-21 DIAGNOSIS — Z79899 Other long term (current) drug therapy: Secondary | ICD-10-CM | POA: Insufficient documentation

## 2013-02-21 DIAGNOSIS — K648 Other hemorrhoids: Secondary | ICD-10-CM

## 2013-02-21 DIAGNOSIS — I10 Essential (primary) hypertension: Secondary | ICD-10-CM

## 2013-02-21 DIAGNOSIS — R233 Spontaneous ecchymoses: Secondary | ICD-10-CM

## 2013-02-21 DIAGNOSIS — E1149 Type 2 diabetes mellitus with other diabetic neurological complication: Secondary | ICD-10-CM

## 2013-02-21 DIAGNOSIS — D214 Benign neoplasm of connective and other soft tissue of abdomen: Secondary | ICD-10-CM

## 2013-02-21 DIAGNOSIS — E0842 Diabetes mellitus due to underlying condition with diabetic polyneuropathy: Secondary | ICD-10-CM | POA: Diagnosis present

## 2013-02-21 DIAGNOSIS — G3184 Mild cognitive impairment, so stated: Secondary | ICD-10-CM

## 2013-02-21 DIAGNOSIS — I259 Chronic ischemic heart disease, unspecified: Secondary | ICD-10-CM

## 2013-02-21 DIAGNOSIS — K594 Anal spasm: Secondary | ICD-10-CM

## 2013-02-21 DIAGNOSIS — F329 Major depressive disorder, single episode, unspecified: Secondary | ICD-10-CM

## 2013-02-21 DIAGNOSIS — K529 Noninfective gastroenteritis and colitis, unspecified: Secondary | ICD-10-CM

## 2013-02-21 DIAGNOSIS — K219 Gastro-esophageal reflux disease without esophagitis: Secondary | ICD-10-CM

## 2013-02-21 DIAGNOSIS — F32A Depression, unspecified: Secondary | ICD-10-CM

## 2013-02-21 DIAGNOSIS — K5730 Diverticulosis of large intestine without perforation or abscess without bleeding: Secondary | ICD-10-CM

## 2013-02-21 DIAGNOSIS — K589 Irritable bowel syndrome without diarrhea: Secondary | ICD-10-CM

## 2013-02-21 DIAGNOSIS — I2510 Atherosclerotic heart disease of native coronary artery without angina pectoris: Secondary | ICD-10-CM | POA: Diagnosis present

## 2013-02-21 DIAGNOSIS — R252 Cramp and spasm: Secondary | ICD-10-CM

## 2013-02-21 DIAGNOSIS — E039 Hypothyroidism, unspecified: Secondary | ICD-10-CM

## 2013-02-21 DIAGNOSIS — R5383 Other fatigue: Secondary | ICD-10-CM

## 2013-02-21 DIAGNOSIS — R079 Chest pain, unspecified: Secondary | ICD-10-CM | POA: Diagnosis present

## 2013-02-21 DIAGNOSIS — E785 Hyperlipidemia, unspecified: Secondary | ICD-10-CM

## 2013-02-21 DIAGNOSIS — M1990 Unspecified osteoarthritis, unspecified site: Secondary | ICD-10-CM

## 2013-02-21 LAB — CBC
HCT: 29.8 % — ABNORMAL LOW (ref 39.0–52.0)
HCT: 31.2 % — ABNORMAL LOW (ref 39.0–52.0)
Hemoglobin: 10.6 g/dL — ABNORMAL LOW (ref 13.0–17.0)
MCH: 31.5 pg (ref 26.0–34.0)
MCHC: 35.6 g/dL (ref 30.0–36.0)
MCHC: 35.3 g/dL (ref 30.0–36.0)
MCV: 89.4 fL (ref 78.0–100.0)
RDW: 14.8 % (ref 11.5–15.5)

## 2013-02-21 LAB — POCT I-STAT TROPONIN I: Troponin i, poc: 0.02 ng/mL (ref 0.00–0.08)

## 2013-02-21 LAB — HEPATIC FUNCTION PANEL
ALT: 18 U/L (ref 0–53)
AST: 18 U/L (ref 0–37)
Bilirubin, Direct: 0.2 mg/dL (ref 0.0–0.3)
Total Bilirubin: 0.5 mg/dL (ref 0.3–1.2)

## 2013-02-21 LAB — BASIC METABOLIC PANEL
BUN: 26 mg/dL — ABNORMAL HIGH (ref 6–23)
GFR calc non Af Amer: 44 mL/min — ABNORMAL LOW (ref >90)
Glucose, Bld: 163 mg/dL — ABNORMAL HIGH (ref 70–99)
Potassium: 3.8 mEq/L (ref 3.5–5.1)

## 2013-02-21 MED ORDER — ACETAMINOPHEN 325 MG PO TABS: 650.0000 mg | ORAL_TABLET | Freq: Four times a day (QID) | ORAL | Status: DC | PRN

## 2013-02-21 MED ORDER — ONDANSETRON HCL 4 MG/2ML IJ SOLN: 4.0000 mg | Freq: Three times a day (TID) | INTRAMUSCULAR | Status: DC | PRN

## 2013-02-21 MED ORDER — SODIUM CHLORIDE 0.9 % IV SOLN
1000.0000 mL | INTRAVENOUS | Status: DC
  Administered 2013-02-21: 1000 mL via INTRAVENOUS

## 2013-02-21 MED ORDER — LINAGLIPTIN 5 MG PO TABS
5.0000 mg | ORAL_TABLET | Freq: Every day | ORAL | Status: DC
  Administered 2013-02-22: 5 mg via ORAL
  Filled 2013-02-21: qty 1

## 2013-02-21 MED ORDER — AMLODIPINE BESYLATE-VALSARTAN 10-320 MG PO TABS: 1.0000 | ORAL_TABLET | Freq: Every day | ORAL | Status: DC

## 2013-02-21 MED ORDER — AMLODIPINE BESYLATE 10 MG PO TABS
10.0000 mg | ORAL_TABLET | Freq: Every day | ORAL | Status: DC
  Administered 2013-02-22: 10 mg via ORAL
  Filled 2013-02-21: qty 1

## 2013-02-21 MED ORDER — ACETAMINOPHEN 650 MG RE SUPP: 650.0000 mg | Freq: Four times a day (QID) | RECTAL | Status: DC | PRN

## 2013-02-21 MED ORDER — INSULIN ASPART 100 UNIT/ML ~~LOC~~ SOLN
0.0000 [IU] | Freq: Three times a day (TID) | SUBCUTANEOUS | Status: DC
  Administered 2013-02-22: 3 [IU] via SUBCUTANEOUS

## 2013-02-21 MED ORDER — PANTOPRAZOLE SODIUM 40 MG PO TBEC
40.0000 mg | DELAYED_RELEASE_TABLET | Freq: Every day | ORAL | Status: DC
  Administered 2013-02-22: 40 mg via ORAL
  Filled 2013-02-21: qty 1

## 2013-02-21 MED ORDER — ONDANSETRON HCL 4 MG/2ML IJ SOLN: 4.0000 mg | Freq: Four times a day (QID) | INTRAMUSCULAR | Status: DC | PRN

## 2013-02-21 MED ORDER — GLIMEPIRIDE 2 MG PO TABS
2.0000 mg | ORAL_TABLET | Freq: Every day | ORAL | Status: DC
  Administered 2013-02-22: 2 mg via ORAL
  Filled 2013-02-21 (×2): qty 1

## 2013-02-21 MED ORDER — OMEGA-3-ACID ETHYL ESTERS 1 G PO CAPS
1.0000 g | ORAL_CAPSULE | Freq: Two times a day (BID) | ORAL | Status: DC
  Administered 2013-02-21 – 2013-02-22 (×2): 1 g via ORAL
  Filled 2013-02-21 (×3): qty 1

## 2013-02-21 MED ORDER — ATORVASTATIN CALCIUM 10 MG PO TABS
10.0000 mg | ORAL_TABLET | Freq: Every day | ORAL | Status: DC
Start: 2013-02-22 — End: 2013-02-22
  Administered 2013-02-22: 10 mg via ORAL
  Filled 2013-02-21: qty 1

## 2013-02-21 MED ORDER — SODIUM CHLORIDE 0.9 % IJ SOLN
3.0000 mL | Freq: Two times a day (BID) | INTRAMUSCULAR | Status: DC
  Administered 2013-02-22: 3 mL via INTRAVENOUS

## 2013-02-21 MED ORDER — PREGABALIN 50 MG PO CAPS
50.0000 mg | ORAL_CAPSULE | Freq: Every day | ORAL | Status: DC
  Administered 2013-02-21: 50 mg via ORAL
  Filled 2013-02-21: qty 1

## 2013-02-21 MED ORDER — IRBESARTAN 300 MG PO TABS
300.0000 mg | ORAL_TABLET | Freq: Every day | ORAL | Status: DC
  Administered 2013-02-22: 300 mg via ORAL
  Filled 2013-02-21 (×2): qty 1

## 2013-02-21 MED ORDER — DOCUSATE SODIUM 50 MG PO CAPS
50.0000 mg | ORAL_CAPSULE | Freq: Two times a day (BID) | ORAL | Status: DC
  Filled 2013-02-21 (×3): qty 2

## 2013-02-21 MED ORDER — LEVOTHYROXINE SODIUM 50 MCG PO TABS
50.0000 ug | ORAL_TABLET | Freq: Every morning | ORAL | Status: DC
  Administered 2013-02-22: 50 ug via ORAL
  Filled 2013-02-21: qty 1

## 2013-02-21 MED ORDER — PREGABALIN 50 MG PO CAPS
100.0000 mg | ORAL_CAPSULE | Freq: Every day | ORAL | Status: DC
  Administered 2013-02-22: 100 mg via ORAL
  Filled 2013-02-21: qty 2

## 2013-02-21 MED ORDER — NITROGLYCERIN 0.4 MG SL SUBL
0.4000 mg | SUBLINGUAL_TABLET | SUBLINGUAL | Status: DC | PRN
  Administered 2013-02-21: 0.4 mg via SUBLINGUAL
  Filled 2013-02-21: qty 25

## 2013-02-21 MED ORDER — LEUPROLIDE ACETATE 3.75 MG IM KIT: 3.7500 mg | PACK | INTRAMUSCULAR | Status: DC

## 2013-02-21 MED ORDER — DONEPEZIL HCL 10 MG PO TABS
10.0000 mg | ORAL_TABLET | Freq: Every day | ORAL | Status: DC
Start: 2013-02-22 — End: 2013-02-22
  Administered 2013-02-22: 10 mg via ORAL
  Filled 2013-02-21 (×2): qty 1

## 2013-02-21 MED ORDER — ONDANSETRON HCL 4 MG PO TABS: 4.0000 mg | ORAL_TABLET | Freq: Four times a day (QID) | ORAL | Status: DC | PRN

## 2013-02-21 MED ORDER — ABIRATERONE ACETATE 250 MG PO TABS: 1000.0000 mg | ORAL_TABLET | Freq: Every day | ORAL | Status: DC

## 2013-02-21 MED ORDER — HEPARIN SODIUM (PORCINE) 5000 UNIT/ML IJ SOLN
5000.0000 [IU] | Freq: Three times a day (TID) | INTRAMUSCULAR | Status: DC
  Administered 2013-02-21 – 2013-02-22 (×2): 5000 [IU] via SUBCUTANEOUS
  Filled 2013-02-21 (×5): qty 1

## 2013-02-21 MED ORDER — ASPIRIN EC 325 MG PO TBEC
325.0000 mg | DELAYED_RELEASE_TABLET | Freq: Every day | ORAL | Status: DC
  Administered 2013-02-22: 325 mg via ORAL
  Filled 2013-02-21: qty 1

## 2013-02-21 MED ORDER — NITROGLYCERIN 0.4 MG SL SUBL: 0.4000 mg | SUBLINGUAL_TABLET | SUBLINGUAL | Status: DC | PRN

## 2013-02-21 MED ORDER — OXYBUTYNIN CHLORIDE ER 10 MG PO TB24
10.0000 mg | ORAL_TABLET | Freq: Every day | ORAL | Status: DC
  Administered 2013-02-22: 10 mg via ORAL
  Filled 2013-02-21: qty 1

## 2013-02-21 NOTE — ED Notes (Signed)
Patient transported to X-ray 

## 2013-02-21 NOTE — ED Provider Notes (Signed)
CSN: [id number]     Arrival date & time 02/21/13  1531 History     First MD Initiated Contact with Patient 02/21/13 1538     Chief Complaint  Patient presents with  . Chest Pain   (Consider location/radiation/quality/duration/timing/severity/associated sxs/prior Treatment) HPI  [name] [name] is a 79 y.o. male with past medical history significant for CAD. (status post CABG and stents) and active prostate cancer complaining of intermittent chest pain, left-sided nonradiating lasting seconds at a time worsening over the course of the last 7 hours. Patient describes the pain as "grasping." He cannot identify any exacerbating or alleviating factors. Patient denies fever, chills, shortness of breath, and nausea vomiting, diaphoresis, abdominal pain, change in bowel or bladder habits.   Past Medical History  Diagnosis Date  . Hypertension   . Hyperlipidemia   . Memory loss     mild  . Rectal ulcer     RESOLVED  . CAD (coronary artery disease)     S/P CABG x 5 1998; S/P stent LCX 2009  . Constipation   . History of ventricular fibrillation 1998    while on treadmill  . Hypothyroidism   . IBS (irritable bowel syndrome)   . Colitis     RESOLVED  . ED (erectile dysfunction)   . Osteoporosis   . Depression   . Diabetes mellitus     fasting blood sugar 140-200 usually  . GERD (gastroesophageal reflux disease)   . Heart attack 2009  . Dementia   . Neuromuscular disorder     DIABETIC NEUROPATHY  . Prostate cancer   . Pain     LOWER BACK  -- PAST HX OF STEROID EPIDURALS FOR PAIN RELIEF  . 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 Coronary artery bypass graft  1998    x5, with a left internal mammary to the LAD and diagonal, vein graft to the OM1 and 2, and a vein graft to the right coronary  artery  . Coronary stent placement  11/13/2007    LCX  . Trigger finger repaired  2009  . Laparoscopic gastrectomy N/A 08/18/2012    Procedure: Laparoscopic Partial Gastrectomy;  Surgeon: Ardeth Sportsman, MD;  Location: [facility];  Service: General;  Laterality: N/A;  Laparoscopic Partial Gastrectomy,   Family History  Problem Relation Age of Onset  . Stroke Father   . Pneumonia Father   . Colon cancer Neg Hx    History  Substance Use Topics  . Smoking status: Former Games developer  . Smokeless tobacco: Former Neurosurgeon    Quit date: 02/11/1948  . Alcohol Use: No    Review of Systems 10 systems reviewed and found to be negative, except as noted in the HPI   Allergies  Codeine phosphate  Home Medications   Current Outpatient Rx  Name  Route  Sig  Dispense  Refill  . ZYTIGA 250 MG tablet      TAKE 4 TABLETS BY MOUTH DAILY. TAKE ON EMPTY STOMACH 1 HOUR BEFORE OR 2 HOURS AFTER MEALS   120 tablet   0   . ACCU-CHEK SOFTCLIX LANCETS lancets   Other   1 each by Other route 2 (two) times daily as needed for other. Use as instructed   100 each   12     Dx: 250.00   . acetaminophen (  TYLENOL) 500 MG tablet   Oral   Take 1,000 mg by mouth every 6 (six) hours as needed for pain.         [name] aspirin EC 81 MG tablet   Oral   Take 81 mg by mouth every morning.          [name] atorvastatin (LIPITOR) 10 MG tablet      TAKE ONE TABLET BY MOUTH EVERY DAY   90 tablet   0   . calcium carbonate-magnesium hydroxide (ROLAIDS) 334 MG CHEW   Oral   Chew 1 tablet by mouth 2 (two) times daily as needed.         . Calcium Citrate-Vitamin D (CITRACAL + D PO)   Oral   Take 1 tablet by mouth daily.         . Denosumab (XGEVA [place])   Subcutaneous   Inject into the skin every 30 (thirty) days. Injects "around the first part of each month."         . docusate sodium (COLACE) 50 MG capsule   Oral   Take 50-100 mg by mouth 2 (two) times daily.          [name] donepezil (ARICEPT) 10 MG tablet       TAKE ONE TABLET BY MOUTH EVERY DAY   90 tablet   0   . EXFORGE 10-320 MG per tablet      TAKE ONE TABLET BY MOUTH ONCE DAILY   90 tablet   0   . glimepiride (AMARYL) 2 MG tablet   Oral   Take 2 mg by mouth daily before breakfast.         . glucose blood (ACCU-CHEK SMARTVIEW) test strip   Other   1 each by Other route 2 (two) times daily as needed for other. Use as instructed   100 each   12     DX: 250.00   . leuprolide (LUPRON) 3.75 MG injection   Intramuscular   Inject 3.75 mg into the muscle every 4 (four) months.         . levothyroxine (SYNTHROID, LEVOTHROID) 50 MCG tablet   Oral   Take 50 mcg by mouth every morning.          . metFORMIN (GLUCOPHAGE) 1000 MG tablet   Oral   Take 500-1,000 mg by mouth 2 (two) times daily with a meal. Take 1,000mg  every morning and take 500mg  every evening.         . nitroGLYCERIN (NITROSTAT) 0.4 MG SL tablet   Sublingual   Place 0.4 mg under the tongue every 5 (five) minutes as needed. For chest pain         . Nutritional Supplements (JUICE PLUS FIBRE PO)   Oral   Take 6 capsules by mouth daily.          [name] omega-3 acid ethyl esters (LOVAZA) 1 G capsule   Oral   Take 1 g by mouth 2 (two) times daily.         [name] oxybutynin (DITROPAN-XL) 10 MG 24 hr tablet   Oral   Take 10 mg by mouth daily.          . pregabalin (LYRICA) 50 MG capsule      Two po in the morning and one po in the evening   90 capsule   5     Pharmacy Fax [phone]   . sitaGLIPtin (JANUVIA) 100 MG tablet   Oral   Take 50  mg by mouth 2 (two) times daily.           BP 119/66  Pulse 52  Temp(Src) 98.2 F (36.8 C)  Resp 14  Ht 5\' 7"  (1.702 m)  Wt 186 lb (84.369 kg)  BMI 29.12 kg/m2  SpO2 99% Physical Exam  Nursing note and vitals reviewed. Constitutional: He is oriented to person, place, and time. He appears well-developed and well-nourished. No distress.  HENT:  Head: Normocephalic.  Mouth/Throat: Oropharynx is clear and moist.   Eyes: Conjunctivae and EOM are normal.  Neck: No JVD present.  Cardiovascular: Normal rate and intact distal pulses.   Pulmonary/Chest: Effort normal and breath sounds normal. No stridor. No respiratory distress. He has no wheezes. He has no rales. He exhibits no tenderness.  Abdominal: Soft. He exhibits no distension and no mass. There is no tenderness. There is no rebound and no guarding.  Musculoskeletal: Normal range of motion. He exhibits no edema.  Neurological: He is alert and oriented to person, place, and time.  Psychiatric: He has a normal mood and affect.    ED Course   Procedures (including critical care time)  Labs Reviewed  CBC - Abnormal; Notable for the following:    WBC 3.4 (*)    RBC 3.33 (*)    Hemoglobin 10.6 (*)    HCT 29.8 (*)    Platelets 128 (*)    All other components within normal limits  BASIC METABOLIC PANEL - Abnormal; Notable for the following:    Glucose, Bld 163 (*)    BUN 26 (*)    Creatinine, Ser 1.46 (*)    GFR calc non Af Amer 44 (*)    GFR calc Af Amer 51 (*)    All other components within normal limits  HEPATIC FUNCTION PANEL  LIPASE, BLOOD  POCT I-STAT TROPONIN I   Dg Chest 2 View  02/21/2013   *RADIOLOGY REPORT*  Clinical Data: Chest pain  CHEST - 2 VIEW  Comparison: 08/30/2011  Findings: The cardiac silhouette is normal in size and configuration.  The aorta is mildly uncoiled.  There are changes from previous CABG surgery, stable.  No mediastinal or hilar masses are noted.  Small stable nodule right mid lung likely a granuloma.  The lungs are otherwise clear.  There is no pleural effusion or pneumothorax.  The bony thorax is intact.  IMPRESSION: No acute cardiopulmonary disease.   Original Report Authenticated By: Amie Portland, M.D.    Date: 02/21/2013  Rate: 51  Rhythm: sinus bradycardia  QRS Axis: normal  Intervals: normal  ST/T Wave abnormalities: normal  Conduction Disutrbances:none and right bundle branch block  Narrative  Interpretation:   Old EKG Reviewed: unchanged  1. Chest pain     MDM   Filed Vitals:   02/21/13 1700 02/21/13 1730 02/21/13 1800 02/21/13 1830  BP: 137/63 140/65 143/84 135/59  Pulse: 52 51 51 51  Temp:      Resp: 14 16 14 17   Height:      Weight:      SpO2: 100% 100% 100% 100%     [name] [name] is a 79 y.o. male with history of CAD status post CABG and stent complaining of very atypical chest pain. EKG is nonischemic, troponin negative. Patient is anemic but at his baseline. He also has a chronic renal failure also his baseline. Chest x-ray is unremarkable.  Patient will be admitted for observation under the care of Dr. Toniann Fail.   Medications  0.9 %  sodium chloride infusion (1,000 mLs Intravenous New Bag/Given 02/21/13 1602)  nitroGLYCERIN (NITROSTAT) SL tablet 0.4 mg (0.4 mg Sublingual Given 02/21/13 1600)    Note: Portions of this report may have been transcribed using voice recognition software. Every effort was made to ensure accuracy; however, inadvertent computerized transcription errors may be present       Wynetta Emery, PA-C 02/21/13 1929

## 2013-02-21 NOTE — ED Notes (Addendum)
Pt states this morning felt CP, continued throughout the day. Describes it squeezing upper left chest area pain, 4-7 out of 10 pain. History of stents. Received 1 asa, 1 nitro from ems. Pain is intermittent so unsure if helped. History of triple bypass and stent. Denies SOB, denies N/V/D. Sinus brady on arrival.

## 2013-02-21 NOTE — H&P (Signed)
[facility] History and Physical  [name] [name] WUJ:[id number] DOB: Apr 03, 1930 DOA: 02/21/2013  Referring physician: ER physician. PCP: Rogelia Boga, MD  Specialists: [facility] cardiology.  Chief Complaint: Chest pain.  HPI: [name] [name] is a 79 y.o. 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 Patient has a history of CAD status post CABG and stent placement. Patient also had Myoview last year which was negative for ischemia or infarct. Patient's chest pain started off in the morning which was left lower chest. Pain was lasting for a few seconds and was stinging in nature nonradiating no associated shortness of breath diaphoresis nausea. Since happened multiple times patient decided to come to the ER. EKG cardiac markers and chest x-ray were unremarkable. Patient has been admitted for further observation. Presently patient is chest pain-free and nitroglycerin helped relieve the pain.  Review of Systems: As presented in the history of presenting illness, rest negative.  Past Medical History  Diagnosis Date  . Hypertension   . Hyperlipidemia   . Memory loss     mild  . Rectal ulcer     RESOLVED  . CAD (coronary artery disease)     S/P CABG x 5 1998; S/P stent LCX 2009  . Constipation   . History of ventricular fibrillation 1998    while on treadmill  . Hypothyroidism   . IBS (irritable bowel syndrome)   . Colitis     RESOLVED  . ED (erectile dysfunction)   . Osteoporosis   . Depression   . Diabetes mellitus     fasting blood sugar 140-200 usually  . GERD (gastroesophageal reflux disease)   . Heart attack 2009  . Dementia   . Neuromuscular disorder     DIABETIC NEUROPATHY  . Prostate cancer   . Pain     LOWER BACK  -- PAST HX OF STEROID EPIDURALS FOR PAIN RELIEF  . 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 Coronary artery bypass graft  1998    x5, with a left internal mammary to the LAD and diagonal, vein graft to the OM1 and 2, and a vein graft to the right coronary artery  . Coronary stent placement  11/13/2007    LCX  . Trigger finger repaired  2009  . Laparoscopic gastrectomy N/A 08/18/2012    Procedure: Laparoscopic Partial Gastrectomy;  Surgeon: Ardeth Sportsman, MD;  Location: [facility];  Service: General;  Laterality: N/A;  Laparoscopic Partial Gastrectomy,   Social History:  reports that he has quit smoking. He quit smokeless tobacco use about 65 years ago. He reports that he does not drink alcohol or use illicit drugs. Home. where does patient live-- Can do ADLs. Can patient participate in ADLs?  Allergies  Allergen Reactions  . Codeine Phosphate Other (See Comments)    mouth sores    Family History  Problem Relation Age of Onset  . Stroke Father   . Pneumonia Father   . Colon cancer Neg Hx       Prior to Admission medications   Medication Sig Start Date End Date Taking? Authorizing Provider  abiraterone Acetate (ZYTIGA) 250 MG tablet Take 1,000 mg by mouth daily. Take on an empty stomach 1 hour before or 2 hours after a meal   Yes Historical Provider, MD  acetaminophen (  TYLENOL) 500 MG tablet Take 1,000 mg by mouth every 6 (six) hours as needed for pain.   Yes Historical Provider, MD  amLODipine-valsartan (EXFORGE) 10-320 MG per tablet Take 1 tablet by mouth daily.   Yes Historical Provider, MD  aspirin EC 81 MG tablet Take 81 mg by mouth every morning.    Yes Historical Provider, MD  Aspirin Effervescent (ALKA-SELTZER PO) Take 1 tablet by mouth as needed (indigestion).   Yes Historical Provider, MD  atorvastatin (LIPITOR) 10 MG tablet Take 10 mg by mouth daily.   Yes Historical Provider, MD  calcium carbonate-magnesium hydroxide (ROLAIDS) 334 MG CHEW Chew 1 tablet by mouth  2 (two) times daily as needed (indigestion).    Yes Historical Provider, MD  Calcium Citrate-Vitamin D (CITRACAL + D PO) Take 1 tablet by mouth daily.   Yes Historical Provider, MD  Denosumab (XGEVA [place]) Inject into the skin every 30 (thirty) days. Injects "around the first part of each month."   Yes Historical Provider, MD  docusate sodium (COLACE) 50 MG capsule Take 50-100 mg by mouth 2 (two) times daily.    Yes Historical Provider, MD  donepezil (ARICEPT) 10 MG tablet Take 10 mg by mouth daily.   Yes Historical Provider, MD  esomeprazole (NEXIUM) 20 MG capsule Take 20 mg by mouth daily as needed (indigestion).   Yes Historical Provider, MD  glimepiride (AMARYL) 2 MG tablet Take 2 mg by mouth daily before breakfast.   Yes Historical Provider, MD  leuprolide (LUPRON) 3.75 MG injection Inject 3.75 mg into the muscle every 4 (four) months.   Yes Historical Provider, MD  levothyroxine (SYNTHROID, LEVOTHROID) 50 MCG tablet Take 50 mcg by mouth every morning.    Yes Historical Provider, MD  metFORMIN (GLUCOPHAGE) 1000 MG tablet Take 500-1,000 mg by mouth 2 (two) times daily with a meal. Take 1,000mg  every morning and take 500mg  every evening.   Yes Historical Provider, MD  nitroGLYCERIN (NITROSTAT) 0.4 MG SL tablet Place 0.4 mg under the tongue every 5 (five) minutes as needed. For chest pain   Yes Historical Provider, MD  Nutritional Supplements (JUICE PLUS FIBRE PO) Take 6 capsules by mouth daily.    Yes Historical Provider, MD  omega-3 acid ethyl esters (LOVAZA) 1 G capsule Take 1 g by mouth 2 (two) times daily.   Yes Historical Provider, MD  oxybutynin (DITROPAN-XL) 10 MG 24 hr tablet Take 10 mg by mouth daily.  07/08/12  Yes Historical Provider, MD  pregabalin (LYRICA) 50 MG capsule Take 50-100 mg by mouth See admin instructions. 100mg  in the morning and 50mg  in the evening   Yes Historical Provider, MD  sitaGLIPtin (JANUVIA) 50 MG tablet Take 50 mg by mouth 2 (two) times daily.   Yes Historical  Provider, MD  ACCU-CHEK SOFTCLIX LANCETS lancets 1 each by Other route 2 (two) times daily as needed for other. Use as instructed 10/15/12   Gordy Savers, MD  glucose blood (ACCU-CHEK SMARTVIEW) test strip 1 each by Other route 2 (two) times daily as needed for other. Use as instructed 01/29/13   Gordy Savers, MD   Physical Exam: Filed Vitals:   02/21/13 1730 02/21/13 1800 02/21/13 1830 02/21/13 1900  BP: 140/65 143/84 135/59 140/59  Pulse: 51 51 51 50  Temp:      Resp: 16 14 17 19   Height:      Weight:      SpO2: 100% 100% 100% 99%     General:  Well-developed and nourished.  Eyes: Anicteric no pallor.  ENT: No discharge from the ears eyes nose mouth.  Neck: No mass felt.  Cardiovascular: S1-S2 heard.  Respiratory: No rhonchi or crepitations.  Abdomen: Soft nontender bowel sounds present.  Skin: No rash.  Musculoskeletal: No edema.  Psychiatric: Appears normal.  Neurologic: Alert awake oriented to time place and person. Moves all extremities.  Labs on Admission:  Basic Metabolic Panel:  Recent Labs Lab 02/19/13 0919 02/21/13 1658  NA 142 137  K 3.9 3.8  CL  --  103  CO2 22 23  GLUCOSE 214* 163*  BUN 17.2 26*  CREATININE 1.5* 1.46*  CALCIUM 10.3 9.3   Liver Function Tests:  Recent Labs Lab 02/19/13 0919  AST 17  ALT 18  ALKPHOS 74  BILITOT 1.03  PROT 7.1  ALBUMIN 3.7   No results found for this basename: LIPASE, AMYLASE,  in the last 168 hours No results found for this basename: AMMONIA,  in the last 168 hours CBC:  Recent Labs Lab 02/19/13 0919 02/21/13 1658  WBC 3.5* 3.4*  NEUTROABS 2.1  --   HGB 12.2* 10.6*  HCT 35.5* 29.8*  MCV 89.4 89.5  PLT 133* 128*   Cardiac Enzymes: No results found for this basename: CKTOTAL, CKMB, CKMBINDEX, TROPONINI,  in the last 168 hours  BNP (last 3 results) No results found for this basename: PROBNP,  in the last 8760 hours CBG: No results found for this basename: GLUCAP,  in the last  168 hours  Radiological Exams on Admission: Dg Chest 2 View  02/21/2013   *RADIOLOGY REPORT*  Clinical Data: Chest pain  CHEST - 2 VIEW  Comparison: 08/30/2011  Findings: The cardiac silhouette is normal in size and configuration.  The aorta is mildly uncoiled.  There are changes from previous CABG surgery, stable.  No mediastinal or hilar masses are noted.  Small stable nodule right mid lung likely a granuloma.  The lungs are otherwise clear.  There is no pleural effusion or pneumothorax.  The bony thorax is intact.  IMPRESSION: No acute cardiopulmonary disease.   Original Report Authenticated By: Amie Portland, M.D.    EKG: Independently reviewed. Normal sinus rhythm. RBBB.  Assessment/Plan Principal Problem:   Chest pain Active Problems:   DIABETES MELLITUS, TYPE II   CORONARY ARTERY DISEASE   PROSTATE CANCER, HX OF   1. Chest pain with history of CAD status post CABG and stenting and normal Myoview last year - chest pain appears atypical. Cycle cardiac markers. When necessary nitroglycerin. Aspirin. If not cardiac pain could also be from metastatic disease. 2. Prostate cancer - per oncologist. 3. Diabetes mellitus type 2 - closely follow CBGs. 4. Anemia - follow CBC. 5. Hypertension - continue present medications. 6. Probable chronic kidney disease - follow metabolic panel intake output closely.    Code Status: Full code.  Family Communication: Patient's son at the bedside.  Disposition Plan: Admit for observation.    KAKRAKANDY,ARSHAD N. [facility] Pager [phone].  If 7PM-7AM, please contact night-coverage [email or web address] Password [facility] 02/21/2013, 8:31 PM

## 2013-02-22 ENCOUNTER — Other Ambulatory Visit: Payer: Self-pay | Admitting: Nurse Practitioner

## 2013-02-22 ENCOUNTER — Other Ambulatory Visit: Payer: Self-pay

## 2013-02-22 DIAGNOSIS — K219 Gastro-esophageal reflux disease without esophagitis: Secondary | ICD-10-CM

## 2013-02-22 DIAGNOSIS — I259 Chronic ischemic heart disease, unspecified: Secondary | ICD-10-CM

## 2013-02-22 DIAGNOSIS — R0789 Other chest pain: Secondary | ICD-10-CM

## 2013-02-22 DIAGNOSIS — R079 Chest pain, unspecified: Secondary | ICD-10-CM

## 2013-02-22 DIAGNOSIS — I2510 Atherosclerotic heart disease of native coronary artery without angina pectoris: Secondary | ICD-10-CM

## 2013-02-22 DIAGNOSIS — E119 Type 2 diabetes mellitus without complications: Secondary | ICD-10-CM

## 2013-02-22 LAB — BASIC METABOLIC PANEL
CO2: 24 mEq/L (ref 19–32)
Calcium: 9 mg/dL (ref 8.4–10.5)
Glucose, Bld: 155 mg/dL — ABNORMAL HIGH (ref 70–99)
Sodium: 139 mEq/L (ref 135–145)

## 2013-02-22 LAB — CBC
Hemoglobin: 10.1 g/dL — ABNORMAL LOW (ref 13.0–17.0)
MCH: 31.3 pg (ref 26.0–34.0)
MCV: 89.2 fL (ref 78.0–100.0)
Platelets: 125 10*3/uL — ABNORMAL LOW (ref 150–400)
RBC: 3.23 MIL/uL — ABNORMAL LOW (ref 4.22–5.81)

## 2013-02-22 LAB — GLUCOSE, CAPILLARY
Glucose-Capillary: 128 mg/dL — ABNORMAL HIGH (ref 70–99)
Glucose-Capillary: 209 mg/dL — ABNORMAL HIGH (ref 70–99)

## 2013-02-22 NOTE — Consult Note (Signed)
Referring Physician: Octavio Graves Primary Physician: Amador Cunas Primary Cardiologist: Antoine Poche Reason for Consultation: CP   HPI:  Mr. Osten is an 79 y/o male with h/o GIST s/p resection, castrate-resistant prostate CA, HTN, HL, DM2 and CAD who was admitted for CP.   He underwent CABG 1998. Last LHC 5/09: RCA proximal 90%, 95%, distally occluded, pCFX stents patent, OM1 30%, small left atrial branch 95%, oLAD occluded, S-dRCA patent, L-LAD/Dx patent, EF 60%. Medical therapy was continued at that time. He was evaluated in 3/13 in the hospital for chest pain. Lexiscan Myoview at that time demonstrated an EF of 63% and no scar or ischemia.   Has been doing well. Yesterday am had multiple episodes of very transient cramping L chest pain. Lasted only seconds. Came and wen throughout the day. Not worse with exertion. Went to visit new great grandson at the hospital yesterday afternoon and had another brief but more severe episode. Called paramedics. CP resolved b the time they got there got ASA x 4 and NTG. But CP already gone. In ER, ECG and CE normal. Has been ambulating room without recurrent symptoms. Baseline functional capacity limited by bad knee. Denies any HF or abdominal symptoms.     Review of Systems:     Cardiac Review of Systems: {Y] = yes [ ]  = no  Chest Pain [ y   ]  Resting SOB [   ] Exertional SOB  [  ]  Orthopnea [  ]   Pedal Edema [   ]    Palpitations [  ] Syncope  [  ]   Presyncope [   ]  General Review of Systems: [Y] = yes [  ]=no Constitional: recent weight change [  ]; anorexia [  ]; fatigue [  ]; nausea [  ]; night sweats [  ]; fever [  ]; or chills [  ];                                                                                                                                          Dental: poor dentition[  ];  Eye : blurred vision [  ]; diplopia [   ]; vision changes [  ];  Amaurosis fugax[  ]; Resp: cough [  ];  wheezing[  ];  hemoptysis[  ]; shortness of  breath[  ]; paroxysmal nocturnal dyspnea[  ]; dyspnea on exertion[  ]; or orthopnea[  ];  GI:  gallstones[  ], vomiting[  ];  dysphagia[  ]; melena[  ];  hematochezia [  ]; heartburn[  ];    GU: kidney stones [  ]; hematuria[  ];   dysuria [  ];  nocturia[  ];  history of     obstruction [  ];                 Skin: rash, swelling[  ];,  hair loss[  ];  peripheral edema[  ];  or itching[  ]; Musculosketetal: myalgias[  ];  joint swelling[  ];  joint erythema[  ];  joint pain[ y ];  back pain[  ];  Heme/Lymph: bruising[  ];  bleeding[  ];  anemia[  ];  Neuro: TIA[  ];  headaches[  ];  stroke[  ];  vertigo[  ];  seizures[  ];   paresthesias[  ];  difficulty walking[  ];  Psych:depression[  ]; anxiety[  ];  Endocrine: diabetes[  y];  thyroid dysfunction[  ];  Other:  Past Medical History  Diagnosis Date  . Hypertension   . Hyperlipidemia   . Memory loss     mild  . Rectal ulcer     RESOLVED  . CAD (coronary artery disease)     S/P CABG x 5 1998; S/P stent LCX 2009  . Constipation   . History of ventricular fibrillation 1998    while on treadmill  . Hypothyroidism   . IBS (irritable bowel syndrome)   . Colitis     RESOLVED  . ED (erectile dysfunction)   . Osteoporosis   . Depression   . Diabetes mellitus     fasting blood sugar 140-200 usually  . GERD (gastroesophageal reflux disease)   . Heart attack 2009  . Dementia   . Neuromuscular disorder     DIABETIC NEUROPATHY  . Prostate cancer   . Pain     LOWER BACK  -- PAST HX OF STEROID EPIDURALS FOR PAIN RELIEF  . GIST (gastrointestinal stromal tumor), 3cm, proximal stomach s/p lap resection Feb 2014 12/31/2011    POST-OPERATIVE DIAGNOSIS:  gastrointestinal stromal tumor of proximal stomach    Medications Prior to Admission  Medication Sig Dispense Refill  . abiraterone Acetate (ZYTIGA) 250 MG tablet Take 1,000 mg by mouth daily. Take on an empty stomach 1 hour before or 2 hours after a meal      . acetaminophen (TYLENOL) 500 MG  tablet Take 1,000 mg by mouth every 6 (six) hours as needed for pain.      [name] amLODipine-valsartan (EXFORGE) 10-320 MG per tablet Take 1 tablet by mouth daily.      [name] aspirin EC 81 MG tablet Take 81 mg by mouth every morning.       . Aspirin Effervescent (ALKA-SELTZER PO) Take 1 tablet by mouth as needed (indigestion).      [name] atorvastatin (LIPITOR) 10 MG tablet Take 10 mg by mouth daily.      . calcium carbonate-magnesium hydroxide (ROLAIDS) 334 MG CHEW Chew 1 tablet by mouth 2 (two) times daily as needed (indigestion).       . Calcium Citrate-Vitamin D (CITRACAL + D PO) Take 1 tablet by mouth daily.      . Denosumab (XGEVA [place]) Inject into the skin every 30 (thirty) days. Injects "around the first part of each month."      . docusate sodium (COLACE) 50 MG capsule Take 50-100 mg by mouth 2 (two) times daily.       [name] donepezil (ARICEPT) 10 MG tablet Take 10 mg by mouth daily.      [name] esomeprazole (NEXIUM) 20 MG capsule Take 20 mg by mouth daily as needed (indigestion).      [name] glimepiride (AMARYL) 2 MG tablet Take 2 mg by mouth daily before breakfast.      . leuprolide (LUPRON) 3.75 MG injection Inject 3.75 mg into the muscle every 4 (four) months.      [name]  levothyroxine (SYNTHROID, LEVOTHROID) 50 MCG tablet Take 50 mcg by mouth every morning.       . metFORMIN (GLUCOPHAGE) 1000 MG tablet Take 500-1,000 mg by mouth 2 (two) times daily with a meal. Take 1,000mg  every morning and take 500mg  every evening.      . nitroGLYCERIN (NITROSTAT) 0.4 MG SL tablet Place 0.4 mg under the tongue every 5 (five) minutes as needed. For chest pain      . Nutritional Supplements (JUICE PLUS FIBRE PO) Take 6 capsules by mouth daily.       [name] omega-3 acid ethyl esters (LOVAZA) 1 G capsule Take 1 g by mouth 2 (two) times daily.      [name] oxybutynin (DITROPAN-XL) 10 MG 24 hr tablet Take 10 mg by mouth daily.       . pregabalin (LYRICA) 50 MG capsule Take 50-100 mg by mouth See admin instructions. 100mg  in the morning and 50mg  in  the evening      . sitaGLIPtin (JANUVIA) 50 MG tablet Take 50 mg by mouth 2 (two) times daily.      [name] ACCU-CHEK SOFTCLIX LANCETS lancets 1 each by Other route 2 (two) times daily as needed for other. Use as instructed  100 each  12  . glucose blood (ACCU-CHEK SMARTVIEW) test strip 1 each by Other route 2 (two) times daily as needed for other. Use as instructed  100 each  12     . abiraterone Acetate  1,000 mg Oral Daily  . amLODipine  10 mg Oral Daily  . aspirin EC  325 mg Oral Daily  . atorvastatin  10 mg Oral Daily  . docusate sodium  50-100 mg Oral BID  . donepezil  10 mg Oral Daily  . glimepiride  2 mg Oral QAC breakfast  . heparin  5,000 Units Subcutaneous Q8H  . insulin aspart  0-9 Units Subcutaneous TID WC  . irbesartan  300 mg Oral Daily  . levothyroxine  50 mcg Oral q morning - 10a  . linagliptin  5 mg Oral Daily  . omega-3 acid ethyl esters  1 g Oral BID  . oxybutynin  10 mg Oral Daily  . pantoprazole  40 mg Oral Daily  . pregabalin  100 mg Oral Daily  . pregabalin  50 mg Oral QHS  . sodium chloride  3 mL Intravenous Q12H  . sodium chloride  3 mL Intravenous Q12H    Infusions:    Allergies  Allergen Reactions  . Codeine Phosphate Other (See Comments)    mouth sores    History   Social History  . Marital Status: Widowed    Spouse Name: N/A    Number of Children: N/A  . Years of Education: N/A   Occupational History  . Not on file.   Social History Main Topics  . Smoking status: Former Games developer  . Smokeless tobacco: Former Neurosurgeon    Quit date: 02/11/1948  . Alcohol Use: No  . Drug Use: No  . Sexual Activity: Not on file   Other Topics Concern  . Not on file   Social History Narrative  . No narrative on file    Family History  Problem Relation Age of Onset  . Stroke Father   . Pneumonia Father   . Colon cancer Neg Hx     PHYSICAL EXAM: Filed Vitals:   02/22/13 1211  BP: 156/67  Pulse: 45  Temp: 97.7 F (36.5 C)  Resp: 16    No intake  or  output data in the 24 hours ending 02/22/13 1256  General:  Well appearing. No respiratory difficulty HEENT: normal Neck: supple. no JVD. Carotids 2+ bilat; no bruits. No lymphadenopathy or thryomegaly appreciated. Cor: PMI nondisplaced. Regular rate & rhythm. No rubs, gallops or murmurs. Lungs: clear Abdomen: soft, nontender, nondistended. No hepatosplenomegaly. No bruits or masses. Good bowel sounds. Extremities: no cyanosis, clubbing, rash, edema Neuro: alert & oriented x 3, cranial nerves grossly intact. moves all 4 extremities w/o difficulty. Affect pleasant.  ECG: Sinus brady 48 . RBBB No ST-T wave abnormalities.    Results for orders placed during the hospital encounter of 02/21/13 (from the past 24 hour(s))  CBC     Status: Abnormal   Collection Time    02/21/13  4:58 PM      Result Value Range   WBC 3.4 (*) 4.0 - 10.5 K/uL   RBC 3.33 (*) 4.22 - 5.81 MIL/uL   Hemoglobin 10.6 (*) 13.0 - 17.0 g/dL   HCT 16.1 (*) 09.6 - 04.5 %   MCV 89.5  78.0 - 100.0 fL   MCH 31.8  26.0 - 34.0 pg   MCHC 35.6  30.0 - 36.0 g/dL   RDW 40.9  81.1 - 91.4 %   Platelets 128 (*) 150 - 400 K/uL  BASIC METABOLIC PANEL     Status: Abnormal   Collection Time    02/21/13  4:58 PM      Result Value Range   Sodium 137  135 - 145 mEq/L   Potassium 3.8  3.5 - 5.1 mEq/L   Chloride 103  96 - 112 mEq/L   CO2 23  19 - 32 mEq/L   Glucose, Bld 163 (*) 70 - 99 mg/dL   BUN 26 (*) 6 - 23 mg/dL   Creatinine, Ser 7.82 (*) 0.50 - 1.35 mg/dL   Calcium 9.3  8.4 - 95.6 mg/dL   GFR calc non Af Amer 44 (*) >90 mL/min   GFR calc Af Amer 51 (*) >90 mL/min  POCT I-STAT TROPONIN I     Status: None   Collection Time    02/21/13  5:11 PM      Result Value Range   Troponin i, poc 0.02  0.00 - 0.08 ng/mL   Comment 3           TROPONIN I     Status: None   Collection Time    02/21/13  8:47 PM      Result Value Range   Troponin I <0.30  <0.30 ng/mL  CBC     Status: Abnormal   Collection Time    02/21/13  8:47 PM        Result Value Range   WBC 3.7 (*) 4.0 - 10.5 K/uL   RBC 3.49 (*) 4.22 - 5.81 MIL/uL   Hemoglobin 11.0 (*) 13.0 - 17.0 g/dL   HCT 21.3 (*) 08.6 - 57.8 %   MCV 89.4  78.0 - 100.0 fL   MCH 31.5  26.0 - 34.0 pg   MCHC 35.3  30.0 - 36.0 g/dL   RDW 46.9  62.9 - 52.8 %   Platelets 129 (*) 150 - 400 K/uL  CREATININE, SERUM     Status: Abnormal   Collection Time    02/21/13  8:47 PM      Result Value Range   Creatinine, Ser 1.40 (*) 0.50 - 1.35 mg/dL   GFR calc non Af Amer 46 (*) >90 mL/min   GFR calc Af Denyse Dago  53 (*) >90 mL/min  GLUCOSE, CAPILLARY     Status: Abnormal   Collection Time    02/21/13  8:50 PM      Result Value Range   Glucose-Capillary 205 (*) 70 - 99 mg/dL  HEPATIC FUNCTION PANEL     Status: Abnormal   Collection Time    02/21/13  9:17 PM      Result Value Range   Total Protein 6.3  6.0 - 8.3 g/dL   Albumin 3.4 (*) 3.5 - 5.2 g/dL   AST 18  0 - 37 U/L   ALT 18  0 - 53 U/L   Alkaline Phosphatase 63  39 - 117 U/L   Total Bilirubin 0.5  0.3 - 1.2 mg/dL   Bilirubin, Direct 0.2  0.0 - 0.3 mg/dL   Indirect Bilirubin 0.3  0.3 - 0.9 mg/dL  LIPASE, BLOOD     Status: Abnormal   Collection Time    02/21/13  9:17 PM      Result Value Range   Lipase 137 (*) 11 - 59 U/L  TROPONIN I     Status: None   Collection Time    02/22/13  2:54 AM      Result Value Range   Troponin I <0.30  <0.30 ng/mL  BASIC METABOLIC PANEL     Status: Abnormal   Collection Time    02/22/13  2:54 AM      Result Value Range   Sodium 139  135 - 145 mEq/L   Potassium 3.8  3.5 - 5.1 mEq/L   Chloride 107  96 - 112 mEq/L   CO2 24  19 - 32 mEq/L   Glucose, Bld 155 (*) 70 - 99 mg/dL   BUN 27 (*) 6 - 23 mg/dL   Creatinine, Ser 1.61 (*) 0.50 - 1.35 mg/dL   Calcium 9.0  8.4 - 09.6 mg/dL   GFR calc non Af Amer 46 (*) >90 mL/min   GFR calc Af Amer 53 (*) >90 mL/min  CBC     Status: Abnormal   Collection Time    02/22/13  2:54 AM      Result Value Range   WBC 3.7 (*) 4.0 - 10.5 K/uL   RBC 3.23 (*)  4.22 - 5.81 MIL/uL   Hemoglobin 10.1 (*) 13.0 - 17.0 g/dL   HCT 04.5 (*) 40.9 - 81.1 %   MCV 89.2  78.0 - 100.0 fL   MCH 31.3  26.0 - 34.0 pg   MCHC 35.1  30.0 - 36.0 g/dL   RDW 91.4  78.2 - 95.6 %   Platelets 125 (*) 150 - 400 K/uL  TROPONIN I     Status: None   Collection Time    02/22/13  8:36 AM      Result Value Range   Troponin I <0.30  <0.30 ng/mL  GLUCOSE, CAPILLARY     Status: Abnormal   Collection Time    02/22/13  8:37 AM      Result Value Range   Glucose-Capillary 128 (*) 70 - 99 mg/dL  GLUCOSE, CAPILLARY     Status: Abnormal   Collection Time    02/22/13 11:43 AM      Result Value Range   Glucose-Capillary 209 (*) 70 - 99 mg/dL   Dg Chest 2 View  08/14/863   *RADIOLOGY REPORT*  Clinical Data: Chest pain  CHEST - 2 VIEW  Comparison: 08/30/2011  Findings: The cardiac silhouette is normal in size and configuration.  The  aorta is mildly uncoiled.  There are changes from previous CABG surgery, stable.  No mediastinal or hilar masses are noted.  Small stable nodule right mid lung likely a granuloma.  The lungs are otherwise clear.  There is no pleural effusion or pneumothorax.  The bony thorax is intact.  IMPRESSION: No acute cardiopulmonary disease.   Original Report Authenticated By: Amie Portland, M.D.     ASSESSMENT: 1. Chest pain, atypical 2. CAD s/p CABG '98, Myoview 3/13 normal 3. Prostate CA 4. Chronic renal failure, class III 5. DM2 6. Prostate CA 7. Elevated lipase 8. Pancytopenia    PLAN/DISCUSSION:  CP quite atypical. Likely not ischemic. ECG and CE are normal. Can go home today from our perspective. Given DM2, poor functional capacity and 79 y/o CABG grafts would recommend outpatient lexsican myoview to further assess. We will arrange. His lipase is mildly elevated but LFTs are normal. On exam does not have evidence of acute pancreatitis but may warrant RUQ u/s. Will leave this to discretion of primary team.   Truman Hayward 1:22 PM

## 2013-02-22 NOTE — Progress Notes (Signed)
Utilization review completed.  

## 2013-02-22 NOTE — Discharge Summary (Addendum)
Physician Discharge Summary  [name] [name] ZOX:[id number] DOB: 06-Sep-1929 DOA: 02/21/2013  PCP: Rogelia Boga, MD  Admit date: 02/21/2013 Discharge date: 02/22/2013  Recommendations for Outpatient Follow-up:  1. Pt will need to follow up with PCP in 2 weeks post discharge 2. Please obtain BMP to evaluate electrolytes and kidney function 3. Please also check CBC to evaluate Hg and Hct levels  Discharge Diagnoses:  Principal Problem:   Chest pain Active Problems:   DIABETES MELLITUS, TYPE II   CORONARY ARTERY DISEASE   PROSTATE CANCER, HX OF  angina -Appears stable, relieved with nitroglycerin SL -Consulted [place] [facility] -pt seen by Dr. Gala Romney who did not feel pt needed any additional testing -09/01/2011 Myoview negative, EF 63% -History of CAD with stent in 2009 -Continue aspirin -Continue statin Diabetes mellitus type 2 -continue trajenta and amaryl -ISS while in hospital -Patient was told to discontinue metformin due to his renal insufficiency. His estimated creatinine clearance is 45. -Patient was instructed to follow up with his primary care provider for adjustment of his diabetes regimen. Hypertension -Continue amlodipine and Avapro Hypothyroidism -Continue Synthroid Prostate cancer -continue antiandrogen tx--abiraterone -follow up Dr. Clelia Croft Elevated lipase -Not clinically significant -Patient is not having abdominal pain, and he is tolerating his diet Discharge Condition: stable  Disposition: home  Diet:heart healthy Wt Readings from Last 3 Encounters:  02/21/13 84.369 kg (186 lb)  02/19/13 84.732 kg (186 lb 12.8 oz)  01/13/13 87.091 kg (192 lb)    History of present illness:  79 y.o. 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 Patient has a history of CAD status post CABG and stent placement. Patient also had Myoview last year which was negative for ischemia or infarct. Patient's chest  pain started off in the morning which was left lower chest. Pain was lasting for a few seconds and was stinging in nature nonradiating no associated shortness of breath diaphoresis nausea. Since happened multiple times patient decided to come to the ER. EKG cardiac markers and chest x-ray were unremarkable. Patient has been admitted for further observation. Presently patient is chest pain-free and nitroglycerin helped relieve the pain. The patient denies any worsening shortness of breath or dyspnea on exertion. He states that he had recurrent chest discomfort that started at 6 AM on the day of admission and persisted throughout the day which prompted his coming to the emergency department her    Consultants: [place] cardiology  Discharge Exam: Filed Vitals:   02/22/13 0547  BP: 166/59  Pulse: 48  Temp: 97.9 F (36.6 C)  Resp: 16   Filed Vitals:   02/21/13 1830 02/21/13 1900 02/21/13 2054 02/22/13 0547  BP: 135/59 140/59 142/57 166/59  Pulse: 51 50 50 48  Temp:   97.7 F (36.5 C) 97.9 F (36.6 C)  TempSrc:   Oral   Resp: 17 19 16 16   Height:      Weight:      SpO2: 100% 99% 100% 99%   General: A&O x 3, NAD, pleasant, cooperative Cardiovascular: RRR, no rub, no gallop, no S3 Respiratory: CTAB, no wheeze, no rhonchi Abdomen:soft, nontender, nondistended, positive bowel sounds Extremities: No edema, No lymphangitis, no petechiae  Discharge Instructions   Future Appointments Provider Department Dept Phone   03/19/2013 11:15 AM Mauri Brooklyn [facility] MEDICAL ONCOLOGY [phone]   03/19/2013 11:45 AM Myrtis Ser, NP [facility] MEDICAL ONCOLOGY [phone]   03/29/2013 9:30 AM Gordy Savers, MD [facility] [phone]   04/15/2013 9:30 AM Huston Foley, MD [facility] [phone]       Medication List    ASK your doctor about these medications       abiraterone Acetate 250 MG tablet   Commonly known as:  ZYTIGA  Take 1,000 mg by mouth daily. Take on an empty stomach 1 hour before or 2 hours after a meal     ACCU-CHEK SOFTCLIX LANCETS lancets  1 each by Other route 2 (two) times daily as needed for other. Use as instructed     acetaminophen 500 MG tablet  Commonly known as:  TYLENOL  Take 1,000 mg by mouth every 6 (six) hours as needed for pain.     ALKA-SELTZER PO  Take 1 tablet by mouth as needed (indigestion).     amLODipine-valsartan 10-320 MG per tablet  Commonly known as:  EXFORGE  Take 1 tablet by mouth daily.     aspirin EC 81 MG tablet  Take 81 mg by mouth every morning.     atorvastatin 10 MG tablet  Commonly known as:  LIPITOR  Take 10 mg by mouth daily.     calcium carbonate-magnesium hydroxide 334 MG Chew  Commonly known as:  ROLAIDS  Chew 1 tablet by mouth 2 (two) times daily as needed (indigestion).     CITRACAL + D PO  Take 1 tablet by mouth daily.     docusate sodium 50 MG capsule  Commonly known as:  COLACE  Take 50-100 mg by mouth 2 (two) times daily.     donepezil 10 MG tablet  Commonly known as:  ARICEPT  Take 10 mg by mouth daily.     esomeprazole 20 MG capsule  Commonly known as:  NEXIUM  Take 20 mg by mouth daily as needed (indigestion).     glimepiride 2 MG tablet  Commonly known as:  AMARYL  Take 2 mg by mouth daily before breakfast.     glucose blood test strip  Commonly known as:  ACCU-CHEK SMARTVIEW  1 each by Other route 2 (two) times daily as needed for other. Use as instructed     JUICE PLUS FIBRE PO  Take 6 capsules by mouth daily.     leuprolide 3.75 MG injection  Commonly known as:  LUPRON  Inject 3.75 mg into the muscle every 4 (four) months.     levothyroxine 50 MCG tablet  Commonly known as:  SYNTHROID, LEVOTHROID  Take 50 mcg by mouth every morning.     metFORMIN 1000 MG tablet  Commonly known as:  GLUCOPHAGE  Take 500-1,000 mg by mouth 2 (two) times daily with a meal. Take 1,000mg  every  morning and take 500mg  every evening.     nitroGLYCERIN 0.4 MG SL tablet  Commonly known as:  NITROSTAT  Place 0.4 mg under the tongue every 5 (five) minutes as needed. For chest pain     omega-3 acid ethyl esters 1 G capsule  Commonly known as:  LOVAZA  Take 1 g by mouth 2 (two) times daily.     oxybutynin 10 MG 24 hr tablet  Commonly known as:  DITROPAN-XL  Take 10 mg by mouth daily.     pregabalin 50 MG capsule  Commonly known as:  LYRICA  Take 50-100 mg by mouth See admin instructions. 100mg  in the morning and 50mg  in the evening     sitaGLIPtin 50 MG tablet  Commonly known as:  JANUVIA  Take 50 mg  by mouth 2 (two) times daily.     XGEVA [place]  Inject into the skin every 30 (thirty) days. Injects "around the first part of each month."         The results of significant diagnostics from this hospitalization (including imaging, microbiology, ancillary and laboratory) are listed below for reference.    Significant Diagnostic Studies: Dg Chest 2 View  02/21/2013   *RADIOLOGY REPORT*  Clinical Data: Chest pain  CHEST - 2 VIEW  Comparison: 08/30/2011  Findings: The cardiac silhouette is normal in size and configuration.  The aorta is mildly uncoiled.  There are changes from previous CABG surgery, stable.  No mediastinal or hilar masses are noted.  Small stable nodule right mid lung likely a granuloma.  The lungs are otherwise clear.  There is no pleural effusion or pneumothorax.  The bony thorax is intact.  IMPRESSION: No acute cardiopulmonary disease.   Original Report Authenticated By: Amie Portland, M.D.     Microbiology: No results found for this or any previous visit (from the past 240 hour(s)).   Labs: Basic Metabolic Panel:  Recent Labs Lab 02/19/13 0919 02/21/13 1658 02/21/13 2047 02/22/13 0254  NA 142 137  --  139  K 3.9 3.8  --  3.8  CL  --  103  --  107  CO2 22 23  --  24  GLUCOSE 214* 163*  --  155*  BUN 17.2 26*  --  27*  CREATININE 1.5* 1.46* 1.40* 1.40*   CALCIUM 10.3 9.3  --  9.0   Liver Function Tests:  Recent Labs Lab 02/19/13 0919 02/21/13 2117  AST 17 18  ALT 18 18  ALKPHOS 74 63  BILITOT 1.03 0.5  PROT 7.1 6.3  ALBUMIN 3.7 3.4*    Recent Labs Lab 02/21/13 2117  LIPASE 137*   No results found for this basename: AMMONIA,  in the last 168 hours CBC:  Recent Labs Lab 02/19/13 0919 02/21/13 1658 02/21/13 2047 02/22/13 0254  WBC 3.5* 3.4* 3.7* 3.7*  NEUTROABS 2.1  --   --   --   HGB 12.2* 10.6* 11.0* 10.1*  HCT 35.5* 29.8* 31.2* 28.8*  MCV 89.4 89.5 89.4 89.2  PLT 133* 128* 129* 125*   Cardiac Enzymes:  Recent Labs Lab 02/21/13 2047 02/22/13 0254 02/22/13 0836  TROPONINI <0.30 <0.30 <0.30   BNP: No components found with this basename: POCBNP,  CBG:  Recent Labs Lab 02/21/13 2050 02/22/13 0837  GLUCAP 205* 128*    Time coordinating discharge:  Greater than 30 minutes  Signed:  Shuntia Exton, DO [facility] Pager: [phone] 02/22/2013, 10:56 AM

## 2013-02-23 ENCOUNTER — Telehealth: Payer: Self-pay | Admitting: *Deleted

## 2013-02-23 NOTE — ED Provider Notes (Signed)
Medical screening examination/treatment/procedure(s) were performed by non-physician practitioner and as supervising physician I was immediately available for consultation/collaboration.  Gael Delude T Loveda Colaizzi, MD 02/23/13 1125 

## 2013-02-23 NOTE — Telephone Encounter (Signed)
Called patient's latest PSA result, per dr Alver Fisher request.

## 2013-02-26 ENCOUNTER — Encounter: Payer: Self-pay | Admitting: Internal Medicine

## 2013-02-26 ENCOUNTER — Ambulatory Visit (INDEPENDENT_AMBULATORY_CARE_PROVIDER_SITE_OTHER): Payer: Medicare Other | Admitting: Internal Medicine

## 2013-02-26 VITALS — BP 120/68 | HR 50 | Temp 97.9°F | Resp 20 | Wt 190.0 lb

## 2013-02-26 DIAGNOSIS — I10 Essential (primary) hypertension: Secondary | ICD-10-CM

## 2013-02-26 DIAGNOSIS — E119 Type 2 diabetes mellitus without complications: Secondary | ICD-10-CM

## 2013-02-26 DIAGNOSIS — I259 Chronic ischemic heart disease, unspecified: Secondary | ICD-10-CM

## 2013-02-26 NOTE — Progress Notes (Signed)
Subjective:    Patient ID: [name] [name], male    DOB: 08/08/1929, 79 y.o.   MRN: [id number]  HPI  9 -year-old patient seen following a recent hospital discharge 4 days ago. He was admitted for evaluation of chest pain and is scheduled for a nuclear stress test early next month. He has had no recurrent chest pain  He has diabetes mellitus and metformin therapy has been held. Creatinine is in the range of 1.4. He remains on glimepiride 2 mg daily as well as Januvia. Blood sugars have trended up slightly since his hospital discharge.  Hemoglobin A1c 6.0  2 months ago and Amaryl  was decreased to 2 mg daily He has hypertension which has been well-controlled. He is followed closely by oncology do to metastatic prostate cancer and is on anti-antigen therapy. Recent PSA much improved He is being followed by orthopedics due to  right knee and right shoulder pain. Presently he is using a walker  Admit date: 02/21/2013  Discharge date: 02/22/2013  Recommendations for Outpatient Follow-up:  1. Pt will need to follow up with PCP in 2 weeks post discharge 2. Please obtain BMP to evaluate electrolytes and kidney function 3. Please also check CBC to evaluate Hg and Hct levels Discharge Diagnoses:  Principal Problem:  Chest pain  Active Problems:  DIABETES MELLITUS, TYPE II  CORONARY ARTERY DISEASE  PROSTATE CANCER, HX OF  angina  -Appears stable, relieved with nitroglycerin SL  -Consulted [facility]  -pt seen by Dr. Gala Romney who did not feel pt needed any additional testing  -09/01/2011 Myoview negative, EF 63%  -History of CAD with stent in 2009  -Continue aspirin  -Continue statin  Diabetes mellitus type 2  -continue trajenta and amaryl  -ISS while in hospital  -Patient was told to discontinue metformin due to his renal insufficiency. His estimated creatinine clearance is 45.  -Patient was instructed to follow up with his primary care provider for adjustment of his diabetes regimen.   Hypertension  -Continue amlodipine and Avapro  Hypothyroidism  -Continue Synthroid  Prostate cancer  -continue antiandrogen tx--abiraterone  -follow up Dr. Clelia Croft   Past Medical History  Diagnosis Date  . Hypertension   . Hyperlipidemia   . Memory loss     mild  . Rectal ulcer     RESOLVED  . CAD (coronary artery disease)     S/P CABG x 5 1998; S/P stent LCX 2009  . Constipation   . History of ventricular fibrillation 1998    while on treadmill  . Hypothyroidism   . IBS (irritable bowel syndrome)   . Colitis     RESOLVED  . ED (erectile dysfunction)   . Osteoporosis   . Depression   . Diabetes mellitus     fasting blood sugar 140-200 usually  . GERD (gastroesophageal reflux disease)   . Heart attack 2009  . Dementia   . Neuromuscular disorder     DIABETIC NEUROPATHY  . Prostate cancer   . Pain     LOWER BACK  -- PAST HX OF STEROID EPIDURALS FOR PAIN RELIEF  . 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 Marital Status: Widowed    Spouse Name: N/A    Number of Children: N/A  . Years of Education: N/A   Occupational History  . Not on file.  Social History Main Topics  . Smoking status: Former Games developer  . Smokeless tobacco: Former Neurosurgeon    Quit date: 02/11/1948  . Alcohol Use: No  . Drug Use: No  . Sexual Activity: Not on file   Other Topics Concern  . Not on file   Social History Narrative  . No narrative on file    Past Surgical History  Procedure Laterality Date  . Coronary artery bypass graft  1998    x5, with a left internal mammary to the LAD and diagonal, vein graft to the OM1 and 2, and a vein graft to the right coronary artery  . Coronary stent placement  11/13/2007    LCX  . Trigger finger repaired  2009  . Laparoscopic gastrectomy N/A 08/18/2012    Procedure: Laparoscopic  Partial Gastrectomy;  Surgeon: Ardeth Sportsman, MD;  Location: [facility];  Service: General;  Laterality: N/A;  Laparoscopic Partial Gastrectomy,    Family History  Problem Relation Age of Onset  . Stroke Father   . Pneumonia Father   . Colon cancer Neg Hx     Allergies  Allergen Reactions  . Codeine Phosphate Other (See Comments)    mouth sores    Current Outpatient Prescriptions on File Prior to Visit  Medication Sig Dispense Refill  . abiraterone Acetate (ZYTIGA) 250 MG tablet Take 1,000 mg by mouth daily. Take on an empty stomach 1 hour before or 2 hours after a meal      . ACCU-CHEK SOFTCLIX LANCETS lancets 1 each by Other route 2 (two) times daily as needed for other. Use as instructed  100 each  12  . acetaminophen (TYLENOL) 500 MG tablet Take 1,000 mg by mouth every 6 (six) hours as needed for pain.      [name] amLODipine-valsartan (EXFORGE) 10-320 MG per tablet Take 1 tablet by mouth daily.      [name] aspirin EC 81 MG tablet Take 81 mg by mouth every morning.       . Aspirin Effervescent (ALKA-SELTZER PO) Take 1 tablet by mouth as needed (indigestion).      [name] atorvastatin (LIPITOR) 10 MG tablet Take 10 mg by mouth daily.      . calcium carbonate-magnesium hydroxide (ROLAIDS) 334 MG CHEW Chew 1 tablet by mouth 2 (two) times daily as needed (indigestion).       . Calcium Citrate-Vitamin D (CITRACAL + D PO) Take 1 tablet by mouth daily.      . Denosumab (XGEVA [place]) Inject into the skin every 30 (thirty) days. Injects "around the first part of each month."      . docusate sodium (COLACE) 50 MG capsule Take 50-100 mg by mouth 2 (two) times daily.       [name] donepezil (ARICEPT) 10 MG tablet Take 10 mg by mouth daily.      [name] esomeprazole (NEXIUM) 20 MG capsule Take 20 mg by mouth daily as needed (indigestion).      [name] glimepiride (AMARYL) 2 MG tablet Take 2 mg by mouth daily before breakfast.      . glucose blood (ACCU-CHEK SMARTVIEW) test strip 1 each by Other route 2 (two) times daily as needed  for other. Use as instructed  100 each  12  . leuprolide (LUPRON) 3.75 MG injection Inject 3.75 mg into the muscle every 4 (four) months.      . levothyroxine (SYNTHROID, LEVOTHROID) 50 MCG tablet Take 50 mcg by mouth every morning.       . nitroGLYCERIN (  NITROSTAT) 0.4 MG SL tablet Place 0.4 mg under the tongue every 5 (five) minutes as needed. For chest pain      . Nutritional Supplements (JUICE PLUS FIBRE PO) Take 6 capsules by mouth daily.       [name] omega-3 acid ethyl esters (LOVAZA) 1 G capsule Take 1 g by mouth 2 (two) times daily.      [name] oxybutynin (DITROPAN-XL) 10 MG 24 hr tablet Take 10 mg by mouth daily.       . pregabalin (LYRICA) 50 MG capsule Take 50-100 mg by mouth See admin instructions. 100mg  in the morning and 50mg  in the evening      . sitaGLIPtin (JANUVIA) 50 MG tablet Take 50 mg by mouth 2 (two) times daily.      . [DISCONTINUED] chlorthalidone (HYGROTON) 25 MG tablet Take 12.5 mg by mouth daily.       No current facility-administered medications on file prior to visit.    BP 120/68  Pulse 50  Temp(Src) 97.9 F (36.6 C) (Oral)  Resp 20  Wt 190 lb (86.183 kg)  BMI 29.75 kg/m2  SpO2 98%     Review of Systems  Constitutional: Negative for fever, chills, appetite change and fatigue.  HENT: Negative for hearing loss, ear pain, congestion, sore throat, trouble swallowing, neck stiffness, dental problem, voice change and tinnitus.   Eyes: Negative for pain, discharge and visual disturbance.  Respiratory: Negative for cough, chest tightness, wheezing and stridor.   Cardiovascular: Negative for chest pain, palpitations and leg swelling.  Gastrointestinal: Negative for nausea, vomiting, abdominal pain, diarrhea, constipation, blood in stool and abdominal distention.  Genitourinary: Negative for urgency, hematuria, flank pain, discharge, difficulty urinating and genital sores.  Musculoskeletal: Negative for myalgias, back pain, joint swelling, arthralgias and gait problem.   Skin: Negative for rash.  Neurological: Negative for dizziness, syncope, speech difficulty, weakness, numbness and headaches.  Hematological: Negative for adenopathy. Does not bruise/bleed easily.  Psychiatric/Behavioral: Negative for behavioral problems and dysphoric mood. The patient is not nervous/anxious.        Objective:   Physical Exam  Constitutional: He is oriented to person, place, and time. He appears well-developed.  HENT:  Head: Normocephalic.  Right Ear: External ear normal.  Left Ear: External ear normal.  Eyes: Conjunctivae and EOM are normal.  Neck: Normal range of motion.  Cardiovascular: Normal rate and normal heart sounds.   Pulmonary/Chest: Breath sounds normal.  Abdominal: Bowel sounds are normal.  Musculoskeletal: Normal range of motion. He exhibits no edema and no tenderness.  Neurological: He is alert and oriented to person, place, and time.  Psychiatric: He has a normal mood and affect. His behavior is normal.          Assessment & Plan:   Status post recent hospital admission for chest pain. Followup stress test next month as scheduled Hospital records reviewed  Diabetes mellitus. Metformin therapy presently on hold. Creatinine is at 1.4. We'll continue to hold at the present time and check a hemoglobin A1c at next visit. We'll consider adding a submaximal dose of metformin in the future if necessary and creatinine remains less than 1.5  Hypertension stable  Metastatic prostate cancer  Anemia. Multifactorial   Recheck next next month as scheduled

## 2013-03-04 ENCOUNTER — Ambulatory Visit (HOSPITAL_COMMUNITY): Payer: Medicare Other | Attending: Cardiovascular Disease | Admitting: Radiology

## 2013-03-04 DIAGNOSIS — R0789 Other chest pain: Secondary | ICD-10-CM

## 2013-03-04 DIAGNOSIS — R0989 Other specified symptoms and signs involving the circulatory and respiratory systems: Secondary | ICD-10-CM

## 2013-03-04 MED ORDER — TECHNETIUM TC 99M SESTAMIBI GENERIC - CARDIOLITE
33.0000 | Freq: Once | INTRAVENOUS | Status: AC | PRN
  Administered 2013-03-04: 33 via INTRAVENOUS

## 2013-03-05 ENCOUNTER — Telehealth: Payer: Self-pay | Admitting: Oncology

## 2013-03-05 NOTE — Telephone Encounter (Signed)
Moved 9/19 appt to 9/22. Not able to reach pt on home phone. lmonvm on cell and mailed schedule.

## 2013-03-08 ENCOUNTER — Telehealth: Payer: Self-pay | Admitting: Internal Medicine

## 2013-03-08 ENCOUNTER — Ambulatory Visit (INDEPENDENT_AMBULATORY_CARE_PROVIDER_SITE_OTHER): Payer: Medicare Other | Admitting: Physician Assistant

## 2013-03-08 ENCOUNTER — Ambulatory Visit (HOSPITAL_COMMUNITY): Payer: Medicare Other | Attending: Internal Medicine | Admitting: Radiology

## 2013-03-08 ENCOUNTER — Encounter: Payer: Self-pay | Admitting: Physician Assistant

## 2013-03-08 VITALS — BP 157/76 | HR 39 | Ht 67.5 in | Wt 178.0 lb

## 2013-03-08 VITALS — BP 140/80 | HR 50 | Ht 67.5 in | Wt 184.0 lb

## 2013-03-08 DIAGNOSIS — I10 Essential (primary) hypertension: Secondary | ICD-10-CM

## 2013-03-08 DIAGNOSIS — E119 Type 2 diabetes mellitus without complications: Secondary | ICD-10-CM | POA: Insufficient documentation

## 2013-03-08 DIAGNOSIS — I4510 Unspecified right bundle-branch block: Secondary | ICD-10-CM

## 2013-03-08 DIAGNOSIS — E785 Hyperlipidemia, unspecified: Secondary | ICD-10-CM | POA: Insufficient documentation

## 2013-03-08 DIAGNOSIS — R079 Chest pain, unspecified: Secondary | ICD-10-CM

## 2013-03-08 DIAGNOSIS — Z87891 Personal history of nicotine dependence: Secondary | ICD-10-CM | POA: Insufficient documentation

## 2013-03-08 DIAGNOSIS — R0789 Other chest pain: Secondary | ICD-10-CM | POA: Insufficient documentation

## 2013-03-08 DIAGNOSIS — I2510 Atherosclerotic heart disease of native coronary artery without angina pectoris: Secondary | ICD-10-CM

## 2013-03-08 DIAGNOSIS — Z951 Presence of aortocoronary bypass graft: Secondary | ICD-10-CM | POA: Insufficient documentation

## 2013-03-08 MED ORDER — TECHNETIUM TC 99M SESTAMIBI GENERIC - CARDIOLITE
33.0000 | Freq: Once | INTRAVENOUS | Status: AC | PRN
  Administered 2013-03-08: 33 via INTRAVENOUS

## 2013-03-08 MED ORDER — REGADENOSON 0.4 MG/5ML IV SOLN
0.4000 mg | Freq: Once | INTRAVENOUS | Status: AC
  Administered 2013-03-08: 0.4 mg via INTRAVENOUS

## 2013-03-08 NOTE — Telephone Encounter (Signed)
Pt is to inform md of blood sugars on new meds. 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

## 2013-03-08 NOTE — Telephone Encounter (Signed)
Suggest return office visit when he returns to town

## 2013-03-08 NOTE — Patient Instructions (Addendum)
Your physician recommends that you continue on your current medications as directed. Please refer to the Current Medication list given to you today.  Your physician recommends that you schedule a follow-up appointment in: 4 MONTHS WITH DR.HOCHREIN

## 2013-03-08 NOTE — Progress Notes (Signed)
[address] [place], [place]  [address] Phone: [phone] Fax:  [phone]  Date:  03/08/2013   ID:  [name] [name], DOB Jun 08, 1930, MRN [id number]  PCP:  Rogelia Boga, MD  Cardiologist:  Dr. Rollene Rotunda     History of Present Illness: [name] [name] is a 79 y.o. male who returns for follow up after a recent admission to the hospital with chest pain.    He has a hx of CAD, s/p CABG. LHC 5/09: RCA proximal 90%, 95%, distally occluded, pCFX stents patent, OM1 30%, small left atrial branch 95%, oLAD occluded, S-dRCA patent, L-LAD/Dx patent, EF 60%. Medical therapy was continued at that time. Previously followed by Dr. Deborah Chalk. He established with Dr. Antoine Poche in 04/2011. Other hx includes DM2, HTN, HL, hypothyroidism, castration resistant prostate cancer and GIST (gastrointestinal stromal tumor). He was evaluated in 3/13 in the hospital for chest pain. Lexiscan Myoview at that time demonstrated an EF of 63% and no scar or ischemia.   Last seen here in 03/2012 for surgical clearance.  He subsequently underwent laparoscopic resection of his GIST with Dr. Michaell Cowing in 08/2012.   He was admitted 8/24-8/25 after presenting to the ED with CP.  Seen by Dr. Arvilla Meres for cardiology.  CEs remained neg.  Symptoms felt to be atypical and OP f/u recommended with OP Myoview.  Lexiscan Myoview 03/08/13:  EF 56%, inf and apical defect suggestive of thinning and soft tissue attenuation, no ischemia; low risk.  Since d/c, he denies further CP.  No significant dyspnea.  No orthopnea, PND, edema.  No syncope.    Labs (3/13): K 3.3, creatinine 1.39, ALT 14, Hgb 10.6  Labs (8/13): Creatinine 1.38 Labs (8/14): K 3.8, Cr 1.40, ALT 18, Hgb 10.1, PLT 125K  Wt Readings from Last 3 Encounters:  03/08/13 178 lb (80.74 kg)  02/26/13 190 lb (86.183 kg)  02/21/13 186 lb (84.369 kg)     Past Medical History  Diagnosis Date  . Hypertension   . Hyperlipidemia   . Memory loss    mild  . Rectal ulcer     RESOLVED  . Constipation   . History of ventricular fibrillation 1998    while on treadmill  . Hypothyroidism   . IBS (irritable bowel syndrome)   . Colitis     RESOLVED  . ED (erectile dysfunction)   . Osteoporosis   . Depression   . Diabetes mellitus     fasting blood sugar 140-200 usually  . GERD (gastroesophageal reflux disease)   . Heart attack 2009  . Dementia   . Neuromuscular disorder     DIABETIC NEUROPATHY  . Prostate cancer   . Pain     LOWER BACK  -- PAST HX OF STEROID EPIDURALS FOR PAIN RELIEF  . 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 CAD (coronary artery disease)     a. S/P CABG x 5 1998; b.  S/P stent LCX 2009;  c.  LHC 5/09: RCA proximal 90%, 95%, distally occluded, pCFX stents patent, OM1 30%, small left atrial branch 95%, oLAD occluded, S-dRCA patent, L-LAD/Dx patent, EF 60%=> Med Rx  ;  d.  Lex MV 3/13:  EF 63%, no ischemia  e.  Lex Myoview 03/08/13:  EF 56%, inf and apical defect suggestive of thinning and soft tissue attenuation, no ischemia; low risk.  Current Outpatient Prescriptions  Medication Sig Dispense Refill  . abiraterone Acetate (ZYTIGA) 250 MG tablet Take 1,000 mg by mouth daily. Take on an empty stomach 1 hour before or 2 hours after a meal      . ACCU-CHEK SOFTCLIX LANCETS lancets 1 each by Other route 2 (two) times daily as needed for other. Use as instructed  100 each  12  . acetaminophen (TYLENOL) 500 MG tablet Take 1,000 mg by mouth every 6 (six) hours as needed for pain.      [name] amLODipine-valsartan (EXFORGE) 10-320 MG per tablet Take 1 tablet by mouth daily.      [name] aspirin EC 81 MG tablet Take 81 mg by mouth every morning.       . Aspirin Effervescent (ALKA-SELTZER PO) Take 1 tablet by mouth as needed (indigestion).      [name] atorvastatin (LIPITOR) 10 MG tablet Take 10 mg by  mouth daily.      . calcium carbonate-magnesium hydroxide (ROLAIDS) 334 MG CHEW Chew 1 tablet by mouth 2 (two) times daily as needed (indigestion).       . Calcium Citrate-Vitamin D (CITRACAL + D PO) Take 1 tablet by mouth daily.      . Denosumab (XGEVA [place]) Inject into the skin every 30 (thirty) days. Injects "around the first part of each month."      . docusate sodium (COLACE) 50 MG capsule Take 50-100 mg by mouth 2 (two) times daily.       [name] donepezil (ARICEPT) 10 MG tablet Take 10 mg by mouth daily.      [name] esomeprazole (NEXIUM) 20 MG capsule Take 20 mg by mouth daily as needed (indigestion).      [name] glimepiride (AMARYL) 2 MG tablet Take 2 mg by mouth daily before breakfast.      . glucose blood (ACCU-CHEK SMARTVIEW) test strip 1 each by Other route 2 (two) times daily as needed for other. Use as instructed  100 each  12  . leuprolide (LUPRON) 3.75 MG injection Inject 3.75 mg into the muscle every 4 (four) months.      . levothyroxine (SYNTHROID, LEVOTHROID) 50 MCG tablet Take 50 mcg by mouth every morning.       . nitroGLYCERIN (NITROSTAT) 0.4 MG SL tablet Place 0.4 mg under the tongue every 5 (five) minutes as needed. For chest pain      . Nutritional Supplements (JUICE PLUS FIBRE PO) Take 6 capsules by mouth daily.       [name] omega-3 acid ethyl esters (LOVAZA) 1 G capsule Take 1 g by mouth 2 (two) times daily.      [name] oxybutynin (DITROPAN-XL) 10 MG 24 hr tablet Take 10 mg by mouth daily.       . pregabalin (LYRICA) 50 MG capsule Take 50-100 mg by mouth See admin instructions. 100mg  in the morning and 50mg  in the evening      . sitaGLIPtin (JANUVIA) 50 MG tablet Take 50 mg by mouth 2 (two) times daily.      . [DISCONTINUED] chlorthalidone (HYGROTON) 25 MG tablet Take 12.5 mg by mouth daily.       No current facility-administered medications for this visit.    Allergies:    Allergies  Allergen Reactions  . Codeine Phosphate Other (See Comments)    mouth sores    Social History:  The  patient  reports that he has quit smoking. He quit smokeless tobacco use about 65 years ago. He reports that he does not  drink alcohol or use illicit drugs.   ROS:  Please see the history of present illness.   He notes R knee pain and swelling.  He is seeing ortho for this.   All other systems reviewed and negative.   PHYSICAL EXAM: VS:  BP 140/80  Pulse 50  Ht 5' 7.5" (1.715 m)  Wt 184 lb (83.462 kg)  BMI 28.38 kg/m2 Well nourished, well developed, in no acute distress HEENT: normal Neck: no JVD at 90 degrees Cardiac:  normal S1, S2; RRR; no murmur Lungs:  clear to auscultation bilaterally, no wheezing, rhonchi or rales Abd: soft, nontender, no hepatomegaly Ext: no edema Skin: warm and dry Neuro:  CNs 2-12 intact, no focal abnormalities noted  EKG:  Sinus brady, HR 54, RBBB, no change     ASSESSMENT AND PLAN:  1. CAD:  No further CP.  Recent myoview low risk.  Continue med Rx with ASA, statin. 2. Hypertension:  Controlled.  Continue current therapy.  3. Hyperlipidemia:  Continue statin. 4. Prostate CA:  Follow up with urology and oncology. 5. GIST:  F/u with surgery as directed. 6. CKD:  Creatinine stable by recent measurements. 7. Disposition:  Follow up with Dr. Rollene Rotunda in 4 mos.    Signed, Tereso Newcomer, PA-C  03/08/2013 2:03 PM

## 2013-03-08 NOTE — Progress Notes (Signed)
[facility] SITE 3 NUCLEAR MED [address] [place], [place] [address] [phone]    Cardiology Nuclear Med Study  [name] is a 79 y.o. male     MRN : [id number]     DOB: [birth year]  Procedure Date: 03/08/2013  Nuclear Med Background Indication for Stress Test:  Evaluation for Ischemia , Stent/Graft Patency and Post Hospital on 02/21/13 with Chest Tightness, Negative Enzymens History:'98 CABG; 2/09 Stent-CFX; 5/09 Cath:patent stent and grafts; '13 GMW:NUUVOZ, EF=63%; 3/13 Echo:no report; 02/21/13 ED with CP, negative enzymes. Cardiac Risk Factors: History of Smoking, Hypertension, Lipids and NIDDM  Symptoms: Chest Tightness (none since discharge)   Nuclear Pre-Procedure Caffeine/Decaff Intake:  None > 12 hrs NPO After: 6:00pm   Lungs:  clear O2 Sat: on room air. IV 0.9% NS with Angio Cath:  22g  IV Site: L Antecubital  IV Started by:  Milana Na, EMT-P  Chest Size (in):  42 Cup Size: n/a  Height: 5' 7.5" (1.715 m)  Weight:  178 lb (80.74 kg)  BMI:  Body mass index is 27.45 kg/(m^2). Tech Comments: no medications today. Fasting CBG was 240 at 0800 today    Nuclear Med Study 1 or 2 day study: 2 day  Stress Test Type:  Eugenie Birks  Reading MD: Olga Millers, MD  Order Authorizing Provider:  Eleonore Chiquito, MD, and Rollene Rotunda, MD  Resting Radionuclide: Technetium 11m Sestamibi  Resting Radionuclide Dose: 11.0 mCi   Stress Radionuclide:  Technetium 64m Sestamibi  Stress Radionuclide Dose: 33.0 mCi           Stress Protocol Rest HR: 39 Stress HR: 85  Rest BP: 157/76 Stress BP:142/76  Exercise Time (min): n/a METS: n/a   Predicted Max HR: 140 bpm % Max HR: 60.71 bpm Rate Pressure Product: 36644   Dose of Adenosine (mg):  n/a Dose of Lexiscan: 0.4 mg  Dose of Atropine (mg): n/a Dose of Dobutamine: n/a mcg/kg/min (at max HR)  Stress Test Technologist: Smiley Houseman, CMA-N  Nuclear Technologist:  Domenic Polite, CNMT     Rest Procedure:   Myocardial perfusion imaging was performed at rest 45 minutes following the intravenous administration of Technetium 59m Sestamibi.  Rest ECG: Marked sinus bradycardia; RBBB  Stress Procedure:  The patient received IV Lexiscan 0.4mg  over 15-seconds. Technetium 99 m Sestamibi injected at 30-seconds.   He c/o head and stomach ache with Lexiscan.  Quantitative spect images were obtained after a 45 minute delay.  Stress ECG: No significant ST segment change suggestive of ischemia.  QPS Raw Data Images:  Acquisition technically good; normal left ventricular size. Stress Images:  There is decreased uptake in the inferior wall and apex. Rest Images:  There is decreased uptake in the inferior wall and apex. Subtraction (SDS):  No evidence of ischemia. Transient Ischemic Dilatation (Normal <1.22):  n/a Lung/Heart Ratio (Normal <0.45):  0.37  Quantitative Gated Spect Images QGS EDV:  93 ml QGS ESV:  41 ml  Impression Exercise Capacity:  Lexiscan with no exercise. BP Response:  Normal blood pressure response. Clinical Symptoms:  No chest pain or dyspnea. ECG Impression:  No significant ST segment change suggestive of ischemia. Comparison with Prior Nuclear Study: Inferior defect new compared to 08/31/11/  Overall Impression:  Low risk stress nuclear study with a moderate size, medium intensity, fixed inferior and apical defect suggestive of thinning and soft tissue attenuation; no ischemia..  LV Ejection Fraction: 56%.  LV Wall Motion:  NL LV Function; NL Wall Motion  [name] [name]

## 2013-03-09 NOTE — Telephone Encounter (Signed)
Left message on voicemail to call office.  

## 2013-03-11 NOTE — Telephone Encounter (Signed)
Left message on voicemail to call office.  

## 2013-03-12 ENCOUNTER — Encounter: Payer: Self-pay | Admitting: Neurology

## 2013-03-15 NOTE — Telephone Encounter (Signed)
Left detailed message on personal voicemail to make an appt to see Dr.K when return from out of town.

## 2013-03-16 ENCOUNTER — Encounter: Payer: Self-pay | Admitting: Neurology

## 2013-03-19 ENCOUNTER — Ambulatory Visit: Payer: Medicare Other | Admitting: Oncology

## 2013-03-19 ENCOUNTER — Other Ambulatory Visit: Payer: Medicare Other | Admitting: Lab

## 2013-03-19 ENCOUNTER — Telehealth: Payer: Self-pay | Admitting: *Deleted

## 2013-03-19 ENCOUNTER — Ambulatory Visit (INDEPENDENT_AMBULATORY_CARE_PROVIDER_SITE_OTHER): Payer: Medicare Other | Admitting: Internal Medicine

## 2013-03-19 DIAGNOSIS — Z0289 Encounter for other administrative examinations: Secondary | ICD-10-CM

## 2013-03-19 DIAGNOSIS — I10 Essential (primary) hypertension: Secondary | ICD-10-CM

## 2013-03-19 NOTE — Telephone Encounter (Signed)
Pt was scheduled to been seen today but had to reschedule due to being late for appt. Pt concerned about blood sugars they are over 200 consistently. Discussed with Dr. Kirtland Bouchard he said for pt to increase Glimepiride to 4 mg daily and make sure taking 100 mg of Januvia. Spoke to pt and pt's daughter Cordelia Pen told them Dr. Kirtland Bouchard would like him to increase Glimepiride to 4 mg daily and asked if taking 100 mg of Januvia. Pt stated yes and also taking Metformin 500 mg in the evening. Cordelia Pen said she was told for him to go back on Metformin if sugars increase. Told her okay I will discuss with Dr.K on Monday meanwhile continue Januvia 100 mg , increase Glimepiride to 4 mg daily and continue Metformin 500 mg and I will get back to them on Monday. Pt and Cordelia Pen verbalized understanding.

## 2013-03-22 ENCOUNTER — Encounter: Payer: Self-pay | Admitting: Oncology

## 2013-03-22 ENCOUNTER — Telehealth: Payer: Self-pay | Admitting: Oncology

## 2013-03-22 ENCOUNTER — Ambulatory Visit (HOSPITAL_BASED_OUTPATIENT_CLINIC_OR_DEPARTMENT_OTHER): Payer: Medicare Other | Admitting: Oncology

## 2013-03-22 ENCOUNTER — Other Ambulatory Visit (HOSPITAL_BASED_OUTPATIENT_CLINIC_OR_DEPARTMENT_OTHER): Payer: Medicare Other | Admitting: Lab

## 2013-03-22 VITALS — BP 162/64 | HR 41 | Temp 97.9°F | Resp 19 | Ht 68.0 in | Wt 186.4 lb

## 2013-03-22 DIAGNOSIS — C61 Malignant neoplasm of prostate: Secondary | ICD-10-CM

## 2013-03-22 DIAGNOSIS — C7951 Secondary malignant neoplasm of bone: Secondary | ICD-10-CM

## 2013-03-22 DIAGNOSIS — Z8546 Personal history of malignant neoplasm of prostate: Secondary | ICD-10-CM

## 2013-03-22 LAB — COMPREHENSIVE METABOLIC PANEL (CC13)
ALT: 15 U/L (ref 0–55)
AST: 13 U/L (ref 5–34)
Alkaline Phosphatase: 68 U/L (ref 40–150)
BUN: 19 mg/dL (ref 7.0–26.0)
Creatinine: 1.4 mg/dL — ABNORMAL HIGH (ref 0.7–1.3)

## 2013-03-22 LAB — CBC WITH DIFFERENTIAL/PLATELET
BASO%: 0.6 % (ref 0.0–2.0)
Basophils Absolute: 0 10*3/uL (ref 0.0–0.1)
EOS%: 6.8 % (ref 0.0–7.0)
HCT: 33.1 % — ABNORMAL LOW (ref 38.4–49.9)
HGB: 11.6 g/dL — ABNORMAL LOW (ref 13.0–17.1)
MCH: 31.7 pg (ref 27.2–33.4)
MCHC: 34.9 g/dL (ref 32.0–36.0)
MCV: 90.8 fL (ref 79.3–98.0)
MONO%: 10.3 % (ref 0.0–14.0)
NEUT%: 58.7 % (ref 39.0–75.0)
RDW: 14.9 % — ABNORMAL HIGH (ref 11.0–14.6)
lymph#: 0.9 10*3/uL (ref 0.9–3.3)

## 2013-03-22 MED ORDER — ABIRATERONE ACETATE 250 MG PO TABS: 1000.0000 mg | ORAL_TABLET | Freq: Every day | ORAL | Status: DC

## 2013-03-22 NOTE — Telephone Encounter (Signed)
gve the pt his nov 2014 appt calendar along with the avs. °

## 2013-03-22 NOTE — Progress Notes (Signed)
Hematology and Oncology Follow Up Visit  [name] [name] [id number] [birth year] 79 y.o. 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 His initial diagnosis was in 2006 when he presented with a PSA of 6.26 the Gleason score 4+3 equals 7.  Prior Therapy:  He is status post definitive therapy with radiation and androgen deprivation completed in 2007. The patient developed recurrent disease in 2012 with a PSA rise up to 28 and T9 osseous metastasis and was treated with combined androgen deprivation with Lupron and Casodex. He had an excellent response with his PSA nadir down to 0.19. Most recently he developed a rise in his PSA up to 3.06 in July 2014 despite castrate levels of testosterone. His bone scan in July 2014 confirmed the presence of bony disease.  Current therapy:  Zytiga 1000 mg daily started in July 2014. He [place] monthly at [facility] urology. He receives Lupron every 4 months at [facility] urology.  Interim History:   The patient returns today for a followup visit. He remains on Zytiga without any complications. He is not reporting any new bone pain at this time. He does have ongoing right knee and right shoulder pain due to arthritis. This is unchanged and predates the start of his Zytiga. Denies fatigue. His appetite is good and weight is stable. No lower extremity edema. No abdominal pain, nausea, vomiting. His performance status is unchanged.  Medications: I have reviewed the patient's current medications.  Current Outpatient Prescriptions  Medication Sig Dispense Refill  . metFORMIN (GLUCOPHAGE) 500 MG tablet Take 500 mg by mouth 2 (two) times daily with a meal.      . abiraterone Acetate (ZYTIGA) 250 MG tablet Take 4 tablets (1,000 mg total) by mouth daily. Take on an empty stomach 1 hour before or 2 hours after a meal   120 tablet  1  . ACCU-CHEK SOFTCLIX LANCETS lancets 1 each by Other route 2 (two) times daily as needed for other. Use as instructed  100 each  12  . acetaminophen (TYLENOL) 500 MG tablet Take 1,000 mg by mouth every 6 (six) hours as needed for pain.      [name] amLODipine-valsartan (EXFORGE) 10-320 MG per tablet Take 1 tablet by mouth daily.      [name] aspirin EC 81 MG tablet Take 81 mg by mouth every morning.       [name] atorvastatin (LIPITOR) 10 MG tablet Take 10 mg by mouth daily.      . calcium carbonate-magnesium hydroxide (ROLAIDS) 334 MG CHEW Chew 1 tablet by mouth 2 (two) times daily as needed (indigestion).       . Calcium Citrate-Vitamin D (CITRACAL + D PO) Take 1 tablet by mouth daily.      . Denosumab (XGEVA [place]) Inject into the skin every 30 (thirty) days. Injects "around the first part of each month."      . docusate sodium (COLACE) 50 MG capsule Take 50-100 mg by mouth 2 (two) times daily.       [name] donepezil (ARICEPT) 10 MG tablet Take 10 mg by mouth daily.      [name] esomeprazole (NEXIUM) 20 MG capsule Take 20 mg by mouth daily as needed (indigestion).      [name] glimepiride (AMARYL) 2 MG tablet Take 4 mg by mouth daily before breakfast.       . glucose blood (ACCU-CHEK SMARTVIEW) test strip 1  each by Other route 2 (two) times daily as needed for other. Use as instructed  100 each  12  . leuprolide (LUPRON) 3.75 MG injection Inject 3.75 mg into the muscle every 4 (four) months.      . levothyroxine (SYNTHROID, LEVOTHROID) 50 MCG tablet Take 50 mcg by mouth every morning.       . nitroGLYCERIN (NITROSTAT) 0.4 MG SL tablet Place 0.4 mg under the tongue every 5 (five) minutes as needed. For chest pain      . Nutritional Supplements (JUICE PLUS FIBRE PO) Take 6 capsules by mouth daily.       [name] omega-3 acid ethyl esters (LOVAZA) 1 G capsule Take 1 g by mouth 2 (two) times daily.      [name] oxybutynin (DITROPAN-XL) 10 MG 24 hr tablet Take 10 mg by mouth daily.       . pregabalin (LYRICA) 50 MG capsule Take  50-100 mg by mouth See admin instructions. 100mg  in the morning and 50mg  in the evening      . sitaGLIPtin (JANUVIA) 50 MG tablet Take 50 mg by mouth 2 (two) times daily.      [name] triamcinolone ointment (KENALOG) 0.1 %       . [DISCONTINUED] chlorthalidone (HYGROTON) 25 MG tablet Take 12.5 mg by mouth daily.       No current facility-administered medications for this visit.     Allergies:  Allergies  Allergen Reactions  . Codeine Phosphate Other (See Comments)    mouth sores    Past Medical History, Surgical history, Social history, and Family History were reviewed and updated.  Review of Systems: Constitutional:  Negative for fever, chills, night sweats, anorexia, weight loss, pain. Cardiovascular: no chest pain or dyspnea on exertion Respiratory: no cough, shortness of breath, or wheezing Neurological: no TIA or stroke symptoms Dermatological: negative ENT: negative Skin: Negative. Gastrointestinal: no abdominal pain, change in bowel habits, or black or bloody stools Genito-Urinary: no dysuria, trouble voiding, or hematuria Hematological and Lymphatic: negative Breast: negative for breast lumps Musculoskeletal: negative Remaining ROS negative.  Physical Exam: Blood pressure 162/64, pulse 41, temperature 97.9 F (36.6 C), temperature source Oral, resp. rate 19, height 5\' 8"  (1.727 m), weight 186 lb 6.4 oz (84.55 kg). ECOG: 1 General appearance: alert, cooperative and no distress Head: Normocephalic, without obvious abnormality, atraumatic Neck: no adenopathy, no carotid bruit, no JVD, supple, symmetrical, trachea midline and thyroid not enlarged, symmetric, no tenderness/mass/nodules Lymph nodes: Cervical, supraclavicular, and axillary nodes normal. Heart:regular rate and rhythm, S1, S2 normal, no murmur, click, rub or gallop Lung:chest clear, no wheezing, rales, normal symmetric air entry, no tachypnea, retractions or cyanosis Abdomen: soft, non-tender, without masses or  organomegaly EXT:no erythema, induration, or nodules   Lab Results: Lab Results  Component Value Date   WBC 4.0 03/22/2013   HGB 11.6* 03/22/2013   HCT 33.1* 03/22/2013   MCV 90.8 03/22/2013   PLT 119* 03/22/2013     Chemistry      Component Value Date/Time   NA 141 03/22/2013 1236   NA 139 02/22/2013 0254   K 4.3 03/22/2013 1236   K 3.8 02/22/2013 0254   CL 107 02/22/2013 0254   CO2 26 03/22/2013 1236   CO2 24 02/22/2013 0254   BUN 19.0 03/22/2013 1236   BUN 27* 02/22/2013 0254   CREATININE 1.4* 03/22/2013 1236   CREATININE 1.40* 02/22/2013 0254   CREATININE 1.38* 02/11/2012 1614      Component Value Date/Time   CALCIUM 9.2  03/22/2013 1236   CALCIUM 9.0 02/22/2013 0254   ALKPHOS 68 03/22/2013 1236   ALKPHOS 63 02/21/2013 2117   AST 13 03/22/2013 1236   AST 18 02/21/2013 2117   ALT 15 03/22/2013 1236   ALT 18 02/21/2013 2117   BILITOT 1.01 03/22/2013 1236   BILITOT 0.5 02/21/2013 2117     Results for JOSUA, FERREBEE (MRN [id number]) as of 03/22/2013 14:48  Ref. Range 06/06/2009 16:05 01/18/2011 17:03 01/13/2013 11:26 02/19/2013 09:19  PSA Latest Range: <=4.00 ng/mL 1.27 16.20 (H) 3.07 2.63   Impression and Plan: This is an 79 year old gentleman with the following issues: 1. 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 His initial diagnosis dates back to 2006 with PSA of 6.26 and a Gleason score of 7. He is currently on Zytiga since July 2014 is tolerating it well. PSA is down to 2.63 and is pending today. The plan is to continue on Zytiga. We will monitor his PSA, electrolytes, and liver function monthly.  2. Bony disease: He is receiving Xgeva at [facility] urology on a monthly basis.  3. Hormonal deprivation: He is receiving Lupron at [facility] urology every 4 months.  4. Followup: He will return in 4-5 weeks.   Spent more than half the time coordinating care.    Clenton Pare 9/22/20142:42 PM

## 2013-03-22 NOTE — Telephone Encounter (Signed)
Dr. Kirtland Bouchard please see message below and advise whether pt should increase Metformin?

## 2013-03-23 ENCOUNTER — Telehealth: Payer: Self-pay | Admitting: *Deleted

## 2013-03-23 NOTE — Telephone Encounter (Signed)
Message copied by Reesa Chew on Tue Mar 23, 2013  1:53 PM ------      Message from: Benjiman Core      Created: Tue Mar 23, 2013  8:47 AM       Please call his PSA. ------

## 2013-03-23 NOTE — Telephone Encounter (Signed)
Spoke with patient, gave results of last PSA 

## 2013-03-23 NOTE — Telephone Encounter (Signed)
Message copied by Reesa Chew on Tue Mar 23, 2013 12:39 PM ------      Message from: Benjiman Core      Created: Tue Mar 23, 2013  8:47 AM       Please call his PSA. ------

## 2013-03-25 ENCOUNTER — Encounter: Payer: Self-pay | Admitting: Neurology

## 2013-03-29 ENCOUNTER — Encounter: Payer: Self-pay | Admitting: Internal Medicine

## 2013-03-29 ENCOUNTER — Ambulatory Visit (INDEPENDENT_AMBULATORY_CARE_PROVIDER_SITE_OTHER): Payer: Medicare Other | Admitting: Internal Medicine

## 2013-03-29 VITALS — BP 122/70 | HR 54 | Temp 98.1°F | Resp 20 | Ht 67.5 in | Wt 189.0 lb

## 2013-03-29 DIAGNOSIS — E785 Hyperlipidemia, unspecified: Secondary | ICD-10-CM

## 2013-03-29 DIAGNOSIS — I2510 Atherosclerotic heart disease of native coronary artery without angina pectoris: Secondary | ICD-10-CM

## 2013-03-29 DIAGNOSIS — E119 Type 2 diabetes mellitus without complications: Secondary | ICD-10-CM

## 2013-03-29 DIAGNOSIS — Z8546 Personal history of malignant neoplasm of prostate: Secondary | ICD-10-CM

## 2013-03-29 DIAGNOSIS — E1149 Type 2 diabetes mellitus with other diabetic neurological complication: Secondary | ICD-10-CM

## 2013-03-29 DIAGNOSIS — R413 Other amnesia: Secondary | ICD-10-CM

## 2013-03-29 DIAGNOSIS — F3289 Other specified depressive episodes: Secondary | ICD-10-CM

## 2013-03-29 DIAGNOSIS — F329 Major depressive disorder, single episode, unspecified: Secondary | ICD-10-CM

## 2013-03-29 DIAGNOSIS — E039 Hypothyroidism, unspecified: Secondary | ICD-10-CM

## 2013-03-29 DIAGNOSIS — I10 Essential (primary) hypertension: Secondary | ICD-10-CM

## 2013-03-29 DIAGNOSIS — Z0000 Encounter for general adult medical examination without abnormal findings: Secondary | ICD-10-CM

## 2013-03-29 LAB — LIPID PANEL
Cholesterol: 128 mg/dL (ref 0–200)
HDL: 36 mg/dL — ABNORMAL LOW (ref >39.00)
LDL Cholesterol: 65 mg/dL (ref 0–99)
Total CHOL/HDL Ratio: 4
Triglycerides: 133 mg/dL (ref 0.0–149.0)
VLDL: 26.6 mg/dL (ref 0.0–40.0)

## 2013-03-29 NOTE — Progress Notes (Signed)
Subjective:    Patient ID: [name] [name], male    DOB: [birth year], 79 y.o.   MRN: [id number]  HPI  79 year old patient who is seen today for a preventive health examination. He is followed by oncology for metastatic prostate cancer.  He's had a recent hospital admission and nuclear stress test that was normal. He does have a history of CAD. His main problem recently is uncontrolled diabetes. Hemoglobin A1c 3 months ago was 6.0. More recently has had poor glycemic control with blood sugars often in excess of 300. He generally feels poor today with weakness and general sense of unwellness. There's been no weight loss. Most recent PSA was 1. Denies any significant skeletal pain except for right knee pain. He has seen orthopedics recently and had only modest benefit from a intra-articular steroid injection  Wt Readings from Last 3 Encounters:  03/29/13 189 lb (85.73 kg)  03/22/13 186 lb 6.4 oz (84.55 kg)  03/16/13 187 lb (84.823 kg)    Past Medical History  Diagnosis Date  . Hypertension   . Hyperlipidemia   . Memory loss     mild  . Rectal ulcer     RESOLVED  . Constipation   . History of ventricular fibrillation 1998    while on treadmill  . Hypothyroidism   . IBS (irritable bowel syndrome)   . Colitis     RESOLVED  . ED (erectile dysfunction)   . Osteoporosis   . Depression   . Diabetes mellitus     fasting blood sugar 140-200 usually  . GERD (gastroesophageal reflux disease)   . Heart attack 2009  . Dementia   . Neuromuscular disorder     DIABETIC NEUROPATHY  . Prostate cancer   . Pain     LOWER BACK  -- PAST HX OF STEROID EPIDURALS FOR PAIN RELIEF  . 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 CAD (coronary artery disease)     a. S/P CABG x 5 1998; b.  S/P stent LCX 2009;  c.  LHC 5/09: RCA proximal 90%, 95%, distally occluded, pCFX  stents patent, OM1 30%, small left atrial branch 95%, oLAD occluded, S-dRCA patent, L-LAD/Dx patent, EF 60%=> Med Rx  ;  d.  Lex MV 3/13:  EF 63%, no ischemia  e.  Lex Myoview 03/08/13:  EF 56%, inf and apical defect suggestive of thinning and soft tissue attenuation, no ischemia; low risk.      History   Social History  . Marital Status: Widowed    Spouse Name: N/A    Number of Children: 4  . Years of Education: N/A   Occupational History  . retired    Social History Main Topics  . Smoking status: Former Games developer  . Smokeless tobacco: Former Neurosurgeon    Quit date: 02/11/1948  . Alcohol Use: No  . Drug Use: No  . Sexual Activity: Not on file   Other Topics Concern  . Not on file   Social History Narrative  . No narrative on file    Past Surgical History  Procedure Laterality Date  . Coronary artery bypass graft  1998    x5, with a left internal mammary to the LAD and diagonal, vein graft to the OM1 and 2, and a vein graft to the right coronary artery  . Coronary stent placement  11/13/2007    LCX  . Trigger  finger repaired  2009  . Laparoscopic gastrectomy N/A 08/18/2012    Procedure: Laparoscopic Partial Gastrectomy;  Surgeon: Ardeth Sportsman, MD;  Location: [facility] ORS;  Service: General;  Laterality: N/A;  Laparoscopic Partial Gastrectomy,    Family History  Problem Relation Age of Onset  . Stroke Father   . Pneumonia Father   . Colon cancer Neg Hx     Allergies  Allergen Reactions  . Codeine Phosphate Other (See Comments)    mouth sores    Current Outpatient Prescriptions on File Prior to Visit  Medication Sig Dispense Refill  . abiraterone Acetate (ZYTIGA) 250 MG tablet Take 4 tablets (1,000 mg total) by mouth daily. Take on an empty stomach 1 hour before or 2 hours after a meal  120 tablet  1  . ACCU-CHEK SOFTCLIX LANCETS lancets 1 each by Other route 2 (two) times daily as needed for other. Use as instructed  100 each  12  . acetaminophen (TYLENOL) 500 MG tablet Take  1,000 mg by mouth every 6 (six) hours as needed for pain.      [name] amLODipine-valsartan (EXFORGE) 10-320 MG per tablet Take 1 tablet by mouth daily.      [name] aspirin EC 81 MG tablet Take 81 mg by mouth every morning.       [name] atorvastatin (LIPITOR) 10 MG tablet Take 10 mg by mouth daily.      . calcium carbonate-magnesium hydroxide (ROLAIDS) 334 MG CHEW Chew 1 tablet by mouth 2 (two) times daily as needed (indigestion).       . Calcium Citrate-Vitamin D (CITRACAL + D PO) Take 1 tablet by mouth daily.      . Denosumab (XGEVA ) Inject into the skin every 30 (thirty) days. Injects "around the first part of each month."      . docusate sodium (COLACE) 50 MG capsule Take 50-100 mg by mouth 2 (two) times daily.       [name] donepezil (ARICEPT) 10 MG tablet Take 10 mg by mouth daily.      [name] esomeprazole (NEXIUM) 20 MG capsule Take 20 mg by mouth daily as needed (indigestion).      [name] glimepiride (AMARYL) 2 MG tablet Take 4 mg by mouth daily before breakfast.       . glucose blood (ACCU-CHEK SMARTVIEW) test strip 1 each by Other route 2 (two) times daily as needed for other. Use as instructed  100 each  12  . leuprolide (LUPRON) 3.75 MG injection Inject 3.75 mg into the muscle every 4 (four) months.      . levothyroxine (SYNTHROID, LEVOTHROID) 50 MCG tablet Take 50 mcg by mouth every morning.       . metFORMIN (GLUCOPHAGE) 500 MG tablet Take 500 mg by mouth 2 (two) times daily with a meal.      . nitroGLYCERIN (NITROSTAT) 0.4 MG SL tablet Place 0.4 mg under the tongue every 5 (five) minutes as needed. For chest pain      . Nutritional Supplements (JUICE PLUS FIBRE PO) Take 6 capsules by mouth daily.       [name] omega-3 acid ethyl esters (LOVAZA) 1 G capsule Take 1 g by mouth 2 (two) times daily.      [name] oxybutynin (DITROPAN-XL) 10 MG 24 hr tablet Take 10 mg by mouth daily.       . pregabalin (LYRICA) 50 MG capsule Take 50-100 mg by mouth See admin instructions. 100mg  in the morning and 50mg  in the evening      .  sitaGLIPtin (JANUVIA) 50 MG tablet Take 50 mg by mouth 2 (two) times daily.      [name] triamcinolone ointment (KENALOG) 0.1 %       . [DISCONTINUED] chlorthalidone (HYGROTON) 25 MG tablet Take 12.5 mg by mouth daily.       No current facility-administered medications on file prior to visit.    BP 122/70  Pulse 54  Temp(Src) 98.1 F (36.7 C) (Oral)  Resp 20  Ht 5' 7.5" (1.715 m)  Wt 189 lb (85.73 kg)  BMI 29.15 kg/m2  SpO2 98%   1. Risk factors, based on past  M,S,F history- patient has known coronary artery disease. Cardiac risk factors include diabetes hypertension and dyslipidemia  2.  Physical activities: Very sedentary; patient has metastatic prostate cancer and has developed a progressive weakness  3.  Depression/mood: No history of depression or mood disorder  4.  Hearing: No major deficits  5.  ADL's: Requires assistance with some aspects of daily living  6.  Fall risk: High due to general weakness and unsteady gait. He has a history of a peripheral diabetic neuropathy  7.  Home safety: No problems identified  8.  Height weight, and visual acuity; height and weight stable no change in visual acuity is followed by Dr. mild the least twice annually  9.  Counseling: Followup ophthalmology and oncology as well as urology  10. Lab orders based on risk factors: Recent laboratory studies nuclear stress test and bone scan in July reviewed  11. Referral : Followup oncology orthopedics ophthalmology and urology  12. Care plan: We'll check an A1c today. Patient is very resistant to insulin therapy 13. Cognitive assessment: Alert and oriented with cognitive impairment       Review of Systems  Constitutional: Positive for activity change and fatigue. Negative for fever, chills and appetite change.  HENT: Negative for hearing loss, ear pain, congestion, rhinorrhea, sneezing, mouth sores, trouble swallowing, neck pain, neck stiffness, dental problem, voice change, sinus pressure  and tinnitus.   Eyes: Negative for photophobia, pain, redness and visual disturbance.  Respiratory: Negative for apnea, cough, choking, chest tightness, shortness of breath and wheezing.   Cardiovascular: Negative for chest pain, palpitations and leg swelling.  Gastrointestinal: Negative for nausea, vomiting, abdominal pain, diarrhea, constipation, blood in stool, abdominal distention, anal bleeding and rectal pain.  Genitourinary: Negative for dysuria, urgency, frequency, hematuria, flank pain, decreased urine volume, discharge, penile swelling, scrotal swelling, difficulty urinating, genital sores and testicular pain.  Musculoskeletal: Positive for joint swelling, arthralgias and gait problem. Negative for myalgias and back pain.  Skin: Negative for color change, rash and wound.  Neurological: Positive for weakness. Negative for dizziness, tremors, seizures, syncope, facial asymmetry, speech difficulty, light-headedness, numbness and headaches.  Hematological: Negative for adenopathy. Does not bruise/bleed easily.  Psychiatric/Behavioral: Positive for confusion and decreased concentration. Negative for suicidal ideas, hallucinations, behavioral problems, sleep disturbance, self-injury, dysphoric mood and agitation. The patient is not nervous/anxious.        Objective:   Physical Exam  Constitutional: He appears well-developed and well-nourished.  HENT:  Head: Normocephalic and atraumatic.  Right Ear: External ear normal.  Left Ear: External ear normal.  Nose: Nose normal.  Mouth/Throat: Oropharynx is clear and moist.  Eyes: Conjunctivae and EOM are normal. Pupils are equal, round, and reactive to light. No scleral icterus.  Neck: Normal range of motion. Neck supple. No JVD present. No thyromegaly present.  Cardiovascular: Regular rhythm, normal heart sounds and intact distal pulses.  Exam reveals  no gallop and no friction rub.   No murmur heard. Dorsalis pedis pulse is absent. Posterior  tibial pulses full  Pulmonary/Chest: Effort normal and breath sounds normal. He exhibits no tenderness.  Abdominal: Soft. Bowel sounds are normal. He exhibits no distension and no mass. There is no tenderness.  Genitourinary: Prostate normal and penis normal.  Musculoskeletal: Normal range of motion. He exhibits edema and tenderness.  Moderate right knee effusion with excessive warmth and tenderness  Lymphadenopathy:    He has no cervical adenopathy.  Neurological: He is alert. He has normal reflexes. No cranial nerve deficit. Coordination normal.  Intact to monofilament testing and vibratory sensation Achilles reflex is absent  Skin: Skin is warm and dry. No rash noted.  Psychiatric: He has a normal mood and affect. His behavior is normal.          Assessment & Plan:   Preventive health examination Diabetes mellitus. Poor control. We'll discuss insulin therapy with family. Check hemoglobin A1c Hypertension controlled Dyslipidemia. Check lipid profile CAD stable. Recent normal nuclear stress test Metastatic prostate cancer

## 2013-03-29 NOTE — Patient Instructions (Signed)
Limit your sodium (Salt) intake  High-fiber diet    It is important that you exercise regularly, at least 20 minutes 3 to 4 times per week.  If you develop chest pain or shortness of breath seek  medical attention.

## 2013-04-15 ENCOUNTER — Encounter: Payer: Self-pay | Admitting: Neurology

## 2013-04-15 ENCOUNTER — Ambulatory Visit (INDEPENDENT_AMBULATORY_CARE_PROVIDER_SITE_OTHER): Payer: Medicare Other | Admitting: Neurology

## 2013-04-15 VITALS — BP 140/75 | HR 51 | Temp 97.7°F | Ht 68.5 in | Wt 185.0 lb

## 2013-04-15 DIAGNOSIS — E1149 Type 2 diabetes mellitus with other diabetic neurological complication: Secondary | ICD-10-CM

## 2013-04-15 DIAGNOSIS — R413 Other amnesia: Secondary | ICD-10-CM

## 2013-04-15 DIAGNOSIS — I259 Chronic ischemic heart disease, unspecified: Secondary | ICD-10-CM

## 2013-04-15 DIAGNOSIS — M1711 Unilateral primary osteoarthritis, right knee: Secondary | ICD-10-CM

## 2013-04-15 NOTE — Progress Notes (Signed)
Subjective:    Patient ID: [name] [name] is a 79 y.o. male.  HPI  Interim history:   Mr. Route is a very pleasant 79 year old right-handed gentleman, who presents for followup consultation of his memory loss and neuropathy. He is unaccompanied today. He is not feeling too well d/t tooth ache and he has been seeing a dentist for this and will call him today. This is his first visit with me and he previously followed with Dr. Avie Echevaria and was last seen by him on 09/10/2012, at which time Dr. Sandria Manly felt he was stable and he did not make any changes to his medication regimen. He has a complex underlying medical history of diabetes with neuropathy, prostate cancer, stomach cancer, osteoporosis, heart disease, hyperlipidemia, thyroid disease and hypertension. He is status post three-vessel CABG in 1998, MI in 2009 with stent placement and has received Lupron therapy for his prostate cancer, as well as radiation therapy and his gastric tumor was diagnosed by biopsy in August 2012, s/p surgery in 3/14. He is on medications for his cancer. He took the wrong turn coming to the office today, which is new. He has R knee pain. He sees a Investment banker, operational. He broke his R arm at age 28 and has loss of ROM d/t that. His L leg pain is better.   I reviewed Dr. Imagene Gurney prior notes and the patient's records and below is a summary of that review:  He has had MCI, and was initially evaluated on 02/24/1997 at the request of Dr. Roger Shelter for memory loss which, began after surgery with coronary artery bypass on 12/02/1996. Evaluation included B12 level, RPR, neuropsychological battery, and MRI study study of the brain 02/26/1997. Doppler study of the carotids  showed 50-69% right ECA stenosis. He has short-term memory loss which has been stable. He has been on Aricept for about 15 years now. He worked as a Clinical research associate. He drives a car and is independent in ADLs. He has had daytime sleepiness. In January 2012 he developed severe  rectal pain which progressed to go into his left leg posteriorly to behind the knee and into the left foot. It can go into his scrotum and into his penis. MRI of the lumbar spine without contrast on 04/09/10, 09/07/10, and 01/07/11 showed mild DJD. MRI of the pelvis without contrast on 05/07/10 showed rectal wall thickening and no pelvic adenopathy. There was mild diffuse bladder wall thickness. 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 Bone scan 02/07/2011 showed possible metastatic process at T9. He has had bowel and bladder accidents. On 03/08/11, his MMSE was 29/30. He has left foot numbness on the bottom of his foot. His pain is 0-8/10. He has discontinued a fentanyl patch and Nucynta. In 08/2011 he developed pain in his right side including the buttocks extending to the bottom of his right foot with numbness. He had an epidural injection at L5-S1 for his severe pain with improvement in symptoms. His pain is made worse by bending and stooping. MRI of the lumbar spine on 10/05/2011 showed a focal disc protrusion at L5-S1 to the right of midline and small sclerotic metastasis to the the posterior aspect of the left iliac bone.     His Past Medical History Is Significant For: Past Medical History  Diagnosis Date  . Hypertension   . Hyperlipidemia   . Memory loss  mild  . Rectal ulcer     RESOLVED  . Constipation   . History of ventricular fibrillation 1998    while on treadmill  . Hypothyroidism   . IBS (irritable bowel syndrome)   . Colitis     RESOLVED  . ED (erectile dysfunction)   . Osteoporosis   . Depression   . Diabetes mellitus     fasting blood sugar 140-200 usually  . GERD (gastroesophageal reflux disease)   . Heart attack 2009  . Dementia   . Neuromuscular disorder     DIABETIC NEUROPATHY  . Prostate cancer   . Pain     LOWER BACK  -- PAST HX OF STEROID EPIDURALS FOR PAIN  RELIEF  . 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 CAD (coronary artery disease)     a. S/P CABG x 5 1998; b.  S/P stent LCX 2009;  c.  LHC 5/09: RCA proximal 90%, 95%, distally occluded, pCFX stents patent, OM1 30%, small left atrial branch 95%, oLAD occluded, S-dRCA patent, L-LAD/Dx patent, EF 60%=> Med Rx  ;  d.  Lex MV 3/13:  EF 63%, no ischemia  e.  Lex Myoview 03/08/13:  EF 56%, inf and apical defect suggestive of thinning and soft tissue attenuation, no ischemia; low risk.      His Past Surgical History Is Significant For: Past Surgical History  Procedure Laterality Date  . Coronary artery bypass graft  1998    x5, with a left internal mammary to the LAD and diagonal, vein graft to the OM1 and 2, and a vein graft to the right coronary artery  . Coronary stent placement  11/13/2007    LCX  . Trigger finger repaired  2009  . Laparoscopic gastrectomy N/A 08/18/2012    Procedure: Laparoscopic Partial Gastrectomy;  Surgeon: Ardeth Sportsman, MD;  Location: [facility];  Service: General;  Laterality: N/A;  Laparoscopic Partial Gastrectomy,    His Family History Is Significant For: Family History  Problem Relation Age of Onset  . Stroke Father   . Pneumonia Father   . Colon cancer Neg Hx     His Social History Is Significant For: History   Social History  . Marital Status: Widowed    Spouse Name: N/A    Number of Children: 4  . Years of Education: N/A   Occupational History  . retired    Social History Main Topics  . Smoking status: Former Games developer  . Smokeless tobacco: Former Neurosurgeon    Quit date: 02/11/1948  . Alcohol Use: No  . Drug Use: No  . Sexual Activity: None   Other Topics Concern  . None   Social History Narrative  . None    His Allergies Are:  Allergies  Allergen Reactions  . Codeine Phosphate Other (See Comments)    mouth  sores  :   His Current Medications Are:  Outpatient Encounter Prescriptions as of 04/15/2013  Medication Sig Dispense Refill  . abiraterone Acetate (ZYTIGA) 250 MG tablet Take 4 tablets (1,000 mg total) by mouth daily. Take on an empty stomach 1 hour before or 2 hours after a meal  120 tablet  1  . ACCU-CHEK SOFTCLIX LANCETS lancets 1 each by Other route 2 (two) times daily as needed for other. Use as instructed  100 each  12  . acetaminophen (TYLENOL) 500 MG tablet Take 1,000  mg by mouth every 6 (six) hours as needed for pain.      [name] amLODipine-valsartan (EXFORGE) 10-320 MG per tablet Take 1 tablet by mouth daily.      [name] aspirin EC 81 MG tablet Take 81 mg by mouth every morning.       [name] atorvastatin (LIPITOR) 10 MG tablet Take 10 mg by mouth daily.      . calcium carbonate-magnesium hydroxide (ROLAIDS) 334 MG CHEW Chew 1 tablet by mouth 2 (two) times daily as needed (indigestion).       . Calcium Citrate-Vitamin D (CITRACAL + D PO) Take 1 tablet by mouth daily.      . Denosumab (XGEVA [place]) Inject into the skin every 30 (thirty) days. Injects "around the first part of each month."      . docusate sodium (COLACE) 50 MG capsule Take 50-100 mg by mouth 2 (two) times daily.       [name] donepezil (ARICEPT) 10 MG tablet Take 10 mg by mouth daily.      [name] esomeprazole (NEXIUM) 20 MG capsule Take 20 mg by mouth daily as needed (indigestion).      [name] glimepiride (AMARYL) 2 MG tablet Take 4 mg by mouth daily before breakfast.       . glucose blood (ACCU-CHEK SMARTVIEW) test strip 1 each by Other route 2 (two) times daily as needed for other. Use as instructed  100 each  12  . leuprolide (LUPRON) 3.75 MG injection Inject 3.75 mg into the muscle every 4 (four) months.      . levothyroxine (SYNTHROID, LEVOTHROID) 50 MCG tablet Take 50 mcg by mouth every morning.       . metFORMIN (GLUCOPHAGE) 500 MG tablet Take 500 mg by mouth 2 (two) times daily with a meal.      . nitroGLYCERIN (NITROSTAT) 0.4 MG SL tablet  Place 0.4 mg under the tongue every 5 (five) minutes as needed. For chest pain      . Nutritional Supplements (JUICE PLUS FIBRE PO) Take 6 capsules by mouth daily.       [name] omega-3 acid ethyl esters (LOVAZA) 1 G capsule Take 1 g by mouth 2 (two) times daily.      [name] oxybutynin (DITROPAN-XL) 10 MG 24 hr tablet Take 10 mg by mouth daily.       . pregabalin (LYRICA) 50 MG capsule Take 50-100 mg by mouth See admin instructions. 100mg  in the morning and 50mg  in the evening      . sitaGLIPtin (JANUVIA) 50 MG tablet Take 50 mg by mouth 2 (two) times daily.      [name] triamcinolone ointment (KENALOG) 0.1 %        No facility-administered encounter medications on file as of 04/15/2013.  : Review of Systems  Constitutional: Positive for fatigue.  Neurological:       Memory loss  Psychiatric/Behavioral: Positive for confusion.    Objective:  Neurologic Exam  Physical Exam Physical Examination:   Filed Vitals:   04/15/13 0927  BP: 140/75  Pulse: 51  Temp: 97.7 F (36.5 C)    General Examination: The patient is a very pleasant 79 y.o. male in no acute distress. He is calm and cooperative with the exam. He denies Auditory Hallucinations and Visual Hallucinations. He is well groomed and situated in a chair.   HEENT: Normocephalic, atraumatic, pupils are equal, round and reactive to light and accommodation. He has cataracts, and R ptosis (this since birth) Extraocular tracking shows mild saccadic  breakdown without nystagmus noted. Hearing is impaired. Face is symmetric with no facial masking and normal facial sensation. There is no lip, neck or jaw tremor. Neck is not rigid with intact passive ROM. There are no carotid bruits on auscultation. Oropharynx exam reveals mild mouth dryness. No significant airway crowding is noted. Mallampati is class II. Tongue protrudes centrally and palate elevates symmetrically.    Chest: is clear to auscultation without wheezing, rhonchi or crackles noted.  Heart:  sounds are regular and normal without murmurs, rubs or gallops noted.   Abdomen: is soft, non-tender and non-distended with normal bowel sounds appreciated on auscultation.  Extremities: There is 2+ pitting edema in the distal lower extremities bilaterally. Pedal pulses are intact.   Skin: is warm and dry with no trophic changes noted. Age-related changes are noted on the skin. Multiple small bruises of varying ages are noted on the forearms. Of note, He is on ASA.  Musculoskeletal: exam reveals R elbow joint deformity, and tenderness and mild joint swelling in the R knee.    Neurologically:  Mental status: The patient is awake and alert, paying good  attention. He is able to completely provide the history. He is oriented to: person, place, time/date, situation, day of week, month of year and year. His memory, attention, language and knowledge are mildly impaired. There is no aphasia, agnosia, apraxia or anomia. There is a no significant degree of bradyphrenia. Speech is mildly hypophonic with no dysarthria noted. Mood is congruent and affect is blunted and flat.  His MMSE (Mini-Mental state exam) score is 30/30.  CDT (Clock Drawing Test) score is 4/4.  AFT (Animal Fluency Test) score is 4.    Cranial nerves are as described above under HEENT exam. In addition, shoulder shrug is normal with equal shoulder height noted.  Motor exam: Normal bulk, and strength for age is noted. Tone is not rigid with absence of cogwheeling in the extremities. There is overall no significant bradykinesia. There is no drift or rebound. There is no tremor. Romberg is negative. Reflexes are 1+ in the upper extremities, trace in both knees and absent in both ankles. Fine motor skills: Finger taps, hand movements, and rapid alternating patting are mildly impaired bilaterally. Foot taps and foot agility are mildly impaired bilaterally.   Cerebellar testing shows no dysmetria or intention tremor on finger to nose testing.  There is no truncal or gait ataxia.   Sensory exam is intact to light touch, pinprick, vibration, temperature sense in the upper extremities, and decrease to PP and vibration sense in the distal lower extremities.   Gait, station and balance: He stands up from the seated position with mild difficulty and needs to push himself up. No veering to one side is noted. Slight leaning to the R is noted. Posture is mildly stooped. Stance is wide-based. He turns in 3 steps. Tandem walk is not possible. Balance is mildly impaired.     Assessment and Plan:   In summary, Bayden L Bognar is a very pleasant 79 y.o.-year old male with a complex underlying medical history of diabetes, prostate cancer, stomach tumor, osteoporosis, heart disease, hyperlipidemia, thyroid disease and hypertension, status post three-vessel CABG in 1998, MI in 2009 with stent placement on Lupron and Xgeva and Zytiga therapy for his prostate cancer, as well as radiation therapy, who has a several year history of memory loss. His history and physical exam are keeping with MCI (mild cognitive impairment) or mild Vascular Dementia without behavioral disturbance. This is stable.  His physical exam and MMSE/CDT/AFT scores are stable and the disease has not progressed in the last months. He is doing fairly well at this time and I reassured the patient in that regard.  I had a long chat with the patient about my findings and the diagnosis of memory loss, its prognosis and treatment options. We talked about medical treatments and non-pharmacological approaches. We talked about maintaining a healthy lifestyle in general and staying active mentally and physically. I encouraged the patient to eat healthy, exercise daily and keep well hydrated, to keep a scheduled bedtime and wake time routine, to not skip any meals and eat healthy snacks in between meals and to have protein with every meal. I stressed the importance of regular exercise, within of course the  patient's own mobility limitations. I encouraged the patient to keep up with current events by reading the news paper or watching the news and to do word puzzles, or if feasible, to go on [email or web address].   As far as further diagnostic testing is concerned, I suggested the following: no new test.   As far as medications are concerned, I recommended the following at this time: no change.  I answered all his questions today and the patient was in agreement with the above outlined plan. I would like to see the patient back in 6 months, sooner if the need arises and encouraged him to call with any interim questions, concerns, problems, updates and refill requests.

## 2013-04-15 NOTE — Patient Instructions (Addendum)
I think overall you are doing fairly well and are stable at this point.   I do have some generic suggestions for you today:  Please make sure that you drink plenty of fluids. I would like for you to exercise daily for example in the form of walking 20-30 minutes every day, if you can. Please keep a regular sleep-wake schedule, keep regular meal times, do not skip any meals, eat  healthy snacks in between meals, such as fruit or nuts. Try to eat protein with every meal.   As far as your medications are concerned, I would like to suggest: no changes.   As far as diagnostic testing, I recommend: no new test.    Engage in social activities in your community and with your family and try to keep up with current events by reading the newspaper or watching the news. Try to go to [email or web address].[email or web address] on the internet.   I do not think we need to make any changes in your medications at this point. I think you're stable enough that I can see you back in 6 months, sooner if we need to. Please call us if you have any interim questions, concerns, or problems or updates to need to discuss.  Brett Canales is my clinical assistant and will answer any of your questions and relay your messages to me and will give you my messages.   Our phone number is [phone]. We also have an after hours call service for urgent matters and there is a physician on-call for urgent questions. For any emergencies you know to call 911 or go to the nearest emergency room.

## 2013-04-16 ENCOUNTER — Ambulatory Visit: Payer: Medicare Other | Admitting: Dietician

## 2013-04-16 ENCOUNTER — Telehealth: Payer: Self-pay | Admitting: *Deleted

## 2013-04-16 NOTE — Telephone Encounter (Signed)
Per dr Clelia Croft, okay for patient to get a flu and pneumonia shot.

## 2013-04-17 ENCOUNTER — Encounter: Payer: Self-pay | Admitting: Internal Medicine

## 2013-04-17 ENCOUNTER — Other Ambulatory Visit: Payer: Self-pay | Admitting: Internal Medicine

## 2013-04-20 ENCOUNTER — Ambulatory Visit (INDEPENDENT_AMBULATORY_CARE_PROVIDER_SITE_OTHER): Payer: Medicare Other

## 2013-04-20 DIAGNOSIS — Z23 Encounter for immunization: Secondary | ICD-10-CM

## 2013-04-25 ENCOUNTER — Other Ambulatory Visit: Payer: Self-pay | Admitting: Internal Medicine

## 2013-05-04 ENCOUNTER — Other Ambulatory Visit: Payer: Self-pay | Admitting: Internal Medicine

## 2013-05-07 ENCOUNTER — Ambulatory Visit (HOSPITAL_BASED_OUTPATIENT_CLINIC_OR_DEPARTMENT_OTHER): Payer: Medicare Other | Admitting: Oncology

## 2013-05-07 ENCOUNTER — Other Ambulatory Visit (HOSPITAL_BASED_OUTPATIENT_CLINIC_OR_DEPARTMENT_OTHER): Payer: Medicare Other

## 2013-05-07 ENCOUNTER — Telehealth: Payer: Self-pay | Admitting: Oncology

## 2013-05-07 VITALS — BP 167/71 | HR 59 | Temp 97.0°F | Resp 18 | Ht 68.0 in | Wt 191.9 lb

## 2013-05-07 DIAGNOSIS — Z8546 Personal history of malignant neoplasm of prostate: Secondary | ICD-10-CM

## 2013-05-07 DIAGNOSIS — C61 Malignant neoplasm of prostate: Secondary | ICD-10-CM

## 2013-05-07 DIAGNOSIS — C7951 Secondary malignant neoplasm of bone: Secondary | ICD-10-CM

## 2013-05-07 DIAGNOSIS — M25569 Pain in unspecified knee: Secondary | ICD-10-CM

## 2013-05-07 DIAGNOSIS — M25519 Pain in unspecified shoulder: Secondary | ICD-10-CM

## 2013-05-07 LAB — COMPREHENSIVE METABOLIC PANEL
ALT: 20 U/L (ref 0–53)
Alkaline Phosphatase: 54 U/L (ref 39–117)
CO2: 26 mEq/L (ref 19–32)
Creatinine, Ser: 1.16 mg/dL (ref 0.50–1.35)
Total Bilirubin: 0.7 mg/dL (ref 0.3–1.2)

## 2013-05-07 LAB — CBC WITH DIFFERENTIAL/PLATELET
BASO%: 1.1 % (ref 0.0–2.0)
EOS%: 11.5 % — ABNORMAL HIGH (ref 0.0–7.0)
Eosinophils Absolute: 0.5 10*3/uL (ref 0.0–0.5)
LYMPH%: 26.2 % (ref 14.0–49.0)
MCH: 31.3 pg (ref 27.2–33.4)
MCHC: 34.3 g/dL (ref 32.0–36.0)
MCV: 91.4 fL (ref 79.3–98.0)
MONO%: 11.8 % (ref 0.0–14.0)
Platelets: 147 10*3/uL (ref 140–400)
RBC: 3.42 10*6/uL — ABNORMAL LOW (ref 4.20–5.82)
RDW: 13.8 % (ref 11.0–14.6)

## 2013-05-07 NOTE — Telephone Encounter (Signed)
gv adn printed appt sched and avs for pt for Dec °

## 2013-05-07 NOTE — Progress Notes (Signed)
Hematology and Oncology Follow Up Visit  [name] [name] [id number] April 05, 1930 79 y.o. 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 His initial diagnosis was in 2006 when he presented with a PSA of 6.26 the Gleason score 4+3 equals 7.  Prior Therapy:  He is status post definitive therapy with radiation and androgen deprivation completed in 2007. The patient developed recurrent disease in 2012 with a PSA rise up to 28 and T9 osseous metastasis and was treated with combined androgen deprivation with Lupron and Casodex. He had an excellent response with his PSA nadir down to 0.19. Most recently he developed a rise in his PSA up to 3.06 in July 2014 despite castrate levels of testosterone. His bone scan in July 2014 confirmed the presence of bony disease.  Current therapy:  Zytiga 1000 mg daily started in July 2014. He [place] monthly at [facility] u[facility]. He receives Lupron every 4 months at [facility].  Interim History:   The patient returns today for a followup visit. He remains on Zytiga without any complications. He is not reporting any new bone pain at this time. He does have ongoing right knee and right shoulder pain due to arthritis. This is unchanged and predates the start of his Zytiga. Denies fatigue. His appetite is good and weight is stable. No lower extremity edema. No abdominal pain, nausea, vomiting. His performance status is unchanged. He reports no hospitalization or illnesses. Has not reported any of the fluid retention or any bone pain.  Medications: I have reviewed the patient's current medications.  Current Outpatient Prescriptions  Medication Sig Dispense Refill  . abiraterone Acetate (ZYTIGA) 250 MG tablet Take 4 tablets (1,000 mg total) by mouth daily. Take on an empty stomach 1 hour before or 2 hours after a  meal  120 tablet  1  . ACCU-CHEK SOFTCLIX LANCETS lancets 1 each by Other route 2 (two) times daily as needed for other. Use as instructed  100 each  12  . amLODipine-valsartan (EXFORGE) 10-320 MG per tablet Take 1 tablet by mouth daily.      [name] aspirin EC 81 MG tablet Take 81 mg by mouth every morning.       [name] atorvastatin (LIPITOR) 10 MG tablet TAKE ONE TABLET BY MOUTH ONCE DAILY  90 tablet  0  . calcium carbonate-magnesium hydroxide (ROLAIDS) 334 MG CHEW Chew 1 tablet by mouth 2 (two) times daily as needed (indigestion).       . Calcium Citrate-Vitamin D (CITRACAL + D PO) Take 1 tablet by mouth daily.      . Denosumab (XGEVA [place]) Inject into the skin every 30 (thirty) days. Injects "around the first part of each month."      . donepezil (ARICEPT) 10 MG tablet Take 10 mg by mouth daily.      [name] esomeprazole (NEXIUM) 20 MG capsule Take 20 mg by mouth daily as needed (indigestion).      [name] glimepiride (AMARYL) 2 MG tablet Take 4 mg by mouth daily before breakfast.       . glucose blood (ACCU-CHEK SMARTVIEW) test strip 1 each by Other route 2 (two) times daily as needed for other. Use as instructed  100 each  12  . leuprolide (LUPRON) 3.75 MG injection Inject 3.75 mg into the muscle every 4 (four) months.      . levothyroxine (SYNTHROID, LEVOTHROID) 50 MCG tablet  TAKE ONE TABLET BY MOUTH EVERY DAY  90 tablet  0  . metFORMIN (GLUCOPHAGE) 500 MG tablet Take 500 mg by mouth 2 (two) times daily with a meal.      . Misc Natural Products (OSTEO BI-FLEX ADV DOUBLE ST) TABS Take by mouth.      . nitroGLYCERIN (NITROSTAT) 0.4 MG SL tablet Place 0.4 mg under the tongue every 5 (five) minutes as needed. For chest pain      . Nutritional Supplements (JUICE PLUS FIBRE PO) Take 6 capsules by mouth daily.       [name] omega-3 acid ethyl esters (LOVAZA) 1 G capsule Take 1 g by mouth 2 (two) times daily.      [name] oxybutynin (DITROPAN-XL) 10 MG 24 hr tablet Take 10 mg by mouth daily.       . pregabalin (LYRICA) 50 MG capsule  Take 50-100 mg by mouth See admin instructions. 100mg  in the morning and 50mg  in the evening      . sitaGLIPtin (JANUVIA) 50 MG tablet Take 50 mg by mouth 2 (two) times daily.      [name] triamcinolone ointment (KENALOG) 0.1 %       . acetaminophen (TYLENOL) 500 MG tablet Take 1,000 mg by mouth every 6 (six) hours as needed for pain.      [name] docusate sodium (COLACE) 50 MG capsule Take 50-100 mg by mouth 2 (two) times daily.       . [DISCONTINUED] chlorthalidone (HYGROTON) 25 MG tablet Take 12.5 mg by mouth daily.       No current facility-administered medications for this visit.     Allergies:  Allergies  Allergen Reactions  . Codeine Phosphate Other (See Comments)    mouth sores    Past Medical History, Surgical history, Social history, and Family History were reviewed and updated.  Review of Systems:  Remaining ROS negative.  Physical Exam: Blood pressure 167/71, pulse 59, temperature 97 F (36.1 C), temperature source Oral, resp. rate 18, height 5\' 8"  (1.727 m), weight 191 lb 14.4 oz (87.045 kg). ECOG: 1 General appearance: alert, cooperative and no distress Head: Normocephalic, without obvious abnormality, atraumatic Neck: no adenopathy, no carotid bruit, no JVD, supple, symmetrical, trachea midline and thyroid not enlarged, symmetric, no tenderness/mass/nodules Lymph nodes: Cervical, supraclavicular, and axillary nodes normal. Heart:regular rate and rhythm, S1, S2 normal, no murmur, click, rub or gallop Lung:chest clear, no wheezing, rales, normal symmetric air entry, no tachypnea, retractions or cyanosis Abdomen: soft, non-tender, without masses or organomegaly EXT:no erythema, induration, or nodules   Lab Results: Lab Results  Component Value Date   WBC 4.1 05/07/2013   HGB 10.7* 05/07/2013   HCT 31.2* 05/07/2013   MCV 91.4 05/07/2013   PLT 147 05/07/2013     Chemistry      Component Value Date/Time   NA 141 03/22/2013 1236   NA 139 02/22/2013 0254   K 4.3 03/22/2013 1236    K 3.8 02/22/2013 0254   CL 107 02/22/2013 0254   CO2 26 03/22/2013 1236   CO2 24 02/22/2013 0254   BUN 19.0 03/22/2013 1236   BUN 27* 02/22/2013 0254   CREATININE 1.4* 03/22/2013 1236   CREATININE 1.40* 02/22/2013 0254   CREATININE 1.38* 02/11/2012 1614      Component Value Date/Time   CALCIUM 9.2 03/22/2013 1236   CALCIUM 9.0 02/22/2013 0254   ALKPHOS 68 03/22/2013 1236   ALKPHOS 63 02/21/2013 2117   AST 13 03/22/2013 1236   AST 18 02/21/2013 2117  ALT 15 03/22/2013 1236   ALT 18 02/21/2013 2117   BILITOT 1.01 03/22/2013 1236   BILITOT 0.5 02/21/2013 2117     Results for RUTGER, SALTON (MRN [id number]) as of 05/07/2013 15:07  Ref. Range 01/13/2013 11:26 02/19/2013 09:19 03/22/2013 12:36  PSA Latest Range: <=4.00 ng/mL 3.07 2.63 1.12     Impression and Plan: This is an 79 year old gentleman with the following issues: 1. 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 His initial diagnosis dates back to 2006 with PSA of 6.26 and a Gleason score of 7. He is currently on Zytiga since July 2014 is tolerating it well. PSA is down to 1.12 and is pending today. The plan is to continue on Zytiga. We will monitor his PSA, electrolytes, and liver function monthly.  2. Bony disease: He is receiving Xgeva at [facility] urology on a monthly basis.  3. Hormonal deprivation: He is receiving Lupron at [facility] urology every 4 months.  4. Followup: He will return in 4-5 weeks.   Jaegar Croft 11/7/20144:01 PM

## 2013-05-07 NOTE — Telephone Encounter (Signed)
gv and printed appt sched and avs for pt for DEC °

## 2013-05-08 LAB — PSA: PSA: 0.74 ng/mL (ref <=4.00)

## 2013-05-10 ENCOUNTER — Ambulatory Visit (INDEPENDENT_AMBULATORY_CARE_PROVIDER_SITE_OTHER): Payer: Medicare Other | Admitting: Internal Medicine

## 2013-05-10 ENCOUNTER — Encounter: Payer: Self-pay | Admitting: Internal Medicine

## 2013-05-10 VITALS — BP 150/72 | HR 55 | Temp 97.6°F | Resp 20 | Wt 193.0 lb

## 2013-05-10 DIAGNOSIS — Z23 Encounter for immunization: Secondary | ICD-10-CM

## 2013-05-10 DIAGNOSIS — E1149 Type 2 diabetes mellitus with other diabetic neurological complication: Secondary | ICD-10-CM

## 2013-05-10 DIAGNOSIS — E119 Type 2 diabetes mellitus without complications: Secondary | ICD-10-CM

## 2013-05-10 DIAGNOSIS — I10 Essential (primary) hypertension: Secondary | ICD-10-CM

## 2013-05-10 MED ORDER — DAPAGLIFLOZIN PROPANEDIOL 10 MG PO TABS: 1.0000 | ORAL_TABLET | Freq: Every day | ORAL | Status: DC

## 2013-05-10 MED ORDER — GLIMEPIRIDE 4 MG PO TABS: 4.0000 mg | ORAL_TABLET | Freq: Every day | ORAL | Status: DC

## 2013-05-10 NOTE — Progress Notes (Signed)
Subjective:    Patient ID: [name] [name], male    DOB: 08/08/1929, 79 y.o.   MRN: [id number]  HPI Pre-visit discussion using our clinic review tool. No additional management support is needed unless otherwise documented below in the visit note.  79 year old patient who is seen today for followup.  Over the past month he has been evaluated by oncology as well as neurology. He has type 2 diabetes controlled on oral agents. He is on a submaximal dose of metformin due to GI intolerance. He remains on Amaryl and blood sugars are in the 190 range. His last hemoglobin A1c was elevated greater than 8  He continues to have intermittent diarrhea  Past Medical History  Diagnosis Date  . Hypertension   . Hyperlipidemia   . Memory loss     mild  . Rectal ulcer     RESOLVED  . Constipation   . History of ventricular fibrillation 1998    while on treadmill  . Hypothyroidism   . IBS (irritable bowel syndrome)   . Colitis     RESOLVED  . ED (erectile dysfunction)   . Osteoporosis   . Depression   . Diabetes mellitus     fasting blood sugar 140-200 usually  . GERD (gastroesophageal reflux disease)   . Heart attack 2009  . Dementia   . Neuromuscular disorder     DIABETIC NEUROPATHY  . Prostate cancer   . Pain     LOWER BACK  -- PAST HX OF STEROID EPIDURALS FOR PAIN RELIEF  . 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 CAD (coronary artery disease)     a. S/P CABG x 5 1998; b.  S/P stent LCX 2009;  c.  LHC 5/09: RCA proximal 90%, 95%, distally occluded, pCFX stents patent, OM1 30%, small left atrial branch 95%, oLAD occluded, S-dRCA patent, L-LAD/Dx patent, EF 60%=> Med Rx  ;  d.  Lex MV 3/13:  EF 63%, no ischemia  e.  Lex Myoview 03/08/13:  EF 56%, inf and apical defect suggestive of thinning and soft tissue attenuation, no ischemia; low risk.      History    Social History  . Marital Status: Widowed    Spouse Name: N/A    Number of Children: 4  . Years of Education: N/A   Occupational History  . retired    Social History Main Topics  . Smoking status: Former Games developer  . Smokeless tobacco: Former Neurosurgeon    Quit date: 02/11/1948  . Alcohol Use: No  . Drug Use: No  . Sexual Activity: Not on file   Other Topics Concern  . Not on file   Social History Narrative  . No narrative on file    Past Surgical History  Procedure Laterality Date  . Coronary artery bypass graft  1998    x5, with a left internal mammary to the LAD and diagonal, vein graft to the OM1 and 2, and a vein graft to the right coronary artery  . Coronary stent placement  11/13/2007    LCX  . Trigger finger repaired  2009  . Laparoscopic gastrectomy N/A 08/18/2012    Procedure: Laparoscopic Partial Gastrectomy;  Surgeon: [name] Sportsman, MD;  Location: [facility];  Service: General;  Laterality: N/A;  Laparoscopic Partial Gastrectomy,    Family History  Problem Relation Age of Onset  . Stroke Father   .  Pneumonia Father   . Colon cancer Neg Hx     Allergies  Allergen Reactions  . Codeine Phosphate Other (See Comments)    mouth sores    Current Outpatient Prescriptions on File Prior to Visit  Medication Sig Dispense Refill  . abiraterone Acetate (ZYTIGA) 250 MG tablet Take 4 tablets (1,000 mg total) by mouth daily. Take on an empty stomach 1 hour before or 2 hours after a meal  120 tablet  1  . ACCU-CHEK SOFTCLIX LANCETS lancets 1 each by Other route 2 (two) times daily as needed for other. Use as instructed  100 each  12  . acetaminophen (TYLENOL) 500 MG tablet Take 1,000 mg by mouth every 6 (six) hours as needed for pain.      [name] amLODipine-valsartan (EXFORGE) 10-320 MG per tablet Take 1 tablet by mouth daily.      [name] aspirin EC 81 MG tablet Take 81 mg by mouth every morning.       [name] atorvastatin (LIPITOR) 10 MG tablet TAKE ONE TABLET BY MOUTH ONCE DAILY  90 tablet   0  . calcium carbonate-magnesium hydroxide (ROLAIDS) 334 MG CHEW Chew 1 tablet by mouth 2 (two) times daily as needed (indigestion).       . Calcium Citrate-Vitamin D (CITRACAL + D PO) Take 1 tablet by mouth daily.      . Denosumab (XGEVA [name]) Inject into the skin every 30 (thirty) days. Injects "around the first part of each month."      . docusate sodium (COLACE) 50 MG capsule Take 50-100 mg by mouth 2 (two) times daily.       [name] donepezil (ARICEPT) 10 MG tablet Take 10 mg by mouth daily.      [name] esomeprazole (NEXIUM) 20 MG capsule Take 20 mg by mouth daily as needed (indigestion).      [name] glucose blood (ACCU-CHEK SMARTVIEW) test strip 1 each by Other route 2 (two) times daily as needed for other. Use as instructed  100 each  12  . leuprolide (LUPRON) 3.75 MG injection Inject 3.75 mg into the muscle every 4 (four) months.      . levothyroxine (SYNTHROID, LEVOTHROID) 50 MCG tablet TAKE ONE TABLET BY MOUTH EVERY DAY  90 tablet  0  . metFORMIN (GLUCOPHAGE) 500 MG tablet Take 500 mg by mouth 2 (two) times daily with a meal.      . Misc Natural Products (OSTEO BI-FLEX ADV DOUBLE ST) TABS Take by mouth.      . nitroGLYCERIN (NITROSTAT) 0.4 MG SL tablet Place 0.4 mg under the tongue every 5 (five) minutes as needed. For chest pain      . Nutritional Supplements (JUICE PLUS FIBRE PO) Take 6 capsules by mouth daily.       [name] omega-3 acid ethyl esters (LOVAZA) 1 G capsule Take 1 g by mouth 2 (two) times daily.      [name] oxybutynin (DITROPAN-XL) 10 MG 24 hr tablet Take 10 mg by mouth daily.       . pregabalin (LYRICA) 50 MG capsule Take 50-100 mg by mouth See admin instructions. 100mg  in the morning and 50mg  in the evening      . sitaGLIPtin (JANUVIA) 50 MG tablet Take 50 mg by mouth 2 (two) times daily.      [name] triamcinolone ointment (KENALOG) 0.1 %       . [DISCONTINUED] chlorthalidone (HYGROTON) 25 MG tablet Take 12.5 mg by mouth daily.       No current  facility-administered medications on file prior to  visit.    BP 150/72  Pulse 55  Temp(Src) 97.6 F (36.4 C) (Oral)  Resp 20  Wt 193 lb (87.544 kg)  SpO2 97%       Review of Systems  Constitutional: Negative for fever, chills, appetite change and fatigue.  HENT: Negative for congestion, dental problem, ear pain, hearing loss, sore throat, tinnitus, trouble swallowing and voice change.   Eyes: Negative for pain, discharge and visual disturbance.  Respiratory: Negative for cough, chest tightness, wheezing and stridor.   Cardiovascular: Negative for chest pain, palpitations and leg swelling.  Gastrointestinal: Positive for diarrhea and constipation. Negative for nausea, vomiting, abdominal pain, blood in stool and abdominal distention.  Genitourinary: Negative for urgency, hematuria, flank pain, discharge, difficulty urinating and genital sores.  Musculoskeletal: Negative for arthralgias, back pain, gait problem, joint swelling, myalgias and neck stiffness.  Skin: Negative for rash.  Neurological: Negative for dizziness, syncope, speech difficulty, weakness, numbness and headaches.  Hematological: Negative for adenopathy. Does not bruise/bleed easily.  Psychiatric/Behavioral: Negative for behavioral problems and dysphoric mood. The patient is not nervous/anxious.        Objective:   Physical Exam  Constitutional: He is oriented to person, place, and time. He appears well-developed.  HENT:  Head: Normocephalic.  Right Ear: External ear normal.  Left Ear: External ear normal.  Eyes: Conjunctivae and EOM are normal.  Neck: Normal range of motion.  Cardiovascular: Normal rate and normal heart sounds.   Pulmonary/Chest: Breath sounds normal.  Abdominal: Bowel sounds are normal.  Musculoskeletal: Normal range of motion. He exhibits no edema and no tenderness.  Neurological: He is alert and oriented to person, place, and time.  Psychiatric: He has a normal mood and affect. His behavior is normal.          Assessment & Plan:    Diabetes mellitus. Will add farxiga 1 pill daily. We'll recheck in 2 months. Hypertension stable Prostate cancer CAD stable

## 2013-05-10 NOTE — Progress Notes (Signed)
Pre-visit discussion using our clinic review tool. No additional management support is needed unless otherwise documented below in the visit note.  

## 2013-05-10 NOTE — Patient Instructions (Signed)
Please check your hemoglobin A1c every 3 months   Return in 2 months for followup   

## 2013-05-11 ENCOUNTER — Telehealth: Payer: Self-pay | Admitting: *Deleted

## 2013-05-11 NOTE — Telephone Encounter (Signed)
Message copied by Reesa Chew on Tue May 11, 2013 12:12 PM ------      Message from: Benjiman Core      Created: Tue May 11, 2013  8:36 AM       Please call his PSA. ------

## 2013-05-11 NOTE — Telephone Encounter (Signed)
Called patient with results of last PSA test.

## 2013-05-19 ENCOUNTER — Other Ambulatory Visit: Payer: Self-pay | Admitting: Oncology

## 2013-05-21 ENCOUNTER — Other Ambulatory Visit: Payer: Self-pay | Admitting: *Deleted

## 2013-05-21 MED ORDER — ABIRATERONE ACETATE 250 MG PO TABS: 1000.0000 mg | ORAL_TABLET | Freq: Every day | ORAL | Status: DC

## 2013-05-24 ENCOUNTER — Encounter: Payer: Self-pay | Admitting: Family Medicine

## 2013-05-24 ENCOUNTER — Ambulatory Visit (INDEPENDENT_AMBULATORY_CARE_PROVIDER_SITE_OTHER): Payer: Medicare Other | Admitting: Family Medicine

## 2013-05-24 VITALS — BP 132/72 | HR 54 | Temp 97.7°F | Wt 192.0 lb

## 2013-05-24 DIAGNOSIS — H6981 Other specified disorders of Eustachian tube, right ear: Secondary | ICD-10-CM

## 2013-05-24 DIAGNOSIS — H6990 Unspecified Eustachian tube disorder, unspecified ear: Secondary | ICD-10-CM

## 2013-05-24 DIAGNOSIS — H6980 Other specified disorders of Eustachian tube, unspecified ear: Secondary | ICD-10-CM

## 2013-05-24 NOTE — Progress Notes (Signed)
Subjective:    Patient ID: [name] [name], male    DOB: 06-Jan-1930, 79 y.o.   MRN: [id number]  HPI Right earache.  Sensation of fullness   Onset about 2 weeks ago. No drainage.  No fever. Denies any dizziness or vertigo. No sudden hearing changes. No recent change of altitude such as flying. He denies any major nasal congestive symptoms. He notices sometimes with swallowing that he has change of pressure inside the right ear. No sore throat.  Past Medical History  Diagnosis Date  . Hypertension   . Hyperlipidemia   . Memory loss     mild  . Rectal ulcer     RESOLVED  . Constipation   . History of ventricular fibrillation 1998    while on treadmill  . Hypothyroidism   . IBS (irritable bowel syndrome)   . Colitis     RESOLVED  . ED (erectile dysfunction)   . Osteoporosis   . Depression   . Diabetes mellitus     fasting blood sugar 140-200 usually  . GERD (gastroesophageal reflux disease)   . Heart attack 2009  . Dementia   . Neuromuscular disorder     DIABETIC NEUROPATHY  . Prostate cancer   . Pain     LOWER BACK  -- PAST HX OF STEROID EPIDURALS FOR PAIN RELIEF  . 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 CAD (coronary artery disease)     a. S/P CABG x 5 1998; b.  S/P stent LCX 2009;  c.  LHC 5/09: RCA proximal 90%, 95%, distally occluded, pCFX stents patent, OM1 30%, small left atrial branch 95%, oLAD occluded, S-dRCA patent, L-LAD/Dx patent, EF 60%=> Med Rx  ;  d.  Lex MV 3/13:  EF 63%, no ischemia  e.  Lex Myoview 03/08/13:  EF 56%, inf and apical defect suggestive of thinning and soft tissue attenuation, no ischemia; low risk.     Past Surgical History  Procedure Laterality Date  . Coronary artery bypass graft  1998    x5, with a left internal mammary to the LAD and diagonal, vein graft to the OM1 and 2, and a vein graft to the right  coronary artery  . Coronary stent placement  11/13/2007    LCX  . Trigger finger repaired  2009  . Laparoscopic gastrectomy N/A 08/18/2012    Procedure: Laparoscopic Partial Gastrectomy;  Surgeon: Ardeth Sportsman, MD;  Location: [facility];  Service: General;  Laterality: N/A;  Laparoscopic Partial Gastrectomy,    reports that he has quit smoking. He quit smokeless tobacco use about 65 years ago. He reports that he does not drink alcohol or use illicit drugs. family history includes Pneumonia in his father; Stroke in his father. There is no history of Colon cancer. Allergies  Allergen Reactions  . Codeine Phosphate Other (See Comments)    mouth sores      Review of Systems  Constitutional: Negative for fever and chills.  HENT: Positive for ear pain. Negative for congestion, ear discharge and hearing loss.        Objective:   Physical Exam  Constitutional: He appears well-developed and well-nourished.  HENT:  Eardrums appear normal. Minimal cerumen.  Neck: Neck supple. No thyromegaly present.  Cardiovascular: Normal rate.   Pulmonary/Chest: Effort normal and breath sounds normal. No respiratory distress. He has no wheezes. He has no rales.  Lymphadenopathy:  He has no cervical adenopathy.  Neurological: He is alert.  He has chronic right eyelid droop from birth          Assessment & Plan:  Right eustachian tube dysfunction. Normal exam. We've recommended observation. We explained that medical therapy (eg decongestants, steroids, anti-histamines,etc) is generally not very successful for this. Followup promptly for any dizziness, headaches, or new symptoms

## 2013-05-24 NOTE — Progress Notes (Signed)
Pre visit review using our clinic review tool, if applicable. No additional management support is needed unless otherwise documented below in the visit note. 

## 2013-05-24 NOTE — Patient Instructions (Signed)
Barotitis Media Barotitis media is soreness (inflammation) of the area behind the eardrum (middle ear). This occurs when the auditory tube (Eustachian tube) leading from the back of the throat to the eardrum is blocked. When it is blocked air cannot move in and out of the middle ear to equalize pressure changes. These pressure changes come from changes in altitude when:  Flying.  Driving in the mountains.  Diving. Problems are more likely to occur with pressure changes during times when you are congested as from:  Hay fever.  Upper respiratory infection.  A cold. Damage or hearing loss (barotrauma) caused by this may be permanent. HOME CARE INSTRUCTIONS   Use medicines as recommended by your caregiver. Over the counter medicines will help unblock the canal and can help during times of air travel.  Do not put anything into your ears to clean or unplug them. Eardrops will not be helpful.  Do not swim, dive, or fly until your caregiver says it is all right to do so. If these activities are necessary, chewing gum with frequent swallowing may help. It is also helpful to hold your nose and gently blow to pop your ears for equalizing pressure changes. This forces air into the Eustachian tube.  For little ones with problems, give your baby a bottle of water or juice during periods when pressure changes would be anticipated such as during take offs and landings associated with air travel.  Only take over-the-counter or prescription medicines for pain, discomfort, or fever as directed by your caregiver.  A decongestant may be helpful in de-congesting the middle ear and make pressure equalization easier. This can be even more effective if the drops (spray) are delivered with the head lying over the edge of a bed with the head tilted toward the ear on the affected side.  If your caregiver has given you a follow-up appointment, it is very important to keep that appointment. Not keeping the  appointment could result in a chronic or permanent injury, pain, hearing loss and disability. If there is any problem keeping the appointment, you must call back to this facility for assistance. SEEK IMMEDIATE MEDICAL CARE IF:   You develop a severe headache, dizziness, severe ear pain, or bloody or pus-like drainage from your ears.  An oral temperature above 102 F (38.9 C) develops.  Your problems do not improve or become worse. MAKE SURE YOU:   Understand these instructions.  Will watch your condition.  Will get help right away if you are not doing well or get worse. Document Released: 06/14/2000 Document Revised: 09/09/2011 Document Reviewed: 01/12/2013 [employer] Patient Information 2014 [employer].  

## 2013-06-15 ENCOUNTER — Other Ambulatory Visit: Payer: Self-pay | Admitting: Neurology

## 2013-06-16 ENCOUNTER — Telehealth: Payer: Self-pay | Admitting: Oncology

## 2013-06-16 NOTE — Telephone Encounter (Signed)
Rx faxed to [facility] at [phone]. °

## 2013-06-16 NOTE — Telephone Encounter (Signed)
returned pt call and lvm for pt with r/s appt in Jan 2015.Marland KitchenMarland KitchenMarland Kitchen

## 2013-06-18 ENCOUNTER — Ambulatory Visit: Payer: Medicare Other | Admitting: Oncology

## 2013-06-18 ENCOUNTER — Other Ambulatory Visit: Payer: Medicare Other

## 2013-07-05 ENCOUNTER — Ambulatory Visit (INDEPENDENT_AMBULATORY_CARE_PROVIDER_SITE_OTHER): Payer: Medicare Other | Admitting: Internal Medicine

## 2013-07-05 ENCOUNTER — Encounter: Payer: Self-pay | Admitting: Internal Medicine

## 2013-07-05 VITALS — BP 124/72 | HR 57 | Resp 20 | Ht 67.5 in | Wt 186.0 lb

## 2013-07-05 DIAGNOSIS — E119 Type 2 diabetes mellitus without complications: Secondary | ICD-10-CM

## 2013-07-05 DIAGNOSIS — I2510 Atherosclerotic heart disease of native coronary artery without angina pectoris: Secondary | ICD-10-CM

## 2013-07-05 DIAGNOSIS — I10 Essential (primary) hypertension: Secondary | ICD-10-CM

## 2013-07-05 DIAGNOSIS — E1149 Type 2 diabetes mellitus with other diabetic neurological complication: Secondary | ICD-10-CM

## 2013-07-05 DIAGNOSIS — R413 Other amnesia: Secondary | ICD-10-CM

## 2013-07-05 MED ORDER — TRAMADOL HCL 50 MG PO TABS: 50.0000 mg | ORAL_TABLET | Freq: Three times a day (TID) | ORAL | Status: DC | PRN

## 2013-07-05 MED ORDER — MEMANTINE HCL 28 X 5 MG & 21 X 10 MG PO TABS: ORAL_TABLET | ORAL | Status: DC

## 2013-07-05 MED ORDER — MEMANTINE HCL ER 28 MG PO CP24: 1.0000 | ORAL_CAPSULE | Freq: Every day | ORAL | Status: DC

## 2013-07-05 NOTE — Patient Instructions (Signed)
Limit your sodium (Salt) intake   Please check your hemoglobin A1c every 3 months   

## 2013-07-05 NOTE — Progress Notes (Signed)
Pre-visit discussion using our clinic review tool. No additional management support is needed unless otherwise documented below in the visit note.  

## 2013-07-05 NOTE — Progress Notes (Signed)
Subjective:    Patient ID: [name] [name], male    DOB: 09-13-1929, 80 y.o.   MRN: [id number]  HPI  80 year old patient who is seen today for followup. Complaints include right great toe pain as well as pain involving the coccyx. He has hypertension artery disease and type 2 diabetes. Since his last visit here he was placed on farxiga and has enjoyed much better glycemic control. His follow closely by urology due to metastatic prostate cancer. He has coronary artery disease which has been stable. Denies any cardiopulmonary complaints. His other complaint is progressive memory loss. He has been on Aricept for quite some time and now seems to have more word finding difficulties. He is accompanied by a daughter and son-in-law today  Past Medical History  Diagnosis Date  . Hypertension   . Hyperlipidemia   . Memory loss     mild  . Rectal ulcer     RESOLVED  . Constipation   . History of ventricular fibrillation 1998    while on treadmill  . Hypothyroidism   . IBS (irritable bowel syndrome)   . Colitis     RESOLVED  . ED (erectile dysfunction)   . Osteoporosis   . Depression   . Diabetes mellitus     fasting blood sugar 140-200 usually  . GERD (gastroesophageal reflux disease)   . Heart attack 2009  . Dementia   . Neuromuscular disorder     DIABETIC NEUROPATHY  . Prostate cancer   . Pain     LOWER BACK  -- PAST HX OF STEROID EPIDURALS FOR PAIN RELIEF  . 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 CAD (coronary artery disease)     a. S/P CABG x 5 1998; b.  S/P stent LCX 2009;  c.  LHC 5/09: RCA proximal 90%, 95%, distally occluded, pCFX stents patent, OM1 30%, small left atrial branch 95%, oLAD occluded, S-dRCA patent, L-LAD/Dx patent, EF 60%=> Med Rx  ;  d.  Lex MV 3/13:  EF 63%, no ischemia  e.  Lex Myoview 03/08/13:  EF 56%, inf and apical  defect suggestive of thinning and soft tissue attenuation, no ischemia; low risk.      History   Social History  . Marital Status: Widowed    Spouse Name: N/A    Number of Children: 4  . Years of Education: N/A   Occupational History  . retired    Social History Main Topics  . Smoking status: Former Research scientist (life sciences)  . Smokeless tobacco: Former Systems developer    Quit date: 02/11/1948  . Alcohol Use: No  . Drug Use: No  . Sexual Activity: Not on file   Other Topics Concern  . Not on file   Social History Narrative  . No narrative on file    Past Surgical History  Procedure Laterality Date  . Coronary artery bypass graft  1998    x5, with a left internal mammary to the LAD and diagonal, vein graft to the OM1 and 2, and a vein graft to the right coronary artery  . Coronary stent placement  11/13/2007    LCX  . Trigger finger repaired  2009  . Laparoscopic gastrectomy N/A 08/18/2012    Procedure: Laparoscopic Partial Gastrectomy;  Surgeon: [name], MD;  Location: [facility];  Service: General;  Laterality: N/A;  Laparoscopic Partial Gastrectomy,    Family  History  Problem Relation Age of Onset  . Stroke Father   . Pneumonia Father   . Colon cancer Neg Hx     Allergies  Allergen Reactions  . Codeine Phosphate Other (See Comments)    mouth sores    Current Outpatient Prescriptions on File Prior to Visit  Medication Sig Dispense Refill  . abiraterone Acetate (ZYTIGA) 250 MG tablet Take 4 tablets (1,000 mg total) by mouth daily. Take on an empty stomach 1 hour before or 2 hours after a meal  120 tablet  1  . ACCU-CHEK SOFTCLIX LANCETS lancets 1 each by Other route 2 (two) times daily as needed for other. Use as instructed  100 each  12  . acetaminophen (TYLENOL) 500 MG tablet Take 1,000 mg by mouth every 6 (six) hours as needed for pain.      [name] amLODipine-valsartan (EXFORGE) 10-320 MG per tablet Take 1 tablet by mouth daily.      [name] aspirin EC 81 MG tablet Take 81 mg by mouth every  morning.       [name] atorvastatin (LIPITOR) 10 MG tablet TAKE ONE TABLET BY MOUTH ONCE DAILY  90 tablet  0  . calcium carbonate-magnesium hydroxide (ROLAIDS) 334 MG CHEW Chew 1 tablet by mouth 2 (two) times daily as needed (indigestion).       . Calcium Citrate-Vitamin D (CITRACAL + D PO) Take 1 tablet by mouth daily.      . Dapagliflozin Propanediol 10 MG TABS Take 1 tablet by mouth daily.  30 tablet    . Denosumab (XGEVA [place]) Inject into the skin every 30 (thirty) days. Injects "around the first part of each month."      . docusate sodium (COLACE) 50 MG capsule Take 50-100 mg by mouth 2 (two) times daily.       [name] donepezil (ARICEPT) 10 MG tablet Take 10 mg by mouth daily.      [name] esomeprazole (NEXIUM) 20 MG capsule Take 20 mg by mouth daily as needed (indigestion).      [name] glimepiride (AMARYL) 4 MG tablet Take 1 tablet (4 mg total) by mouth daily before breakfast.  30 tablet  3  . glucose blood (ACCU-CHEK SMARTVIEW) test strip 1 each by Other route 2 (two) times daily as needed for other. Use as instructed  100 each  12  . leuprolide (LUPRON) 3.75 MG injection Inject 3.75 mg into the muscle every 4 (four) months.      . levothyroxine (SYNTHROID, LEVOTHROID) 50 MCG tablet TAKE ONE TABLET BY MOUTH EVERY DAY  90 tablet  0  . LYRICA 50 MG capsule TAKE TWO CAPSULES BY MOUTH IN THE MORNING AND ONE CAP IN THE EVENING  90 capsule  5  . metFORMIN (GLUCOPHAGE) 500 MG tablet Take 500 mg by mouth 2 (two) times daily with a meal.      . Misc Natural Products (OSTEO BI-FLEX ADV DOUBLE ST) TABS Take by mouth.      . nitroGLYCERIN (NITROSTAT) 0.4 MG SL tablet Place 0.4 mg under the tongue every 5 (five) minutes as needed. For chest pain      . Nutritional Supplements (JUICE PLUS FIBRE PO) Take 6 capsules by mouth daily.       [name] omega-3 acid ethyl esters (LOVAZA) 1 G capsule Take 1 g by mouth 2 (two) times daily.      [name] oxybutynin (DITROPAN-XL) 10 MG 24 hr tablet Take 10 mg by mouth daily.       [name]  sitaGLIPtin  (JANUVIA) 50 MG tablet Take 100 mg by mouth every evening.       . triamcinolone ointment (KENALOG) 0.1 %       . [DISCONTINUED] chlorthalidone (HYGROTON) 25 MG tablet Take 12.5 mg by mouth daily.       No current facility-administered medications on file prior to visit.    BP 124/72  Pulse 57  Resp 20  Ht 5' 7.5" (1.715 m)  Wt 186 lb (84.369 kg)  BMI 28.68 kg/m2  SpO2 98%       Review of Systems  Constitutional: Negative for fever, chills, appetite change and fatigue.  HENT: Negative for congestion, dental problem, ear pain, hearing loss, sore throat, tinnitus, trouble swallowing and voice change.   Eyes: Negative for pain, discharge and visual disturbance.  Respiratory: Negative for cough, chest tightness, wheezing and stridor.   Cardiovascular: Negative for chest pain, palpitations and leg swelling.  Gastrointestinal: Negative for nausea, vomiting, abdominal pain, diarrhea, constipation, blood in stool and abdominal distention.  Genitourinary: Negative for urgency, hematuria, flank pain, discharge, difficulty urinating and genital sores.  Musculoskeletal: Positive for arthralgias and back pain. Negative for gait problem, joint swelling, myalgias and neck stiffness.  Skin: Negative for rash.  Neurological: Negative for dizziness, syncope, speech difficulty, weakness, numbness and headaches.  Hematological: Negative for adenopathy. Does not bruise/bleed easily.  Psychiatric/Behavioral: Positive for decreased concentration. Negative for behavioral problems and dysphoric mood. The patient is nervous/anxious.        Objective:   Physical Exam  Constitutional: He is oriented to person, place, and time. He appears well-developed.  HENT:  Head: Normocephalic.  Right Ear: External ear normal.  Left Ear: External ear normal.  Eyes: Conjunctivae and EOM are normal.  Neck: Normal range of motion.  Cardiovascular: Normal rate and normal heart sounds.   Pulmonary/Chest: Breath  sounds normal.  Abdominal: Bowel sounds are normal.  Musculoskeletal: Normal range of motion. He exhibits no edema and no tenderness.  No signs of active arthritis involving his right great toe. Flexion of the toe did tend to reproduce his pain Right knee had a mild effusion and slightly warm to touch  Neurological: He is alert and oriented to person, place, and time.  Psychiatric: He has a normal mood and affect. His behavior is normal.          Assessment & Plan:   Osteoarthritis right knee Probable tendinitis right great toe Diabetes mellitus. We'll check a hemoglobin A1c Cognitive impairment. Patient will consider starting Namenda  Recheck 3 months

## 2013-07-06 ENCOUNTER — Ambulatory Visit: Payer: Medicare Other | Admitting: Oncology

## 2013-07-06 ENCOUNTER — Other Ambulatory Visit: Payer: Medicare Other

## 2013-07-06 LAB — HEMOGLOBIN A1C: HEMOGLOBIN A1C: 6.1 % (ref 4.6–6.5)

## 2013-07-12 ENCOUNTER — Telehealth: Payer: Self-pay | Admitting: Oncology

## 2013-07-12 ENCOUNTER — Ambulatory Visit: Payer: Medicare Other | Admitting: Internal Medicine

## 2013-07-12 NOTE — Telephone Encounter (Signed)
pt called to r/s missed appt...done..pt ok and awre

## 2013-07-15 ENCOUNTER — Telehealth: Payer: Self-pay | Admitting: Internal Medicine

## 2013-07-15 MED ORDER — AMLODIPINE BESYLATE-VALSARTAN 10-320 MG PO TABS: 1.0000 | ORAL_TABLET | Freq: Every day | ORAL | Status: DC

## 2013-07-15 MED ORDER — LEVOTHYROXINE SODIUM 50 MCG PO TABS: ORAL_TABLET | ORAL | Status: DC

## 2013-07-15 NOTE — Telephone Encounter (Signed)
Pt is requesting that you send his prescriptions to a new pharmacy. The [facility] on [address] off of [address]. He also wants a refill on the following medications because he only has enough to last him until next Monday. He needs a refill on amLODipine-valsartan (EXFORGE) 10-320 MG per tablet and levothyroxine (SYNTHROID, LEVOTHROID) 50 MCG tablet. Thanks!!

## 2013-07-16 ENCOUNTER — Other Ambulatory Visit (HOSPITAL_BASED_OUTPATIENT_CLINIC_OR_DEPARTMENT_OTHER): Payer: Medicare Other

## 2013-07-16 ENCOUNTER — Ambulatory Visit (HOSPITAL_BASED_OUTPATIENT_CLINIC_OR_DEPARTMENT_OTHER): Payer: Medicare Other | Admitting: Oncology

## 2013-07-16 ENCOUNTER — Encounter: Payer: Self-pay | Admitting: Oncology

## 2013-07-16 VITALS — BP 172/64 | HR 51 | Temp 97.4°F | Resp 18 | Ht 67.5 in | Wt 180.3 lb

## 2013-07-16 DIAGNOSIS — C61 Malignant neoplasm of prostate: Secondary | ICD-10-CM

## 2013-07-16 DIAGNOSIS — C7952 Secondary malignant neoplasm of bone marrow: Secondary | ICD-10-CM

## 2013-07-16 DIAGNOSIS — E291 Testicular hypofunction: Secondary | ICD-10-CM

## 2013-07-16 DIAGNOSIS — C7951 Secondary malignant neoplasm of bone: Secondary | ICD-10-CM

## 2013-07-16 DIAGNOSIS — R634 Abnormal weight loss: Secondary | ICD-10-CM

## 2013-07-16 DIAGNOSIS — Z8546 Personal history of malignant neoplasm of prostate: Secondary | ICD-10-CM

## 2013-07-16 LAB — COMPREHENSIVE METABOLIC PANEL (CC13)
ALT: 16 U/L (ref 0–55)
ANION GAP: 11 meq/L (ref 3–11)
AST: 20 U/L (ref 5–34)
Albumin: 3.7 g/dL (ref 3.5–5.0)
Alkaline Phosphatase: 58 U/L (ref 40–150)
BILIRUBIN TOTAL: 1.26 mg/dL — AB (ref 0.20–1.20)
BUN: 21.5 mg/dL (ref 7.0–26.0)
CALCIUM: 9.9 mg/dL (ref 8.4–10.4)
CHLORIDE: 107 meq/L (ref 98–109)
CO2: 24 mEq/L (ref 22–29)
Creatinine: 1.5 mg/dL — ABNORMAL HIGH (ref 0.7–1.3)
GLUCOSE: 212 mg/dL — AB (ref 70–140)
Potassium: 3.2 mEq/L — ABNORMAL LOW (ref 3.5–5.1)
Sodium: 143 mEq/L (ref 136–145)
TOTAL PROTEIN: 6.6 g/dL (ref 6.4–8.3)

## 2013-07-16 LAB — CBC WITH DIFFERENTIAL/PLATELET
BASO%: 0.8 % (ref 0.0–2.0)
Basophils Absolute: 0 10*3/uL (ref 0.0–0.1)
EOS ABS: 0.3 10*3/uL (ref 0.0–0.5)
EOS%: 7 % (ref 0.0–7.0)
HEMATOCRIT: 35.4 % — AB (ref 38.4–49.9)
HGB: 12.4 g/dL — ABNORMAL LOW (ref 13.0–17.1)
LYMPH#: 1.2 10*3/uL (ref 0.9–3.3)
LYMPH%: 27.7 % (ref 14.0–49.0)
MCH: 31.1 pg (ref 27.2–33.4)
MCHC: 35 g/dL (ref 32.0–36.0)
MCV: 88.7 fL (ref 79.3–98.0)
MONO#: 0.4 10*3/uL (ref 0.1–0.9)
MONO%: 8.3 % (ref 0.0–14.0)
NEUT#: 2.4 10*3/uL (ref 1.5–6.5)
NEUT%: 56.2 % (ref 39.0–75.0)
PLATELETS: 176 10*3/uL (ref 140–400)
RBC: 3.99 10*6/uL — ABNORMAL LOW (ref 4.20–5.82)
RDW: 14 % (ref 11.0–14.6)
WBC: 4.3 10*3/uL (ref 4.0–10.3)

## 2013-07-16 LAB — PSA: PSA: 0.95 ng/mL (ref <=4.00)

## 2013-07-16 MED ORDER — ABIRATERONE ACETATE 250 MG PO TABS: 1000.0000 mg | ORAL_TABLET | Freq: Every day | ORAL | Status: DC

## 2013-07-16 NOTE — Telephone Encounter (Signed)
appts made and printed...td 

## 2013-07-16 NOTE — Progress Notes (Signed)
Hematology and Oncology Follow Up Visit  [name] [name] [id number] [birth year] 80 y.o. 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 His initial diagnosis was in 2006 when he presented with a PSA of 6.26 the Gleason score 4+3 equals 7.  Prior Therapy:  He is status post definitive therapy with radiation and androgen deprivation completed in 2007. The patient developed recurrent disease in 2012 with a PSA rise up to 28 and T9 osseous metastasis and was treated with combined androgen deprivation with Lupron and Casodex. He had an excellent response with his PSA nadir down to 0.19. Most recently he developed a rise in his PSA up to 3.06 in July 2014 despite castrate levels of testosterone. His bone scan in July 2014 confirmed the presence of bony disease.  Current therapy:  Zytiga 1000 mg daily started in July 2014. He receives Niger monthly at [facility] urology. He receives Lupron every 4 months at [facility] urology.  Interim History:   The patient returns today for a followup visit. He remains on Zytiga without any complications. He is not reporting any new bone pain at this time. He does have ongoing right knee and right shoulder pain due to arthritis. This is unchanged and predates the start of his Zytiga. Denies fatigue. His appetite is good and weight is stable. No lower extremity edema. No abdominal pain, nausea, vomiting. His performance status is unchanged. He reports no hospitalization or illnesses. Has not reported any of the fluid retention or any bone pain. Recently started on Namenda per PCP for memory loss. He has lost 11 lbs since his last visit 2 months ago. States that he is overweight and is trying to lose weight. Reports that his appetite is good. Drinks Boost or Ensure periodically.   Medications: I have reviewed the patient's  current medications.  Current Outpatient Prescriptions  Medication Sig Dispense Refill  . abiraterone Acetate (ZYTIGA) 250 MG tablet Take 4 tablets (1,000 mg total) by mouth daily. Take on an empty stomach 1 hour before or 2 hours after a meal  120 tablet  1  . ACCU-CHEK SOFTCLIX LANCETS lancets 1 each by Other route 2 (two) times daily as needed for other. Use as instructed  100 each  12  . acetaminophen (TYLENOL) 500 MG tablet Take 1,000 mg by mouth every 6 (six) hours as needed for pain.      [name] amLODipine-valsartan (EXFORGE) 10-320 MG per tablet Take 1 tablet by mouth daily.  90 tablet  1  . aspirin EC 81 MG tablet Take 81 mg by mouth every morning.       [name] atorvastatin (LIPITOR) 10 MG tablet TAKE ONE TABLET BY MOUTH ONCE DAILY  90 tablet  0  . calcium carbonate-magnesium hydroxide (ROLAIDS) 334 MG CHEW Chew 1 tablet by mouth 2 (two) times daily as needed (indigestion).       . Calcium Citrate-Vitamin D (CITRACAL + D PO) Take 1 tablet by mouth daily.      . Dapagliflozin Propanediol 10 MG TABS Take 1 tablet by mouth daily.  30 tablet    . Denosumab (XGEVA [name] [name]) Inject into the skin every 30 (thirty) days. Injects "around the first part of each month."      . docusate sodium (COLACE) 50 MG capsule Take 50-100 mg by mouth 2 (two) times daily.       [name] donepezil (ARICEPT) 10 MG  tablet Take 10 mg by mouth daily.      [name] esomeprazole (NEXIUM) 20 MG capsule Take 20 mg by mouth daily as needed (indigestion).      [name] glimepiride (AMARYL) 4 MG tablet Take 1 tablet (4 mg total) by mouth daily before breakfast.  30 tablet  3  . glucose blood (ACCU-CHEK SMARTVIEW) test strip 1 each by Other route 2 (two) times daily as needed for other. Use as instructed  100 each  12  . leuprolide (LUPRON) 3.75 MG injection Inject 3.75 mg into the muscle every 4 (four) months.      . levothyroxine (SYNTHROID, LEVOTHROID) 50 MCG tablet TAKE ONE TABLET BY MOUTH EVERY DAY  90 tablet  1  . LYRICA 50 MG capsule TAKE TWO CAPSULES  BY MOUTH IN THE MORNING AND ONE CAP IN THE EVENING  90 capsule  5  . memantine (NAMENDA TITRATION PAK) tablet pack 5 mg/day for =1 week; 5 mg twice daily for =1 week; 15 mg/day given in 5 mg and 10 mg separated doses for =1 week; then 10 mg twice daily  49 tablet  12  . Memantine HCl ER (NAMENDA XR) 28 MG CP24 Take 28 mg by mouth daily.  30 capsule  6  . metFORMIN (GLUCOPHAGE) 500 MG tablet Take 500 mg by mouth 2 (two) times daily with a meal.      . Misc Natural Products (OSTEO BI-FLEX ADV DOUBLE ST) TABS Take by mouth.      . Nutritional Supplements (JUICE PLUS FIBRE PO) Take 6 capsules by mouth daily.       [name] omega-3 acid ethyl esters (LOVAZA) 1 G capsule Take 1 g by mouth 2 (two) times daily.      [name] oxybutynin (DITROPAN-XL) 10 MG 24 hr tablet Take 10 mg by mouth daily.       . sitaGLIPtin (JANUVIA) 50 MG tablet Take 100 mg by mouth every evening.       . traMADol (ULTRAM) 50 MG tablet Take 1 tablet (50 mg total) by mouth every 8 (eight) hours as needed.  90 tablet  0  . nitroGLYCERIN (NITROSTAT) 0.4 MG SL tablet Place 0.4 mg under the tongue every 5 (five) minutes as needed. For chest pain      . triamcinolone ointment (KENALOG) 0.1 %       . [DISCONTINUED] chlorthalidone (HYGROTON) 25 MG tablet Take 12.5 mg by mouth daily.       No current facility-administered medications for this visit.     Allergies:  Allergies  Allergen Reactions  . Codeine Phosphate Other (See Comments)    mouth sores    Past Medical History, Surgical history, Social history, and Family History were reviewed and updated.  Review of Systems:  Remaining ROS negative.  Physical Exam: Blood pressure 172/64, pulse 51, temperature 97.4 F (36.3 C), temperature source Oral, resp. rate 18, height 5' 7.5" (1.715 m), weight 180 lb 4.8 oz (81.784 kg), SpO2 97.00%. ECOG: 1 General appearance: alert, cooperative and no distress Head: Normocephalic, without obvious abnormality, atraumatic Neck: no adenopathy, no  carotid bruit, no JVD, supple, symmetrical, trachea midline and thyroid not enlarged, symmetric, no tenderness/mass/nodules Lymph nodes: Cervical, supraclavicular, and axillary nodes normal. Heart:regular rate and rhythm, S1, S2 normal, no murmur, click, rub or gallop Lung:chest clear, no wheezing, rales, normal symmetric air entry, no tachypnea, retractions or cyanosis Abdomen: soft, non-tender, without masses or organomegaly EXT:no erythema, induration, or nodules   Lab Results: Lab Results  Component Value  Date   WBC 4.3 07/16/2013   HGB 12.4* 07/16/2013   HCT 35.4* 07/16/2013   MCV 88.7 07/16/2013   PLT 176 07/16/2013     Chemistry      Component Value Date/Time   NA 143 07/16/2013 1113   NA 138 05/07/2013 1522   K 3.2* 07/16/2013 1113   K 3.8 05/07/2013 1522   CL 104 05/07/2013 1522   CO2 24 07/16/2013 1113   CO2 26 05/07/2013 1522   BUN 21.5 07/16/2013 1113   BUN 20 05/07/2013 1522   CREATININE 1.5* 07/16/2013 1113   CREATININE 1.16 05/07/2013 1522   CREATININE 1.38* 02/11/2012 1614      Component Value Date/Time   CALCIUM 9.9 07/16/2013 1113   CALCIUM 9.3 05/07/2013 1522   ALKPHOS 58 07/16/2013 1113   ALKPHOS 54 05/07/2013 1522   AST 20 07/16/2013 1113   AST 22 05/07/2013 1522   ALT 16 07/16/2013 1113   ALT 20 05/07/2013 1522   BILITOT 1.26* 07/16/2013 1113   BILITOT 0.7 05/07/2013 1522     Results for [name] [name], [name] [name] (MRN [id number]) as of 07/16/2013 11:34  Ref. Range 01/18/2011 17:03 01/13/2013 11:26 02/19/2013 09:19 03/22/2013 12:36 05/07/2013 15:11  PSA Latest Range: <=4.00 ng/mL 16.20 (H) 3.07 2.63 1.12 0.74    Impression and Plan: This is an 80 year old gentleman with the following issues: 1. 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 His initial diagnosis dates back to 2006 with PSA of 6.26 and a Gleason score of 7. He is currently on Zytiga since July 2014 is tolerating it well. PSA is down to 0.74 and is pending today. The plan is to continue on Zytiga. We will  monitor his PSA, electrolytes, and liver function monthly.  2. Bony disease: He is receiving Xgeva at [facility] urology on a monthly basis.  3. Hormonal deprivation: He is receiving Lupron at [facility] urology every 4 months.  4. Weight loss: Reports that he is trying to lose weight. Will monitor weight closely. Consider referral to [facility] is weight loss continues.   5. Followup: He will return in 4-5 weeks.   Mikey Bussing 1/16/20151:21 PM

## 2013-07-24 ENCOUNTER — Other Ambulatory Visit: Payer: Self-pay | Admitting: Internal Medicine

## 2013-07-26 ENCOUNTER — Telehealth: Payer: Self-pay | Admitting: Dietician

## 2013-07-26 NOTE — Telephone Encounter (Signed)
Brief Outpatient Oncology Nutrition Note  Patient has been identified to be at risk on malnutrition screen.  Wt Readings from Last 10 Encounters:  07/16/13 180 lb 4.8 oz (81.784 kg)  07/05/13 186 lb (84.369 kg)  05/24/13 192 lb (87.091 kg)  05/10/13 193 lb (87.544 kg)  05/07/13 191 lb 14.4 oz (87.045 kg)  04/15/13 185 lb (83.915 kg)  03/29/13 189 lb (85.73 kg)  03/22/13 186 lb 6.4 oz (84.55 kg)  03/16/13 187 lb (84.823 kg)  03/08/13 184 lb (83.462 kg)    Dx:  Prostate cancer  Called patient due to weight loss.  Patient reports eating well but not always healthy and is happy with the weight loss as he would like to reduce weight to 165-170 lbs.  Discussed importance of maintaining weight and importance of nutritional status.  Encouraged patient to call the [facility] with any nutrition questions or concerns.  Antonieta Iba, RD, LDN

## 2013-08-04 ENCOUNTER — Encounter: Payer: Self-pay | Admitting: Cardiology

## 2013-08-10 ENCOUNTER — Ambulatory Visit (HOSPITAL_BASED_OUTPATIENT_CLINIC_OR_DEPARTMENT_OTHER): Payer: Medicare Other | Admitting: Oncology

## 2013-08-10 ENCOUNTER — Encounter: Payer: Self-pay | Admitting: Oncology

## 2013-08-10 ENCOUNTER — Other Ambulatory Visit: Payer: Self-pay | Admitting: *Deleted

## 2013-08-10 ENCOUNTER — Other Ambulatory Visit (HOSPITAL_BASED_OUTPATIENT_CLINIC_OR_DEPARTMENT_OTHER): Payer: Medicare Other

## 2013-08-10 ENCOUNTER — Telehealth: Payer: Self-pay | Admitting: Oncology

## 2013-08-10 VITALS — BP 140/66 | HR 58 | Temp 97.5°F | Resp 18 | Ht 67.0 in | Wt 182.2 lb

## 2013-08-10 DIAGNOSIS — C7952 Secondary malignant neoplasm of bone marrow: Secondary | ICD-10-CM

## 2013-08-10 DIAGNOSIS — R634 Abnormal weight loss: Secondary | ICD-10-CM

## 2013-08-10 DIAGNOSIS — C61 Malignant neoplasm of prostate: Secondary | ICD-10-CM

## 2013-08-10 DIAGNOSIS — Z8546 Personal history of malignant neoplasm of prostate: Secondary | ICD-10-CM

## 2013-08-10 DIAGNOSIS — M949 Disorder of cartilage, unspecified: Secondary | ICD-10-CM

## 2013-08-10 DIAGNOSIS — C7951 Secondary malignant neoplasm of bone: Secondary | ICD-10-CM

## 2013-08-10 DIAGNOSIS — M899 Disorder of bone, unspecified: Secondary | ICD-10-CM

## 2013-08-10 LAB — COMPREHENSIVE METABOLIC PANEL (CC13)
ALBUMIN: 3.7 g/dL (ref 3.5–5.0)
ALT: 16 U/L (ref 0–55)
ANION GAP: 10 meq/L (ref 3–11)
AST: 18 U/L (ref 5–34)
Alkaline Phosphatase: 62 U/L (ref 40–150)
BUN: 22.6 mg/dL (ref 7.0–26.0)
CALCIUM: 9.5 mg/dL (ref 8.4–10.4)
CHLORIDE: 109 meq/L (ref 98–109)
CO2: 26 mEq/L (ref 22–29)
CREATININE: 1.5 mg/dL — AB (ref 0.7–1.3)
GLUCOSE: 156 mg/dL — AB (ref 70–140)
POTASSIUM: 2.7 meq/L — AB (ref 3.5–5.1)
Sodium: 145 mEq/L (ref 136–145)
Total Bilirubin: 1.24 mg/dL — ABNORMAL HIGH (ref 0.20–1.20)
Total Protein: 6.4 g/dL (ref 6.4–8.3)

## 2013-08-10 LAB — CBC WITH DIFFERENTIAL/PLATELET
BASO%: 0.7 % (ref 0.0–2.0)
Basophils Absolute: 0 10*3/uL (ref 0.0–0.1)
EOS%: 8 % — AB (ref 0.0–7.0)
Eosinophils Absolute: 0.3 10*3/uL (ref 0.0–0.5)
HCT: 34.1 % — ABNORMAL LOW (ref 38.4–49.9)
HGB: 12.1 g/dL — ABNORMAL LOW (ref 13.0–17.1)
LYMPH#: 1.2 10*3/uL (ref 0.9–3.3)
LYMPH%: 29.2 % (ref 14.0–49.0)
MCH: 30.4 pg (ref 27.2–33.4)
MCHC: 35.5 g/dL (ref 32.0–36.0)
MCV: 85.7 fL (ref 79.3–98.0)
MONO#: 0.4 10*3/uL (ref 0.1–0.9)
MONO%: 9.2 % (ref 0.0–14.0)
NEUT#: 2.2 10*3/uL (ref 1.5–6.5)
NEUT%: 52.9 % (ref 39.0–75.0)
Platelets: 152 10*3/uL (ref 140–400)
RBC: 3.98 10*6/uL — AB (ref 4.20–5.82)
RDW: 14.4 % (ref 11.0–14.6)
WBC: 4.1 10*3/uL (ref 4.0–10.3)

## 2013-08-10 LAB — PSA: PSA: 1.15 ng/mL (ref <=4.00)

## 2013-08-10 MED ORDER — POTASSIUM CHLORIDE CRYS ER 20 MEQ PO TBCR: 20.0000 meq | EXTENDED_RELEASE_TABLET | Freq: Two times a day (BID) | ORAL | Status: DC

## 2013-08-10 NOTE — Telephone Encounter (Signed)
Gave pt appt for lab and MD for March  2015 °

## 2013-08-10 NOTE — Progress Notes (Signed)
Spoke with patient, k-dur called to his pharmacy. Take twice daily  X 7 days.

## 2013-08-10 NOTE — Telephone Encounter (Signed)
Per kristin curcio NP k-dur e-scribed to patient's pharmacy. lmoam for patient to call me.

## 2013-08-10 NOTE — Progress Notes (Signed)
Hematology and Oncology Follow Up Visit  [name] [name] [id number] 1930-01-02 80 y.o. 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 His initial diagnosis was in 2006 when he presented with a PSA of 6.26 the Gleason score 4+3 equals 7.  Prior Therapy:  He is status post definitive therapy with radiation and androgen deprivation completed in 2007. The patient developed recurrent disease in 2012 with a PSA rise up to 28 and T9 osseous metastasis and was treated with combined androgen deprivation with Lupron and Casodex. He had an excellent response with his PSA nadir down to 0.19. Most recently he developed a rise in his PSA up to 3.06 in July 2014 despite castrate levels of testosterone. His bone scan in July 2014 confirmed the presence of bony disease.  Current therapy:  Zytiga 1000 mg daily started in July 2014. He receives Niger monthly at [facility] u[facility]. He receives Lupron every 4 months at [facility] urology.  Interim History:   The patient returns today for a followup visit. He remains on Zytiga without any complications. He is not reporting any new bone pain at this time. He does have ongoing right knee and right shoulder pain due to arthritis. This is unchanged and predates the start of his Zytiga. Denies fatigue. His appetite is good and weight is stable. No lower extremity edema. No abdominal pain, nausea, vomiting. His performance status is unchanged. He reports no hospitalization or illnesses. Has not reported any of the fluid retention or any bone pain. Reports that his appetite is good. Drinks Boost or Ensure periodically.   Medications: I have reviewed the patient's current medications.  Current Outpatient Prescriptions  Medication Sig Dispense Refill  . abiraterone Acetate (ZYTIGA) 250 MG tablet Take 4 tablets (1,000 mg total)  by mouth daily. Take on an empty stomach 1 hour before or 2 hours after a meal  120 tablet  1  . ACCU-CHEK SOFTCLIX LANCETS lancets 1 each by Other route 2 (two) times daily as needed for other. Use as instructed  100 each  12  . acetaminophen (TYLENOL) 500 MG tablet Take 1,000 mg by mouth every 6 (six) hours as needed for pain.      [name] amLODipine-valsartan (EXFORGE) 10-320 MG per tablet Take 1 tablet by mouth daily.  90 tablet  1  . aspirin EC 81 MG tablet Take 81 mg by mouth every morning.       [name] atorvastatin (LIPITOR) 10 MG tablet TAKE ONE TABLET BY MOUTH ONCE DAILY  90 tablet  0  . atorvastatin (LIPITOR) 10 MG tablet TAKE 1 TABLET BY MOUTH EVERY DAY  90 tablet  1  . calcium carbonate-magnesium hydroxide (ROLAIDS) 334 MG CHEW Chew 1 tablet by mouth 2 (two) times daily as needed (indigestion).       . Calcium Citrate-Vitamin D (CITRACAL + D PO) Take 1 tablet by mouth daily.      . Dapagliflozin Propanediol 10 MG TABS Take 1 tablet by mouth daily.  30 tablet    . Denosumab (XGEVA [place]) Inject into the skin every 30 (thirty) days. Injects "around the first part of each month."      . docusate sodium (COLACE) 50 MG capsule Take 50-100 mg by mouth 2 (two) times daily.       [name] donepezil (ARICEPT) 10 MG tablet Take 10 mg by mouth daily.      [name]  esomeprazole (NEXIUM) 20 MG capsule Take 20 mg by mouth daily as needed (indigestion).      [name] glimepiride (AMARYL) 4 MG tablet Take 1 tablet (4 mg total) by mouth daily before breakfast.  30 tablet  3  . glucose blood (ACCU-CHEK SMARTVIEW) test strip 1 each by Other route 2 (two) times daily as needed for other. Use as instructed  100 each  12  . leuprolide (LUPRON) 3.75 MG injection Inject 3.75 mg into the muscle every 4 (four) months.      . levothyroxine (SYNTHROID, LEVOTHROID) 50 MCG tablet TAKE ONE TABLET BY MOUTH EVERY DAY  90 tablet  1  . LYRICA 50 MG capsule TAKE TWO CAPSULES BY MOUTH IN THE MORNING AND ONE CAP IN THE EVENING  90 capsule  5  . memantine  (NAMENDA TITRATION PAK) tablet pack 5 mg/day for =1 week; 5 mg twice daily for =1 week; 15 mg/day given in 5 mg and 10 mg separated doses for =1 week; then 10 mg twice daily  49 tablet  12  . Memantine HCl ER (NAMENDA XR) 28 MG CP24 Take 28 mg by mouth daily.  30 capsule  6  . metFORMIN (GLUCOPHAGE) 500 MG tablet Take 500 mg by mouth 2 (two) times daily with a meal.      . Misc Natural Products (OSTEO BI-FLEX ADV DOUBLE ST) TABS Take by mouth.      . nitroGLYCERIN (NITROSTAT) 0.4 MG SL tablet Place 0.4 mg under the tongue every 5 (five) minutes as needed. For chest pain      . Nutritional Supplements (JUICE PLUS FIBRE PO) Take 6 capsules by mouth daily.       [name] omega-3 acid ethyl esters (LOVAZA) 1 G capsule Take 1 g by mouth 2 (two) times daily.      [name] oxybutynin (DITROPAN-XL) 10 MG 24 hr tablet Take 10 mg by mouth daily.       . sitaGLIPtin (JANUVIA) 50 MG tablet Take 100 mg by mouth every evening.       . traMADol (ULTRAM) 50 MG tablet Take 1 tablet (50 mg total) by mouth every 8 (eight) hours as needed.  90 tablet  0  . triamcinolone ointment (KENALOG) 0.1 %       . [DISCONTINUED] chlorthalidone (HYGROTON) 25 MG tablet Take 12.5 mg by mouth daily.       No current facility-administered medications for this visit.     Allergies:  Allergies  Allergen Reactions  . Codeine Phosphate Other (See Comments)    mouth sores    Past Medical History, Surgical history, Social history, and Family History were reviewed and updated.  Review of Systems:  Remaining ROS negative.  Physical Exam: There were no vitals taken for this visit. ECOG: 1 General appearance: alert, cooperative and no distress Head: Normocephalic, without obvious abnormality, atraumatic Neck: no adenopathy, no carotid bruit, no JVD, supple, symmetrical, trachea midline and thyroid not enlarged, symmetric, no tenderness/mass/nodules Lymph nodes: Cervical, supraclavicular, and axillary nodes normal. Heart:regular rate and  rhythm, S1, S2 normal, no murmur, click, rub or gallop Lung:chest clear, no wheezing, rales, normal symmetric air entry, no tachypnea, retractions or cyanosis Abdomen: soft, non-tender, without masses or organomegaly EXT:no erythema, induration, or nodules   Lab Results: Lab Results  Component Value Date   WBC 4.3 07/16/2013   HGB 12.4* 07/16/2013   HCT 35.4* 07/16/2013   MCV 88.7 07/16/2013   PLT 176 07/16/2013     Chemistry  Component Value Date/Time   NA 143 07/16/2013 1113   NA 138 05/07/2013 1522   K 3.2* 07/16/2013 1113   K 3.8 05/07/2013 1522   CL 104 05/07/2013 1522   CO2 24 07/16/2013 1113   CO2 26 05/07/2013 1522   BUN 21.5 07/16/2013 1113   BUN 20 05/07/2013 1522   CREATININE 1.5* 07/16/2013 1113   CREATININE 1.16 05/07/2013 1522   CREATININE 1.38* 02/11/2012 1614      Component Value Date/Time   CALCIUM 9.9 07/16/2013 1113   CALCIUM 9.3 05/07/2013 1522   ALKPHOS 58 07/16/2013 1113   ALKPHOS 54 05/07/2013 1522   AST 20 07/16/2013 1113   AST 22 05/07/2013 1522   ALT 16 07/16/2013 1113   ALT 20 05/07/2013 1522   BILITOT 1.26* 07/16/2013 1113   BILITOT 0.7 05/07/2013 1522     Results for [name] [name], [name] [name] (MRN [id number]) as of 08/10/2013 11:31  Ref. Range 01/13/2013 11:26 02/19/2013 09:19 03/22/2013 12:36 05/07/2013 15:11 07/16/2013 11:13  PSA Latest Range: <=4.00 ng/mL 3.07 2.63 1.12 0.74 0.95    Impression and Plan: This is an 80 year old gentleman with the following issues: 1. 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 His initial diagnosis dates back to 2006 with PSA of 6.26 and a Gleason score of 7. He is currently on Zytiga since July 2014 is tolerating it well. PSA is down to 0.74 and is pending today. The plan is to continue on Zytiga. We will monitor his PSA, electrolytes, and liver function monthly.  2. Bony disease: He is receiving Xgeva at [facility] urology on a monthly basis.  3. Hormonal deprivation: He is receiving Lupron at [facility] urology every 4  months.  4. Weight loss: Reports that he is trying to lose weight. Will monitor weight closely. Consider referral to [facility] is weight loss continues.   5. Followup: He will return in 4-5 weeks.   [place], [place] 2/10/201511:30 AM

## 2013-08-14 ENCOUNTER — Other Ambulatory Visit: Payer: Self-pay | Admitting: Oncology

## 2013-08-16 ENCOUNTER — Encounter: Payer: Self-pay | Admitting: Cardiology

## 2013-08-16 ENCOUNTER — Ambulatory Visit (INDEPENDENT_AMBULATORY_CARE_PROVIDER_SITE_OTHER): Payer: Medicare Other | Admitting: Cardiology

## 2013-08-16 VITALS — BP 165/79 | HR 55 | Ht 67.0 in | Wt 184.4 lb

## 2013-08-16 DIAGNOSIS — I2510 Atherosclerotic heart disease of native coronary artery without angina pectoris: Secondary | ICD-10-CM

## 2013-08-16 NOTE — Progress Notes (Signed)
HPI The patient presents for followup of known coronary disease. He's had previous CABG. He was hospitalized in late August of last year with chest discomfort. It was atypical. Stress perfusion study in September demonstrated an EF of 56% with pain and thought to be low risk.  Since that time he has had no further chest pain. He denies any chest pressure, neck or arm discomfort. He doesn't have any palpitations, presyncope or syncope. He gets around slowly because of balance problems.   Allergies  Allergen Reactions  . Codeine Phosphate Other (See Comments)    mouth sores    Current Outpatient Prescriptions  Medication Sig Dispense Refill  . abiraterone Acetate (ZYTIGA) 250 MG tablet Take 4 tablets (1,000 mg total) by mouth daily. Take on an empty stomach 1 hour before or 2 hours after a meal  120 tablet  1  . ACCU-CHEK SOFTCLIX LANCETS lancets 1 each by Other route 2 (two) times daily as needed for other. Use as instructed  100 each  12  . acetaminophen (TYLENOL) 500 MG tablet Take 1,000 mg by mouth every 6 (six) hours as needed for pain.      [name] amLODipine-valsartan (EXFORGE) 10-320 MG per tablet Take 1 tablet by mouth daily.  90 tablet  1  . aspirin EC 81 MG tablet Take 81 mg by mouth every morning.       [name] atorvastatin (LIPITOR) 10 MG tablet TAKE ONE TABLET BY MOUTH ONCE DAILY  90 tablet  0  . calcium carbonate-magnesium hydroxide (ROLAIDS) 334 MG CHEW Chew 1 tablet by mouth 2 (two) times daily as needed (indigestion).       . Calcium Citrate-Vitamin D (CITRACAL + D PO) Take 1 tablet by mouth daily.      . Dapagliflozin Propanediol 10 MG TABS Take 1 tablet by mouth daily.  30 tablet    . Denosumab (XGEVA [place]) Inject into the skin every 30 (thirty) days. Injects "around the first part of each month."      . docusate sodium (COLACE) 50 MG capsule Take 50-100 mg by mouth 2 (two) times daily.       [name] donepezil (ARICEPT) 10 MG tablet Take 10 mg by mouth daily.      [name] glimepiride (AMARYL) 4  MG tablet Take 1 tablet (4 mg total) by mouth daily before breakfast.  30 tablet  3  . glucose blood (ACCU-CHEK SMARTVIEW) test strip 1 each by Other route 2 (two) times daily as needed for other. Use as instructed  100 each  12  . leuprolide (LUPRON) 3.75 MG injection Inject 3.75 mg into the muscle every 4 (four) months.      . levothyroxine (SYNTHROID, LEVOTHROID) 50 MCG tablet TAKE ONE TABLET BY MOUTH EVERY DAY  90 tablet  1  . LYRICA 50 MG capsule TAKE TWO CAPSULES BY MOUTH IN THE MORNING AND ONE CAP IN THE EVENING  90 capsule  5  . Memantine HCl ER (NAMENDA XR) 28 MG CP24 Take 28 mg by mouth daily.  30 capsule  6  . metFORMIN (GLUCOPHAGE) 500 MG tablet Take 500 mg by mouth 2 (two) times daily with a meal.      . Misc Natural Products (OSTEO BI-FLEX ADV DOUBLE ST) TABS Take by mouth.      . nitroGLYCERIN (NITROSTAT) 0.4 MG SL tablet Place 0.4 mg under the tongue every 5 (five) minutes as needed. For chest pain      . Nutritional Supplements (JUICE PLUS FIBRE  PO) Take 6 capsules by mouth daily.       [name] omega-3 acid ethyl esters (LOVAZA) 1 G capsule Take 1 g by mouth 2 (two) times daily.      [name] oxybutynin (DITROPAN-XL) 10 MG 24 hr tablet Take 10 mg by mouth daily.       . potassium chloride SA (K-DUR,KLOR-CON) 20 MEQ tablet Take 1 tablet (20 mEq total) by mouth 2 (two) times daily.  14 tablet  0  . sitaGLIPtin (JANUVIA) 50 MG tablet Take 100 mg by mouth every evening.       . traMADol (ULTRAM) 50 MG tablet       . [DISCONTINUED] chlorthalidone (HYGROTON) 25 MG tablet Take 12.5 mg by mouth daily.       No current facility-administered medications for this visit.    Past Medical History  Diagnosis Date  . Hypertension   . Hyperlipidemia   . Memory loss     mild  . Rectal ulcer     RESOLVED  . Constipation   . History of ventricular fibrillation 1998    while on treadmill  . Hypothyroidism   . IBS (irritable bowel syndrome)   . Colitis     RESOLVED  . ED (erectile dysfunction)   .  Osteoporosis   . Depression   . Diabetes mellitus     fasting blood sugar 140-200 usually  . GERD (gastroesophageal reflux disease)   . Heart attack 2009  . Dementia   . Neuromuscular disorder     DIABETIC NEUROPATHY  . Prostate cancer   . Pain     LOWER BACK  -- PAST HX OF STEROID EPIDURALS FOR PAIN RELIEF  . 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 CAD (coronary artery disease)     a. S/P CABG x 5 1998; b.  S/P stent LCX 2009;  c.  LHC 5/09: RCA proximal 90%, 95%, distally occluded, pCFX stents patent, OM1 30%, small left atrial branch 95%, oLAD occluded, S-dRCA patent, L-LAD/Dx patent, EF 60%=> Med Rx  ;  d.  Lex MV 3/13:  EF 63%, no ischemia  e.  Lex Myoview 03/08/13:  EF 56%, inf and apical defect suggestive of thinning and soft tissue attenuation, no ischemia; low risk.      Past Surgical History  Procedure Laterality Date  . Coronary artery bypass graft  1998    x5, with a left internal mammary to the LAD and diagonal, vein graft to the OM1 and 2, and a vein graft to the right coronary artery  . Coronary stent placement  11/13/2007    LCX  . Trigger finger repaired  2009  . Laparoscopic gastrectomy N/A 08/18/2012    Procedure: Laparoscopic Partial Gastrectomy;  Surgeon: [name], MD;  Location: [facility];  Service: General;  Laterality: N/A;  Laparoscopic Partial Gastrectomy,    ROS:  Positive for tailbone pain and pain in his joints and toes. Otherwise as stated in the HPI and negative for all other systems.  PHYSICAL EXAM BP 165/79  Pulse 55  Ht 5\' 7"  (1.702 m)  Wt 184 lb 6.4 oz (83.643 kg)  BMI 28.87 kg/m2 GENERAL:  Well appearing HEENT:  Pupils equal round and reactive, fundi not visualized, oral mucosa unremarkable NECK:  No jugular venous distention, waveform within normal limits, carotid upstroke brisk and symmetric, no  bruits, no thyromegaly LYMPHATICS:  No  cervical, inguinal adenopathy LUNGS:  Clear to auscultation bilaterally BACK:  No CVA tenderness CHEST:  Well healed sternotomy scar. HEART:  PMI not displaced or sustained,S1 and S2 within normal limits, no S3, no S4, no clicks, no rubs, no murmurs ABD:  Flat, positive bowel sounds normal in frequency in pitch, no bruits, no rebound, no guarding, no midline pulsatile mass, no hepatomegaly, no splenomegaly EXT:  2 plus pulses throughout, no edema, no cyanosis no clubbing SKIN:  No rashes no nodules NEURO:  Cranial nerves II through XII grossly intact, motor grossly intact throughout PSYCH:  Cognitively intact, oriented to person place and time  EKG:  Sinus rhythm, rate 55, axis within normal limits, right bundle branch block, no acute ST-T wave changes. 08/16/2013  ASSESSMENT AND PLAN  CAD:  The patient has no new sypmtoms.  No further cardiovascular testing is indicated.  We will continue with aggressive risk reduction and meds as listed.  HTN:  His blood pressure is slightly elevated but this is not unusual. He will continue the meds as listed.  HYPERLIPIDEMIA:  I reviewed his lipids from September his LDL was 65 with a total cholesterol of 128. He will continue on the meds as listed.  DIABETES:   He was concerned because there was some bleeding found on his last eye exam. However, his A1c in January was 6.1. He will continue the meds as listed.

## 2013-08-16 NOTE — Patient Instructions (Signed)
Your physician recommends that you continue on your current medications as directed. Please refer to the Current Medication list given to you today.  Your physician wants you to follow-up in: 1 year with Dr. Percival Spanish. You will receive a reminder letter in the mail two months in advance. If you don't receive a letter, please call our office to schedule the follow-up appointment.

## 2013-08-23 NOTE — Progress Notes (Signed)
   Subjective:    Patient ID: [name] [name], male    DOB: 13-Feb-1930, 80 y.o.   MRN: [id number]  HPI    Review of Systems     Objective:   Physical Exam        Assessment & Plan:  Pt not seen

## 2013-09-08 ENCOUNTER — Encounter: Payer: Self-pay | Admitting: Internal Medicine

## 2013-09-13 ENCOUNTER — Telehealth: Payer: Self-pay | Admitting: Internal Medicine

## 2013-09-13 NOTE — Telephone Encounter (Signed)
Pt had been given samples of FARXIGA 5 mg Pt would like more samples, if we don't have. Pt would like rx sent to [facility]

## 2013-09-14 ENCOUNTER — Telehealth: Payer: Self-pay | Admitting: Oncology

## 2013-09-14 ENCOUNTER — Other Ambulatory Visit (HOSPITAL_BASED_OUTPATIENT_CLINIC_OR_DEPARTMENT_OTHER): Payer: Medicare Other

## 2013-09-14 ENCOUNTER — Ambulatory Visit (HOSPITAL_BASED_OUTPATIENT_CLINIC_OR_DEPARTMENT_OTHER): Payer: Medicare Other | Admitting: Oncology

## 2013-09-14 ENCOUNTER — Encounter: Payer: Self-pay | Admitting: Oncology

## 2013-09-14 VITALS — BP 172/66 | HR 56 | Temp 98.2°F | Resp 18 | Ht 67.0 in | Wt 179.8 lb

## 2013-09-14 DIAGNOSIS — C61 Malignant neoplasm of prostate: Secondary | ICD-10-CM

## 2013-09-14 DIAGNOSIS — C7951 Secondary malignant neoplasm of bone: Secondary | ICD-10-CM

## 2013-09-14 DIAGNOSIS — C7952 Secondary malignant neoplasm of bone marrow: Secondary | ICD-10-CM

## 2013-09-14 LAB — CBC WITH DIFFERENTIAL/PLATELET
BASO%: 0.9 % (ref 0.0–2.0)
BASOS ABS: 0 10*3/uL (ref 0.0–0.1)
EOS ABS: 0.2 10*3/uL (ref 0.0–0.5)
EOS%: 5.7 % (ref 0.0–7.0)
HCT: 34.1 % — ABNORMAL LOW (ref 38.4–49.9)
HEMOGLOBIN: 12 g/dL — AB (ref 13.0–17.1)
LYMPH#: 1 10*3/uL (ref 0.9–3.3)
LYMPH%: 25.1 % (ref 14.0–49.0)
MCH: 31.4 pg (ref 27.2–33.4)
MCHC: 35.3 g/dL (ref 32.0–36.0)
MCV: 88.8 fL (ref 79.3–98.0)
MONO#: 0.4 10*3/uL (ref 0.1–0.9)
MONO%: 9.6 % (ref 0.0–14.0)
NEUT%: 58.7 % (ref 39.0–75.0)
NEUTROS ABS: 2.3 10*3/uL (ref 1.5–6.5)
Platelets: 144 10*3/uL (ref 140–400)
RBC: 3.84 10*6/uL — AB (ref 4.20–5.82)
RDW: 14.5 % (ref 11.0–14.6)
WBC: 4 10*3/uL (ref 4.0–10.3)

## 2013-09-14 LAB — COMPREHENSIVE METABOLIC PANEL (CC13)
ALT: 15 U/L (ref 0–55)
AST: 16 U/L (ref 5–34)
Albumin: 3.6 g/dL (ref 3.5–5.0)
Alkaline Phosphatase: 59 U/L (ref 40–150)
Anion Gap: 12 mEq/L — ABNORMAL HIGH (ref 3–11)
BUN: 16.4 mg/dL (ref 7.0–26.0)
CALCIUM: 9.4 mg/dL (ref 8.4–10.4)
CHLORIDE: 107 meq/L (ref 98–109)
CO2: 26 mEq/L (ref 22–29)
Creatinine: 1.3 mg/dL (ref 0.7–1.3)
GLUCOSE: 194 mg/dL — AB (ref 70–140)
Potassium: 3.3 mEq/L — ABNORMAL LOW (ref 3.5–5.1)
SODIUM: 145 meq/L (ref 136–145)
TOTAL PROTEIN: 6.5 g/dL (ref 6.4–8.3)
Total Bilirubin: 1.05 mg/dL (ref 0.20–1.20)

## 2013-09-14 LAB — PSA: PSA: 1.61 ng/mL (ref <=4.00)

## 2013-09-14 NOTE — Progress Notes (Signed)
Hematology and Oncology Follow Up Visit  [name] [name] [id number] 05-27-1930 80 y.o. 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 His initial diagnosis was in 2006 when he presented with a PSA of 6.26 the Gleason score 4+3 equals 7.  Prior Therapy:  He is status post definitive therapy with radiation and androgen deprivation completed in 2007. The patient developed recurrent disease in 2012 with a PSA rise up to 28 and T9 osseous metastasis and was treated with combined androgen deprivation with Lupron and Casodex. He had an excellent response with his PSA nadir down to 0.19. Most recently he developed a rise in his PSA up to 3.06 in July 2014 despite castrate levels of testosterone. His bone scan in July 2014 confirmed the presence of bony disease.  Current therapy:  Zytiga 1000 mg daily started in July 2014. He receives [place] monthly at [facility] urology. He receives Lupron every 4 months at [facility] urology.  Interim History:   [name] [name] returns today for a followup visit. He remains on Zytiga since July of 2014 without any complications. He is not reporting any new bone pain at this time. He does have ongoing right knee and right shoulder pain due to arthritis. This is unchanged and predates the start of his Zytiga. Denies fatigue. His appetite is good and weight is stable. No lower extremity edema. No abdominal pain, nausea, vomiting. His performance status is unchanged. He reports no hospitalization or illnesses. Has not reported any of the fluid retention or any bone pain. Reports that his appetite is good. He does report occasional constipation but no diarrhea.  Medications: I have reviewed the patient's current medications.  Current Outpatient Prescriptions  Medication Sig Dispense Refill  . abiraterone Acetate (ZYTIGA) 250 MG  tablet Take 4 tablets (1,000 mg total) by mouth daily. Take on an empty stomach 1 hour before or 2 hours after a meal  120 tablet  1  . ACCU-CHEK SOFTCLIX LANCETS lancets 1 each by Other route 2 (two) times daily as needed for other. Use as instructed  100 each  12  . acetaminophen (TYLENOL) 500 MG tablet Take 1,000 mg by mouth every 6 (six) hours as needed for pain.      [name] amLODipine-valsartan (EXFORGE) 10-320 MG per tablet Take 1 tablet by mouth daily.  90 tablet  1  . aspirin EC 81 MG tablet Take 81 mg by mouth every morning.       [name] atorvastatin (LIPITOR) 10 MG tablet TAKE ONE TABLET BY MOUTH ONCE DAILY  90 tablet  0  . calcium carbonate-magnesium hydroxide (ROLAIDS) 334 MG CHEW Chew 1 tablet by mouth 2 (two) times daily as needed (indigestion).       . Calcium Citrate-Vitamin D (CITRACAL + D PO) Take 1 tablet by mouth daily.      . Dapagliflozin Propanediol 10 MG TABS Take 1 tablet by mouth daily.  30 tablet    . Denosumab (XGEVA [place]) Inject into the skin every 30 (thirty) days. Injects "around the first part of each month."      . docusate sodium (COLACE) 50 MG capsule Take 50-100 mg by mouth 2 (two) times daily.       [name] donepezil (ARICEPT) 10 MG tablet Take 10 mg by mouth daily.      [name] glimepiride (AMARYL) 4 MG tablet Take 1 tablet (4 mg total) by mouth  daily before breakfast.  30 tablet  3  . glucose blood (ACCU-CHEK SMARTVIEW) test strip 1 each by Other route 2 (two) times daily as needed for other. Use as instructed  100 each  12  . leuprolide (LUPRON) 3.75 MG injection Inject 3.75 mg into the muscle every 4 (four) months.      . levothyroxine (SYNTHROID, LEVOTHROID) 50 MCG tablet TAKE ONE TABLET BY MOUTH EVERY DAY  90 tablet  1  . LYRICA 50 MG capsule TAKE TWO CAPSULES BY MOUTH IN THE MORNING AND ONE CAP IN THE EVENING  90 capsule  5  . Memantine HCl ER (NAMENDA XR) 28 MG CP24 Take 28 mg by mouth daily.  30 capsule  6  . metFORMIN (GLUCOPHAGE) 500 MG tablet Take 500 mg by mouth 2 (two)  times daily with a meal.      . Misc Natural Products (OSTEO BI-FLEX ADV DOUBLE ST) TABS Take by mouth.      . nitroGLYCERIN (NITROSTAT) 0.4 MG SL tablet Place 0.4 mg under the tongue every 5 (five) minutes as needed. For chest pain      . Nutritional Supplements (JUICE PLUS FIBRE PO) Take 6 capsules by mouth daily.       [name] omega-3 acid ethyl esters (LOVAZA) 1 G capsule Take 1 g by mouth 2 (two) times daily.      [name] oxybutynin (DITROPAN-XL) 10 MG 24 hr tablet Take 10 mg by mouth daily.       . potassium chloride SA (K-DUR,KLOR-CON) 20 MEQ tablet Take 1 tablet (20 mEq total) by mouth 2 (two) times daily.  14 tablet  0  . sitaGLIPtin (JANUVIA) 50 MG tablet Take 100 mg by mouth every evening.       . traMADol (ULTRAM) 50 MG tablet       . [DISCONTINUED] chlorthalidone (HYGROTON) 25 MG tablet Take 12.5 mg by mouth daily.       No current facility-administered medications for this visit.     Allergies:  Allergies  Allergen Reactions  . Codeine Phosphate Other (See Comments)    mouth sores    Past Medical History, Surgical history, Social history, and Family History were reviewed and updated.  Review of Systems: He did not put any headaches blurred vision double vision change in his alteration of mental status psychiatric issues or depression. Does not report any weight loss appetite changes or constitutional symptoms. Does not report any fevers or chills or sweats. As I put any chest pain shortness of breath cough hemoptysis hematemesis. Report any abdominal pain or distention. Stop any frequency urgency or hematuria. No bleeding or thrombosis issues Remaining ROS negative.  Physical Exam: Blood pressure 172/66, pulse 56, temperature 98.2 F (36.8 C), temperature source Oral, resp. rate 18, height 5\' 7"  (1.702 m), weight 179 lb 12.8 oz (81.557 kg), SpO2 100.00%. ECOG: 1 General appearance: alert, cooperative and no distress Head: Normocephalic, without obvious abnormality,  atraumatic Neck: no adenopathy, no carotid bruit, no JVD, supple, symmetrical, trachea midline and thyroid not enlarged, symmetric, no tenderness/mass/nodules Lymph nodes: Cervical, supraclavicular, and axillary nodes normal. Heart:regular rate and rhythm, S1, S2 normal, no murmur, click, rub or gallop Lung:chest clear, no wheezing, rales, normal symmetric air entry, no tachypnea, retractions or cyanosis Abdomen: soft, non-tender, without masses or organomegaly EXT:no erythema, induration, or nodules   Lab Results: Lab Results  Component Value Date   WBC 4.0 09/14/2013   HGB 12.0* 09/14/2013   HCT 34.1* 09/14/2013   MCV 88.8 09/14/2013  PLT 144 09/14/2013     Chemistry      Component Value Date/Time   NA 145 08/10/2013 1120   NA 138 05/07/2013 1522   K 2.7* 08/10/2013 1120   K 3.8 05/07/2013 1522   CL 104 05/07/2013 1522   CO2 26 08/10/2013 1120   CO2 26 05/07/2013 1522   BUN 22.6 08/10/2013 1120   BUN 20 05/07/2013 1522   CREATININE 1.5* 08/10/2013 1120   CREATININE 1.16 05/07/2013 1522   CREATININE 1.38* 02/11/2012 1614      Component Value Date/Time   CALCIUM 9.5 08/10/2013 1120   CALCIUM 9.3 05/07/2013 1522   ALKPHOS 62 08/10/2013 1120   ALKPHOS 54 05/07/2013 1522   AST 18 08/10/2013 1120   AST 22 05/07/2013 1522   ALT 16 08/10/2013 1120   ALT 20 05/07/2013 1522   BILITOT 1.24* 08/10/2013 1120   BILITOT 0.7 05/07/2013 1522      Results for [name] [name], [name] [name] (MRN [id number]) as of 09/14/2013 12:02  Ref. Range 07/16/2013 11:13 08/10/2013 11:20  PSA Latest Range: <=4.00 ng/mL 0.95 1.15    Impression and Plan: This is an 80 year old gentleman with the following issues: 1. 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 His initial diagnosis dates back to 2006 with PSA of 6.26 and a Gleason score of 7. He is currently on Zytiga since July 2014 is tolerating it well. PSA is down to 0.74 and now up to 1.15 and is pending today. The plan is to continue on Zytiga. We will monitor his  PSA, electrolytes, and liver function monthly. My plan is to continue him on Zytiga despite the slow rise in PSA for the time being. If he develops a rapid rise in his PSA with a rapid doubling time we will consider switch him to a different agent  2. Bony disease: He is receiving Xgeva at [facility] urology on a monthly basis.  3. Hormonal deprivation: He is receiving Lupron at [facility] urology every 4 months.  4. Weight loss: Reports that he is trying to lose weight. Will monitor weight closely. Consider referral to [facility] is weight loss continues.   5. Followup: He will return in 4-5 weeks.   [facility] 3/17/201512:20 PM

## 2013-09-14 NOTE — Telephone Encounter (Signed)
gv adn printed appt sched and avs for pt for April  °

## 2013-09-16 NOTE — Telephone Encounter (Signed)
Pt also would like a new dm meter

## 2013-09-17 MED ORDER — DAPAGLIFLOZIN PROPANEDIOL 10 MG PO TABS: 1.0000 | ORAL_TABLET | Freq: Every day | ORAL | Status: DC

## 2013-09-17 MED ORDER — GLUCOSE BLOOD VI STRP: 1.0000 | ORAL_STRIP | Freq: Two times a day (BID) | Status: DC | PRN

## 2013-09-17 MED ORDER — ACCU-CHEK SOFTCLIX LANCETS MISC: 1.0000 | Freq: Two times a day (BID) | Status: DC | PRN

## 2013-09-17 NOTE — Telephone Encounter (Signed)
Spoke to pt told him I have 7 tablets only of farxiga 10 mg tablets and a discount card for the prescription. Pt verbalized understanding. Asked pt what meter does he need? Pt said they do not make strips for his Accu-chek Smartview. Told him need to check with insurance to see which meter is covered and then I can send new orders. Pt verbalized understanding and will call back.

## 2013-09-17 NOTE — Telephone Encounter (Signed)
Pt stopped by office to pickup sample and said checked with insurance and Accu-chek Aviva Plus meter is fine. Told pt okay sent Rx for medication and will send Rx's for test strips and lancets, new meter given to pt. Pt verbalized understanding.

## 2013-09-20 ENCOUNTER — Other Ambulatory Visit: Payer: Self-pay | Admitting: Oncology

## 2013-09-27 ENCOUNTER — Telehealth: Payer: Self-pay | Admitting: Internal Medicine

## 2013-09-27 MED ORDER — ACCU-CHEK SOFTCLIX LANCETS MISC: 1.0000 | Freq: Two times a day (BID) | Status: DC | PRN

## 2013-09-27 NOTE — Telephone Encounter (Signed)
Pt notified Rx for lancets sent to pharmacy.

## 2013-09-27 NOTE — Telephone Encounter (Signed)
Pt needs ACCU-CHEK SOFTCLIX LANCETS lancets sent to [facility] on [address]  [facility] is not contracted with Medicare for the supplies.

## 2013-09-28 ENCOUNTER — Other Ambulatory Visit: Payer: Self-pay | Admitting: *Deleted

## 2013-09-28 ENCOUNTER — Other Ambulatory Visit (INDEPENDENT_AMBULATORY_CARE_PROVIDER_SITE_OTHER): Payer: Medicare Other

## 2013-09-28 DIAGNOSIS — E1165 Type 2 diabetes mellitus with hyperglycemia: Principal | ICD-10-CM

## 2013-09-28 DIAGNOSIS — IMO0001 Reserved for inherently not codable concepts without codable children: Secondary | ICD-10-CM

## 2013-09-28 MED ORDER — GLUCOSE BLOOD VI STRP: 1.0000 | ORAL_STRIP | Freq: Two times a day (BID) | Status: DC | PRN

## 2013-09-29 LAB — HEMOGLOBIN A1C: Hgb A1c MFr Bld: 5.7 % (ref 4.6–6.5)

## 2013-10-04 ENCOUNTER — Ambulatory Visit (INDEPENDENT_AMBULATORY_CARE_PROVIDER_SITE_OTHER): Payer: Medicare Other | Admitting: Internal Medicine

## 2013-10-04 ENCOUNTER — Encounter: Payer: Self-pay | Admitting: Internal Medicine

## 2013-10-04 VITALS — BP 162/70 | HR 67 | Temp 98.1°F | Resp 20 | Ht 67.0 in | Wt 179.0 lb

## 2013-10-04 DIAGNOSIS — I10 Essential (primary) hypertension: Secondary | ICD-10-CM

## 2013-10-04 DIAGNOSIS — K589 Irritable bowel syndrome without diarrhea: Secondary | ICD-10-CM

## 2013-10-04 DIAGNOSIS — E119 Type 2 diabetes mellitus without complications: Secondary | ICD-10-CM

## 2013-10-04 DIAGNOSIS — M1990 Unspecified osteoarthritis, unspecified site: Secondary | ICD-10-CM

## 2013-10-04 DIAGNOSIS — E1149 Type 2 diabetes mellitus with other diabetic neurological complication: Secondary | ICD-10-CM

## 2013-10-04 MED ORDER — GLIMEPIRIDE 4 MG PO TABS: 2.0000 mg | ORAL_TABLET | Freq: Every day | ORAL | Status: DC

## 2013-10-04 NOTE — Progress Notes (Signed)
Subjective:    Patient ID: [name] [name], male    DOB: 1929-09-05, 80 y.o.   MRN: [id number]  HPI  80 year old patient who is seen today for followup.  He has a history of diabetes, complicated by peripheral neuropathy.  Complaints today include unsteady gait.  He denies any dizziness.  He also complains of right shoulder and right knee pain.  He also is having some foot pain. His diabetes has done much better and recent hemoglobin A1c was less than 6. Denies any cardiac symptoms.  He is followed closely by urology due to metastatic prostate cancer.  He has coronary artery disease, which has been stable.  No ischemic symptoms.  Past Medical History  Diagnosis Date  . Hypertension   . Hyperlipidemia   . Memory loss     mild  . Rectal ulcer     RESOLVED  . Constipation   . History of ventricular fibrillation 1998    while on treadmill  . Hypothyroidism   . IBS (irritable bowel syndrome)   . Colitis     RESOLVED  . ED (erectile dysfunction)   . Osteoporosis   . Depression   . Diabetes mellitus     fasting blood sugar 140-200 usually  . GERD (gastroesophageal reflux disease)   . Heart attack 2009  . Dementia   . Neuromuscular disorder     DIABETIC NEUROPATHY  . Prostate cancer   . Pain     LOWER BACK  -- PAST HX OF STEROID EPIDURALS FOR PAIN RELIEF  . 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 CAD (coronary artery disease)     a. S/P CABG x 5 1998; b.  S/P stent LCX 2009;  c.  LHC 5/09: RCA proximal 90%, 95%, distally occluded, pCFX stents patent, OM1 30%, small left atrial branch 95%, oLAD occluded, S-dRCA patent, L-LAD/Dx patent, EF 60%=> Med Rx  ;  d.  Lex MV 3/13:  EF 63%, no ischemia  e.  Lex Myoview 03/08/13:  EF 56%, inf and apical defect suggestive of thinning and soft tissue attenuation, no ischemia; low risk.      History   Social  History  . Marital Status: Widowed    Spouse Name: N/A    Number of Children: 4  . Years of Education: N/A   Occupational History  . retired    Social History Main Topics  . Smoking status: Former Research scientist (life sciences)  . Smokeless tobacco: Former Systems developer    Quit date: 02/11/1948  . Alcohol Use: No  . Drug Use: No  . Sexual Activity: Not on file   Other Topics Concern  . Not on file   Social History Narrative  . No narrative on file    Past Surgical History  Procedure Laterality Date  . Coronary artery bypass graft  1998    x5, with a left internal mammary to the LAD and diagonal, vein graft to the OM1 and 2, and a vein graft to the right coronary artery  . Coronary stent placement  11/13/2007    LCX  . Trigger finger repaired  2009  . Laparoscopic gastrectomy N/A 08/18/2012    Procedure: Laparoscopic Partial Gastrectomy;  Surgeon: [name], MD;  Location: [facility];  Service: General;  Laterality: N/A;  Laparoscopic Partial Gastrectomy,    Family History  Problem Relation Age of Onset  . Stroke Father   .  Pneumonia Father   . Colon cancer Neg Hx     Allergies  Allergen Reactions  . Codeine Phosphate Other (See Comments)    mouth sores    Current Outpatient Prescriptions on File Prior to Visit  Medication Sig Dispense Refill  . ACCU-CHEK SOFTCLIX LANCETS lancets 1 each by Other route 2 (two) times daily as needed for other.  100 each  12  . acetaminophen (TYLENOL) 500 MG tablet Take 1,000 mg by mouth every 6 (six) hours as needed for pain.      [name] amLODipine-valsartan (EXFORGE) 10-320 MG per tablet Take 1 tablet by mouth daily.  90 tablet  1  . aspirin EC 81 MG tablet Take 81 mg by mouth every morning.       [name] atorvastatin (LIPITOR) 10 MG tablet TAKE ONE TABLET BY MOUTH ONCE DAILY  90 tablet  0  . calcium carbonate-magnesium hydroxide (ROLAIDS) 334 MG CHEW Chew 1 tablet by mouth 2 (two) times daily as needed (indigestion).       . Calcium Citrate-Vitamin D (CITRACAL + D PO)  Take 1 tablet by mouth daily.      . Dapagliflozin Propanediol 10 MG TABS Take 1 tablet by mouth daily.  30 tablet  2  . Denosumab (XGEVA [place]) Inject into the skin every 30 (thirty) days. Injects "around the first part of each month."      . docusate sodium (COLACE) 50 MG capsule Take 50-100 mg by mouth 2 (two) times daily.       [name] donepezil (ARICEPT) 10 MG tablet Take 10 mg by mouth daily.      [name] glimepiride (AMARYL) 4 MG tablet Take 1 tablet (4 mg total) by mouth daily before breakfast.  30 tablet  3  . glucose blood (ACCU-CHEK AVIVA PLUS) test strip 1 each by Other route 2 (two) times daily as needed for other.  100 each  12  . leuprolide (LUPRON) 3.75 MG injection Inject 3.75 mg into the muscle every 4 (four) months.      . levothyroxine (SYNTHROID, LEVOTHROID) 50 MCG tablet TAKE ONE TABLET BY MOUTH EVERY DAY  90 tablet  1  . LYRICA 50 MG capsule TAKE TWO CAPSULES BY MOUTH IN THE MORNING AND ONE CAP IN THE EVENING  90 capsule  5  . Memantine HCl ER (NAMENDA XR) 28 MG CP24 Take 28 mg by mouth daily.  30 capsule  6  . metFORMIN (GLUCOPHAGE) 500 MG tablet Take 500 mg by mouth 2 (two) times daily with a meal.      . Misc Natural Products (OSTEO BI-FLEX ADV DOUBLE ST) TABS Take by mouth.      . nitroGLYCERIN (NITROSTAT) 0.4 MG SL tablet Place 0.4 mg under the tongue every 5 (five) minutes as needed. For chest pain      . Nutritional Supplements (JUICE PLUS FIBRE PO) Take 6 capsules by mouth daily.       [name] omega-3 acid ethyl esters (LOVAZA) 1 G capsule Take 1 g by mouth 2 (two) times daily.      [name] oxybutynin (DITROPAN-XL) 10 MG 24 hr tablet Take 10 mg by mouth daily.       . potassium chloride SA (K-DUR,KLOR-CON) 20 MEQ tablet Take 1 tablet (20 mEq total) by mouth 2 (two) times daily.  14 tablet  0  . sitaGLIPtin (JANUVIA) 50 MG tablet Take 100 mg by mouth every evening.       . traMADol (ULTRAM) 50 MG tablet       .  ZYTIGA 250 MG tablet TAKE 4 TABLETS BY MOUTH DAILY. TAKE ON AN EMPTY STOMACH 1 HOUR  BEFORE OR 2 HOURS AFTER A MEAL  120 tablet  1  . [DISCONTINUED] chlorthalidone (HYGROTON) 25 MG tablet Take 12.5 mg by mouth daily.       No current facility-administered medications on file prior to visit.    BP 162/70  Pulse 67  Temp(Src) 98.1 F (36.7 C) (Oral)  Resp 20  Ht 5\' 7"  (1.702 m)  Wt 179 lb (81.194 kg)  BMI 28.03 kg/m2  SpO2 97%       Review of Systems  Constitutional: Negative for fever, chills, appetite change and fatigue.  HENT: Negative for congestion, dental problem, ear pain, hearing loss, sore throat, tinnitus, trouble swallowing and voice change.   Eyes: Negative for pain, discharge and visual disturbance.  Respiratory: Negative for cough, chest tightness, wheezing and stridor.   Cardiovascular: Negative for chest pain, palpitations and leg swelling.  Gastrointestinal: Negative for nausea, vomiting, abdominal pain, diarrhea, constipation, blood in stool and abdominal distention.  Genitourinary: Negative for urgency, hematuria, flank pain, discharge, difficulty urinating and genital sores.  Musculoskeletal: Positive for arthralgias, gait problem and myalgias. Negative for back pain, joint swelling and neck stiffness.  Skin: Negative for rash.  Neurological: Negative for dizziness, syncope, speech difficulty, weakness, numbness and headaches.  Hematological: Negative for adenopathy. Does not bruise/bleed easily.  Psychiatric/Behavioral: Negative for behavioral problems and dysphoric mood. The patient is not nervous/anxious.        Objective:   Physical Exam  Constitutional: He is oriented to person, place, and time. He appears well-developed.  HENT:  Head: Normocephalic.  Right Ear: External ear normal.  Left Ear: External ear normal.  Eyes: Conjunctivae and EOM are normal.  Neck: Normal range of motion.  Cardiovascular: Normal rate and normal heart sounds.   Pulmonary/Chest: Breath sounds normal.  Abdominal: Bowel sounds are normal.    Musculoskeletal: Normal range of motion. He exhibits no edema and no tenderness.  Neurological: He is alert and oriented to person, place, and time.  Walks with a limp due to right knee pain.  Gait somewhat unsteady  Psychiatric: He has a normal mood and affect. His behavior is normal.          Assessment & Plan:   Diabetes mellitus.  Well controlled at present.  We'll decreaseamaryl to 1 half tablet daily Gait instability.  Multi-factorial Hypertension Coronary artery disease  Followup orthopedics and oncology Recheck here in 3 months Followup orthopedics

## 2013-10-04 NOTE — Progress Notes (Signed)
Pre-visit discussion using our clinic review tool. No additional management support is needed unless otherwise documented below in the visit note.  

## 2013-10-04 NOTE — Patient Instructions (Signed)
Limit your sodium (Salt) intake   Please check your hemoglobin A1c every 3 months  Return in 3 months for follow-up   

## 2013-10-05 ENCOUNTER — Telehealth: Payer: Self-pay | Admitting: Internal Medicine

## 2013-10-05 NOTE — Telephone Encounter (Signed)
Relevant patient education assigned to patient using Emmi. ° °

## 2013-10-07 ENCOUNTER — Other Ambulatory Visit: Payer: Self-pay

## 2013-10-12 ENCOUNTER — Other Ambulatory Visit: Payer: Self-pay | Admitting: Urology

## 2013-10-12 DIAGNOSIS — C61 Malignant neoplasm of prostate: Secondary | ICD-10-CM

## 2013-10-14 ENCOUNTER — Ambulatory Visit (INDEPENDENT_AMBULATORY_CARE_PROVIDER_SITE_OTHER): Payer: BC Managed Care – PPO | Admitting: Neurology

## 2013-10-14 ENCOUNTER — Encounter: Payer: Self-pay | Admitting: Neurology

## 2013-10-14 VITALS — BP 175/85 | HR 47 | Temp 97.0°F | Ht 67.0 in | Wt 180.0 lb

## 2013-10-14 DIAGNOSIS — I259 Chronic ischemic heart disease, unspecified: Secondary | ICD-10-CM

## 2013-10-14 DIAGNOSIS — R413 Other amnesia: Secondary | ICD-10-CM

## 2013-10-14 DIAGNOSIS — C61 Malignant neoplasm of prostate: Secondary | ICD-10-CM

## 2013-10-14 NOTE — Progress Notes (Signed)
Subjective:    Patient ID: [name] [name] is a 80 y.o. male.  HPI    Interim history:   [name] is a 80 year old right-handed gentleman, with an underlying complex medical history of hypertension, hyperlipidemia, heart disease, status post 3 vessel CABG in 1998, stent placement in 2009, left heart catheter in 2009, history of V. fib, hypothyroidism, IBS, ED, osteoporosis, depression, diabetes with neuropathy, reflux disease, and prostate cancer (radiation treatment in 2005 with recurrence), who presents for followup consultation of his memory loss and neuropathy. He is unaccompanied today.  I first met him on 04/15/2013, at which time I felt that his exam was in keeping with mild cognitive impairment or mild vascular dementia without behavioral disturbance and I felt that he was clinically stable. His MMSE scores, physical exam, clock drawing and animal fluency scores were stable and I suggested a routine six-month followup and no changes in his medications.  Today, he feels, that he feels for the most part stable. He lives alone, his wife died 2 years ago. He has 4 children, 3 are local and he gets to see them weekly. He has been driving. He has not fallen recently. He has trouble recalling names and dates. He is on [place] and Lupron for his prostate cancer. He has a bone scan pending.   He previously followed with Dr. Morene Antu and was last seen by him on 09/10/2012, at which time Dr. Erling Cruz felt he was stable and he did not make any changes to his medication regimen. He has a complex underlying medical history as above. He has received Lupron therapy for his prostate cancer, as well as radiation therapy and his gastric tumor was diagnosed by biopsy in August 2012, s/p surgery in 3/14. He is on medications for his cancer. He broke his R arm at age 59 and has loss of ROM d/t that.  He has had MCI, and was initially evaluated on 02/24/1997 at the request of Dr. Romeo Apple for memory loss,  which began after surgery with coronary artery bypass on 12/02/1996. Evaluation included B12 level, RPR, neuropsychological battery, and MRI study study of the brain 02/26/1997. Doppler study of the carotids showed 50-69% right ECA stenosis. He has short-term memory loss which has been stable. He has been on Aricept for over 15 years. He worked as a Chief Executive Officer. He drives a car and is independent in ADLs. He has had daytime sleepiness. In January 2012 he developed severe rectal pain which progressed to go into his left leg posteriorly to behind the knee and into the left foot. It can go into his scrotum and into his penis. MRI of the lumbar spine without contrast on 04/09/10, 09/07/10, and 01/07/11 showed mild DJD. MRI of the pelvis without contrast on 05/07/10 showed rectal wall thickening and no pelvic adenopathy. There was mild diffuse bladder wall thickness. 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 Bone scan 02/07/2011 showed possible metastatic process at T9. He has had bowel and bladder accidents. On 03/08/11, his MMSE was 29/30. He has left foot numbness on the bottom of his foot. His pain is 0-8/10. He has discontinued a fentanyl patch and Nucynta. In 08/2011 he developed pain in his right side including the buttocks extending to the bottom of his right foot with numbness. He had an epidural injection at L5-S1 for his severe pain with improvement  in symptoms. His pain is made worse by bending and stooping. MRI of the lumbar spine on 10/05/2011 showed a focal disc protrusion at L5-S1 to the right of midline and small sclerotic metastasis to the the posterior aspect of the left iliac bone.   His Past Medical History Is Significant For: Past Medical History  Diagnosis Date  . Hypertension   . Hyperlipidemia   . Memory loss     mild  . Rectal ulcer     RESOLVED  . Constipation   . History of ventricular  fibrillation 1998    while on treadmill  . Hypothyroidism   . IBS (irritable bowel syndrome)   . Colitis     RESOLVED  . ED (erectile dysfunction)   . Osteoporosis   . Depression   . Diabetes mellitus     fasting blood sugar 140-200 usually  . GERD (gastroesophageal reflux disease)   . Heart attack 2009  . Dementia   . Neuromuscular disorder     DIABETIC NEUROPATHY  . Prostate cancer   . Pain     LOWER BACK  -- PAST HX OF STEROID EPIDURALS FOR PAIN RELIEF  . 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 CAD (coronary artery disease)     a. S/P CABG x 5 1998; b.  S/P stent LCX 2009;  c.  LHC 5/09: RCA proximal 90%, 95%, distally occluded, pCFX stents patent, OM1 30%, small left atrial branch 95%, oLAD occluded, S-dRCA patent, L-LAD/Dx patent, EF 60%=> Med Rx  ;  d.  Lex MV 3/13:  EF 63%, no ischemia  e.  Lex Myoview 03/08/13:  EF 56%, inf and apical defect suggestive of thinning and soft tissue attenuation, no ischemia; low risk.      His Past Surgical History Is Significant For: Past Surgical History  Procedure Laterality Date  . Coronary artery bypass graft  1998    x5, with a left internal mammary to the LAD and diagonal, vein graft to the OM1 and 2, and a vein graft to the right coronary artery  . Coronary stent placement  11/13/2007    LCX  . Trigger finger repaired  2009  . Laparoscopic gastrectomy N/A 08/18/2012    Procedure: Laparoscopic Partial Gastrectomy;  Surgeon: [name], MD;  Location: [facility];  Service: General;  Laterality: N/A;  Laparoscopic Partial Gastrectomy,    His Family History Is Significant For: Family History  Problem Relation Age of Onset  . Stroke Father   . Pneumonia Father   . Colon cancer Neg Hx     His Social History Is Significant For: History   Social History  . Marital Status: Widowed    Spouse  Name: N/A    Number of Children: 4  . Years of Education: N/A   Occupational History  . retired    Social History Main Topics  . Smoking status: Former Research scientist (life sciences)  . Smokeless tobacco: Former Systems developer    Quit date: 02/11/1948  . Alcohol Use: No  . Drug Use: No  . Sexual Activity: None   Other Topics Concern  . None   Social History Narrative  . None    His Allergies Are:  Allergies  Allergen Reactions  . Codeine Phosphate Other (See Comments)    mouth sores  :   His Current Medications Are:  Outpatient Encounter Prescriptions as of 10/14/2013  Medication  Sig  . ACCU-CHEK SOFTCLIX LANCETS lancets 1 each by Other route 2 (two) times daily as needed for other.  [name] acetaminophen (TYLENOL) 500 MG tablet Take 1,000 mg by mouth every 6 (six) hours as needed for pain.  [name] amLODipine-valsartan (EXFORGE) 10-320 MG per tablet Take 1 tablet by mouth daily.  [name] aspirin EC 81 MG tablet Take 81 mg by mouth every morning.   [name] atorvastatin (LIPITOR) 10 MG tablet TAKE ONE TABLET BY MOUTH ONCE DAILY  . calcium carbonate-magnesium hydroxide (ROLAIDS) 334 MG CHEW Chew 1 tablet by mouth 2 (two) times daily as needed (indigestion).   . Calcium Citrate-Vitamin D (CITRACAL + D PO) Take 1 tablet by mouth daily.  . Dapagliflozin Propanediol 10 MG TABS Take 1 tablet by mouth daily.  . Denosumab (XGEVA [place]) Inject into the skin every 30 (thirty) days. Injects "around the first part of each month."  . docusate sodium (COLACE) 50 MG capsule Take 50-100 mg by mouth 2 (two) times daily.   [name] donepezil (ARICEPT) 10 MG tablet Take 10 mg by mouth daily.  [name] glimepiride (AMARYL) 4 MG tablet Take 0.5 tablets (2 mg total) by mouth daily before breakfast.  . glucose blood (ACCU-CHEK AVIVA PLUS) test strip 1 each by Other route 2 (two) times daily as needed for other.  [name] leuprolide (LUPRON) 3.75 MG injection Inject 3.75 mg into the muscle every 4 (four) months.  . levothyroxine (SYNTHROID, LEVOTHROID) 50 MCG tablet TAKE ONE  TABLET BY MOUTH EVERY DAY  . LYRICA 50 MG capsule TAKE TWO CAPSULES BY MOUTH IN THE MORNING AND ONE CAP IN THE EVENING  . Memantine HCl ER (NAMENDA XR) 28 MG CP24 Take 28 mg by mouth daily.  . metFORMIN (GLUCOPHAGE) 500 MG tablet Take 500 mg by mouth 2 (two) times daily with a meal.  . Misc Natural Products (OSTEO BI-FLEX ADV DOUBLE ST) TABS Take by mouth.  . nitroGLYCERIN (NITROSTAT) 0.4 MG SL tablet Place 0.4 mg under the tongue every 5 (five) minutes as needed. For chest pain  . Nutritional Supplements (JUICE PLUS FIBRE PO) Take 6 capsules by mouth daily.   [name] omega-3 acid ethyl esters (LOVAZA) 1 G capsule Take 1 g by mouth 2 (two) times daily.  [name] oxybutynin (DITROPAN-XL) 10 MG 24 hr tablet Take 10 mg by mouth daily.   . potassium chloride SA (K-DUR,KLOR-CON) 20 MEQ tablet Take 1 tablet (20 mEq total) by mouth 2 (two) times daily.  . sitaGLIPtin (JANUVIA) 50 MG tablet Take 100 mg by mouth every evening.   . traMADol (ULTRAM) 50 MG tablet   . ZYTIGA 250 MG tablet TAKE 4 TABLETS BY MOUTH DAILY. TAKE ON AN EMPTY STOMACH 1 HOUR BEFORE OR 2 HOURS AFTER A MEAL  :  Review of Systems:  Out of a complete 14 point review of systems, all are reviewed and negative with the exception of these symptoms as listed below:  Review of Systems  Constitutional: Negative.   HENT: Positive for hearing loss.   Eyes: Negative.   Respiratory: Negative.   Cardiovascular: Negative.   Gastrointestinal: Positive for diarrhea.  Endocrine: Negative.   Genitourinary: Negative.   Musculoskeletal: Positive for arthralgias.  Skin: Negative.   Allergic/Immunologic: Negative.   Neurological:       Neurology  Hematological: Negative.   Psychiatric/Behavioral: Negative.     Objective:  Neurologic Exam  Physical Exam Physical Examination:   Filed Vitals:   10/14/13 1133  BP: 175/85  Pulse: 47  Temp: 97 F (  36.1 C)    General Examination: The patient is a very pleasant 80 y.o. male in no acute distress. He  is calm and cooperative with the exam. He denies Auditory Hallucinations and Visual Hallucinations. He is well groomed and situated in a chair.   HEENT: Normocephalic, atraumatic, pupils are equal, round and reactive to light and accommodation. He has cataracts, and R ptosis (this since birth) Extraocular tracking shows mild saccadic breakdown without nystagmus noted. Hearing is impaired. Face is symmetric with no facial masking and normal facial sensation. There is no lip, neck or jaw tremor. Neck is not rigid with intact passive ROM. There are no carotid bruits on auscultation. Oropharynx exam reveals mild mouth dryness. No significant airway crowding is noted. Mallampati is class II. Tongue protrudes centrally and palate elevates symmetrically.    Chest: is clear to auscultation without wheezing, rhonchi or crackles noted.  Heart: sounds are regular and normal without murmurs, rubs or gallops noted.   Abdomen: is soft, non-tender and non-distended with normal bowel sounds appreciated on auscultation.  Extremities: There is trace edema in the ankles bilaterally. Pedal pulses are intact.   Skin: is warm and dry with no trophic changes noted. Age-related changes are noted on the skin. Multiple small bruises of varying ages are noted on the forearms. Of note, He is on ASA.  Musculoskeletal: exam reveals R elbow joint deformity (since 7th grade), and tenderness and mild joint swelling in the R knee. No erythema.   Neurologically:   Mental status: The patient is awake and alert, paying good  attention. He is able to completely provide the history. He is oriented to: person, place, time/date, situation, day of week, month of year and year. His memory, attention, language and knowledge are mildly impaired. There is no aphasia, agnosia, apraxia or anomia. There is a no significant degree of bradyphrenia. Speech is mildly hypophonic with no dysarthria noted. Mood is congruent and affect is blunted and  flat.  In 10/14:  His MMSE (Mini-Mental state exam) score was 30/30.  CDT (Clock Drawing Test) score was 4/4.  AFT (Animal Fluency Test) score was 4.    Cranial nerves are as described above under HEENT exam. In addition, shoulder shrug is normal with equal shoulder height noted.  Motor exam: Normal bulk, and strength for age is noted. Tone is not rigid with absence of cogwheeling in the extremities. There is overall no significant bradykinesia. There is no drift or rebound. There is no tremor. Romberg is negative. Reflexes are 1+ in the upper extremities, trace in both knees and absent in both ankles. Fine motor skills: Finger taps, hand movements, and rapid alternating patting are mildly impaired bilaterally. Foot taps and foot agility are mildly impaired bilaterally.   Cerebellar testing shows no dysmetria or intention tremor on finger to nose testing. There is no truncal or gait ataxia.   Sensory exam is intact to light touch, pinprick, vibration, temperature sense in the upper extremities, and decrease to PP and vibration sense in the distal lower extremities, unchanged.   Gait, station and balance: He stands up from the seated position with mild difficulty and needs to push himself up. No veering to one side is noted. Slight leaning to the R is noted. Posture is mildly stooped. Stance is wide-based. He turns in 3 steps. Tandem walk is not possible. Balance is mildly impaired. He brought a single point walking stick, but can walk without it.   Assessment and Plan:   In summary, Vamsi  L Gerding is a very pleasant 80 year old male with a complex underlying medical history of diabetes, prostate cancer, stomach tumor, osteoporosis, heart disease, hyperlipidemia, thyroid disease and hypertension, status post three-vessel CABG in 1998, MI in 2009 with stent placement, on Lupron, Xgeva and Zytiga therapy for his prostate cancer, s/p radiation therapy, who has a several year history of memory loss. His  history and physical exam are keeping with MCI (mild cognitive impairment) or mild Vascular Dementia without behavioral disturbance and thankfully, he has remained stable for months, probably the past couple of years. He is doing fairly well at this time and I reassured the patient in that regard.  I asked the patient to staying active mentally and physically. I encouraged the patient to eat healthy, exercise daily and keep well hydrated, to keep a scheduled bedtime and wake time routine, to not skip any meals and eat healthy snacks in between meals and to have protein with every meal. I stressed the importance of regular exercise, within of course the patient's own mobility limitations. I encouraged the patient to keep up with current events by reading the news paper or watching the news and to do word puzzles, or if feasible, to go on [email or web address].   As far as further diagnostic testing is concerned, I suggested the following: no new test from my end of things.   As far as medications are concerned, I recommended the following at this time: no change.  I answered all his questions today and the patient was in agreement with the above outlined plan. I would like to see the patient back in 6 months, sooner if the need arises and encouraged him to call with any interim questions, concerns, problems, or updates.

## 2013-10-14 NOTE — Patient Instructions (Addendum)
We will keep the same medicine for your memory. You are stable from my end of things.

## 2013-10-18 ENCOUNTER — Encounter (HOSPITAL_COMMUNITY)
Admission: RE | Admit: 2013-10-18 | Discharge: 2013-10-18 | Disposition: A | Discharge status: Home / Self Care | Payer: Medicare Other | Source: Ambulatory Visit | Attending: Urology | Admitting: Urology

## 2013-10-18 ENCOUNTER — Telehealth: Payer: Self-pay

## 2013-10-18 DIAGNOSIS — C61 Malignant neoplasm of prostate: Secondary | ICD-10-CM | POA: Insufficient documentation

## 2013-10-18 MED ORDER — TECHNETIUM TC 99M MEDRONATE IV KIT
24.7000 | PACK | Freq: Once | INTRAVENOUS | Status: AC | PRN
  Administered 2013-10-18: 24.7 via INTRAVENOUS

## 2013-10-18 NOTE — Telephone Encounter (Signed)
Relevant patient education assigned to patient using Emmi. ° °

## 2013-10-19 ENCOUNTER — Telehealth: Payer: Self-pay | Admitting: Oncology

## 2013-10-19 ENCOUNTER — Ambulatory Visit (HOSPITAL_BASED_OUTPATIENT_CLINIC_OR_DEPARTMENT_OTHER): Payer: Medicare Other | Admitting: Oncology

## 2013-10-19 ENCOUNTER — Other Ambulatory Visit (HOSPITAL_BASED_OUTPATIENT_CLINIC_OR_DEPARTMENT_OTHER): Payer: Medicare Other

## 2013-10-19 ENCOUNTER — Encounter: Payer: Self-pay | Admitting: Oncology

## 2013-10-19 VITALS — BP 185/64 | HR 48 | Temp 97.2°F | Resp 18 | Ht 67.0 in | Wt 177.6 lb

## 2013-10-19 DIAGNOSIS — C61 Malignant neoplasm of prostate: Secondary | ICD-10-CM

## 2013-10-19 DIAGNOSIS — C7952 Secondary malignant neoplasm of bone marrow: Secondary | ICD-10-CM

## 2013-10-19 DIAGNOSIS — C7951 Secondary malignant neoplasm of bone: Secondary | ICD-10-CM

## 2013-10-19 DIAGNOSIS — E291 Testicular hypofunction: Secondary | ICD-10-CM

## 2013-10-19 LAB — CBC WITH DIFFERENTIAL/PLATELET
BASO%: 0.7 % (ref 0.0–2.0)
Basophils Absolute: 0 10*3/uL (ref 0.0–0.1)
EOS ABS: 0.3 10*3/uL (ref 0.0–0.5)
EOS%: 5.9 % (ref 0.0–7.0)
HEMATOCRIT: 32.7 % — AB (ref 38.4–49.9)
HGB: 11.5 g/dL — ABNORMAL LOW (ref 13.0–17.1)
LYMPH#: 1.3 10*3/uL (ref 0.9–3.3)
LYMPH%: 27.3 % (ref 14.0–49.0)
MCH: 31.8 pg (ref 27.2–33.4)
MCHC: 35.2 g/dL (ref 32.0–36.0)
MCV: 90.6 fL (ref 79.3–98.0)
MONO#: 0.4 10*3/uL (ref 0.1–0.9)
MONO%: 8.1 % (ref 0.0–14.0)
NEUT#: 2.7 10*3/uL (ref 1.5–6.5)
NEUT%: 58 % (ref 39.0–75.0)
Platelets: 134 10*3/uL — ABNORMAL LOW (ref 140–400)
RBC: 3.61 10*6/uL — ABNORMAL LOW (ref 4.20–5.82)
RDW: 14.2 % (ref 11.0–14.6)
WBC: 4.7 10*3/uL (ref 4.0–10.3)

## 2013-10-19 LAB — COMPREHENSIVE METABOLIC PANEL (CC13)
ALBUMIN: 3.6 g/dL (ref 3.5–5.0)
ALT: 10 U/L (ref 0–55)
AST: 16 U/L (ref 5–34)
Alkaline Phosphatase: 66 U/L (ref 40–150)
Anion Gap: 11 mEq/L (ref 3–11)
BUN: 25.3 mg/dL (ref 7.0–26.0)
CALCIUM: 9.2 mg/dL (ref 8.4–10.4)
CHLORIDE: 110 meq/L — AB (ref 98–109)
CO2: 25 mEq/L (ref 22–29)
CREATININE: 1.8 mg/dL — AB (ref 0.7–1.3)
GLUCOSE: 190 mg/dL — AB (ref 70–140)
POTASSIUM: 3.1 meq/L — AB (ref 3.5–5.1)
Sodium: 146 mEq/L — ABNORMAL HIGH (ref 136–145)
Total Bilirubin: 1.01 mg/dL (ref 0.20–1.20)
Total Protein: 6.4 g/dL (ref 6.4–8.3)

## 2013-10-19 NOTE — Progress Notes (Signed)
Hematology and Oncology Follow Up Visit  BULMARO FEAGANS [id number] 1930-05-06 80 y.o. 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 His initial diagnosis was in 2006 when he presented with a PSA of 6.26 the Gleason score 4+3 equals 7.  Prior Therapy:  He is status post definitive therapy with radiation and androgen deprivation completed in 2007. The patient developed recurrent disease in 2012 with a PSA rise up to 28 and T9 osseous metastasis and was treated with combined androgen deprivation with Lupron and Casodex. He had an excellent response with his PSA nadir down to 0.19. Most recently he developed a rise in his PSA up to 3.06 in July 2014 despite castrate levels of testosterone. His bone scan in July 2014 confirmed the presence of bony disease.  Current therapy:  Zytiga 1000 mg daily started in July 2014. He receives Niger monthly at [facility] urology. He receives Lupron every 4 months at [facility] urology.  Interim History:   Mr. Dowell returns today for a followup visit. He remains on Zytiga since July of 2014 without any complications. He is not reporting any new bone pain at this time. He does have ongoing right knee and right shoulder pain due to arthritis. This is unchanged and predates the start of his Zytiga. Denies fatigue. His appetite is good and weight is stable. No lower extremity edema. No abdominal pain, nausea, vomiting. His performance status is unchanged. He reports no hospitalization or illnesses. Has not reported any of the fluid retention or any bone pain. Reports that his appetite is good. He does report occasional constipation but no diarrhea.  Medications: I have reviewed the patient's current medications.  Current Outpatient Prescriptions  Medication Sig Dispense Refill  . ACCU-CHEK SOFTCLIX LANCETS lancets 1 each  by Other route 2 (two) times daily as needed for other.  100 each  12  . acetaminophen (TYLENOL) 500 MG tablet Take 1,000 mg by mouth every 6 (six) hours as needed for pain.      [name] amLODipine-valsartan (EXFORGE) 10-320 MG per tablet Take 1 tablet by mouth daily.  90 tablet  1  . aspirin EC 81 MG tablet Take 81 mg by mouth every morning.       [name] atorvastatin (LIPITOR) 10 MG tablet TAKE ONE TABLET BY MOUTH ONCE DAILY  90 tablet  0  . calcium carbonate-magnesium hydroxide (ROLAIDS) 334 MG CHEW Chew 1 tablet by mouth 2 (two) times daily as needed (indigestion).       . Calcium Citrate-Vitamin D (CITRACAL + D PO) Take 1 tablet by mouth daily.      . Dapagliflozin Propanediol 10 MG TABS Take 1 tablet by mouth daily.  30 tablet  2  . Denosumab (XGEVA [place]) Inject into the skin every 30 (thirty) days. Injects "around the first part of each month."      . docusate sodium (COLACE) 50 MG capsule Take 50-100 mg by mouth 2 (two) times daily.       [name] donepezil (ARICEPT) 10 MG tablet Take 10 mg by mouth daily.      [name] glimepiride (AMARYL) 4 MG tablet Take 0.5 tablets (2 mg total) by mouth daily before breakfast.  30 tablet  3  . glucose blood (ACCU-CHEK AVIVA PLUS) test strip 1 each by Other route 2 (two) times daily as needed for other.  100 each  12  . leuprolide (LUPRON)  3.75 MG injection Inject 3.75 mg into the muscle every 4 (four) months.      . levothyroxine (SYNTHROID, LEVOTHROID) 50 MCG tablet TAKE ONE TABLET BY MOUTH EVERY DAY  90 tablet  1  . LYRICA 50 MG capsule TAKE TWO CAPSULES BY MOUTH IN THE MORNING AND ONE CAP IN THE EVENING  90 capsule  5  . Memantine HCl ER (NAMENDA XR) 28 MG CP24 Take 28 mg by mouth daily.  30 capsule  6  . metFORMIN (GLUCOPHAGE) 500 MG tablet Take 500 mg by mouth 2 (two) times daily with a meal.      . Misc Natural Products (OSTEO BI-FLEX ADV DOUBLE ST) TABS Take by mouth.      . Nutritional Supplements (JUICE PLUS FIBRE PO) Take 6 capsules by mouth daily.       [name] omega-3 acid  ethyl esters (LOVAZA) 1 G capsule Take 1 g by mouth 2 (two) times daily.      [name] oxybutynin (DITROPAN-XL) 10 MG 24 hr tablet Take 10 mg by mouth daily.       . potassium chloride SA (K-DUR,KLOR-CON) 20 MEQ tablet Take 1 tablet (20 mEq total) by mouth 2 (two) times daily.  14 tablet  0  . sitaGLIPtin (JANUVIA) 50 MG tablet Take 100 mg by mouth every evening.       . traMADol (ULTRAM) 50 MG tablet       . ZYTIGA 250 MG tablet TAKE 4 TABLETS BY MOUTH DAILY. TAKE ON AN EMPTY STOMACH 1 HOUR BEFORE OR 2 HOURS AFTER A MEAL  120 tablet  1  . nitroGLYCERIN (NITROSTAT) 0.4 MG SL tablet Place 0.4 mg under the tongue every 5 (five) minutes as needed. For chest pain      . [DISCONTINUED] chlorthalidone (HYGROTON) 25 MG tablet Take 12.5 mg by mouth daily.       No current facility-administered medications for this visit.     Allergies:  Allergies  Allergen Reactions  . Codeine Phosphate Other (See Comments)    mouth sores    Past Medical History, Surgical history, Social history, and Family History were reviewed and updated.  Review of Systems: He did not put any headaches blurred vision double vision change in his alteration of mental status psychiatric issues or depression. Does not report any weight loss appetite changes or constitutional symptoms. Does not report any fevers or chills or sweats. As I put any chest pain shortness of breath cough hemoptysis hematemesis. Report any abdominal pain or distention. Stop any frequency urgency or hematuria. No bleeding or thrombosis issues Remaining ROS negative.  Physical Exam: Blood pressure 185/64, pulse 48, temperature 97.2 F (36.2 C), temperature source Oral, resp. rate 18, height 5\' 7"  (1.702 m), weight 177 lb 9.6 oz (80.559 kg), SpO2 98.00%. ECOG: 1 General appearance: alert, cooperative and no distress Head: Normocephalic, without obvious abnormality, atraumatic Neck: no adenopathy, no carotid bruit, no JVD, supple, symmetrical, trachea midline  and thyroid not enlarged, symmetric, no tenderness/mass/nodules Lymph nodes: Cervical, supraclavicular, and axillary nodes normal. Heart:regular rate and rhythm, S1, S2 normal, no murmur, click, rub or gallop Lung:chest clear, no wheezing, rales, normal symmetric air entry, no tachypnea, retractions or cyanosis Abdomen: soft, non-tender, without masses or organomegaly EXT:no erythema, induration, or nodules   Lab Results: Lab Results  Component Value Date   WBC 4.7 10/19/2013   HGB 11.5* 10/19/2013   HCT 32.7* 10/19/2013   MCV 90.6 10/19/2013   PLT 134* 10/19/2013     Chemistry  Component Value Date/Time   NA 146* 10/19/2013 1114   NA 138 05/07/2013 1522   K 3.1* 10/19/2013 1114   K 3.8 05/07/2013 1522   CL 104 05/07/2013 1522   CO2 25 10/19/2013 1114   CO2 26 05/07/2013 1522   BUN 25.3 10/19/2013 1114   BUN 20 05/07/2013 1522   CREATININE 1.8* 10/19/2013 1114   CREATININE 1.16 05/07/2013 1522   CREATININE 1.38* 02/11/2012 1614      Component Value Date/Time   CALCIUM 9.2 10/19/2013 1114   CALCIUM 9.3 05/07/2013 1522   ALKPHOS 66 10/19/2013 1114   ALKPHOS 54 05/07/2013 1522   AST 16 10/19/2013 1114   AST 22 05/07/2013 1522   ALT 10 10/19/2013 1114   ALT 20 05/07/2013 1522   BILITOT 1.01 10/19/2013 1114   BILITOT 0.7 05/07/2013 1522     Results for AUGUSTEN, LIPKIN (MRN [id number]) as of 10/19/2013 11:43  Ref. Range 03/22/2013 12:36 05/07/2013 15:11 07/16/2013 11:13 08/10/2013 11:20 09/14/2013 11:50  PSA Latest Range: <=4.00 ng/mL 1.12 0.74 0.95 1.15 1.61    Impression and Plan: This is an 80 year old gentleman with the following issues: 1. 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 His initial diagnosis dates back to 2006 with PSA of 6.26 and a Gleason score of 7. He is currently on Zytiga since July 2014 is tolerating it well. PSA is down to 0.74 and now up to 1.61 and is pending today. The plan is to continue on Zytiga. We will monitor his PSA, electrolytes, and liver  function monthly. My plan is to continue him on Zytiga despite the slow rise in PSA for the time being. If he develops a rapid rise in his PSA with a rapid doubling time we will consider switch him to a different agent  2. Bony disease: He is receiving Xgeva at [facility] urology on a monthly basis.  3. Hormonal deprivation: He is receiving Lupron at [facility] urology every 4 months.  4. Weight loss: Reports that he is trying to lose weight. Will monitor weight closely. Consider referral to [facility] is weight loss continues.   5. Followup: He will return in 4-5 weeks.   Maryanna Shape 4/21/20153:35 PM

## 2013-10-19 NOTE — Telephone Encounter (Signed)
gv adn pritned aptps ched and avs for opt for May

## 2013-10-20 LAB — PSA: PSA: 2.35 ng/mL (ref <=4.00)

## 2013-10-24 ENCOUNTER — Other Ambulatory Visit: Payer: Self-pay | Admitting: Internal Medicine

## 2013-11-08 ENCOUNTER — Other Ambulatory Visit: Payer: Self-pay | Admitting: Internal Medicine

## 2013-11-22 ENCOUNTER — Other Ambulatory Visit: Payer: Self-pay | Admitting: Internal Medicine

## 2013-11-24 ENCOUNTER — Other Ambulatory Visit: Payer: Medicare Other

## 2013-11-24 ENCOUNTER — Ambulatory Visit: Payer: Medicare Other | Admitting: Oncology

## 2013-11-29 ENCOUNTER — Telehealth: Payer: Self-pay | Admitting: Oncology

## 2013-11-29 ENCOUNTER — Other Ambulatory Visit: Payer: Self-pay | Admitting: Internal Medicine

## 2013-11-29 ENCOUNTER — Other Ambulatory Visit: Payer: Self-pay | Admitting: Oncology

## 2013-11-29 ENCOUNTER — Other Ambulatory Visit: Payer: Self-pay | Admitting: Neurology

## 2013-11-29 ENCOUNTER — Telehealth: Payer: Self-pay | Admitting: Medical Oncology

## 2013-11-29 NOTE — Telephone Encounter (Signed)
I have sent a POF to get him rescheduled within the next 2 weeks. I have sent a 30 day supply of his Zytiga to his pharmacy.  Thanks

## 2013-11-29 NOTE — Telephone Encounter (Signed)
Dr Athar is out of the office.  Forwarding request to [facility] for approval.   

## 2013-11-29 NOTE — Telephone Encounter (Signed)
Per Josetta Huddle, NP, call to patient regarding missed appt last week. Patient states he "must have forgotten, as far as I know I am doing well." Informed patient that we will be r/s him to see MD and to expect call from our scheduling dept for a f/u. Patient expressed thanks, encouraged patient to contact office should he have any questions or concerns. Patient gave verbal understanding.   Mssg forwarded to NP.

## 2013-11-29 NOTE — Telephone Encounter (Signed)
returned pt call and r/s missed appt...done...pt aware of new d.t °

## 2013-12-04 ENCOUNTER — Other Ambulatory Visit: Payer: Self-pay | Admitting: Internal Medicine

## 2013-12-06 ENCOUNTER — Other Ambulatory Visit: Payer: Self-pay | Admitting: Oncology

## 2013-12-08 ENCOUNTER — Telehealth: Payer: Self-pay | Admitting: Oncology

## 2013-12-08 ENCOUNTER — Encounter: Payer: Self-pay | Admitting: Oncology

## 2013-12-08 ENCOUNTER — Other Ambulatory Visit: Payer: Self-pay | Admitting: *Deleted

## 2013-12-08 ENCOUNTER — Other Ambulatory Visit (HOSPITAL_BASED_OUTPATIENT_CLINIC_OR_DEPARTMENT_OTHER): Payer: Medicare Other

## 2013-12-08 ENCOUNTER — Ambulatory Visit (HOSPITAL_BASED_OUTPATIENT_CLINIC_OR_DEPARTMENT_OTHER): Payer: Medicare Other | Admitting: Oncology

## 2013-12-08 VITALS — BP 181/73 | HR 65 | Temp 97.8°F | Resp 20 | Ht 67.0 in | Wt 173.5 lb

## 2013-12-08 DIAGNOSIS — C7952 Secondary malignant neoplasm of bone marrow: Secondary | ICD-10-CM

## 2013-12-08 DIAGNOSIS — R634 Abnormal weight loss: Secondary | ICD-10-CM

## 2013-12-08 DIAGNOSIS — C7951 Secondary malignant neoplasm of bone: Secondary | ICD-10-CM

## 2013-12-08 DIAGNOSIS — C61 Malignant neoplasm of prostate: Secondary | ICD-10-CM

## 2013-12-08 LAB — CBC WITH DIFFERENTIAL/PLATELET
BASO%: 0.9 % (ref 0.0–2.0)
Basophils Absolute: 0 10*3/uL (ref 0.0–0.1)
EOS%: 5.5 % (ref 0.0–7.0)
Eosinophils Absolute: 0.3 10*3/uL (ref 0.0–0.5)
HCT: 38.5 % (ref 38.4–49.9)
HGB: 13.2 g/dL (ref 13.0–17.1)
LYMPH#: 1.4 10*3/uL (ref 0.9–3.3)
LYMPH%: 28.1 % (ref 14.0–49.0)
MCH: 31 pg (ref 27.2–33.4)
MCHC: 34.4 g/dL (ref 32.0–36.0)
MCV: 90.2 fL (ref 79.3–98.0)
MONO#: 0.5 10*3/uL (ref 0.1–0.9)
MONO%: 9.6 % (ref 0.0–14.0)
NEUT#: 2.8 10*3/uL (ref 1.5–6.5)
NEUT%: 55.9 % (ref 39.0–75.0)
Platelets: 180 10*3/uL (ref 140–400)
RBC: 4.27 10*6/uL (ref 4.20–5.82)
RDW: 13.8 % (ref 11.0–14.6)
WBC: 5 10*3/uL (ref 4.0–10.3)

## 2013-12-08 LAB — COMPREHENSIVE METABOLIC PANEL (CC13)
ALT: 14 U/L (ref 0–55)
AST: 17 U/L (ref 5–34)
Albumin: 3.7 g/dL (ref 3.5–5.0)
Alkaline Phosphatase: 74 U/L (ref 40–150)
Anion Gap: 13 mEq/L — ABNORMAL HIGH (ref 3–11)
BUN: 27.3 mg/dL — ABNORMAL HIGH (ref 7.0–26.0)
CALCIUM: 10.9 mg/dL — AB (ref 8.4–10.4)
CHLORIDE: 106 meq/L (ref 98–109)
CO2: 30 mEq/L — ABNORMAL HIGH (ref 22–29)
CREATININE: 1.7 mg/dL — AB (ref 0.7–1.3)
Glucose: 130 mg/dl (ref 70–140)
Potassium: 2.8 mEq/L — CL (ref 3.5–5.1)
SODIUM: 149 meq/L — AB (ref 136–145)
TOTAL PROTEIN: 6.7 g/dL (ref 6.4–8.3)
Total Bilirubin: 0.95 mg/dL (ref 0.20–1.20)

## 2013-12-08 MED ORDER — POTASSIUM CHLORIDE CRYS ER 20 MEQ PO TBCR: 20.0000 meq | EXTENDED_RELEASE_TABLET | Freq: Every day | ORAL | Status: DC

## 2013-12-08 NOTE — Progress Notes (Signed)
Hematology and Oncology Follow Up Visit  [name] [name] [id number] 04/27/1930 80 y.o. 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 His initial diagnosis was in 2006 when he presented with a PSA of 6.26 the Gleason score 4+3 equals 7.  Prior Therapy:  He is status post definitive therapy with radiation and androgen deprivation completed in 2007. The patient developed recurrent disease in 2012 with a PSA rise up to 28 and T9 osseous metastasis and was treated with combined androgen deprivation with Lupron and Casodex. He had an excellent response with his PSA nadir down to 0.19. Most recently he developed a rise in his PSA up to 3.06 in July 2014 despite castrate levels of testosterone. His bone scan in July 2014 confirmed the presence of bony disease.  Current therapy:  Zytiga 1000 mg daily started in July 2014. He receives Niger monthly at [facility] urology. He receives Lupron every 4 months at [facility] urology.  Interim History:   [name] [name] returns today for a followup visit. He is accompanied by his family today and reporting a slight decline since his last visit. He is reporting more fatigue, weakness and weight loss. He was also noted to have decline in his memory as well. He remains on Zytiga since July of 2014 without any complications from the medication. He is not reporting any new bone pain at this time. He does have ongoing right knee and right shoulder pain due to arthritis. This is unchanged . He reports no lower extremity edema. No abdominal pain, nausea, vomiting. His performance status is unchanged. He reports no hospitalization or illnesses. Has not reported any of the fluid retention or any bone pain. He does report occasional incontinence of stool but no recent diarrhea. He does not report any headaches or blurry vision. He does  not report any syncope or falls. He does not report chest pain, shortness of breath or difficulty breathing. Did not report any genitourinary bleeding or hemoptysis. Rest of his review of systems unremarkable.  Medications: I have reviewed the patient's current medications.  Current Outpatient Prescriptions  Medication Sig Dispense Refill  . ACCU-CHEK SOFTCLIX LANCETS lancets 1 each by Other route 2 (two) times daily as needed for other.  100 each  12  . acetaminophen (TYLENOL) 500 MG tablet Take 1,000 mg by mouth every 6 (six) hours as needed for pain.      [name] amLODipine-valsartan (EXFORGE) 10-320 MG per tablet Take 1 tablet by mouth daily.  90 tablet  1  . aspirin EC 81 MG tablet Take 81 mg by mouth every morning.       [name] atorvastatin (LIPITOR) 10 MG tablet TAKE ONE TABLET BY MOUTH ONCE DAILY  90 tablet  0  . calcium carbonate-magnesium hydroxide (ROLAIDS) 334 MG CHEW Chew 1 tablet by mouth 2 (two) times daily as needed (indigestion).       . Calcium Citrate-Vitamin D (CITRACAL + D PO) Take 1 tablet by mouth daily.      . Dapagliflozin Propanediol 10 MG TABS Take 1 tablet by mouth daily.  30 tablet  2  . Denosumab (XGEVA [place]) Inject into the skin every 30 (thirty) days. Injects "around the first part of each month."      . desmopressin (DDAVP) 0.2 MG tablet Take 0.2 mg by mouth 2 (two) times daily.      [name] docusate sodium (COLACE) 50  MG capsule Take 50-100 mg by mouth 2 (two) times daily.       [name] donepezil (ARICEPT) 10 MG tablet TAKE 1 TABLET BY MOUTH ONCE DAILY  90 tablet  1  . glimepiride (AMARYL) 4 MG tablet TAKE 1 TABLET BY MOUTH DAILY BEFORE BREAKFAST  30 tablet  5  . glucose blood (ACCU-CHEK AVIVA PLUS) test strip 1 each by Other route 2 (two) times daily as needed for other.  100 each  12  . leuprolide (LUPRON) 3.75 MG injection Inject 3.75 mg into the muscle every 4 (four) months.      . levothyroxine (SYNTHROID, LEVOTHROID) 50 MCG tablet TAKE 1 TABLET BY MOUTH EVERY DAY  90 tablet  1  .  LYRICA 50 MG capsule TAKE 2 CAPSULES BY MOUTH EVERY MORNING AND 1 CAPSULE IN THE EVENING  90 capsule  5  . Memantine HCl ER (NAMENDA XR) 28 MG CP24 Take 28 mg by mouth daily.  30 capsule  6  . metFORMIN (GLUCOPHAGE) 500 MG tablet Take 500 mg by mouth 2 (two) times daily with a meal.      . Misc Natural Products (OSTEO BI-FLEX ADV DOUBLE ST) TABS Take by mouth.      . nitroGLYCERIN (NITROSTAT) 0.4 MG SL tablet Place 0.4 mg under the tongue every 5 (five) minutes as needed. For chest pain      . Nutritional Supplements (JUICE PLUS FIBRE PO) Take 6 capsules by mouth daily.       [name] omega-3 acid ethyl esters (LOVAZA) 1 G capsule Take 1 g by mouth 2 (two) times daily.      [name] oxybutynin (DITROPAN-XL) 10 MG 24 hr tablet Take 10 mg by mouth daily.       . traMADol (ULTRAM) 50 MG tablet TAKE 1 TABLET BY MOUTH EVERY 8 HOURS AS NEEDED  90 tablet  2  . ZYTIGA 250 MG tablet TAKE 4 TABLETS BY MOUTH DAILY. TAKE ON AN EMPTY STOMACH 1 HOUR BEFORE OR 2 HOURS AFTER A MEAL  120 tablet  0  . [DISCONTINUED] chlorthalidone (HYGROTON) 25 MG tablet Take 12.5 mg by mouth daily.       No current facility-administered medications for this visit.     Allergies:  Allergies  Allergen Reactions  . Codeine Phosphate Other (See Comments)    mouth sores    Past Medical History, Surgical history, Social history, and Family History were reviewed and updated.    Physical Exam: Blood pressure 181/73, pulse 65, temperature 97.8 F (36.6 C), temperature source Oral, resp. rate 20, height 5\' 7"  (1.702 m), weight 173 lb 8 oz (78.699 kg). ECOG: 1 General appearance: alert and cooperative Head: Normocephalic, without obvious abnormality, atraumatic Neck: no adenopathy Lymph nodes: Cervical, supraclavicular, and axillary nodes normal. Heart:regular rate and rhythm, S1, S2 normal, no murmur, click, rub or gallop Lung:chest clear, no wheezing, rales, or dose to percussion Abdomen: soft, non-tender, without masses or  organomegaly EXT:no erythema, induration, or nodules   Lab Results: Lab Results  Component Value Date   WBC 5.0 12/08/2013   HGB 13.2 12/08/2013   HCT 38.5 12/08/2013   MCV 90.2 12/08/2013   PLT 180 12/08/2013     Chemistry      Component Value Date/Time   NA 146* 10/19/2013 1114   NA 138 05/07/2013 1522   K 3.1* 10/19/2013 1114   K 3.8 05/07/2013 1522   CL 104 05/07/2013 1522   CO2 25 10/19/2013 1114   CO2 26 05/07/2013 1522  BUN 25.3 10/19/2013 1114   BUN 20 05/07/2013 1522   CREATININE 1.8* 10/19/2013 1114   CREATININE 1.16 05/07/2013 1522   CREATININE 1.38* 02/11/2012 1614      Component Value Date/Time   CALCIUM 9.2 10/19/2013 1114   CALCIUM 9.3 05/07/2013 1522   ALKPHOS 66 10/19/2013 1114   ALKPHOS 54 05/07/2013 1522   AST 16 10/19/2013 1114   AST 22 05/07/2013 1522   ALT 10 10/19/2013 1114   ALT 20 05/07/2013 1522   BILITOT 1.01 10/19/2013 1114   BILITOT 0.7 05/07/2013 1522       Results for [name] [name], [name] [name] (MRN [id number]) as of 12/08/2013 15:11  Ref. Range 09/14/2013 11:50 10/19/2013 11:14  PSA Latest Range: <=4.00 ng/mL 1.61 2.35   Impression and Plan: This is an 80 year old gentleman with the following issues: 1. 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 His initial diagnosis dates back to 2006 with PSA of 6.26 and a Gleason score of 7. He is currently on Zytiga since July 2014 is tolerating it well. His PSA was up to 2.35 in April 2015. His PSA is pending from today and if it continues to rise, we will switch him to [place]. Risks and benefits of this drug were discussed today in detail the patient and his family. Written information was given to the patient and his willing to proceed if his PSA continues to rise. He did have a document progression by a bone scan that was done in April 2015.  2. Bony disease: He is receiving Xgeva at [facility] urology on a monthly basis.  3. Hormonal deprivation: He is receiving Lupron at [facility] urology every 4 months.  4.  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   5. Followup: He will return in 4-5 weeks.   [facility] 6/10/20153:26 PM

## 2013-12-08 NOTE — Telephone Encounter (Signed)
Spoke with daughter, patient's potassium is very low and dr Alen Blew wants him to re-start the k-dur 20 meg. Will e-scribe to patient's pharmacy.

## 2013-12-08 NOTE — Telephone Encounter (Signed)
gv adn printed appt sched and avs for pt for July  °

## 2013-12-09 LAB — PSA: PSA: 3.37 ng/mL (ref <=4.00)

## 2014-01-02 ENCOUNTER — Other Ambulatory Visit: Payer: Self-pay | Admitting: Oncology

## 2014-01-03 ENCOUNTER — Encounter: Payer: Self-pay | Admitting: Internal Medicine

## 2014-01-03 ENCOUNTER — Ambulatory Visit (INDEPENDENT_AMBULATORY_CARE_PROVIDER_SITE_OTHER): Payer: Medicare Other | Admitting: Internal Medicine

## 2014-01-03 ENCOUNTER — Other Ambulatory Visit: Payer: Self-pay | Admitting: Oncology

## 2014-01-03 VITALS — BP 120/62 | HR 71 | Temp 97.9°F | Resp 20 | Ht 67.0 in | Wt 175.0 lb

## 2014-01-03 DIAGNOSIS — E119 Type 2 diabetes mellitus without complications: Secondary | ICD-10-CM

## 2014-01-03 DIAGNOSIS — G3184 Mild cognitive impairment, so stated: Secondary | ICD-10-CM

## 2014-01-03 DIAGNOSIS — I10 Essential (primary) hypertension: Secondary | ICD-10-CM

## 2014-01-03 DIAGNOSIS — E1149 Type 2 diabetes mellitus with other diabetic neurological complication: Secondary | ICD-10-CM

## 2014-01-03 DIAGNOSIS — C61 Malignant neoplasm of prostate: Secondary | ICD-10-CM

## 2014-01-03 NOTE — Progress Notes (Signed)
Subjective:    Patient ID: [name] [name], male    DOB: [birth year], 80 y.o.   MRN: [id number]  HPI  80 year old patient seen today for followup.  He has type 2 diabetes, given by peripheral neuropathy.  His last hemoglobin A1c was quite well controlled.  He has been off [place] for about 3 weeks.  He held off getting a refill due to a 40 dollar monthly co-pay.  He feels his blood sugars have worsened since discontinuation of this medication. Laboratory studies revealed  hypokalemia and he has been on a potassium supplement.  Denies any significant nausea, vomiting, or diarrhea.  He is followed closely by urology due to prostate cancer.  PSA has been fairly stable with slowly increasing  Past Medical History  Diagnosis Date  . Hypertension   . Hyperlipidemia   . Memory loss     mild  . Rectal ulcer     RESOLVED  . Constipation   . History of ventricular fibrillation 1998    while on treadmill  . Hypothyroidism   . IBS (irritable bowel syndrome)   . Colitis     RESOLVED  . ED (erectile dysfunction)   . Osteoporosis   . Depression   . Diabetes mellitus     fasting blood sugar 140-200 usually  . GERD (gastroesophageal reflux disease)   . Heart attack 2009  . Dementia   . Neuromuscular disorder     DIABETIC NEUROPATHY  . Prostate cancer   . Pain     LOWER BACK  -- PAST HX OF STEROID EPIDURALS FOR PAIN RELIEF  . 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 CAD (coronary artery disease)     a. S/P CABG x 5 1998; b.  S/P stent LCX 2009;  c.  LHC 5/09: RCA proximal 90%, 95%, distally occluded, pCFX stents patent, OM1 30%, small left atrial branch 95%, oLAD occluded, S-dRCA patent, L-LAD/Dx patent, EF 60%=> Med Rx  ;  d.  Lex MV 3/13:  EF 63%, no ischemia  e.  Lex Myoview 03/08/13:  EF 56%, inf and apical defect suggestive of thinning and soft tissue  attenuation, no ischemia; low risk.      History   Social History  . Marital Status: Widowed    Spouse Name: N/A    Number of Children: 4  . Years of Education: N/A   Occupational History  . retired    Social History Main Topics  . Smoking status: Former Research scientist (life sciences)  . Smokeless tobacco: Former Systems developer    Quit date: 02/11/1948  . Alcohol Use: No  . Drug Use: No  . Sexual Activity: Not on file   Other Topics Concern  . Not on file   Social History Narrative  . No narrative on file    Past Surgical History  Procedure Laterality Date  . Coronary artery bypass graft  1998    x5, with a left internal mammary to the LAD and diagonal, vein graft to the OM1 and 2, and a vein graft to the right coronary artery  . Coronary stent placement  11/13/2007    LCX  . Trigger finger repaired  2009  . Laparoscopic gastrectomy N/A 08/18/2012    Procedure: Laparoscopic Partial Gastrectomy;  Surgeon: [name], MD;  Location: [facility];  Service: General;  Laterality: N/A;  Laparoscopic Partial Gastrectomy,    Family History  Problem Relation Age of Onset  . Stroke Father   . Pneumonia Father   . Colon cancer Neg Hx     Allergies  Allergen Reactions  . Codeine Phosphate Other (See Comments)    mouth sores    Current Outpatient Prescriptions on File Prior to Visit  Medication Sig Dispense Refill  . ACCU-CHEK SOFTCLIX LANCETS lancets 1 each by Other route 2 (two) times daily as needed for other.  100 each  12  . acetaminophen (TYLENOL) 500 MG tablet Take 1,000 mg by mouth every 6 (six) hours as needed for pain.      [name] amLODipine-valsartan (EXFORGE) 10-320 MG per tablet Take 1 tablet by mouth daily.  90 tablet  1  . aspirin EC 81 MG tablet Take 81 mg by mouth every morning.       [name] atorvastatin (LIPITOR) 10 MG tablet TAKE ONE TABLET BY MOUTH ONCE DAILY  90 tablet  0  . calcium carbonate-magnesium hydroxide (ROLAIDS) 334 MG CHEW Chew 1 tablet by mouth 2 (two) times daily as needed  (indigestion).       . Calcium Citrate-Vitamin D (CITRACAL + D PO) Take 1 tablet by mouth daily.      . Dapagliflozin Propanediol 10 MG TABS Take 1 tablet by mouth daily.  30 tablet  2  . Denosumab (XGEVA [place]) Inject into the skin every 30 (thirty) days. Injects "around the first part of each month."      . desmopressin (DDAVP) 0.2 MG tablet Take 0.2 mg by mouth 2 (two) times daily.      [name] docusate sodium (COLACE) 50 MG capsule Take 50-100 mg by mouth 2 (two) times daily.       [name] donepezil (ARICEPT) 10 MG tablet TAKE 1 TABLET BY MOUTH ONCE DAILY  90 tablet  1  . glimepiride (AMARYL) 4 MG tablet TAKE 1 TABLET BY MOUTH DAILY BEFORE BREAKFAST  30 tablet  5  . glucose blood (ACCU-CHEK AVIVA PLUS) test strip 1 each by Other route 2 (two) times daily as needed for other.  100 each  12  . leuprolide (LUPRON) 3.75 MG injection Inject 3.75 mg into the muscle every 4 (four) months.      . levothyroxine (SYNTHROID, LEVOTHROID) 50 MCG tablet TAKE 1 TABLET BY MOUTH EVERY DAY  90 tablet  1  . LYRICA 50 MG capsule TAKE 2 CAPSULES BY MOUTH EVERY MORNING AND 1 CAPSULE IN THE EVENING  90 capsule  5  . Memantine HCl ER (NAMENDA XR) 28 MG CP24 Take 28 mg by mouth daily.  30 capsule  6  . metFORMIN (GLUCOPHAGE) 500 MG tablet Take 500 mg by mouth 2 (two) times daily with a meal.      . Misc Natural Products (OSTEO BI-FLEX ADV DOUBLE ST) TABS Take by mouth.      . nitroGLYCERIN (NITROSTAT) 0.4 MG SL tablet Place 0.4 mg under the tongue every 5 (five) minutes as needed. For chest pain      . Nutritional Supplements (JUICE PLUS FIBRE PO) Take 6 capsules by mouth daily.       [name] omega-3 acid ethyl esters (LOVAZA) 1 G capsule Take 1 g by mouth 2 (two) times daily.      [name] oxybutynin (DITROPAN-XL) 10 MG 24 hr tablet Take 10 mg by mouth daily.       . potassium chloride SA (K-DUR,KLOR-CON) 20 MEQ tablet TAKE 1 TABLET BY MOUTH DAILY  30 tablet  0  . traMADol (ULTRAM) 50  MG tablet TAKE 1 TABLET BY MOUTH EVERY 8 HOURS AS NEEDED   90 tablet  2  . ZYTIGA 250 MG tablet TAKE 4 TABLETS BY MOUTH DAILY. TAKE ON AN EMPTY STOMACH 1 HOUR BEFORE OR 2 HOURS AFTER A MEAL  120 tablet  0  . [DISCONTINUED] chlorthalidone (HYGROTON) 25 MG tablet Take 12.5 mg by mouth daily.       No current facility-administered medications on file prior to visit.    BP 120/62  Pulse 71  Temp(Src) 97.9 F (36.6 C) (Oral)  Resp 20  Ht 5\' 7"  (1.702 m)  Wt 175 lb (79.379 kg)  BMI 27.40 kg/m2  SpO2 97%       Review of Systems  Constitutional: Negative for fever, chills, appetite change and fatigue.  HENT: Negative for congestion, dental problem, ear pain, hearing loss, sore throat, tinnitus, trouble swallowing and voice change.   Eyes: Negative for pain, discharge and visual disturbance.  Respiratory: Negative for cough, chest tightness, wheezing and stridor.   Cardiovascular: Negative for chest pain, palpitations and leg swelling.  Gastrointestinal: Negative for nausea, vomiting, abdominal pain, diarrhea, constipation, blood in stool and abdominal distention.  Genitourinary: Negative for urgency, hematuria, flank pain, discharge, difficulty urinating and genital sores.  Musculoskeletal: Negative for arthralgias, back pain, gait problem, joint swelling, myalgias and neck stiffness.  Skin: Negative for rash.  Neurological: Positive for numbness. Negative for dizziness, syncope, speech difficulty, weakness and headaches.  Hematological: Negative for adenopathy. Does not bruise/bleed easily.  Psychiatric/Behavioral: Negative for behavioral problems and dysphoric mood. The patient is not nervous/anxious.        Objective:   Physical Exam  Constitutional: He is oriented to person, place, and time. He appears well-developed.  Blood pressure 120/62  HENT:  Head: Normocephalic.  Right Ear: External ear normal.  Left Ear: External ear normal.  Eyes: Conjunctivae and EOM are normal.  Neck: Normal range of motion.  Cardiovascular: Normal rate  and normal heart sounds.   Pulmonary/Chest: Breath sounds normal.  Abdominal: Bowel sounds are normal.  Musculoskeletal: Normal range of motion. He exhibits no edema and no tenderness.  Neurological: He is alert and oriented to person, place, and time.  Decreased vibratory sensation, left greater than the right  Psychiatric: He has a normal mood and affect. His behavior is normal.          Assessment & Plan:   Diabetes mellitus.  We'll check a hemoglobin A1c Hypertension well controlled Hypothyroidism Coronary artery disease, stable History of hypokalemia.  Will check followup electrolytes Recheck 3 months

## 2014-01-03 NOTE — Patient Instructions (Signed)
Limit your sodium (Salt) intake   Please check your hemoglobin A1c every 3 months    It is important that you exercise regularly, at least 20 minutes 3 to 4 times per week.  If you develop chest pain or shortness of breath seek  medical attention.   

## 2014-01-03 NOTE — Progress Notes (Signed)
Pre visit review using our clinic review tool, if applicable. No additional management support is needed unless otherwise documented below in the visit note. 

## 2014-01-04 LAB — BASIC METABOLIC PANEL
BUN: 21 mg/dL (ref 6–23)
CHLORIDE: 100 meq/L (ref 96–112)
CO2: 28 mEq/L (ref 19–32)
CREATININE: 1.4 mg/dL (ref 0.4–1.5)
Calcium: 9.2 mg/dL (ref 8.4–10.5)
GFR: 50.79 mL/min — ABNORMAL LOW (ref >60.00)
Glucose, Bld: 324 mg/dL — ABNORMAL HIGH (ref 70–99)
POTASSIUM: 3.3 meq/L — AB (ref 3.5–5.1)
Sodium: 135 mEq/L (ref 135–145)

## 2014-01-04 LAB — HEMOGLOBIN A1C: HEMOGLOBIN A1C: 7.3 % — AB (ref 4.6–6.5)

## 2014-01-05 ENCOUNTER — Encounter: Payer: Self-pay | Admitting: Physician Assistant

## 2014-01-05 ENCOUNTER — Telehealth: Payer: Self-pay | Admitting: Oncology

## 2014-01-05 ENCOUNTER — Other Ambulatory Visit (HOSPITAL_BASED_OUTPATIENT_CLINIC_OR_DEPARTMENT_OTHER): Payer: Medicare Other

## 2014-01-05 ENCOUNTER — Ambulatory Visit (HOSPITAL_BASED_OUTPATIENT_CLINIC_OR_DEPARTMENT_OTHER): Payer: Medicare Other | Admitting: Physician Assistant

## 2014-01-05 VITALS — BP 178/68 | HR 62 | Temp 98.2°F | Resp 18 | Ht 67.0 in | Wt 174.5 lb

## 2014-01-05 DIAGNOSIS — C7951 Secondary malignant neoplasm of bone: Secondary | ICD-10-CM

## 2014-01-05 DIAGNOSIS — C7952 Secondary malignant neoplasm of bone marrow: Secondary | ICD-10-CM

## 2014-01-05 DIAGNOSIS — E291 Testicular hypofunction: Secondary | ICD-10-CM

## 2014-01-05 DIAGNOSIS — C61 Malignant neoplasm of prostate: Secondary | ICD-10-CM

## 2014-01-05 LAB — CBC WITH DIFFERENTIAL/PLATELET
BASO%: 0.8 % (ref 0.0–2.0)
Basophils Absolute: 0 10*3/uL (ref 0.0–0.1)
EOS ABS: 0.3 10*3/uL (ref 0.0–0.5)
EOS%: 5.4 % (ref 0.0–7.0)
HCT: 35.1 % — ABNORMAL LOW (ref 38.4–49.9)
HGB: 12.3 g/dL — ABNORMAL LOW (ref 13.0–17.1)
LYMPH%: 20.4 % (ref 14.0–49.0)
MCH: 30.9 pg (ref 27.2–33.4)
MCHC: 35 g/dL (ref 32.0–36.0)
MCV: 88.3 fL (ref 79.3–98.0)
MONO#: 0.4 10*3/uL (ref 0.1–0.9)
MONO%: 8.7 % (ref 0.0–14.0)
NEUT%: 64.7 % (ref 39.0–75.0)
NEUTROS ABS: 3.1 10*3/uL (ref 1.5–6.5)
PLATELETS: 165 10*3/uL (ref 140–400)
RBC: 3.98 10*6/uL — ABNORMAL LOW (ref 4.20–5.82)
RDW: 13.8 % (ref 11.0–14.6)
WBC: 4.7 10*3/uL (ref 4.0–10.3)
lymph#: 1 10*3/uL (ref 0.9–3.3)

## 2014-01-05 LAB — COMPREHENSIVE METABOLIC PANEL (CC13)
ALK PHOS: 77 U/L (ref 40–150)
ALT: 16 U/L (ref 0–55)
AST: 13 U/L (ref 5–34)
Albumin: 3.4 g/dL — ABNORMAL LOW (ref 3.5–5.0)
Anion Gap: 10 mEq/L (ref 3–11)
BUN: 23.4 mg/dL (ref 7.0–26.0)
CO2: 25 mEq/L (ref 22–29)
Calcium: 9.3 mg/dL (ref 8.4–10.4)
Chloride: 102 mEq/L (ref 98–109)
Creatinine: 1.5 mg/dL — ABNORMAL HIGH (ref 0.7–1.3)
GLUCOSE: 328 mg/dL — AB (ref 70–140)
Potassium: 3.1 mEq/L — ABNORMAL LOW (ref 3.5–5.1)
Sodium: 137 mEq/L (ref 136–145)
TOTAL PROTEIN: 6.2 g/dL — AB (ref 6.4–8.3)
Total Bilirubin: 0.93 mg/dL (ref 0.20–1.20)

## 2014-01-05 NOTE — Telephone Encounter (Signed)
gv adn printed appt sched anda vs for pt for Aug °

## 2014-01-05 NOTE — Progress Notes (Addendum)
Hematology and Oncology Follow Up Visit  JONEL WELDON [id number] Feb 17, 1930 80 y.o. 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 His initial diagnosis was in 2006 when he presented with a PSA of 6.26 the Gleason score 4+3 equals 7.  Prior Therapy:  He is status post definitive therapy with radiation and androgen deprivation completed in 2007. The patient developed recurrent disease in 2012 with a PSA rise up to 28 and T9 osseous metastasis and was treated with combined androgen deprivation with Lupron and Casodex. He had an excellent response with his PSA nadir down to 0.19. Most recently he developed a rise in his PSA up to 3.06 in July 2014 despite castrate levels of testosterone. His bone scan in July 2014 confirmed the presence of bony disease.  Current therapy:  Zytiga 1000 mg daily started in July 2014. 80 01/05/2014 secondary to evidence for disease progression on bone scan as well as a rising PSA level. He receives [place] monthly at [facility] urology. He receives Lupron every 4 months at [facility] urology.  Interim History:   Mr. Brosnahan returns today for a followup visit. He is accompanied by his son today. He feels that he tolerated the society it relatively well. He does complain of foot pain secondary to diabetic peripheral neuropathy. He voiced no other specific complaints. He is due for a refill for the Zytiga if he is to continue on this medication, although he is aware that his treatment might be changed. His appetite has improved a bit and his weight is now relatively stable. He was also noted to have decline in his memory as well. He has been on Zytiga since July of 2014 without any complications from the medication. He is not reporting any new bone pain at this time. He does have ongoing right knee and right shoulder pain due to  arthritis. This remains unchanged . He reports no lower extremity edema. No abdominal pain, nausea, vomiting. His performance status is unchanged. He reports no hospitalization or illnesses. Has not reported any of the fluid retention or any bone pain. He does report occasional incontinence of stool but no recent diarrhea. He does not report any headaches or blurry vision. He does not report any syncope or falls. He does not report chest pain, shortness of breath or difficulty breathing. Denied genitourinary bleeding or hemoptysis. Rest of his review of systems unremarkable.  Medications: I have reviewed the patient's current medications.  Current Outpatient Prescriptions  Medication Sig Dispense Refill  . ACCU-CHEK SOFTCLIX LANCETS lancets 1 each by Other route 2 (two) times daily as needed for other.  100 each  12  . acetaminophen (TYLENOL) 500 MG tablet Take 1,000 mg by mouth every 6 (six) hours as needed for pain.      [name] amLODipine-valsartan (EXFORGE) 10-320 MG per tablet Take 1 tablet by mouth daily.  90 tablet  1  . aspirin EC 81 MG tablet Take 81 mg by mouth every morning.       [name] atorvastatin (LIPITOR) 10 MG tablet TAKE ONE TABLET BY MOUTH ONCE DAILY  90 tablet  0  . calcium carbonate-magnesium hydroxide (ROLAIDS) 334 MG CHEW Chew 1 tablet by mouth 2 (two) times daily as needed (indigestion).       . Calcium Citrate-Vitamin D (CITRACAL + D PO) Take 1 tablet by mouth daily.      . Dapagliflozin Propanediol  10 MG TABS Take 1 tablet by mouth daily.  30 tablet  2  . Denosumab (XGEVA ) Inject into the skin every 30 (thirty) days. Injects "around the first part of each month."      . desmopressin (DDAVP) 0.2 MG tablet Take 0.4 mg by mouth at bedtime.       . donepezil (ARICEPT) 10 MG tablet TAKE 1 TABLET BY MOUTH ONCE DAILY  90 tablet  1  . glimepiride (AMARYL) 4 MG tablet TAKE 1 TABLET BY MOUTH DAILY BEFORE BREAKFAST  30 tablet  5  . glucose blood (ACCU-CHEK AVIVA PLUS) test strip 1 each by  Other route 2 (two) times daily as needed for other.  100 each  12  . leuprolide (LUPRON) 3.75 MG injection Inject 3.75 mg into the muscle every 4 (four) months.      . levothyroxine (SYNTHROID, LEVOTHROID) 50 MCG tablet TAKE 1 TABLET BY MOUTH EVERY DAY  90 tablet  1  . LYRICA 50 MG capsule TAKE 2 CAPSULES BY MOUTH EVERY MORNING AND 1 CAPSULE IN THE EVENING  90 capsule  5  . Memantine HCl ER (NAMENDA XR) 28 MG CP24 Take 28 mg by mouth daily.  30 capsule  6  . metFORMIN (GLUCOPHAGE) 500 MG tablet Take 500 mg by mouth 2 (two) times daily with a meal.      . Misc Natural Products (OSTEO BI-FLEX ADV DOUBLE ST) TABS Take by mouth.      . Nutritional Supplements (JUICE PLUS FIBRE PO) Take 6 capsules by mouth daily.       [name] omega-3 acid ethyl esters (LOVAZA) 1 G capsule Take 1 g by mouth once a week.       [name] oxybutynin (DITROPAN-XL) 10 MG 24 hr tablet Take 10 mg by mouth daily.       . potassium chloride SA (K-DUR,KLOR-CON) 20 MEQ tablet TAKE 1 TABLET BY MOUTH DAILY  30 tablet  0  . traMADol (ULTRAM) 50 MG tablet TAKE 1 TABLET BY MOUTH EVERY 8 HOURS AS NEEDED  90 tablet  2  . ZYTIGA 250 MG tablet TAKE 4 TABLETS BY MOUTH DAILY. TAKE ON AN EMPTY STOMACH 1 HOUR BEFORE OR 2 HOURS AFTER A MEAL  120 tablet  0  . docusate sodium (COLACE) 50 MG capsule Take 50-100 mg by mouth 2 (two) times daily.       . nitroGLYCERIN (NITROSTAT) 0.4 MG SL tablet Place 0.4 mg under the tongue every 5 (five) minutes as needed. For chest pain      . [DISCONTINUED] chlorthalidone (HYGROTON) 25 MG tablet Take 12.5 mg by mouth daily.       No current facility-administered medications for this visit.     Allergies:  Allergies  Allergen Reactions  . Codeine Phosphate Other (See Comments)    mouth sores    Past Medical History, Surgical history, Social history, and Family History were reviewed and updated.    Physical Exam: Blood pressure 178/68, pulse 62, temperature 98.2 F (36.8 C), temperature source Oral, resp. rate  18, height 5\' 7"  (1.702 m), weight 174 lb 8 oz (79.153 kg).  ECOG: 1  General appearance: alert and cooperative Head: Normocephalic, without obvious abnormality, atraumatic Neck: no adenopathy Lymph nodes: Cervical, supraclavicular, and axillary nodes normal. Heart:regular rate and rhythm, S1, S2 normal, no murmur, click, rub or gallop Lung:chest clear, no wheezing, rales, or dose to percussion Abdomen: soft, non-tender, without masses or organomegaly EXT:no erythema, induration, or nodules   Lab Results:  Lab Results  Component Value Date   WBC 4.7 01/05/2014   HGB 12.3* 01/05/2014   HCT 35.1* 01/05/2014   MCV 88.3 01/05/2014   PLT 165 01/05/2014     Chemistry      Component Value Date/Time   NA 137 01/05/2014 1147   NA 135 01/03/2014 1631   K 3.1* 01/05/2014 1147   K 3.3* 01/03/2014 1631   CL 100 01/03/2014 1631   CO2 25 01/05/2014 1147   CO2 28 01/03/2014 1631   BUN 23.4 01/05/2014 1147   BUN 21 01/03/2014 1631   CREATININE 1.5* 01/05/2014 1147   CREATININE 1.4 01/03/2014 1631   CREATININE 1.38* 02/11/2012 1614      Component Value Date/Time   CALCIUM 9.3 01/05/2014 1147   CALCIUM 9.2 01/03/2014 1631   ALKPHOS 77 01/05/2014 1147   ALKPHOS 54 05/07/2013 1522   AST 13 01/05/2014 1147   AST 22 05/07/2013 1522   ALT 16 01/05/2014 1147   ALT 20 05/07/2013 1522   BILITOT 0.93 01/05/2014 1147   BILITOT 0.7 05/07/2013 1522       Results for TAKODA, JANOWIAK (MRN [id number]) as of 12/08/2013 15:11  Ref. Range 09/14/2013 11:50 10/19/2013 11:14  PSA Latest Range: <=4.00 ng/mL 1.61 2.35   Impression and Plan: This is an 80 year old gentleman with the following issues: 1. 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 His initial diagnosis dates back to 2006 with PSA of 6.26 and a Gleason score of 7. He has been on Zytiga since July 2014 is tolerating it well. His PSA was up to 2.35 in April 2015 and the PSA from 12/08/2013 was increased again to 3.37. His bone scan from 10/22/2013 revealed increased focal  radiotracer uptake in the medial S. the right superior ilium most consistent with progressive metastatic disease. The metastatic lesions at T7 and 29 were considered stable. Per Dr. Hazeline Junker note on 12/08/2013 the plan would be to change the patient from [place] to [place]  if he continued to have a rise in his PSA. With the evidence for disease progression on the bone scan as well as rising PSA over the last several months, he will be changed to Xtandi 160 mg by mouth daily.  Risks and benefits of this drug were again discussed today in detail the patient and his family member.   2. Bony disease: He is receiving Xgeva at [facility] urology on a monthly basis.  3. Hormonal deprivation: He is receiving Lupron at [facility] urology every 4 months.  4. 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   5. Followup: He will return in 4-5 weeks.   Awilda Metro E 7/8/20152:30 PM   Patient seen and examined. He is doing well without any new complaints.    Exam: Alert, NAD  Heart regular rate, lungs are clear.  Abd: soft, non tender.  EX: no edema.    PSA results discussed with the patient personally today and showed increase up to 3.89.   Impression and plan: Options of treatment discussed today including continuing Zytiga vs writhing to [place] was discussed. Given his progression of disease we elected to switch to [facility].  Risks and benefits of this drug discussed again and he is willing to proceed.  All his questions answered today.   [facility] MD

## 2014-01-06 LAB — PSA: PSA: 3.89 ng/mL (ref <=4.00)

## 2014-01-06 NOTE — Patient Instructions (Signed)
The Fabio Asa has been discontinued secondary to evidence for disease progression. Start taking the Xtandi 160 mg by mouth daily Follow up with Dr. Alen Blew in approximately one month for a symptom management visit

## 2014-01-07 ENCOUNTER — Other Ambulatory Visit: Payer: Self-pay | Admitting: Oncology

## 2014-01-07 ENCOUNTER — Encounter: Payer: Self-pay | Admitting: Medical Oncology

## 2014-01-07 DIAGNOSIS — C61 Malignant neoplasm of prostate: Secondary | ICD-10-CM

## 2014-01-07 MED ORDER — ENZALUTAMIDE 40 MG PO CAPS: 160.0000 mg | ORAL_CAPSULE | Freq: Every day | ORAL | Status: DC

## 2014-01-07 NOTE — Progress Notes (Signed)
Xtandi prescription given to [name] [name] in managed care for preauthorization.

## 2014-01-10 ENCOUNTER — Telehealth: Payer: Self-pay | Admitting: Medical Oncology

## 2014-01-10 NOTE — Telephone Encounter (Signed)
[employer] called to inform office that prescription for xtandi 40 mg has been approved and "good for a full year." Elmo Putt, rep, states letter to patient and office to be sent out.  Mssg forwarded to managed care and MD.  appt 02/09/14 lab/MD

## 2014-01-22 ENCOUNTER — Other Ambulatory Visit: Payer: Self-pay | Admitting: Internal Medicine

## 2014-01-29 ENCOUNTER — Other Ambulatory Visit: Payer: Self-pay | Admitting: Oncology

## 2014-01-29 ENCOUNTER — Other Ambulatory Visit: Payer: Self-pay | Admitting: Internal Medicine

## 2014-02-05 ENCOUNTER — Other Ambulatory Visit: Payer: Self-pay | Admitting: Internal Medicine

## 2014-02-09 ENCOUNTER — Telehealth: Payer: Self-pay | Admitting: Oncology

## 2014-02-09 ENCOUNTER — Other Ambulatory Visit (HOSPITAL_BASED_OUTPATIENT_CLINIC_OR_DEPARTMENT_OTHER): Payer: Medicare Other

## 2014-02-09 ENCOUNTER — Encounter: Payer: Self-pay | Admitting: Oncology

## 2014-02-09 ENCOUNTER — Other Ambulatory Visit: Payer: Self-pay | Admitting: Medical Oncology

## 2014-02-09 ENCOUNTER — Ambulatory Visit (HOSPITAL_BASED_OUTPATIENT_CLINIC_OR_DEPARTMENT_OTHER): Payer: Medicare Other | Admitting: Oncology

## 2014-02-09 VITALS — BP 133/83 | HR 57 | Temp 97.8°F | Resp 18 | Ht 67.0 in | Wt 175.5 lb

## 2014-02-09 DIAGNOSIS — E876 Hypokalemia: Secondary | ICD-10-CM

## 2014-02-09 DIAGNOSIS — C7951 Secondary malignant neoplasm of bone: Secondary | ICD-10-CM

## 2014-02-09 DIAGNOSIS — C61 Malignant neoplasm of prostate: Secondary | ICD-10-CM

## 2014-02-09 DIAGNOSIS — E291 Testicular hypofunction: Secondary | ICD-10-CM

## 2014-02-09 DIAGNOSIS — C7952 Secondary malignant neoplasm of bone marrow: Secondary | ICD-10-CM

## 2014-02-09 LAB — CBC WITH DIFFERENTIAL/PLATELET
BASO%: 0.6 % (ref 0.0–2.0)
BASOS ABS: 0 10*3/uL (ref 0.0–0.1)
EOS%: 3.6 % (ref 0.0–7.0)
Eosinophils Absolute: 0.2 10*3/uL (ref 0.0–0.5)
HEMATOCRIT: 36.7 % — AB (ref 38.4–49.9)
HEMOGLOBIN: 12.4 g/dL — AB (ref 13.0–17.1)
LYMPH#: 1.2 10*3/uL (ref 0.9–3.3)
LYMPH%: 22.8 % (ref 14.0–49.0)
MCH: 30.8 pg (ref 27.2–33.4)
MCHC: 33.8 g/dL (ref 32.0–36.0)
MCV: 91.1 fL (ref 79.3–98.0)
MONO#: 0.4 10*3/uL (ref 0.1–0.9)
MONO%: 8.2 % (ref 0.0–14.0)
NEUT#: 3.4 10*3/uL (ref 1.5–6.5)
NEUT%: 64.8 % (ref 39.0–75.0)
Platelets: 194 10*3/uL (ref 140–400)
RBC: 4.03 10*6/uL — ABNORMAL LOW (ref 4.20–5.82)
RDW: 14.5 % (ref 11.0–14.6)
WBC: 5.3 10*3/uL (ref 4.0–10.3)

## 2014-02-09 LAB — COMPREHENSIVE METABOLIC PANEL (CC13)
ALBUMIN: 3.6 g/dL (ref 3.5–5.0)
ALT: 11 U/L (ref 0–55)
AST: 12 U/L (ref 5–34)
Alkaline Phosphatase: 73 U/L (ref 40–150)
Anion Gap: 11 mEq/L (ref 3–11)
BUN: 35.9 mg/dL — ABNORMAL HIGH (ref 7.0–26.0)
CALCIUM: 9.5 mg/dL (ref 8.4–10.4)
CHLORIDE: 101 meq/L (ref 98–109)
CO2: 20 mEq/L — ABNORMAL LOW (ref 22–29)
CREATININE: 1.4 mg/dL — AB (ref 0.7–1.3)
Glucose: 195 mg/dl — ABNORMAL HIGH (ref 70–140)
POTASSIUM: 5.2 meq/L — AB (ref 3.5–5.1)
Sodium: 132 mEq/L — ABNORMAL LOW (ref 136–145)
Total Bilirubin: 0.68 mg/dL (ref 0.20–1.20)
Total Protein: 6.8 g/dL (ref 6.4–8.3)

## 2014-02-09 MED ORDER — ENZALUTAMIDE 40 MG PO CAPS: 160.0000 mg | ORAL_CAPSULE | Freq: Every day | ORAL | Status: DC

## 2014-02-09 NOTE — Progress Notes (Signed)
Hematology and Oncology Follow Up Visit  [name] [name] [id number] 1929/07/13 80 y.o. 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 His initial diagnosis was in 2006 when he presented with a PSA of 6.26 the Gleason score 4+3 equals 7.  Prior Therapy:  He is status post definitive therapy with radiation and androgen deprivation completed in 2007. The patient developed recurrent disease in 2012 with a PSA rise up to 28 and T9 osseous metastasis and was treated with combined androgen deprivation with Lupron and Casodex. He had an excellent response with his PSA nadir down to 0.19. Most recently he developed a rise in his PSA up to 3.06 in July 2014 despite castrate levels of testosterone. His bone scan in July 2014 confirmed the presence of bony disease. Zytiga 1000 mg daily started in July 2014 till July 2015.   Current therapy:  Xtandi 160 mg daily started in July of 2015. He receives [place] monthly at [facility] urology. He receives Lupron every 4 months at [facility] u[facility].  Interim History:   [name] [name] returns today for a followup visit with his daughter. Since the last visit, he started [place] without any complications from the medication. He is not reporting any new bone pain at this time. He does have ongoing right knee and right shoulder pain due to arthritis. This is unchanged . He has reported that he twisted his left knee and have increased pain. He reports no lower extremity edema. No abdominal pain, nausea, vomiting. His performance status is unchanged. He reports no hospitalization or illnesses. Has not reported any of the fluid retention or any bone pain. He does report occasional incontinence of stool but no recent diarrhea. He does not report any headaches or blurry vision. He does not report any syncope or falls. He does not report  chest pain, shortness of breath or difficulty breathing. Did not report any genitourinary bleeding or hemoptysis. Rest of his review of systems unremarkable.  Medications: I have reviewed the patient's current medications.  Current Outpatient Prescriptions  Medication Sig Dispense Refill  . ACCU-CHEK SOFTCLIX LANCETS lancets 1 each by Other route 2 (two) times daily as needed for other.  100 each  12  . acetaminophen (TYLENOL) 500 MG tablet Take 1,000 mg by mouth every 6 (six) hours as needed for pain.      [name] amLODipine-valsartan (EXFORGE) 10-320 MG per tablet Take 1 tablet by mouth daily.  90 tablet  1  . aspirin EC 81 MG tablet Take 81 mg by mouth every morning.       [name] atorvastatin (LIPITOR) 10 MG tablet TAKE ONE TABLET BY MOUTH ONCE DAILY  90 tablet  0  . atorvastatin (LIPITOR) 10 MG tablet TAKE 1 TABLET BY MOUTH EVERY DAY  90 tablet  0  . calcium carbonate-magnesium hydroxide (ROLAIDS) 334 MG CHEW Chew 1 tablet by mouth 2 (two) times daily as needed (indigestion).       . Calcium Citrate-Vitamin D (CITRACAL + D PO) Take 1 tablet by mouth daily.      . Denosumab (XGEVA [place]) Inject into the skin every 30 (thirty) days. Injects "around the first part of each month."      . desmopressin (DDAVP) 0.2 MG tablet Take 0.4 mg by mouth at bedtime.       . docusate sodium (COLACE) 50 MG capsule Take 50-100 mg by mouth 2 (two)  times daily.       [name] donepezil (ARICEPT) 10 MG tablet TAKE 1 TABLET BY MOUTH ONCE DAILY  90 tablet  1  . enzalutamide (XTANDI) 40 MG capsule Take 4 capsules (160 mg total) by mouth daily.  120 capsule  0  . FARXIGA 10 MG TABS TAKE 1 TABLET BY MOUTH DAILY  30 tablet  2  . glimepiride (AMARYL) 4 MG tablet TAKE 1 TABLET BY MOUTH DAILY BEFORE BREAKFAST  30 tablet  5  . glucose blood (ACCU-CHEK AVIVA PLUS) test strip 1 each by Other route 2 (two) times daily as needed for other.  100 each  12  . leuprolide (LUPRON) 3.75 MG injection Inject 3.75 mg into the muscle every 4 (four) months.       . levothyroxine (SYNTHROID, LEVOTHROID) 50 MCG tablet TAKE 1 TABLET BY MOUTH EVERY DAY  90 tablet  1  . LYRICA 50 MG capsule TAKE 2 CAPSULES BY MOUTH EVERY MORNING AND 1 CAPSULE IN THE EVENING  90 capsule  5  . Memantine HCl ER (NAMENDA XR) 28 MG CP24 Take 28 mg by mouth daily.  30 capsule  6  . metFORMIN (GLUCOPHAGE) 500 MG tablet Take 500 mg by mouth 2 (two) times daily with a meal.      . Misc Natural Products (OSTEO BI-FLEX ADV DOUBLE ST) TABS Take by mouth.      . nitroGLYCERIN (NITROSTAT) 0.4 MG SL tablet Place 0.4 mg under the tongue every 5 (five) minutes as needed. For chest pain      . Nutritional Supplements (JUICE PLUS FIBRE PO) Take 6 capsules by mouth daily.       [name] omega-3 acid ethyl esters (LOVAZA) 1 G capsule Take 1 g by mouth once a week.       [name] oxybutynin (DITROPAN-XL) 10 MG 24 hr tablet Take 10 mg by mouth daily.       . potassium chloride SA (K-DUR,KLOR-CON) 20 MEQ tablet TAKE 1 TABLET BY MOUTH DAILY  30 tablet  0  . traMADol (ULTRAM) 50 MG tablet TAKE 1 TABLET BY MOUTH EVERY 8 HOURS AS NEEDED  90 tablet  2  . ZYTIGA 250 MG tablet TAKE 4 TABLETS BY MOUTH DAILY. TAKE ON AN EMPTY STOMACH 1 HOUR BEFORE OR 2 HOURS AFTER A MEAL  120 tablet  0  . [DISCONTINUED] chlorthalidone (HYGROTON) 25 MG tablet Take 12.5 mg by mouth daily.       No current facility-administered medications for this visit.     Allergies:  Allergies  Allergen Reactions  . Codeine Phosphate Other (See Comments)    mouth sores    Past Medical History, Surgical history, Social history, and Family History were reviewed and updated.    Physical Exam: Blood pressure 133/83, pulse 57, temperature 97.8 F (36.6 C), temperature source Oral, resp. rate 18, height 5\' 7"  (1.702 m), weight 175 lb 8 oz (79.606 kg), SpO2 100.00%. ECOG: 1 General appearance: alert and cooperative Head: Normocephalic, without obvious abnormality, atraumatic Neck: no adenopathy Lymph nodes: Cervical, supraclavicular, and  axillary nodes normal. Heart:regular rate and rhythm, S1, S2 normal, no murmur, click, rub or gallop Lung:chest clear, no wheezing, rales, or dose to percussion Abdomen: soft, non-tender, without masses or organomegaly EXT:no erythema, induration, or nodules   Lab Results: Lab Results  Component Value Date   WBC 5.3 02/09/2014   HGB 12.4* 02/09/2014   HCT 36.7* 02/09/2014   MCV 91.1 02/09/2014   PLT 194 02/09/2014  Chemistry      Component Value Date/Time   NA 137 01/05/2014 1147   NA 135 01/03/2014 1631   K 3.1* 01/05/2014 1147   K 3.3* 01/03/2014 1631   CL 100 01/03/2014 1631   CO2 25 01/05/2014 1147   CO2 28 01/03/2014 1631   BUN 23.4 01/05/2014 1147   BUN 21 01/03/2014 1631   CREATININE 1.5* 01/05/2014 1147   CREATININE 1.4 01/03/2014 1631   CREATININE 1.38* 02/11/2012 1614      Component Value Date/Time   CALCIUM 9.3 01/05/2014 1147   CALCIUM 9.2 01/03/2014 1631   ALKPHOS 77 01/05/2014 1147   ALKPHOS 54 05/07/2013 1522   AST 13 01/05/2014 1147   AST 22 05/07/2013 1522   ALT 16 01/05/2014 1147   ALT 20 05/07/2013 1522   BILITOT 0.93 01/05/2014 1147   BILITOT 0.7 05/07/2013 1522        Impression and Plan: This is an 80 year old gentleman with the following issues: 1. 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 His initial diagnosis dates back to 2006 with PSA of 6.26 and a Gleason score of 7. He progressed on [place] and currently on Xtandi. He is reporting no complication from this medication and willing to proceed for the next month. His PSA is currently pending but I anticipate him on this medication at least for the next 2 months.  2. Bony disease: He is receiving Xgeva at [facility] urology on a monthly basis.  3. Hormonal deprivation: He is receiving Lupron at [facility] urology every 4 months.  4. Hypokalemia: His potassium is currently pending and I anticipate that to improve off Zytiga.  5. Weight loss: This has improved and his weight have been up 1 pound every month for  the last 2 months.  6. Followup: He will return in 4-5 weeks.   SMOLMB,EMLJQ 8/12/20151:30 PM

## 2014-02-09 NOTE — Telephone Encounter (Signed)
Pt confirmed labs/ov per 08/12 POF, gave pt AVS....KJ °

## 2014-02-10 LAB — PSA: PSA: 4.21 ng/mL — ABNORMAL HIGH (ref <=4.00)

## 2014-02-14 ENCOUNTER — Ambulatory Visit: Payer: Medicare Other | Admitting: Nurse Practitioner

## 2014-02-17 ENCOUNTER — Telehealth: Payer: Self-pay | Admitting: Internal Medicine

## 2014-02-17 NOTE — Telephone Encounter (Signed)
Patient Information:  Caller Name: [name]  Phone: [phone]  Patient: [name] [name], [name] [name]  Gender: Male  DOB: Dec 17, 1929  Age: 80 Years  PCP: Bluford Kaufmann (Family Practice > 72yrs old)  Office Follow Up:  Does the office need to follow up with this patient?: No  Instructions For The Office: N/A  RN Note:  Daughter advised to take the pt. to [facility] in [place] for evaluation now.  Symptoms  Reason For Call & Symptoms: At the beach since 02/13/14. Became confused and off-balance. Blood sugar is 222mg /dl fasting this am (02/17/14).States has been off the [place] x 3 weeks when could not get it refilled at the pharmacy. No fever or UTI symptoms. Pt. is very weak. Vomiting began this morning(02/17/14). Diarrhea.  Reviewed Health History In EMR: Yes  Reviewed Medications In EMR: Yes  Reviewed Allergies In EMR: Yes  Reviewed Surgeries / Procedures: Yes  Date of Onset of Symptoms: 02/14/2014  Guideline(s) Used:  Confusion - Delirium  Disposition Per Guideline:   Go to ED Now  Reason For Disposition Reached:   Headache or vomiting  Advice Given:  Call Back If:  You (i.e., patient, family member) become worse.  Patient Will Follow Care Advice:  YES

## 2014-02-17 NOTE — Telephone Encounter (Signed)
Noted  

## 2014-02-18 ENCOUNTER — Encounter: Payer: Self-pay | Admitting: Oncology

## 2014-02-18 NOTE — Progress Notes (Signed)
F/U call to patient's daughter's email and informed her, Per Dr. Alen Blew, that patient is to hold from taking the [place] till he is discharge from hospital and symptoms have improved. Informed daughter as well that confusion and diarrhea is listed as one of the possible side effects to [place]. Daughter expressed thanks and gave verbal confirmation will hold Xtandi till patient is discharged from hospital and will call office to inform. No further questions at this time.

## 2014-02-21 ENCOUNTER — Ambulatory Visit (INDEPENDENT_AMBULATORY_CARE_PROVIDER_SITE_OTHER): Payer: Medicare Other | Admitting: Nurse Practitioner

## 2014-02-21 ENCOUNTER — Encounter: Payer: Self-pay | Admitting: Nurse Practitioner

## 2014-02-21 VITALS — BP 120/64 | HR 51 | Ht 68.0 in | Wt 169.8 lb

## 2014-02-21 DIAGNOSIS — E785 Hyperlipidemia, unspecified: Secondary | ICD-10-CM

## 2014-02-21 DIAGNOSIS — I25810 Atherosclerosis of coronary artery bypass graft(s) without angina pectoris: Secondary | ICD-10-CM

## 2014-02-21 DIAGNOSIS — I10 Essential (primary) hypertension: Secondary | ICD-10-CM

## 2014-02-21 NOTE — Progress Notes (Signed)
[name] [name] Date of Birth: 1929-09-28 Medical Record #[id number]  History of Present Illness: [name] [name] is seen back today for a 6 month check. Seen for Dr. Percival Spanish - former patient of Dr. Susa Simmonds. He is 80 years of age. Has known CAD with past CABG. Last cath in 2009 - medically managed. EF of 60%. Other issues include type 2 DM, HTN, HLD, hypothyroidism, castration resistant prostate cancer, prior resection of a gastrointestinal stromal tumor. Last Myoview in September of 2014 for atypical chest pain - this was normal. EF of 56% with no ischemia - did have inferior and apical defect suggestive of thinning and soft tissue attenuation - medical therapy continued. Over the last several years, he has had more issues with his memory.   Last seen here in February. Seemed to be doing ok.  Comes back today. Here with his daughter. Using a walker. Moving slow. Seems to be slowing down in a generalized fashion. It has been several years since I have seen him. Was actually just discharged yesterday from [name] - had N/V/diarrhea. Went to the ER and had hyponatremia. [name] and enzalutamide stopped. These were relatively new medicines. Amaryl was cut back as well. He was treated with fluid restriction. Sees PCP tomorrow. His cardiac status is stable. No chest pain. Not short of breath. Very inactive. Fall risk. Unsteady on his feet.   Current Outpatient Prescriptions  Medication Sig Dispense Refill  . acetaminophen (TYLENOL) 500 MG tablet Take 1,000 mg by mouth every 6 (six) hours as needed for pain.      [name] amLODipine-valsartan (EXFORGE) 10-320 MG per tablet Take 1 tablet by mouth daily.  90 tablet  1  . aspirin EC 81 MG tablet Take 81 mg by mouth every morning.       [name] atorvastatin (LIPITOR) 10 MG tablet TAKE ONE TABLET BY MOUTH ONCE DAILY  90 tablet  0  . calcium carbonate-magnesium hydroxide (ROLAIDS) 334 MG CHEW Chew 1 tablet by mouth 3 (three) times daily with meals.       . Calcium Citrate-Vitamin  D (CITRACAL + D PO) Take 1 tablet by mouth daily.      . Denosumab (XGEVA ) Inject into the skin every 30 (thirty) days. Injects "around the first part of each month."      . docusate sodium (COLACE) 50 MG capsule Take 50-100 mg by mouth 2 (two) times daily.       [name] donepezil (ARICEPT) 10 MG tablet TAKE 1 TABLET BY MOUTH ONCE DAILY  90 tablet  1  . glimepiride (AMARYL) 2 MG tablet Take 2 mg by mouth daily with breakfast.      . Glucosamine-Chondroitin (GLUCOSAMINE CHONDR COMPLEX) 500-400 MG CAPS Take by mouth daily.      [name] leuprolide (LUPRON) 3.75 MG injection Inject 3.75 mg into the muscle every 4 (four) months.      . levothyroxine (SYNTHROID, LEVOTHROID) 50 MCG tablet TAKE 1 TABLET BY MOUTH EVERY DAY  90 tablet  1  . LYRICA 50 MG capsule TAKE 2 CAPSULES BY MOUTH EVERY MORNING AND 1 CAPSULE IN THE EVENING  90 capsule  5  . Memantine HCl ER (NAMENDA XR) 28 MG CP24 Take 28 mg by mouth daily.  30 capsule  6  . metFORMIN (GLUCOPHAGE) 500 MG tablet Take 500 mg by mouth 2 (two) times daily with a meal.      . nitroGLYCERIN (NITROSTAT) 0.4 MG SL tablet Place 0.4 mg under the tongue every 5 (five) minutes as  needed. For chest pain      . Nutritional Supplements (JUICE PLUS FIBRE PO) Take 6 capsules by mouth daily.       [name] omega-3 acid ethyl esters (LOVAZA) 1 G capsule Take 1 g by mouth once a week.       [name] oxybutynin (DITROPAN-XL) 10 MG 24 hr tablet Take 10 mg by mouth daily.       . potassium chloride SA (K-DUR,KLOR-CON) 20 MEQ tablet TAKE 1 TABLET BY MOUTH DAILY  30 tablet  0  . traMADol (ULTRAM) 50 MG tablet TAKE 1 TABLET BY MOUTH EVERY 8 HOURS AS NEEDED  90 tablet  2  . atorvastatin (LIPITOR) 10 MG tablet TAKE 1 TABLET BY MOUTH EVERY DAY  90 tablet  0  . [DISCONTINUED] chlorthalidone (HYGROTON) 25 MG tablet Take 12.5 mg by mouth daily.       No current facility-administered medications for this visit.    Allergies  Allergen Reactions  . Codeine Phosphate Other (See Comments)    mouth sores      Past Medical History  Diagnosis Date  . Hypertension   . Hyperlipidemia   . Memory loss     mild  . Rectal ulcer     RESOLVED  . Constipation   . History of ventricular fibrillation 1998    while on treadmill  . Hypothyroidism   . IBS (irritable bowel syndrome)   . Colitis     RESOLVED  . ED (erectile dysfunction)   . Osteoporosis   . Depression   . Diabetes mellitus     fasting blood sugar 140-200 usually  . GERD (gastroesophageal reflux disease)   . Heart attack 2009  . Dementia   . Neuromuscular disorder     DIABETIC NEUROPATHY  . Prostate cancer   . Pain     LOWER BACK  -- PAST HX OF STEROID EPIDURALS FOR PAIN RELIEF  . 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 CAD (coronary artery disease)     a. S/P CABG x 5 1998; b.  S/P stent LCX 2009;  c.  LHC 5/09: RCA proximal 90%, 95%, distally occluded, pCFX stents patent, OM1 30%, small left atrial branch 95%, oLAD occluded, S-dRCA patent, L-LAD/Dx patent, EF 60%=> Med Rx  ;  d.  Lex MV 3/13:  EF 63%, no ischemia  e.  Lex Myoview 03/08/13:  EF 56%, inf and apical defect suggestive of thinning and soft tissue attenuation, no ischemia; low risk.      Past Surgical History  Procedure Laterality Date  . Coronary artery bypass graft  1998    x5, with a left internal mammary to the LAD and diagonal, vein graft to the OM1 and 2, and a vein graft to the right coronary artery  . Coronary stent placement  11/13/2007    LCX  . Trigger finger repaired  2009  . Laparoscopic gastrectomy N/A 08/18/2012    Procedure: Laparoscopic Partial Gastrectomy;  Surgeon: [name], MD;  Location: [facility] ORS;  Service: General;  Laterality: N/A;  Laparoscopic Partial Gastrectomy,    History  Smoking status  . Former Smoker  Smokeless tobacco  . Former Systems developer  . Quit date: 02/11/1948    History  Alcohol  Use No    Family History  Problem Relation Age of Onset  . Stroke Father   . Pneumonia Father   .  Colon cancer Neg Hx     Review of Systems: The review of systems is per the HPI.  All other systems were reviewed and are negative.  Physical Exam: BP 120/64  Pulse 51  Ht 5\' 8"  (1.727 m)  Wt 169 lb 12.8 oz (77.021 kg)  BMI 25.82 kg/m2  SpO2 99% Patient is very pleasant and in no acute distress. Skin is warm and dry. Color is normal.  HEENT is unremarkable. Normocephalic/atraumatic. PERRL. Sclera are nonicteric. Neck is supple. No masses. No JVD. Lungs are clear. Cardiac exam shows a regular rate and rhythm. Abdomen is soft. Extremities are without edema. Gait and ROM are intact. No gross neurologic deficits noted.  Wt Readings from Last 3 Encounters:  02/21/14 169 lb 12.8 oz (77.021 kg)  02/09/14 175 lb 8 oz (79.606 kg)  01/05/14 174 lb 8 oz (79.153 kg)    LABORATORY DATA/PROCEDURES:  Lab Results  Component Value Date   WBC 5.3 02/09/2014   HGB 12.4* 02/09/2014   HCT 36.7* 02/09/2014   PLT 194 02/09/2014   GLUCOSE 195* 02/09/2014   CHOL 128 03/29/2013   TRIG 133.0 03/29/2013   HDL 36.00* 03/29/2013   LDLCALC 65 03/29/2013   ALT 11 02/09/2014   AST 12 02/09/2014   NA 132* 02/09/2014   K 5.2* 02/09/2014   CL 100 01/03/2014   CREATININE 1.4* 02/09/2014   BUN 35.9* 02/09/2014   CO2 20* 02/09/2014   TSH 0.75 03/29/2013   PSA 4.21* 02/09/2014   INR 1.1 08/14/2007   HGBA1C 7.3* 01/03/2014    BNP (last 3 results) No results found for this basename: PROBNP,  in the last 8760 hours   Assessment / Plan: 1. CAD - last Myoview a year ago - managed medically -  He is stable from our standpoint. Has more medical issues at the present. Will see back in 6 months.   2. Prostate cancer  3. GIST with past resection  4. DM  5. Recent admission for hyponatremia. Seeing PCP tomorrow.   Patient is agreeable to this plan and will call if any problems develop in the interim.   Burtis Junes,  RN, [place] [address] [place], [place]  [id number] [phone]

## 2014-02-21 NOTE — Patient Instructions (Signed)
Stay on your current medicines  See Dr. Percival Spanish in 6 months  Call the [place] office at [phone] if you have any questions, problems or concerns.

## 2014-02-22 ENCOUNTER — Encounter: Payer: Self-pay | Admitting: Internal Medicine

## 2014-02-22 ENCOUNTER — Encounter: Payer: Self-pay | Admitting: *Deleted

## 2014-02-22 ENCOUNTER — Other Ambulatory Visit: Payer: Self-pay | Admitting: Internal Medicine

## 2014-02-22 ENCOUNTER — Ambulatory Visit (INDEPENDENT_AMBULATORY_CARE_PROVIDER_SITE_OTHER): Payer: Medicare Other | Admitting: Internal Medicine

## 2014-02-22 VITALS — BP 156/74 | HR 68 | Temp 97.9°F | Resp 20 | Ht 67.0 in | Wt 173.0 lb

## 2014-02-22 DIAGNOSIS — E871 Hypo-osmolality and hyponatremia: Secondary | ICD-10-CM

## 2014-02-22 DIAGNOSIS — E039 Hypothyroidism, unspecified: Secondary | ICD-10-CM

## 2014-02-22 DIAGNOSIS — I10 Essential (primary) hypertension: Secondary | ICD-10-CM

## 2014-02-22 DIAGNOSIS — C61 Malignant neoplasm of prostate: Secondary | ICD-10-CM

## 2014-02-22 DIAGNOSIS — E119 Type 2 diabetes mellitus without complications: Secondary | ICD-10-CM

## 2014-02-22 DIAGNOSIS — E785 Hyperlipidemia, unspecified: Secondary | ICD-10-CM

## 2014-02-22 LAB — BASIC METABOLIC PANEL
BUN: 27 mg/dL — AB (ref 6–23)
CO2: 26 mEq/L (ref 19–32)
Calcium: 10.1 mg/dL (ref 8.4–10.5)
Chloride: 101 mEq/L (ref 96–112)
Creatinine, Ser: 1.5 mg/dL (ref 0.4–1.5)
GFR: 48.4 mL/min — AB (ref >60.00)
Glucose, Bld: 285 mg/dL — ABNORMAL HIGH (ref 70–99)
POTASSIUM: 4.3 meq/L (ref 3.5–5.1)
Sodium: 137 mEq/L (ref 135–145)

## 2014-02-22 NOTE — Progress Notes (Signed)
Pre visit review using our clinic review tool, if applicable. No additional management support is needed unless otherwise documented below in the visit note. 

## 2014-02-22 NOTE — Progress Notes (Signed)
Subjective:    Patient ID: [name] [name], male    DOB: [birth year], 80 y.o.   MRN: [id number]  HPI  80 year old patient who has a history of metastatic prostate cancer.  He has diabetes Compcare by peripheral neuropathy moderate cognitive dysfunction.  He had previously been taking desmopressin for urinary frequency.  Metabolic studies 2 weeks ago revealed a mildly depressed serum sodium of 132.  He was at the beach recently and became quite weak which was associated with some nausea, vomiting, and diarrhea.  He was admitted to hospital for 4 days for treatment of significant hyponatremia.  He was discharged 2 days ago with a restricted fluid diet. He remains weak, but improved.  No further nausea, vomiting, or diarrhea.  Past Medical History  Diagnosis Date  . Hypertension   . Hyperlipidemia   . Memory loss     mild  . Rectal ulcer     RESOLVED  . Constipation   . History of ventricular fibrillation 1998    while on treadmill  . Hypothyroidism   . IBS (irritable bowel syndrome)   . Colitis     RESOLVED  . ED (erectile dysfunction)   . Osteoporosis   . Depression   . Diabetes mellitus     fasting blood sugar 140-200 usually  . GERD (gastroesophageal reflux disease)   . Heart attack 2009  . Dementia   . Neuromuscular disorder     DIABETIC NEUROPATHY  . Prostate cancer   . Pain     LOWER BACK  -- PAST HX OF STEROID EPIDURALS FOR PAIN RELIEF  . 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 CAD (coronary artery disease)     a. S/P CABG x 5 1998; b.  S/P stent LCX 2009;  c.  LHC 5/09: RCA proximal 90%, 95%, distally occluded, pCFX stents patent, OM1 30%, small left atrial branch 95%, oLAD occluded, S-dRCA patent, L-LAD/Dx patent, EF 60%=> Med Rx  ;  d.  Lex MV 3/13:  EF 63%, no ischemia  e.  Lex Myoview 03/08/13:  EF 56%, inf and apical defect  suggestive of thinning and soft tissue attenuation, no ischemia; low risk.      History   Social History  . Marital Status: Widowed    Spouse Name: N/A    Number of Children: 4  . Years of Education: N/A   Occupational History  . retired    Social History Main Topics  . Smoking status: Former Research scientist (life sciences)  . Smokeless tobacco: Former Systems developer    Quit date: 02/11/1948  . Alcohol Use: No  . Drug Use: No  . Sexual Activity: Not on file   Other Topics Concern  . Not on file   Social History Narrative  . No narrative on file    Past Surgical History  Procedure Laterality Date  . Coronary artery bypass graft  1998    x5, with a left internal mammary to the LAD and diagonal, vein graft to the OM1 and 2, and a vein graft to the right coronary artery  . Coronary stent placement  11/13/2007    LCX  . Trigger finger repaired  2009  . Laparoscopic gastrectomy N/A 08/18/2012    Procedure: Laparoscopic Partial Gastrectomy;  Surgeon: [name], MD;  Location: [facility];  Service: General;  Laterality: N/A;  Laparoscopic Partial Gastrectomy,    Family History  Problem Relation Age of Onset  . Stroke Father   . Pneumonia Father   . Colon cancer Neg Hx     Allergies  Allergen Reactions  . Codeine Phosphate Other (See Comments)    mouth sores    Current Outpatient Prescriptions on File Prior to Visit  Medication Sig Dispense Refill  . acetaminophen (TYLENOL) 500 MG tablet Take 1,000 mg by mouth every 6 (six) hours as needed for pain.      [name] amLODipine-valsartan (EXFORGE) 10-320 MG per tablet Take 1 tablet by mouth daily.  90 tablet  1  . aspirin EC 81 MG tablet Take 81 mg by mouth every morning.       [name] atorvastatin (LIPITOR) 10 MG tablet TAKE 1 TABLET BY MOUTH EVERY DAY  90 tablet  0  . calcium carbonate-magnesium hydroxide (ROLAIDS) 334 MG CHEW Chew 1 tablet by mouth 3 (three) times daily with meals.       . Calcium Citrate-Vitamin D (CITRACAL + D PO) Take 1 tablet by mouth daily.       . Denosumab (XGEVA [place]) Inject into the skin every 30 (thirty) days. Injects "around the first part of each month."      . docusate sodium (COLACE) 50 MG capsule Take 50-100 mg by mouth 2 (two) times daily.       [name] donepezil (ARICEPT) 10 MG tablet TAKE 1 TABLET BY MOUTH ONCE DAILY  90 tablet  1  . glimepiride (AMARYL) 2 MG tablet Take 2 mg by mouth daily with breakfast.      . Glucosamine-Chondroitin (GLUCOSAMINE CHONDR COMPLEX) 500-400 MG CAPS Take by mouth daily.      [name] leuprolide (LUPRON) 3.75 MG injection Inject 3.75 mg into the muscle every 4 (four) months.      . levothyroxine (SYNTHROID, LEVOTHROID) 50 MCG tablet TAKE 1 TABLET BY MOUTH EVERY DAY  90 tablet  1  . LYRICA 50 MG capsule TAKE 2 CAPSULES BY MOUTH EVERY MORNING AND 1 CAPSULE IN THE EVENING  90 capsule  5  . Memantine HCl ER (NAMENDA XR) 28 MG CP24 Take 28 mg by mouth daily.  30 capsule  6  . metFORMIN (GLUCOPHAGE) 500 MG tablet Take 500 mg by mouth 2 (two) times daily with a meal.      . nitroGLYCERIN (NITROSTAT) 0.4 MG SL tablet Place 0.4 mg under the tongue every 5 (five) minutes as needed. For chest pain      . Nutritional Supplements (JUICE PLUS FIBRE PO) Take 6 capsules by mouth daily.       [name] omega-3 acid ethyl esters (LOVAZA) 1 G capsule Take 1 g by mouth once a week.       [name] oxybutynin (DITROPAN-XL) 10 MG 24 hr tablet Take 10 mg by mouth daily.       . potassium chloride SA (K-DUR,KLOR-CON) 20 MEQ tablet TAKE 1 TABLET BY MOUTH DAILY  30 tablet  0  . traMADol (ULTRAM) 50 MG tablet TAKE 1 TABLET BY MOUTH EVERY 8 HOURS AS NEEDED  90 tablet  2  . [DISCONTINUED] chlorthalidone (HYGROTON) 25 MG tablet Take 12.5 mg by mouth daily.       No current facility-administered medications on file prior to visit.    BP 156/74  Pulse 68  Temp(Src) 97.9 F (36.6 C) (Oral)  Resp 20  Ht 5\' 7"  (1.702 m)  Wt 173 lb (78.472 kg)  BMI 27.09 kg/m2  SpO2 98%     .  Review of Systems  Constitutional: Positive for fatigue.  Negative for fever, chills and appetite change.  HENT: Negative for congestion, dental problem, ear pain, hearing loss, sore throat, tinnitus, trouble swallowing and voice change.   Eyes: Negative for pain, discharge and visual disturbance.  Respiratory: Negative for cough, chest tightness, wheezing and stridor.   Cardiovascular: Negative for chest pain, palpitations and leg swelling.  Gastrointestinal: Negative for nausea, vomiting, abdominal pain, diarrhea, constipation, blood in stool and abdominal distention.  Genitourinary: Negative for urgency, hematuria, flank pain, discharge, difficulty urinating and genital sores.  Musculoskeletal: Positive for back pain and gait problem. Negative for arthralgias, joint swelling, myalgias and neck stiffness.  Skin: Negative for rash.  Neurological: Positive for weakness. Negative for dizziness, syncope, speech difficulty, numbness and headaches.  Hematological: Negative for adenopathy. Does not bruise/bleed easily.  Psychiatric/Behavioral: Negative for behavioral problems and dysphoric mood. The patient is not nervous/anxious.        Objective:   Physical Exam  Constitutional: He is oriented to person, place, and time. He appears well-developed.  Blood pressure 130/70  HENT:  Head: Normocephalic.  Right Ear: External ear normal.  Left Ear: External ear normal.  Eyes: Conjunctivae and EOM are normal.  Neck: Normal range of motion.  Cardiovascular: Normal rate and normal heart sounds.   Pulmonary/Chest: Breath sounds normal.  Abdominal: Bowel sounds are normal.  Musculoskeletal: Normal range of motion. He exhibits no edema and no tenderness.  Neurological: He is alert and oriented to person, place, and time.  Psychiatric: He has a normal mood and affect. His behavior is normal.          Assessment & Plan:   History of hyponatremia.  Will check a bmet Diabetes mellitus  Hypertension  CAD  Cognitive impairment.  Stable Weakness  following hospital admission and metabolic encephalopathy.  Will slowly increase level of activity  Recheck one month  No change in present medications

## 2014-02-22 NOTE — Patient Instructions (Addendum)
Return in one month for follow-up  Slowly increase level of activity

## 2014-03-05 ENCOUNTER — Other Ambulatory Visit: Payer: Self-pay | Admitting: Oncology

## 2014-03-07 ENCOUNTER — Other Ambulatory Visit: Payer: Self-pay | Admitting: Oncology

## 2014-03-14 ENCOUNTER — Encounter: Payer: Self-pay | Admitting: Physician Assistant

## 2014-03-14 ENCOUNTER — Telehealth: Payer: Self-pay | Admitting: Oncology

## 2014-03-14 ENCOUNTER — Ambulatory Visit (HOSPITAL_BASED_OUTPATIENT_CLINIC_OR_DEPARTMENT_OTHER): Payer: Medicare Other | Admitting: Physician Assistant

## 2014-03-14 ENCOUNTER — Telehealth: Payer: Self-pay | Admitting: Internal Medicine

## 2014-03-14 ENCOUNTER — Other Ambulatory Visit (HOSPITAL_BASED_OUTPATIENT_CLINIC_OR_DEPARTMENT_OTHER): Payer: Medicare Other

## 2014-03-14 VITALS — BP 173/82 | HR 57 | Temp 97.6°F | Resp 17 | Ht 67.0 in | Wt 174.2 lb

## 2014-03-14 DIAGNOSIS — C61 Malignant neoplasm of prostate: Secondary | ICD-10-CM

## 2014-03-14 DIAGNOSIS — E876 Hypokalemia: Secondary | ICD-10-CM

## 2014-03-14 DIAGNOSIS — E291 Testicular hypofunction: Secondary | ICD-10-CM

## 2014-03-14 DIAGNOSIS — C7951 Secondary malignant neoplasm of bone: Secondary | ICD-10-CM

## 2014-03-14 DIAGNOSIS — C7952 Secondary malignant neoplasm of bone marrow: Secondary | ICD-10-CM

## 2014-03-14 LAB — COMPREHENSIVE METABOLIC PANEL (CC13)
ALBUMIN: 3.4 g/dL — AB (ref 3.5–5.0)
ALK PHOS: 83 U/L (ref 40–150)
ALT: 10 U/L (ref 0–55)
AST: 12 U/L (ref 5–34)
Anion Gap: 9 mEq/L (ref 3–11)
BILIRUBIN TOTAL: 0.43 mg/dL (ref 0.20–1.20)
BUN: 26.2 mg/dL — AB (ref 7.0–26.0)
CO2: 24 mEq/L (ref 22–29)
CREATININE: 1.5 mg/dL — AB (ref 0.7–1.3)
Calcium: 9.4 mg/dL (ref 8.4–10.4)
Chloride: 108 mEq/L (ref 98–109)
GLUCOSE: 225 mg/dL — AB (ref 70–140)
POTASSIUM: 4.4 meq/L (ref 3.5–5.1)
Sodium: 141 mEq/L (ref 136–145)
Total Protein: 6.6 g/dL (ref 6.4–8.3)

## 2014-03-14 LAB — CBC WITH DIFFERENTIAL/PLATELET
BASO%: 0.7 % (ref 0.0–2.0)
BASOS ABS: 0 10*3/uL (ref 0.0–0.1)
EOS ABS: 0.2 10*3/uL (ref 0.0–0.5)
EOS%: 5.3 % (ref 0.0–7.0)
HEMATOCRIT: 34 % — AB (ref 38.4–49.9)
HEMOGLOBIN: 11.5 g/dL — AB (ref 13.0–17.1)
LYMPH%: 21.7 % (ref 14.0–49.0)
MCH: 31.6 pg (ref 27.2–33.4)
MCHC: 33.9 g/dL (ref 32.0–36.0)
MCV: 93 fL (ref 79.3–98.0)
MONO#: 0.4 10*3/uL (ref 0.1–0.9)
MONO%: 9.3 % (ref 0.0–14.0)
NEUT%: 63 % (ref 39.0–75.0)
NEUTROS ABS: 2.9 10*3/uL (ref 1.5–6.5)
PLATELETS: 163 10*3/uL (ref 140–400)
RBC: 3.66 10*6/uL — ABNORMAL LOW (ref 4.20–5.82)
RDW: 15.2 % — ABNORMAL HIGH (ref 11.0–14.6)
WBC: 4.6 10*3/uL (ref 4.0–10.3)
lymph#: 1 10*3/uL (ref 0.9–3.3)

## 2014-03-14 MED ORDER — AMLODIPINE BESYLATE-VALSARTAN 10-320 MG PO TABS: ORAL_TABLET | ORAL | Status: DC

## 2014-03-14 NOTE — Telephone Encounter (Signed)
Pt's daughter notified Rx sent to pharmacy.

## 2014-03-14 NOTE — Patient Instructions (Signed)
Resume Xtandi 160 mg by mouth daily with your current supply Return in approximately 3 weeks for repeat laboratory tests including a repeat PSA Followup with Dr. Alen Blew in 4 weeks

## 2014-03-14 NOTE — Progress Notes (Signed)
Hematology and Oncology Follow Up Visit  [name] [name] [id number] 05-Jun-1930 80 y.o. 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 His initial diagnosis was in 2006 when he presented with a PSA of 6.26 the Gleason score 4+3 equals 7.  Prior Therapy:  He is status post definitive therapy with radiation and androgen deprivation completed in 2007. The patient developed recurrent disease in 2012 with a PSA rise up to 28 and T9 osseous metastasis and was treated with combined androgen deprivation with Lupron and Casodex. He had an excellent response with his PSA nadir down to 0.19. Most recently he developed a rise in his PSA up to 3.06 in July 2014 despite castrate levels of testosterone. His bone scan in July 2014 confirmed the presence of bony disease. Zytiga 1000 mg daily started in July 2014 till July 2015.   Current therapy:  Xtandi 160 mg daily started in July of 2015. He receives [place] monthly at [facility] urology. He receives Lupron every 4 months at [facility] urology.  Interim History:   [name] [name] returns today for a followup visit with his daughter. Since the last visit, he was hospitalized while visiting the beach in [place] on 02/17/2014 approximately 4 days. He was admitted with hyponatremia and weakness as well as nausea and vomiting. His [place] Phy was held and he has remained off of the [place] since being discharged from the hospital. His daughter believes that he has approximately 3 weeks +2 or 3 days worth of medication remaining. Since being discharged from the hospital he feels better but still has some weakness. There is still some element of confusion/increased forgetfulness. His Exforge has run out, last dose taken with 03/03/2014. They forgot to mention this to his primary care physician when he was seen recently.  He'll contact his office for a refill of this medication to treat his blood pressure. He does have some issues with constipation and diarrhea. Take something for the constipation he tends and that the diarrhea that he takes something to stop the diarrhea and then ends up constipated. We'll try to get him to a more middle ground so that he doesn't end up suffering from 1 extreme of the spectrum to the other. He is not reporting any new bone pain at this time. He does have ongoing right knee and right shoulder pain due to arthritis. This is unchanged .  No abdominal pain, nausea, vomiting. His performance status is unchanged. Has not reported any of the fluid retention or any bone pain.  He does not report any headaches or blurry vision. He does not report any syncope or falls. He does not report chest pain, shortness of breath or difficulty breathing. Did not report any genitourinary bleeding or hemoptysis. Remainder of his review of systems unremarkable.  Medications: I have reviewed the patient's current medications.  Current Outpatient Prescriptions  Medication Sig Dispense Refill  . acetaminophen (TYLENOL) 500 MG tablet Take 1,000 mg by mouth every 6 (six) hours as needed for pain.      [name] aspirin EC 81 MG tablet Take 81 mg by mouth every morning.       [name] atorvastatin (LIPITOR) 10 MG tablet TAKE 1 TABLET BY MOUTH EVERY DAY  90 tablet  0  . calcium carbonate-magnesium hydroxide (ROLAIDS) 334 MG CHEW Chew 1 tablet by mouth 3 (three) times daily with meals.       [name]  Calcium Citrate-Vitamin D (CITRACAL + D PO) Take 1 tablet by mouth daily.      . Denosumab (XGEVA [place]) Inject into the skin every 30 (thirty) days. Injects "around the first part of each month."      . donepezil (ARICEPT) 10 MG tablet TAKE 1 TABLET BY MOUTH ONCE DAILY  90 tablet  1  . glimepiride (AMARYL) 2 MG tablet Take 2 mg by mouth daily with breakfast.      . Glucosamine-Chondroitin (GLUCOSAMINE CHONDR COMPLEX) 500-400 MG CAPS Take by  mouth daily.      [name] leuprolide (LUPRON) 3.75 MG injection Inject 3.75 mg into the muscle every 4 (four) months.      . levothyroxine (SYNTHROID, LEVOTHROID) 50 MCG tablet TAKE 1 TABLET BY MOUTH EVERY DAY  90 tablet  1  . LYRICA 50 MG capsule TAKE 2 CAPSULES BY MOUTH EVERY MORNING AND 1 CAPSULE IN THE EVENING  90 capsule  5  . Memantine HCl ER (NAMENDA XR) 28 MG CP24 Take 28 mg by mouth daily.  30 capsule  6  . metFORMIN (GLUCOPHAGE) 500 MG tablet Take 500 mg by mouth 2 (two) times daily with a meal.      . Nutritional Supplements (JUICE PLUS FIBRE PO) Take 6 capsules by mouth daily.       [name] omega-3 acid ethyl esters (LOVAZA) 1 G capsule Take 1 g by mouth once a week.       [name] oxybutynin (DITROPAN-XL) 10 MG 24 hr tablet Take 10 mg by mouth daily.       . traMADol (ULTRAM) 50 MG tablet TAKE 1 TABLET BY MOUTH EVERY 8 HOURS AS NEEDED  90 tablet  2  . amLODipine-valsartan (EXFORGE) 10-320 MG per tablet TAKE 1 TABLET BY MOUTH EVERY DAY  90 tablet  1  . docusate sodium (COLACE) 50 MG capsule Take 50-100 mg by mouth 2 (two) times daily.       . nitroGLYCERIN (NITROSTAT) 0.4 MG SL tablet Place 0.4 mg under the tongue every 5 (five) minutes as needed. For chest pain      . potassium chloride SA (K-DUR,KLOR-CON) 20 MEQ tablet TAKE 1 TABLET BY MOUTH DAILY  90 tablet  0  . [DISCONTINUED] chlorthalidone (HYGROTON) 25 MG tablet Take 12.5 mg by mouth daily.       No current facility-administered medications for this visit.     Allergies:  Allergies  Allergen Reactions  . Codeine Phosphate Other (See Comments)    mouth sores    Past Medical History, Surgical history, Social history, and Family History were reviewed and updated.    Physical Exam: Blood pressure 173/82, pulse 57, temperature 97.6 F (36.4 C), temperature source Oral, resp. rate 17, height 5\' 7"  (1.702 m), weight 174 lb 3.2 oz (79.017 kg), SpO2 100.00%. ECOG: 1 General appearance: alert and cooperative Head: Normocephalic, without  obvious abnormality, atraumatic Neck: no adenopathy Lymph nodes: Cervical, supraclavicular, and axillary nodes normal. Heart:regular rate and rhythm, S1, S2 normal, no murmur, click, rub or gallop Lung:chest clear, no wheezing, rales, or dose to percussion Abdomen: soft, non-tender, without masses or organomegaly EXT:no erythema, induration, or nodules   Lab Results: Lab Results  Component Value Date   WBC 4.6 03/14/2014   HGB 11.5* 03/14/2014   HCT 34.0* 03/14/2014   MCV 93.0 03/14/2014   PLT 163 03/14/2014     Chemistry      Component Value Date/Time   NA 141 03/14/2014 1344   NA 137 02/22/2014 1026  K 4.4 03/14/2014 1344   K 4.3 02/22/2014 1026   CL 101 02/22/2014 1026   CO2 24 03/14/2014 1344   CO2 26 02/22/2014 1026   BUN 26.2* 03/14/2014 1344   BUN 27* 02/22/2014 1026   CREATININE 1.5* 03/14/2014 1344   CREATININE 1.5 02/22/2014 1026   CREATININE 1.38* 02/11/2012 1614      Component Value Date/Time   CALCIUM 9.4 03/14/2014 1344   CALCIUM 10.1 02/22/2014 1026   ALKPHOS 83 03/14/2014 1344   ALKPHOS 54 05/07/2013 1522   AST 12 03/14/2014 1344   AST 22 05/07/2013 1522   ALT 10 03/14/2014 1344   ALT 20 05/07/2013 1522   BILITOT 0.43 03/14/2014 1344   BILITOT 0.7 05/07/2013 1522        Impression and Plan: This is an 80 year old gentleman with the following issues: 1. 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 His initial diagnosis dates back to 2006 with PSA of 6.26 and a Gleason score of 7. He progressed on [place] and currently on Xtandi. He is reporting no complication from this medication and willing to proceed for the next month. His PSA from today is currently pending. The PSA from 02/09/2014 was 4.21 which was slightly increased from the previous level. Have asked him to resume the extent the at the current dose with his remaining supply. He is to be seen by Dr. Alen Blew prior to ordering his next month supply of this medication.  2. Bony disease: He is receiving  Xgeva at [facility] urology on a monthly basis.  3. Hormonal deprivation: He is receiving Lupron at [facility] urology every 4 months.  4. Hypokalemia: Resolved. His potassium 4.4, and I have asked him to hold his daily potassium supplementation until further notice.   5. Weight loss: This has improved and his weight is up another 1.2 pounds from last month.  6. Followup: He will return in 4-5 weeks. We will obtain labs a few days prior to this next appointment so that the results are available for Dr. Hazeline Junker review.   Carlton Adam, PA-C 9/14/20153:54 PM

## 2014-03-14 NOTE — Telephone Encounter (Signed)
Pt needs refill on exforge 10-320 mg call into [facility] [address]. Pharm never received request in aug 2015

## 2014-03-14 NOTE — Telephone Encounter (Signed)
gv and printed appt sched and avs for pt for OCT. °

## 2014-03-15 LAB — PSA: PSA: 5.58 ng/mL — AB (ref <=4.00)

## 2014-03-16 ENCOUNTER — Ambulatory Visit: Payer: Medicare Other | Admitting: Oncology

## 2014-03-16 ENCOUNTER — Other Ambulatory Visit: Payer: Medicare Other

## 2014-03-18 ENCOUNTER — Encounter: Payer: Self-pay | Admitting: *Deleted

## 2014-03-18 ENCOUNTER — Encounter: Payer: Self-pay | Admitting: Oncology

## 2014-03-18 NOTE — Progress Notes (Signed)
Patient and daughter unable to see labs in  My chart. Mailed copy to patient's home.

## 2014-04-02 ENCOUNTER — Other Ambulatory Visit: Payer: Self-pay | Admitting: Internal Medicine

## 2014-04-05 ENCOUNTER — Encounter: Payer: Self-pay | Admitting: Internal Medicine

## 2014-04-05 ENCOUNTER — Ambulatory Visit (INDEPENDENT_AMBULATORY_CARE_PROVIDER_SITE_OTHER): Payer: Medicare Other | Admitting: Internal Medicine

## 2014-04-05 ENCOUNTER — Other Ambulatory Visit: Payer: Self-pay | Admitting: *Deleted

## 2014-04-05 VITALS — BP 140/70 | HR 46 | Temp 97.4°F | Resp 20 | Ht 67.0 in | Wt 176.0 lb

## 2014-04-05 DIAGNOSIS — I10 Essential (primary) hypertension: Secondary | ICD-10-CM

## 2014-04-05 DIAGNOSIS — C61 Malignant neoplasm of prostate: Secondary | ICD-10-CM

## 2014-04-05 DIAGNOSIS — R5382 Chronic fatigue, unspecified: Secondary | ICD-10-CM

## 2014-04-05 DIAGNOSIS — Z23 Encounter for immunization: Secondary | ICD-10-CM

## 2014-04-05 DIAGNOSIS — E0842 Diabetes mellitus due to underlying condition with diabetic polyneuropathy: Secondary | ICD-10-CM

## 2014-04-05 LAB — HEMOGLOBIN A1C: HEMOGLOBIN A1C: 8.3 % — AB (ref 4.6–6.5)

## 2014-04-05 MED ORDER — ENZALUTAMIDE 40 MG PO CAPS: 160.0000 mg | ORAL_CAPSULE | Freq: Every day | ORAL | Status: DC

## 2014-04-05 NOTE — Patient Instructions (Signed)
Limit your sodium (Salt) intake   Please check your hemoglobin A1c every 3 months   

## 2014-04-05 NOTE — Progress Notes (Signed)
Subjective:    Patient ID: [name] [name], male    DOB: 03/11/1930, 80 y.o.   MRN: [id number]  HPI   80 year old patient who is in today for followup of diabetes compensated by peripheral neuropathy.  He is followed closely by oncology did 2 metastatic prostate cancer.  He has hypertension, CAD. Complaints today include generalized fatigue and abnormal gait.  He is on Lyrica, which has been helpful. Last hemoglobin A1c 7 point 3.  He says blood sugars are generally over 200  Wt Readings from Last 3 Encounters:  04/05/14 176 lb (79.833 kg)  03/14/14 174 lb 3.2 oz (79.017 kg)  02/22/14 173 lb (78.472 kg)    Past Medical History  Diagnosis Date  . Hypertension   . Hyperlipidemia   . Memory loss     mild  . Rectal ulcer     RESOLVED  . Constipation   . History of ventricular fibrillation 1998    while on treadmill  . Hypothyroidism   . IBS (irritable bowel syndrome)   . Colitis     RESOLVED  . ED (erectile dysfunction)   . Osteoporosis   . Depression   . Diabetes mellitus     fasting blood sugar 140-200 usually  . GERD (gastroesophageal reflux disease)   . Heart attack 2009  . Dementia   . Neuromuscular disorder     DIABETIC NEUROPATHY  . Prostate cancer   . Pain     LOWER BACK  -- PAST HX OF STEROID EPIDURALS FOR PAIN RELIEF  . 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 CAD (coronary artery disease)     a. S/P CABG x 5 1998; b.  S/P stent LCX 2009;  c.  LHC 5/09: RCA proximal 90%, 95%, distally occluded, pCFX stents patent, OM1 30%, small left atrial branch 95%, oLAD occluded, S-dRCA patent, L-LAD/Dx patent, EF 60%=> Med Rx  ;  d.  Lex MV 3/13:  EF 63%, no ischemia  e.  Lex Myoview 03/08/13:  EF 56%, inf and apical defect suggestive of thinning and soft tissue attenuation, no ischemia; low risk.      History   Social History  .  Marital Status: Widowed    Spouse Name: N/A    Number of Children: 4  . Years of Education: N/A   Occupational History  . retired    Social History Main Topics  . Smoking status: Former Research scientist (life sciences)  . Smokeless tobacco: Former Systems developer    Quit date: 02/11/1948  . Alcohol Use: No  . Drug Use: No  . Sexual Activity: Not on file   Other Topics Concern  . Not on file   Social History Narrative  . No narrative on file    Past Surgical History  Procedure Laterality Date  . Coronary artery bypass graft  1998    x5, with a left internal mammary to the LAD and diagonal, vein graft to the OM1 and 2, and a vein graft to the right coronary artery  . Coronary stent placement  11/13/2007    LCX  . Trigger finger repaired  2009  . Laparoscopic gastrectomy N/A 08/18/2012    Procedure: Laparoscopic Partial Gastrectomy;  Surgeon: [name], MD;  Location: [facility];  Service: General;  Laterality: N/A;  Laparoscopic Partial Gastrectomy,    Family History  Problem Relation Age of Onset  . Stroke Father   .  Pneumonia Father   . Colon cancer Neg Hx     Allergies  Allergen Reactions  . Codeine Phosphate Other (See Comments)    mouth sores    Current Outpatient Prescriptions on File Prior to Visit  Medication Sig Dispense Refill  . acetaminophen (TYLENOL) 500 MG tablet Take 1,000 mg by mouth every 6 (six) hours as needed for pain.      [name] amLODipine-valsartan (EXFORGE) 10-320 MG per tablet TAKE 1 TABLET BY MOUTH EVERY DAY  90 tablet  1  . aspirin EC 81 MG tablet Take 81 mg by mouth every morning.       [name] atorvastatin (LIPITOR) 10 MG tablet TAKE 1 TABLET BY MOUTH EVERY DAY  90 tablet  0  . calcium carbonate-magnesium hydroxide (ROLAIDS) 334 MG CHEW Chew 1 tablet by mouth 3 (three) times daily with meals.       . Calcium Citrate-Vitamin D (CITRACAL + D PO) Take 1 tablet by mouth daily.      . Denosumab (XGEVA [place]) Inject into the skin every 30 (thirty) days. Injects "around the first part of each  month."      . docusate sodium (COLACE) 50 MG capsule Take 50-100 mg by mouth 2 (two) times daily.       [name] donepezil (ARICEPT) 10 MG tablet TAKE 1 TABLET BY MOUTH ONCE DAILY  90 tablet  1  . glimepiride (AMARYL) 2 MG tablet Take 2 mg by mouth daily with breakfast.      . Glucosamine-Chondroitin (GLUCOSAMINE CHONDR COMPLEX) 500-400 MG CAPS Take by mouth daily.      [name] leuprolide (LUPRON) 3.75 MG injection Inject 3.75 mg into the muscle every 4 (four) months.      . levothyroxine (SYNTHROID, LEVOTHROID) 50 MCG tablet TAKE 1 TABLET BY MOUTH EVERY DAY  90 tablet  1  . LYRICA 50 MG capsule TAKE 2 CAPSULES BY MOUTH EVERY MORNING AND 1 CAPSULE IN THE EVENING  90 capsule  5  . metFORMIN (GLUCOPHAGE) 500 MG tablet Take 500 mg by mouth 2 (two) times daily with a meal.      . NAMENDA XR 28 MG CP24 TAKE 1 CAPSULE BY MOUTH DAILY  30 capsule  5  . nitroGLYCERIN (NITROSTAT) 0.4 MG SL tablet Place 0.4 mg under the tongue every 5 (five) minutes as needed. For chest pain      . Nutritional Supplements (JUICE PLUS FIBRE PO) Take 6 capsules by mouth daily.       [name] omega-3 acid ethyl esters (LOVAZA) 1 G capsule Take 1 g by mouth once a week.       [name] oxybutynin (DITROPAN-XL) 10 MG 24 hr tablet Take 10 mg by mouth daily.       . potassium chloride SA (K-DUR,KLOR-CON) 20 MEQ tablet TAKE 1 TABLET BY MOUTH DAILY  90 tablet  0  . traMADol (ULTRAM) 50 MG tablet TAKE 1 TABLET BY MOUTH EVERY 8 HOURS AS NEEDED  90 tablet  2  . [DISCONTINUED] chlorthalidone (HYGROTON) 25 MG tablet Take 12.5 mg by mouth daily.       No current facility-administered medications on file prior to visit.    BP 140/70  Pulse 46  Temp(Src) 97.4 F (36.3 C) (Oral)  Resp 20  Ht 5\' 7"  (1.702 m)  Wt 176 lb (79.833 kg)  BMI 27.56 kg/m2  SpO2 98%    Review of Systems  Constitutional: Positive for activity change, appetite change and fatigue. Negative for fever and chills.  HENT:  Negative for congestion, dental problem, ear pain, hearing loss,  sore throat, tinnitus, trouble swallowing and voice change.   Eyes: Negative for pain, discharge and visual disturbance.  Respiratory: Negative for cough, chest tightness, wheezing and stridor.   Cardiovascular: Negative for chest pain, palpitations and leg swelling.  Gastrointestinal: Negative for nausea, vomiting, abdominal pain, diarrhea, constipation, blood in stool and abdominal distention.  Genitourinary: Negative for urgency, hematuria, flank pain, discharge, difficulty urinating and genital sores.  Musculoskeletal: Positive for gait problem. Negative for arthralgias, back pain, joint swelling, myalgias and neck stiffness.  Skin: Negative for rash.  Neurological: Positive for weakness and numbness. Negative for dizziness, syncope, speech difficulty and headaches.  Hematological: Negative for adenopathy. Does not bruise/bleed easily.  Psychiatric/Behavioral: Negative for behavioral problems and dysphoric mood. The patient is not nervous/anxious.        Objective:   Physical Exam  Constitutional: He is oriented to person, place, and time. He appears well-developed.  Frail Unsteady gait Blood pressure 130/70  HENT:  Head: Normocephalic.  Right Ear: External ear normal.  Left Ear: External ear normal.  Eyes: Conjunctivae and EOM are normal.  Neck: Normal range of motion.  Cardiovascular: Normal rate and normal heart sounds.   Pulmonary/Chest: Breath sounds normal.  Abdominal: Bowel sounds are normal.  Musculoskeletal: Normal range of motion. He exhibits no edema and no tenderness.  Neurological: He is alert and oriented to person, place, and time.  Psychiatric: He has a normal mood and affect. His behavior is normal.          Assessment & Plan:   Diabetes mellitus.  We'll check a hemoglobin A1c Hypertension stable Gait abnormality/diabetic PN

## 2014-04-05 NOTE — Progress Notes (Signed)
Pre visit review using our clinic review tool, if applicable. No additional management support is needed unless otherwise documented below in the visit note. 

## 2014-04-06 ENCOUNTER — Other Ambulatory Visit: Payer: Self-pay | Admitting: *Deleted

## 2014-04-06 MED ORDER — PIOGLITAZONE HCL 15 MG PO TABS: 15.0000 mg | ORAL_TABLET | Freq: Every day | ORAL | Status: DC

## 2014-04-11 ENCOUNTER — Other Ambulatory Visit (HOSPITAL_BASED_OUTPATIENT_CLINIC_OR_DEPARTMENT_OTHER): Payer: Medicare Other

## 2014-04-11 DIAGNOSIS — C7951 Secondary malignant neoplasm of bone: Secondary | ICD-10-CM

## 2014-04-11 DIAGNOSIS — C61 Malignant neoplasm of prostate: Secondary | ICD-10-CM

## 2014-04-11 LAB — COMPREHENSIVE METABOLIC PANEL (CC13)
ALBUMIN: 3.6 g/dL (ref 3.5–5.0)
ALT: 12 U/L (ref 0–55)
ANION GAP: 11 meq/L (ref 3–11)
AST: 12 U/L (ref 5–34)
Alkaline Phosphatase: 77 U/L (ref 40–150)
BILIRUBIN TOTAL: 0.48 mg/dL (ref 0.20–1.20)
BUN: 29.8 mg/dL — ABNORMAL HIGH (ref 7.0–26.0)
CO2: 24 meq/L (ref 22–29)
Calcium: 9.8 mg/dL (ref 8.4–10.4)
Chloride: 104 mEq/L (ref 98–109)
Creatinine: 1.7 mg/dL — ABNORMAL HIGH (ref 0.7–1.3)
Glucose: 284 mg/dl — ABNORMAL HIGH (ref 70–140)
POTASSIUM: 4.7 meq/L (ref 3.5–5.1)
SODIUM: 139 meq/L (ref 136–145)
TOTAL PROTEIN: 6.8 g/dL (ref 6.4–8.3)

## 2014-04-11 LAB — CBC WITH DIFFERENTIAL/PLATELET
BASO%: 0.6 % (ref 0.0–2.0)
Basophils Absolute: 0 10*3/uL (ref 0.0–0.1)
EOS ABS: 0.3 10*3/uL (ref 0.0–0.5)
EOS%: 5.7 % (ref 0.0–7.0)
HCT: 36.3 % — ABNORMAL LOW (ref 38.4–49.9)
HGB: 12 g/dL — ABNORMAL LOW (ref 13.0–17.1)
LYMPH%: 19.9 % (ref 14.0–49.0)
MCH: 31.2 pg (ref 27.2–33.4)
MCHC: 33.2 g/dL (ref 32.0–36.0)
MCV: 94 fL (ref 79.3–98.0)
MONO#: 0.3 10*3/uL (ref 0.1–0.9)
MONO%: 7.4 % (ref 0.0–14.0)
NEUT#: 2.9 10*3/uL (ref 1.5–6.5)
NEUT%: 66.4 % (ref 39.0–75.0)
PLATELETS: 177 10*3/uL (ref 140–400)
RBC: 3.86 10*6/uL — AB (ref 4.20–5.82)
RDW: 14.2 % (ref 11.0–14.6)
WBC: 4.4 10*3/uL (ref 4.0–10.3)
lymph#: 0.9 10*3/uL (ref 0.9–3.3)

## 2014-04-12 LAB — PSA: PSA: 6.13 ng/mL — ABNORMAL HIGH (ref <=4.00)

## 2014-04-13 ENCOUNTER — Encounter: Payer: Self-pay | Admitting: Oncology

## 2014-04-13 ENCOUNTER — Telehealth: Payer: Self-pay | Admitting: Oncology

## 2014-04-13 ENCOUNTER — Ambulatory Visit (HOSPITAL_BASED_OUTPATIENT_CLINIC_OR_DEPARTMENT_OTHER): Payer: Medicare Other | Admitting: Oncology

## 2014-04-13 VITALS — BP 146/56 | HR 54 | Temp 97.5°F | Resp 19 | Ht 67.0 in | Wt 178.8 lb

## 2014-04-13 DIAGNOSIS — R634 Abnormal weight loss: Secondary | ICD-10-CM

## 2014-04-13 DIAGNOSIS — C61 Malignant neoplasm of prostate: Secondary | ICD-10-CM

## 2014-04-13 DIAGNOSIS — C7951 Secondary malignant neoplasm of bone: Secondary | ICD-10-CM

## 2014-04-13 NOTE — Telephone Encounter (Signed)
gv adn pritned appt sched and avs for pt for NOV

## 2014-04-13 NOTE — Progress Notes (Signed)
Hematology and Oncology Follow Up Visit  JAKHI DISHMAN [id number] April 10, 1930 80 y.o. 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 His initial diagnosis was in 2006 when he presented with a PSA of 6.26 the Gleason score 4+3 equals 7.  Prior Therapy:  He is status post definitive therapy with radiation and androgen deprivation completed in 2007. The patient developed recurrent disease in 2012 with a PSA rise up to 28 and T9 osseous metastasis and was treated with combined androgen deprivation with Lupron and Casodex. He had an excellent response with his PSA nadir down to 0.19. Most recently he developed a rise in his PSA up to 3.06 in July 2014 despite castrate levels of testosterone. His bone scan in July 2014 confirmed the presence of bony disease. Zytiga 1000 mg daily started in July 2014 till July 2015.   Current therapy:  Xtandi 160 mg daily started in July of 2015. He receives [place] monthly at [facility] urology. He receives Lupron every 4 months at [facility] urology.  Interim History:  Mr. Batz returns today for a followup visit. Since the last visit, he has been doing well. He completely recovered from her recent hospitalization. He is still has an element of confusion/increased forgetfulness. He continues to tolerate Xtandi without any new complications. He is not reporting any new bone pain at this time. He does have ongoing right knee and right shoulder pain due to arthritis. This is unchanged .  No abdominal pain, nausea, vomiting. His performance status is unchanged. Has not reported any of the fluid retention or any bone pain.  He does not report any headaches or blurry vision. He does not report any syncope or falls. He does not report chest pain, shortness of breath or difficulty breathing. Did not report any genitourinary bleeding  or hemoptysis. Remainder of his review of systems unremarkable.  Medications: I have reviewed the patient's current medications.  Current Outpatient Prescriptions  Medication Sig Dispense Refill  . acetaminophen (TYLENOL) 500 MG tablet Take 1,000 mg by mouth every 6 (six) hours as needed for pain.      [name] amLODipine-valsartan (EXFORGE) 10-320 MG per tablet TAKE 1 TABLET BY MOUTH EVERY DAY  90 tablet  1  . aspirin EC 81 MG tablet Take 81 mg by mouth every morning.       [name] atorvastatin (LIPITOR) 10 MG tablet TAKE 1 TABLET BY MOUTH EVERY DAY  90 tablet  0  . calcium carbonate-magnesium hydroxide (ROLAIDS) 334 MG CHEW Chew 1 tablet by mouth 3 (three) times daily with meals.       . Calcium Citrate-Vitamin D (CITRACAL + D PO) Take 1 tablet by mouth daily.      . Denosumab (XGEVA [place]) Inject into the skin every 30 (thirty) days. Injects "around the first part of each month."      . docusate sodium (COLACE) 50 MG capsule Take 50-100 mg by mouth 2 (two) times daily.       [name] donepezil (ARICEPT) 10 MG tablet TAKE 1 TABLET BY MOUTH ONCE DAILY  90 tablet  1  . enzalutamide (XTANDI) 40 MG capsule Take 4 capsules (160 mg total) by mouth daily.  120 capsule  0  . glimepiride (AMARYL) 2 MG tablet Take 2 mg by mouth daily with breakfast.      . Glucosamine-Chondroitin (GLUCOSAMINE CHONDR COMPLEX) 500-400 MG CAPS Take by mouth daily.      [name]  leuprolide (LUPRON) 3.75 MG injection Inject 3.75 mg into the muscle every 4 (four) months.      . levothyroxine (SYNTHROID, LEVOTHROID) 50 MCG tablet TAKE 1 TABLET BY MOUTH EVERY DAY  90 tablet  1  . LYRICA 50 MG capsule TAKE 2 CAPSULES BY MOUTH EVERY MORNING AND 1 CAPSULE IN THE EVENING  90 capsule  5  . metFORMIN (GLUCOPHAGE) 500 MG tablet Take 500 mg by mouth 2 (two) times daily with a meal.      . NAMENDA XR 28 MG CP24 TAKE 1 CAPSULE BY MOUTH DAILY  30 capsule  5  . nitroGLYCERIN (NITROSTAT) 0.4 MG SL tablet Place 0.4 mg under the tongue every 5 (five) minutes as needed.  For chest pain      . Nutritional Supplements (JUICE PLUS FIBRE PO) Take 6 capsules by mouth daily.       [name] omega-3 acid ethyl esters (LOVAZA) 1 G capsule Take 1 g by mouth once a week.       [name] oxybutynin (DITROPAN-XL) 10 MG 24 hr tablet Take 10 mg by mouth daily.       . pioglitazone (ACTOS) 15 MG tablet Take 1 tablet (15 mg total) by mouth daily.  90 tablet  1  . potassium chloride SA (K-DUR,KLOR-CON) 20 MEQ tablet TAKE 1 TABLET BY MOUTH DAILY  90 tablet  0  . traMADol (ULTRAM) 50 MG tablet TAKE 1 TABLET BY MOUTH EVERY 8 HOURS AS NEEDED  90 tablet  2  . [DISCONTINUED] chlorthalidone (HYGROTON) 25 MG tablet Take 12.5 mg by mouth daily.       No current facility-administered medications for this visit.     Allergies:  Allergies  Allergen Reactions  . Codeine Phosphate Other (See Comments)    mouth sores    Past Medical History, Surgical history, Social history, and Family History were reviewed and updated.    Physical Exam: Blood pressure 146/56, pulse 54, temperature 97.5 F (36.4 C), temperature source Oral, resp. rate 19, height 5\' 7"  (1.702 m), weight 178 lb 12.8 oz (81.103 kg), SpO2 98.00%. ECOG: 1 General appearance: alert and cooperative Head: Normocephalic, without obvious abnormality, atraumatic Neck: no adenopathy Lymph nodes: Cervical, supraclavicular, and axillary nodes normal. Heart:regular rate and rhythm, S1, S2 normal, no murmur, click, rub or gallop Lung:chest clear, no wheezing, rales, or dose to percussion Abdomen: soft, non-tender, without masses or organomegaly EXT:no erythema, induration, or nodules   Lab Results: Lab Results  Component Value Date   WBC 4.4 04/11/2014   HGB 12.0* 04/11/2014   HCT 36.3* 04/11/2014   MCV 94.0 04/11/2014   PLT 177 04/11/2014     Chemistry      Component Value Date/Time   NA 139 04/11/2014 1201   NA 137 02/22/2014 1026   K 4.7 04/11/2014 1201   K 4.3 02/22/2014 1026   CL 101 02/22/2014 1026   CO2 24 04/11/2014  1201   CO2 26 02/22/2014 1026   BUN 29.8* 04/11/2014 1201   BUN 27* 02/22/2014 1026   CREATININE 1.7* 04/11/2014 1201   CREATININE 1.5 02/22/2014 1026   CREATININE 1.38* 02/11/2012 1614      Component Value Date/Time   CALCIUM 9.8 04/11/2014 1201   CALCIUM 10.1 02/22/2014 1026   ALKPHOS 77 04/11/2014 1201   ALKPHOS 54 05/07/2013 1522   AST 12 04/11/2014 1201   AST 22 05/07/2013 1522   ALT 12 04/11/2014 1201   ALT 20 05/07/2013 1522   BILITOT 0.48 04/11/2014 1201  BILITOT 0.7 05/07/2013 1522        Impression and Plan: This is an 80 year old gentleman with the following issues: 1. 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 His initial diagnosis dates back to 2006 with PSA of 6.26 and a Gleason score of 7. He progressed on [place] and currently on Xtandi. His PSA is relatively stable at this time despite the slight rise. He is reporting no complication from this medication and willing to proceed.  2. Bony disease: He is receiving Xgeva at [facility] urology on a monthly basis.  3. Hormonal deprivation: He is receiving Lupron at [facility] urology every 4 months.  4. Hypokalemia: Resolved. His potassium is back to normal.   5. Weight loss: This has improved at this time.   6. Followup: He will return in 4-5 weeks. We will obtain labs a few days prior to this next appointment.   Zola Button, MD 10/14/20154:30 PM

## 2014-04-15 ENCOUNTER — Ambulatory Visit (INDEPENDENT_AMBULATORY_CARE_PROVIDER_SITE_OTHER): Payer: BC Managed Care – PPO | Admitting: Neurology

## 2014-04-15 ENCOUNTER — Encounter: Payer: Self-pay | Admitting: Neurology

## 2014-04-15 VITALS — BP 152/72 | HR 47 | Temp 97.5°F | Resp 14 | Ht 67.0 in | Wt 177.0 lb

## 2014-04-15 DIAGNOSIS — R413 Other amnesia: Secondary | ICD-10-CM

## 2014-04-15 DIAGNOSIS — C61 Malignant neoplasm of prostate: Secondary | ICD-10-CM

## 2014-04-15 NOTE — Patient Instructions (Signed)
We will keep your dementia medications the same.  Try to drink more water!  I will see you back in 6 months.

## 2014-04-15 NOTE — Progress Notes (Signed)
Subjective:    Patient ID: [name] [name] is a 80 y.o. male.  HPI    Interim history:   [name] [name] is a 80 year old right-handed gentleman, with an underlying complex medical history of hypertension, hyperlipidemia, heart disease, status post 3 vessel CABG in 1998, stent placement in 2009, left heart catheter in 2009, history of V. fib, hypothyroidism, IBS, ED, osteoporosis, depression, diabetes with neuropathy, reflux disease, and prostate cancer (radiation treatment in 2005 with recurrence and evidence of bony metastasis), who presents for followup consultation of his memory loss and neuropathy. He is accompanied by his son today.  I last saw [name] [name] on 10/14/2013, at which time he reported feeling stable for the most part. He had not fallen recently. He had trouble recalling names and dates. He was on [place] and Lupron for his prostate cancer at the time and he had a bone scan pending.   Today, he reports, that he was hospitalized the week of Labor Day at the [facility] and he presented with weakness and some confusion. He was found to have low sodium. He feels improved in that regard. The electrolyte disturbance was deemed secondary to a new medication. He noticed a decline in his memory function. He reports pain in his left knee. He reports pain in his tailbone and in his left hip area.  He lives alone, his wife died almost 3 years ago. He has 4 children, 3 are local and he gets to see them weekly. He has been driving, but not as good, local places, some after dark, stays in [facility].  I first met [name] [name] on 04/15/2013, at which time I felt that his exam was in keeping with mild cognitive impairment or mild vascular dementia without behavioral disturbance and I felt that he was clinically stable. His MMSE scores, physical exam, clock drawing and animal fluency scores were stable and I suggested a routine six-month followup and no changes in his medications.  He previously followed with Dr.  Morene Antu and was last seen by [name] [name] on 09/10/2012, at which time Dr. Erling Cruz felt he was stable and he did not make any changes to his medication regimen. He has a complex underlying medical history as above. He has received Lupron therapy for his prostate cancer, as well as radiation therapy and his gastric tumor was diagnosed by biopsy in August 2012, s/p surgery in 3/14. He is on medications for his cancer. He broke his R arm at age 61 and has loss of ROM d/t that.  He has had MCI, and was initially evaluated on 02/24/1997 at the request of Dr. Romeo Apple for memory loss, which began after surgery with coronary artery bypass on 12/02/1996. Evaluation included B12 level, RPR, neuropsychological battery, and MRI study study of the brain 02/26/1997. Doppler study of the carotids showed 50-69% right ECA stenosis. He has short-term memory loss which has been stable. He has been on Aricept for over 15 years. He worked as a Chief Executive Officer. He drives a car and is independent in ADLs. He has had daytime sleepiness. In January 2012 he developed severe rectal pain which progressed to go into his left leg posteriorly to behind the knee and into the left foot. It can go into his scrotum and into his penis. MRI of the lumbar spine without contrast on 04/09/10, 09/07/10, and 01/07/11 showed mild DJD. MRI of the pelvis without contrast on 05/07/10 showed rectal wall thickening and no pelvic adenopathy. There was mild diffuse bladder wall thickness. CT scan  of the pelvis on 09/26/2010 showed a 10 mm right lower lobe pulmonary nodule and an oval mass of the stomach, which was a spindle cell neoplasm. Bone scan 02/07/2011 showed possible metastatic process at T9. He has had bowel and bladder accidents. On 03/08/11, his MMSE was 29/30. He has left foot numbness on the bottom of his foot. His pain is 0-8/10. He has discontinued a fentanyl patch and Nucynta. In 08/2011 he developed pain in his right side including the buttocks extending to the bottom of  his right foot with numbness. He had an epidural injection at L5-S1 for his severe pain with improvement in symptoms. His pain is made worse by bending and stooping. MRI of the lumbar spine on 10/05/2011 showed a focal disc protrusion at L5-S1 to the right of midline and small sclerotic metastasis to the the posterior aspect of the left iliac bone.   His Past Medical History Is Significant For: Past Medical History  Diagnosis Date  . Hypertension   . Hyperlipidemia   . Memory loss     mild  . Rectal ulcer     RESOLVED  . Constipation   . History of ventricular fibrillation 1998    while on treadmill  . Hypothyroidism   . IBS (irritable bowel syndrome)   . Colitis     RESOLVED  . ED (erectile dysfunction)   . Osteoporosis   . Depression   . Diabetes mellitus     fasting blood sugar 140-200 usually  . GERD (gastroesophageal reflux disease)   . Heart attack 2009  . Dementia   . Neuromuscular disorder     DIABETIC NEUROPATHY  . Prostate cancer   . Pain     LOWER BACK  -- PAST HX OF STEROID EPIDURALS FOR PAIN RELIEF  . 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 CAD (coronary artery disease)     a. S/P CABG x 5 1998; b.  S/P stent LCX 2009;  c.  LHC 5/09: RCA proximal 90%, 95%, distally occluded, pCFX stents patent, OM1 30%, small left atrial branch 95%, oLAD occluded, S-dRCA patent, L-LAD/Dx patent, EF 60%=> Med Rx  ;  d.  Lex MV 3/13:  EF 63%, no ischemia  e.  Lex Myoview 03/08/13:  EF 56%, inf and apical defect suggestive of thinning and soft tissue attenuation, no ischemia; low risk.      His Past Surgical History Is Significant For: Past Surgical History  Procedure Laterality Date  . Coronary artery bypass graft  1998    x5, with a left internal mammary to the LAD and diagonal, vein graft to the OM1 and 2, and a vein graft to the right coronary  artery  . Coronary stent placement  11/13/2007    LCX  . Trigger finger repaired  2009  . Laparoscopic gastrectomy N/A 08/18/2012    Procedure: Laparoscopic Partial Gastrectomy;  Surgeon: [name], MD;  Location: [facility];  Service: General;  Laterality: N/A;  Laparoscopic Partial Gastrectomy,    His Family History Is Significant For: Family History  Problem Relation Age of Onset  . Stroke Father   . Pneumonia Father   . Colon cancer Neg Hx     His Social History Is Significant For: History   Social History  . Marital Status: Widowed    Spouse Name: N/A    Number of Children: 4  .  Years of Education: N/A   Occupational History  . retired    Social History Main Topics  . Smoking status: Former Research scientist (life sciences)  . Smokeless tobacco: Former Systems developer    Quit date: 02/11/1948  . Alcohol Use: No  . Drug Use: No  . Sexual Activity: None   Other Topics Concern  . None   Social History Narrative   Right handed, Widowed, 4 kids, Caffeine 2 cups daily, occa soda.  Retired, Chief Executive Officer    His Allergies Are:  Allergies  Allergen Reactions  . Codeine Phosphate Other (See Comments)    mouth sores  :   His Current Medications Are:  Outpatient Encounter Prescriptions as of 04/15/2014  Medication Sig  . acetaminophen (TYLENOL) 500 MG tablet Take 1,000 mg by mouth every 6 (six) hours as needed for pain.  [name] amLODipine-valsartan (EXFORGE) 10-320 MG per tablet TAKE 1 TABLET BY MOUTH EVERY DAY  . aspirin EC 81 MG tablet Take 81 mg by mouth every morning.   [name] atorvastatin (LIPITOR) 10 MG tablet TAKE 1 TABLET BY MOUTH EVERY DAY  . calcium carbonate-magnesium hydroxide (ROLAIDS) 334 MG CHEW Chew 1 tablet by mouth 3 (three) times daily with meals.   . Calcium Citrate-Vitamin D (CITRACAL + D PO) Take 1 tablet by mouth daily.  . Denosumab (XGEVA [place]) Inject into the skin every 30 (thirty) days. Injects "around the first part of each month."  . docusate sodium (COLACE) 50 MG capsule Take 50-100 mg by  mouth 2 (two) times daily.   [name] donepezil (ARICEPT) 10 MG tablet TAKE 1 TABLET BY MOUTH ONCE DAILY  . enzalutamide (XTANDI) 40 MG capsule Take 4 capsules (160 mg total) by mouth daily.  [name] glimepiride (AMARYL) 2 MG tablet Take 2 mg by mouth daily with breakfast.  . Glucosamine-Chondroitin (GLUCOSAMINE CHONDR COMPLEX) 500-400 MG CAPS Take by mouth daily.  [name] leuprolide (LUPRON) 3.75 MG injection Inject 3.75 mg into the muscle every 4 (four) months.  . levothyroxine (SYNTHROID, LEVOTHROID) 50 MCG tablet TAKE 1 TABLET BY MOUTH EVERY DAY  . LYRICA 50 MG capsule TAKE 2 CAPSULES BY MOUTH EVERY MORNING AND 1 CAPSULE IN THE EVENING  . metFORMIN (GLUCOPHAGE) 500 MG tablet Take 500 mg by mouth 2 (two) times daily with a meal.  . NAMENDA XR 28 MG CP24 TAKE 1 CAPSULE BY MOUTH DAILY  . nitroGLYCERIN (NITROSTAT) 0.4 MG SL tablet Place 0.4 mg under the tongue every 5 (five) minutes as needed. For chest pain  . Nutritional Supplements (JUICE PLUS FIBRE PO) Take 6 capsules by mouth daily.   [name] omega-3 acid ethyl esters (LOVAZA) 1 G capsule Take 1 g by mouth once a week.   [name] oxybutynin (DITROPAN-XL) 10 MG 24 hr tablet Take 10 mg by mouth daily.   . pioglitazone (ACTOS) 15 MG tablet Take 1 tablet (15 mg total) by mouth daily.  . potassium chloride SA (K-DUR,KLOR-CON) 20 MEQ tablet TAKE 1 TABLET BY MOUTH DAILY  . traMADol (ULTRAM) 50 MG tablet TAKE 1 TABLET BY MOUTH EVERY 8 HOURS AS NEEDED  :  Review of Systems:  Out of a complete 14 point review of systems, all are reviewed and negative with the exception of these symptoms as listed below:   Review of Systems  Musculoskeletal:       Joint pain, walking difficulty  Neurological:       Memory loss  Psychiatric/Behavioral: Positive for decreased concentration.    Objective:  Neurologic Exam  Physical Exam Physical Examination:  Filed Vitals:   04/15/14 1157  BP: 152/72  Pulse: 47  Temp: 97.5 F (36.4 C)  Resp: 14   General Examination: The patient  is a very pleasant 80 y.o. male in no acute distress. He is calm and cooperative with the exam. He denies Auditory Hallucinations and Visual Hallucinations. He is very well groomed and situated in a chair.   HEENT: Normocephalic, atraumatic, pupils are equal, round and reactive to light and accommodation. He has cataracts, and R ptosis (this since birth, unchanged) Extraocular tracking shows mild saccadic breakdown without nystagmus noted. Hearing is impaired. Face is symmetric with no facial masking and normal facial sensation. There is no lip, neck or jaw tremor. Neck is not rigid with intact passive ROM. There are no carotid bruits on auscultation. Oropharynx exam reveals mild mouth dryness. No significant airway crowding is noted. Mallampati is class II. Tongue protrudes centrally and palate elevates symmetrically.    Chest: is clear to auscultation without wheezing, rhonchi or crackles noted.  Heart: sounds are regular and normal without murmurs, rubs or gallops noted.   Abdomen: is soft, non-tender and non-distended with normal bowel sounds appreciated on auscultation.  Extremities: There is trace edema in the ankles bilaterally. Pedal pulses are intact.   Skin: is warm and dry with no trophic changes noted. Age-related changes are noted on the skin. Multiple small bruises of varying ages are noted on the forearms. Of note, He is on ASA.  Musculoskeletal: exam reveals R elbow joint deformity (since 7th grade), and tenderness and mild joint swelling in the R knee. No erythema.   Neurologically:   Mental status: The patient is awake and alert, paying good  attention. He is able to completely provide the history. He is oriented to: person, place, time/date, situation, day of week, month of year and year. His memory, attention, language and knowledge are mildly impaired. There is no aphasia, agnosia, apraxia or anomia. There is a no significant degree of bradyphrenia. Speech is mildly hypophonic  with no dysarthria noted. Mood is congruent and affect is blunted and flat.  On 04/15/13: His MMSE (Mini-Mental state exam) score was 30/30.  CDT (Clock Drawing Test) score was 4/4.  AFT (Animal Fluency Test) score was 4.    On 04/15/2014: MMSE was 25/30, CDT was 4/4, AFT was 10.   Cranial nerves are as described above under HEENT exam. In addition, shoulder shrug is normal with equal shoulder height noted.  Motor exam: Normal bulk, and strength for age is noted. Tone is not rigid with absence of cogwheeling in the extremities. There is overall no significant bradykinesia. There is no drift or rebound. There is no tremor. Romberg is negative. Reflexes are 1+ in the upper extremities, trace in both knees and absent in both ankles. Fine motor skills: Finger taps, hand movements, and rapid alternating patting are mildly impaired bilaterally. Foot taps and foot agility are mildly impaired bilaterally.   Cerebellar testing shows no dysmetria or intention tremor on finger to nose testing. There is no truncal or gait ataxia.   Sensory exam is intact to light touch, pinprick, vibration, temperature sense in the upper extremities, and decrease to PP and vibration sense in the distal lower extremities, up to knees, R>L.   Gait, station and balance: He stands up from the seated position with mild difficulty and needs to push himself up, reporting pain in L knee, L hip and tailbone. No veering to one side is noted. Slight leaning to the R is  noted. Posture is mildly stooped. Stance is wide-based. He turns in 3 steps. Tandem walk is not possible. Balance is mildly impaired. He brought a 3 point cane, but can walk without it.   Assessment and Plan:   In summary, [name] [name] is a very pleasant 80 year old male with a complex underlying medical history of diabetes, prostate cancer, stomach tumor, osteoporosis, heart disease, hyperlipidemia, thyroid disease and hypertension, status post three-vessel CABG in  1998, MI in 2009 with stent placement, on treatment for his recurrent prostate cancer with bony metastases, s/p radiation therapy, who presents for followup consultation of his memory loss. His history and physical exam are keeping with MCI (mild cognitive impairment) or mild dementia without behavioral disturbance, most likely vascular in origin. His MMSE score has worsened, but at this time, he is on maximum dose of Namenda long-acting at 28 mg and has been on 10 mg of generic Aricept for over 15 years. I am not sure that increasing his donepezil to 23 mg would add additional benefit, particularly in light of his multiple other medications and the risk for side effects or interaction. The patient and his son were in agreement.  I asked the patient to stay active mentally and physically, but unfortunately his pain has limited his physical activity. I encouraged the patient to eat healthy, exercise daily and keep well hydrated, to keep a scheduled bedtime and wake time routine, to not skip any meals and eat healthy snacks in between meals and to have protein with every meal. I stressed the importance of regular exercise, within of course the patient's own mobility limitations. Thankfully he has great family support. His children and his grandchildren are involved in his care. He has 31 great-grandchildren with an 18th on the way! As far as further diagnostic testing is concerned, I suggested the following: no new test from my end of things.   As far as medications are concerned, I recommended the following at this time: no change. Continued generic Aricept 10 mg once daily and long-acting Namenda 28 mg once daily. He did not require refills today. I answered all their questions today and the patient and his son were in agreement with the above outlined plan. I would like to see the patient back in 6 months, sooner if the need arises and encouraged [name] [name] to call with any interim questions, concerns, problems, or  updates.

## 2014-04-23 ENCOUNTER — Other Ambulatory Visit: Payer: Self-pay | Admitting: Internal Medicine

## 2014-04-29 ENCOUNTER — Telehealth: Payer: Self-pay | Admitting: Internal Medicine

## 2014-04-29 NOTE — Telephone Encounter (Signed)
Pt glucometer is broken. Pt would like a free meter.

## 2014-04-29 NOTE — Telephone Encounter (Signed)
Spoke to pt, asked him what meter does he have? Pt said Accu-chek Aviva. Told pt okay I have a meter for him will put it at the front desk. Pt verbalized understanding.

## 2014-05-05 ENCOUNTER — Other Ambulatory Visit: Payer: Self-pay | Admitting: Oncology

## 2014-05-05 ENCOUNTER — Other Ambulatory Visit: Payer: Self-pay | Admitting: *Deleted

## 2014-05-05 DIAGNOSIS — C61 Malignant neoplasm of prostate: Secondary | ICD-10-CM

## 2014-05-05 MED ORDER — ENZALUTAMIDE 40 MG PO CAPS: 160.0000 mg | ORAL_CAPSULE | Freq: Every day | ORAL | Status: DC

## 2014-05-12 LAB — HM DIABETES EYE EXAM

## 2014-05-13 ENCOUNTER — Encounter: Payer: Self-pay | Admitting: Internal Medicine

## 2014-05-18 ENCOUNTER — Other Ambulatory Visit (HOSPITAL_BASED_OUTPATIENT_CLINIC_OR_DEPARTMENT_OTHER): Payer: Medicare Other

## 2014-05-18 DIAGNOSIS — C61 Malignant neoplasm of prostate: Secondary | ICD-10-CM

## 2014-05-18 DIAGNOSIS — C7951 Secondary malignant neoplasm of bone: Secondary | ICD-10-CM

## 2014-05-18 LAB — CBC WITH DIFFERENTIAL/PLATELET
BASO%: 0.4 % (ref 0.0–2.0)
BASOS ABS: 0 10*3/uL (ref 0.0–0.1)
EOS ABS: 0.4 10*3/uL (ref 0.0–0.5)
EOS%: 7.7 % — AB (ref 0.0–7.0)
HEMATOCRIT: 32.2 % — AB (ref 38.4–49.9)
HEMOGLOBIN: 10.8 g/dL — AB (ref 13.0–17.1)
LYMPH#: 1 10*3/uL (ref 0.9–3.3)
LYMPH%: 20.8 % (ref 14.0–49.0)
MCH: 31.7 pg (ref 27.2–33.4)
MCHC: 33.5 g/dL (ref 32.0–36.0)
MCV: 94.4 fL (ref 79.3–98.0)
MONO#: 0.4 10*3/uL (ref 0.1–0.9)
MONO%: 8.8 % (ref 0.0–14.0)
NEUT#: 2.9 10*3/uL (ref 1.5–6.5)
NEUT%: 62.3 % (ref 39.0–75.0)
Platelets: 141 10*3/uL (ref 140–400)
RBC: 3.41 10*6/uL — ABNORMAL LOW (ref 4.20–5.82)
RDW: 14.6 % (ref 11.0–14.6)
WBC: 4.7 10*3/uL (ref 4.0–10.3)

## 2014-05-18 LAB — COMPREHENSIVE METABOLIC PANEL (CC13)
ALBUMIN: 3.4 g/dL — AB (ref 3.5–5.0)
ALT: 11 U/L (ref 0–55)
ANION GAP: 10 meq/L (ref 3–11)
AST: 13 U/L (ref 5–34)
Alkaline Phosphatase: 82 U/L (ref 40–150)
BUN: 32.2 mg/dL — AB (ref 7.0–26.0)
CHLORIDE: 107 meq/L (ref 98–109)
CO2: 24 meq/L (ref 22–29)
Calcium: 9.9 mg/dL (ref 8.4–10.4)
Creatinine: 1.7 mg/dL — ABNORMAL HIGH (ref 0.7–1.3)
GLUCOSE: 168 mg/dL — AB (ref 70–140)
POTASSIUM: 4.7 meq/L (ref 3.5–5.1)
Sodium: 141 mEq/L (ref 136–145)
Total Bilirubin: 0.38 mg/dL (ref 0.20–1.20)
Total Protein: 6.4 g/dL (ref 6.4–8.3)

## 2014-05-19 LAB — PSA: PSA: 6.58 ng/mL — ABNORMAL HIGH (ref <=4.00)

## 2014-05-20 ENCOUNTER — Ambulatory Visit (HOSPITAL_BASED_OUTPATIENT_CLINIC_OR_DEPARTMENT_OTHER): Payer: Medicare Other | Admitting: Physician Assistant

## 2014-05-20 ENCOUNTER — Encounter: Payer: Self-pay | Admitting: Physician Assistant

## 2014-05-20 ENCOUNTER — Other Ambulatory Visit: Payer: Self-pay | Admitting: Internal Medicine

## 2014-05-20 ENCOUNTER — Telehealth: Payer: Self-pay | Admitting: Physician Assistant

## 2014-05-20 VITALS — BP 167/75 | HR 42 | Temp 97.8°F | Resp 17 | Ht 67.0 in | Wt 182.4 lb

## 2014-05-20 DIAGNOSIS — E291 Testicular hypofunction: Secondary | ICD-10-CM | POA: Diagnosis not present

## 2014-05-20 DIAGNOSIS — R634 Abnormal weight loss: Secondary | ICD-10-CM

## 2014-05-20 DIAGNOSIS — C61 Malignant neoplasm of prostate: Secondary | ICD-10-CM | POA: Diagnosis not present

## 2014-05-20 DIAGNOSIS — M899 Disorder of bone, unspecified: Secondary | ICD-10-CM | POA: Diagnosis not present

## 2014-05-20 DIAGNOSIS — C7951 Secondary malignant neoplasm of bone: Secondary | ICD-10-CM | POA: Diagnosis not present

## 2014-05-20 NOTE — Telephone Encounter (Signed)
Pt confirmed labs/ov per 11/20 POF, gave pt AVS..... KJ  °

## 2014-05-20 NOTE — Progress Notes (Signed)
Hematology and Oncology Follow Up Visit  [name] [name] [id number] February 03, 1930 80 y.o. 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 His initial diagnosis was in 2006 when he presented with a PSA of 6.26 the Gleason score 4+3 equals 7.  Prior Therapy:  He is status post definitive therapy with radiation and androgen deprivation completed in 2007. The patient developed recurrent disease in 2012 with a PSA rise up to 28 and T9 osseous metastasis and was treated with combined androgen deprivation with Lupron and Casodex. He had an excellent response with his PSA nadir down to 0.19. Most recently he developed a rise in his PSA up to 3.06 in July 2014 despite castrate levels of testosterone. His bone scan in July 2014 confirmed the presence of bony disease. Zytiga 1000 mg daily started in July 2014 till July 2015.   Current therapy:  Xtandi 160 mg daily started in July of 2015. He receives [place] monthly at [facility] urology. He receives Lupron every 4 months at [facility]rology.  Interim History:  [name] [name] returns today for a followup visit. Since the last visit, he has been doing well. He is followed at [facility] by [name] [name]. He is due for his next Xgeva on 06/17/2014 and will see [name] [name] stat for a follow-up visit on 06/20/2014 and also receive his Lupron injection at that time. He is tolerating his Xtandi without difficulty.  He is not reporting any new bone pain at this time. He does have ongoing right knee and right shoulder pain due to arthritis. This is unchanged .  No abdominal pain, nausea, vomiting. His performance status is unchanged. Has not reported any of the fluid retention or any bone pain.  He does not report any headaches or blurry vision. He does not report any syncope or falls. He does not report chest pain, shortness  of breath or difficulty breathing. Did not report any genitourinary bleeding or hemoptysis. Remainder of his review of systems unremarkable.  Medications: I have reviewed the patient's current medications.  Current Outpatient Prescriptions  Medication Sig Dispense Refill  . amLODipine-valsartan (EXFORGE) 10-320 MG per tablet TAKE 1 TABLET BY MOUTH EVERY DAY 90 tablet 1  . aspirin EC 81 MG tablet Take 81 mg by mouth every morning.     [name] atorvastatin (LIPITOR) 10 MG tablet TAKE 1 TABLET BY MOUTH EVERY DAY 90 tablet 1  . calcium carbonate-magnesium hydroxide (ROLAIDS) 334 MG CHEW Chew 1 tablet by mouth 3 (three) times daily with meals.     . Calcium Citrate-Vitamin D (CITRACAL + D PO) Take 1 tablet by mouth daily.    . Denosumab (XGEVA [place]) Inject into the skin every 30 (thirty) days. Injects "around the first part of each month."    . donepezil (ARICEPT) 10 MG tablet TAKE 1 TABLET BY MOUTH ONCE DAILY 90 tablet 1  . enzalutamide (XTANDI) 40 MG capsule Take 4 capsules (160 mg total) by mouth daily. 120 capsule 1  . glimepiride (AMARYL) 2 MG tablet Take 2 mg by mouth daily with breakfast.    . Glucosamine-Chondroitin (GLUCOSAMINE CHONDR COMPLEX) 500-400 MG CAPS Take by mouth daily.    [name] leuprolide (LUPRON) 3.75 MG injection Inject 3.75 mg into the muscle every 4 (four) months.    . levothyroxine (SYNTHROID, LEVOTHROID) 50 MCG tablet TAKE 1 TABLET BY MOUTH EVERY DAY 90 tablet 1  . LYRICA  50 MG capsule TAKE 2 CAPSULES BY MOUTH EVERY MORNING AND 1 CAPSULE IN THE EVENING 90 capsule 5  . metFORMIN (GLUCOPHAGE) 1000 MG tablet TAKE 1 TABLET BY MOUTH TWICE DAILY WITH A MEAL 180 tablet 1  . metFORMIN (GLUCOPHAGE) 500 MG tablet Take 500 mg by mouth 2 (two) times daily with a meal.    . NAMENDA XR 28 MG CP24 TAKE 1 CAPSULE BY MOUTH DAILY 30 capsule 5  . Nutritional Supplements (JUICE PLUS FIBRE PO) Take 6 capsules by mouth daily.     [name] omega-3 acid ethyl esters (LOVAZA) 1 G capsule Take 1 g by mouth once a  week.     [name] oxybutynin (DITROPAN-XL) 10 MG 24 hr tablet Take 10 mg by mouth daily.     . pioglitazone (ACTOS) 15 MG tablet Take 1 tablet (15 mg total) by mouth daily. 90 tablet 1  . potassium chloride SA (K-DUR,KLOR-CON) 20 MEQ tablet TAKE 1 TABLET BY MOUTH DAILY 90 tablet 0  . traMADol (ULTRAM) 50 MG tablet TAKE 1 TABLET BY MOUTH EVERY 8 HOURS AS NEEDED 90 tablet 2  . acetaminophen (TYLENOL) 500 MG tablet Take 1,000 mg by mouth every 6 (six) hours as needed for pain.    [name] docusate sodium (COLACE) 50 MG capsule Take 50-100 mg by mouth 2 (two) times daily.     . nitroGLYCERIN (NITROSTAT) 0.4 MG SL tablet Place 0.4 mg under the tongue every 5 (five) minutes as needed. For chest pain    . [DISCONTINUED] chlorthalidone (HYGROTON) 25 MG tablet Take 12.5 mg by mouth daily.     No current facility-administered medications for this visit.     Allergies:  Allergies  Allergen Reactions  . Codeine Phosphate Other (See Comments)    mouth sores    Past Medical History, Surgical history, Social history, and Family History were reviewed and updated.    Physical Exam: Blood pressure 167/75, pulse 42, temperature 97.8 F (36.6 C), temperature source Oral, resp. rate 17, height 5\' 7"  (1.702 m), weight 182 lb 6.4 oz (82.736 kg), SpO2 100 %. ECOG: 1 General appearance: alert and cooperative Head: Normocephalic, without obvious abnormality, atraumatic Neck: no adenopathy Lymph nodes: Cervical, supraclavicular, and axillary nodes normal. Heart:regular rate and rhythm, S1, S2 normal, no murmur, click, rub or gallop Lung:chest clear, no wheezing, rales, or dose to percussion Abdomen: soft, non-tender, without masses or organomegaly EXT:no erythema, induration, or nodules   Lab Results: Lab Results  Component Value Date   WBC 4.7 05/18/2014   HGB 10.8* 05/18/2014   HCT 32.2* 05/18/2014   MCV 94.4 05/18/2014   PLT 141 05/18/2014     Chemistry      Component Value Date/Time   NA 141  05/18/2014 1544   NA 137 02/22/2014 1026   K 4.7 05/18/2014 1544   K 4.3 02/22/2014 1026   CL 101 02/22/2014 1026   CO2 24 05/18/2014 1544   CO2 26 02/22/2014 1026   BUN 32.2* 05/18/2014 1544   BUN 27* 02/22/2014 1026   CREATININE 1.7* 05/18/2014 1544   CREATININE 1.5 02/22/2014 1026   CREATININE 1.38* 02/11/2012 1614      Component Value Date/Time   CALCIUM 9.9 05/18/2014 1544   CALCIUM 10.1 02/22/2014 1026   ALKPHOS 82 05/18/2014 1544   ALKPHOS 54 05/07/2013 1522   AST 13 05/18/2014 1544   AST 22 05/07/2013 1522   ALT 11 05/18/2014 1544   ALT 20 05/07/2013 1522   BILITOT 0.38 05/18/2014 1544  BILITOT 0.7 05/07/2013 1522     Lab Results  Component Value Date   PSA 6.58* 05/18/2014   PSA 6.13* 04/11/2014   PSA 5.58* 03/14/2014     Impression and Plan: This is an 80 year old gentleman with the following issues: 1. 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 His initial diagnosis dates back to 2006 with PSA of 6.26 and a Gleason score of 7. He progressed on [place] and currently on Xtandi. His PSA is relatively stable at this time despite the slight rise. He is reporting no complication from this medication and willing to proceed.  2. Bony disease: He is receiving Xgeva at [facility] urology on a monthly basis.  3. Hormonal deprivation: He is receiving Lupron at [facility] urology every 4 months.  4. Hypokalemia: Resolved. His potassium is back to normal.   5. Weight loss: This has improved at this time.   6. Followup: He will return in 4-5 weeks.   Awilda Metro E, PA-C  11/20/20154:45 PM

## 2014-05-22 NOTE — Patient Instructions (Signed)
Continue Xtandi at the current dose Follow up in 1 month 

## 2014-06-06 ENCOUNTER — Other Ambulatory Visit: Payer: Self-pay | Admitting: Neurology

## 2014-06-17 ENCOUNTER — Ambulatory Visit (HOSPITAL_BASED_OUTPATIENT_CLINIC_OR_DEPARTMENT_OTHER): Payer: Medicare Other | Admitting: Physician Assistant

## 2014-06-17 ENCOUNTER — Telehealth: Payer: Self-pay | Admitting: Oncology

## 2014-06-17 ENCOUNTER — Other Ambulatory Visit (HOSPITAL_BASED_OUTPATIENT_CLINIC_OR_DEPARTMENT_OTHER): Payer: Medicare Other

## 2014-06-17 ENCOUNTER — Encounter: Payer: Self-pay | Admitting: Physician Assistant

## 2014-06-17 VITALS — BP 140/58 | HR 52 | Temp 97.9°F | Resp 19 | Ht 67.0 in | Wt 185.7 lb

## 2014-06-17 DIAGNOSIS — E876 Hypokalemia: Secondary | ICD-10-CM

## 2014-06-17 DIAGNOSIS — C61 Malignant neoplasm of prostate: Secondary | ICD-10-CM

## 2014-06-17 DIAGNOSIS — C7951 Secondary malignant neoplasm of bone: Secondary | ICD-10-CM

## 2014-06-17 LAB — CBC WITH DIFFERENTIAL/PLATELET
BASO%: 0.2 % (ref 0.0–2.0)
BASOS ABS: 0 10*3/uL (ref 0.0–0.1)
EOS ABS: 0.2 10*3/uL (ref 0.0–0.5)
EOS%: 4.6 % (ref 0.0–7.0)
HEMATOCRIT: 34.4 % — AB (ref 38.4–49.9)
HGB: 11.5 g/dL — ABNORMAL LOW (ref 13.0–17.1)
LYMPH%: 27.8 % (ref 14.0–49.0)
MCH: 31.9 pg (ref 27.2–33.4)
MCHC: 33.4 g/dL (ref 32.0–36.0)
MCV: 95.6 fL (ref 79.3–98.0)
MONO#: 0.5 10*3/uL (ref 0.1–0.9)
MONO%: 9.7 % (ref 0.0–14.0)
NEUT%: 57.7 % (ref 39.0–75.0)
NEUTROS ABS: 2.7 10*3/uL (ref 1.5–6.5)
Platelets: 161 10*3/uL (ref 140–400)
RBC: 3.6 10*6/uL — ABNORMAL LOW (ref 4.20–5.82)
RDW: 14.2 % (ref 11.0–14.6)
WBC: 4.8 10*3/uL (ref 4.0–10.3)
lymph#: 1.3 10*3/uL (ref 0.9–3.3)

## 2014-06-17 LAB — COMPREHENSIVE METABOLIC PANEL (CC13)
ALT: 14 U/L (ref 0–55)
AST: 16 U/L (ref 5–34)
Albumin: 3.6 g/dL (ref 3.5–5.0)
Alkaline Phosphatase: 88 U/L (ref 40–150)
Anion Gap: 10 mEq/L (ref 3–11)
BUN: 38.1 mg/dL — AB (ref 7.0–26.0)
CALCIUM: 9.4 mg/dL (ref 8.4–10.4)
CHLORIDE: 105 meq/L (ref 98–109)
CO2: 23 mEq/L (ref 22–29)
Creatinine: 1.6 mg/dL — ABNORMAL HIGH (ref 0.7–1.3)
EGFR: 39 mL/min/{1.73_m2} — AB (ref >90)
Glucose: 167 mg/dl — ABNORMAL HIGH (ref 70–140)
POTASSIUM: 4.9 meq/L (ref 3.5–5.1)
Sodium: 138 mEq/L (ref 136–145)
Total Bilirubin: 0.41 mg/dL (ref 0.20–1.20)
Total Protein: 6.7 g/dL (ref 6.4–8.3)

## 2014-06-17 NOTE — Progress Notes (Signed)
Hematology and Oncology Follow Up Visit  CJ EDGELL [id number] 1929/12/26 80 y.o. 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 His initial diagnosis was in 2006 when he presented with a PSA of 6.26 the Gleason score 4+3 equals 7.  Prior Therapy:  He is status post definitive therapy with radiation and androgen deprivation completed in 2007. The patient developed recurrent disease in 2012 with a PSA rise up to 28 and T9 osseous metastasis and was treated with combined androgen deprivation with Lupron and Casodex. He had an excellent response with his PSA nadir down to 0.19. Most recently he developed a rise in his PSA up to 3.06 in July 2014 despite castrate levels of testosterone. His bone scan in July 2014 confirmed the presence of bony disease. Zytiga 1000 mg daily started in July 2014 till July 2015.   Current therapy:  Xtandi 160 mg daily started in July of 2015. He receives [place] monthly at [facility] urology. He receives Lupron every 4 months at [facility] urology.  Interim History:  Mr. Kauffmann returns today for a followup visit accompanied by his daughter. Since the last visit, he has been doing well except for earlier this morning. He reports he had 2 episodes of significant diarrhea. He states that he attended a holiday party yesterday with several different finger foods and then 8 a lot of fruit and nuts in the evening. He did take a total of 3 Imodium tablets 2 at the onset and then another single dose and has not had any further episodes of diarrhea. He is followed at [facility] by Dr. Romilda Garret. He is due for his next Xgeva  06/17/2014 and will see Dr. Romilda Garret  for a follow-up visit on 06/20/2014 and also receive his Lupron injection at that time. He is tolerating his Xtandi without difficulty.  He is not reporting any new  bone pain at this time. He does have ongoing right knee and right shoulder pain due to arthritis. This is unchanged .  No abdominal pain, nausea, vomiting. His performance status is unchanged. Has not reported any of the fluid retention or any bone pain.  He does not report any headaches or blurry vision. He does not report any syncope or falls. He does not report chest pain, shortness of breath or difficulty breathing. Did not report any genitourinary bleeding or hemoptysis. Remainder of his review of systems unremarkable.  Medications: I have reviewed the patient's current medications.  Current Outpatient Prescriptions  Medication Sig Dispense Refill  . acetaminophen (TYLENOL) 500 MG tablet Take 1,000 mg by mouth every 6 (six) hours as needed for pain.    [name] amLODipine-valsartan (EXFORGE) 10-320 MG per tablet TAKE 1 TABLET BY MOUTH EVERY DAY 90 tablet 1  . aspirin EC 81 MG tablet Take 81 mg by mouth every morning.     [name] atorvastatin (LIPITOR) 10 MG tablet TAKE 1 TABLET BY MOUTH EVERY DAY 90 tablet 1  . calcium carbonate-magnesium hydroxide (ROLAIDS) 334 MG CHEW Chew 1 tablet by mouth 3 (three) times daily with meals.     . Calcium Citrate-Vitamin D (CITRACAL + D PO) Take 1 tablet by mouth daily.    . Denosumab (XGEVA [place]) Inject into the skin every 30 (thirty) days. Injects "around the first part of each month."    . donepezil (ARICEPT) 10 MG tablet TAKE 1 TABLET BY MOUTH ONCE DAILY 90  tablet 1  . enzalutamide (XTANDI) 40 MG capsule Take 4 capsules (160 mg total) by mouth daily. 120 capsule 1  . glimepiride (AMARYL) 2 MG tablet Take 2 mg by mouth daily with breakfast.    . Glucosamine-Chondroitin (GLUCOSAMINE CHONDR COMPLEX) 500-400 MG CAPS Take by mouth daily.    [name] leuprolide (LUPRON) 3.75 MG injection Inject 3.75 mg into the muscle every 4 (four) months.    . levothyroxine (SYNTHROID, LEVOTHROID) 50 MCG tablet TAKE 1 TABLET BY MOUTH EVERY DAY 90 tablet 1  . LYRICA 50 MG capsule TAKE 2 CAPSULES BY  MOUTH EVERY MORNING, THEN TAKE 1 CAPSULE BY MOUTH EVERY EVENING 90 capsule 5  . metFORMIN (GLUCOPHAGE) 1000 MG tablet TAKE 1 TABLET BY MOUTH TWICE DAILY WITH A MEAL 180 tablet 1  . NAMENDA XR 28 MG CP24 TAKE 1 CAPSULE BY MOUTH DAILY 30 capsule 5  . nitroGLYCERIN (NITROSTAT) 0.4 MG SL tablet Place 0.4 mg under the tongue every 5 (five) minutes as needed. For chest pain    . Nutritional Supplements (JUICE PLUS FIBRE PO) Take 6 capsules by mouth daily.     [name] omega-3 acid ethyl esters (LOVAZA) 1 G capsule Take 1 g by mouth once a week.     [name] oxybutynin (DITROPAN-XL) 10 MG 24 hr tablet Take 10 mg by mouth daily.     . pioglitazone (ACTOS) 15 MG tablet Take 1 tablet (15 mg total) by mouth daily. 90 tablet 1  . potassium chloride SA (K-DUR,KLOR-CON) 20 MEQ tablet TAKE 1 TABLET BY MOUTH DAILY 90 tablet 0  . traMADol (ULTRAM) 50 MG tablet TAKE 1 TABLET BY MOUTH EVERY 8 HOURS AS NEEDED 90 tablet 2  . docusate sodium (COLACE) 50 MG capsule Take 50-100 mg by mouth 2 (two) times daily.     . [DISCONTINUED] chlorthalidone (HYGROTON) 25 MG tablet Take 12.5 mg by mouth daily.     No current facility-administered medications for this visit.     Allergies:  Allergies  Allergen Reactions  . Codeine Phosphate Other (See Comments)    mouth sores    Past Medical History, Surgical history, Social history, and Family History were reviewed and updated.    Physical Exam: Blood pressure 140/58, pulse 52, temperature 97.9 F (36.6 C), temperature source Oral, resp. rate 19, height 5\' 7"  (1.702 m), weight 185 lb 11.2 oz (84.233 kg), SpO2 100 %. ECOG: 1 General appearance: alert and cooperative Head: Normocephalic, without obvious abnormality, atraumatic Neck: no adenopathy Lymph nodes: Cervical, supraclavicular, and axillary nodes normal. Heart:regular rate and rhythm, S1, S2 normal, no murmur, click, rub or gallop Lung:chest clear, no wheezing, rales, or dose to percussion Abdomen: soft, non-tender,  without masses or organomegaly EXT:no erythema, induration, or nodules   Lab Results: Lab Results  Component Value Date   WBC 4.8 06/17/2014   HGB 11.5* 06/17/2014   HCT 34.4* 06/17/2014   MCV 95.6 06/17/2014   PLT 161 06/17/2014     Chemistry      Component Value Date/Time   NA 138 06/17/2014 1143   NA 137 02/22/2014 1026   K 4.9 06/17/2014 1143   K 4.3 02/22/2014 1026   CL 101 02/22/2014 1026   CO2 23 06/17/2014 1143   CO2 26 02/22/2014 1026   BUN 38.1* 06/17/2014 1143   BUN 27* 02/22/2014 1026   CREATININE 1.6* 06/17/2014 1143   CREATININE 1.5 02/22/2014 1026   CREATININE 1.38* 02/11/2012 1614      Component Value Date/Time   CALCIUM 9.4  06/17/2014 1143   CALCIUM 10.1 02/22/2014 1026   ALKPHOS 88 06/17/2014 1143   ALKPHOS 54 05/07/2013 1522   AST 16 06/17/2014 1143   AST 22 05/07/2013 1522   ALT 14 06/17/2014 1143   ALT 20 05/07/2013 1522   BILITOT 0.41 06/17/2014 1143   BILITOT 0.7 05/07/2013 1522     Lab Results  Component Value Date   PSA 6.58* 05/18/2014   PSA 6.13* 04/11/2014   PSA 5.58* 03/14/2014     Impression and Plan: This is an 80 year old gentleman with the following issues: 1. 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 His initial diagnosis dates back to 2006 with PSA of 6.26 and a Gleason score of 7. He progressed on [place] and currently on Xtandi. His PSA is relatively stable at this time despite the slight rise. He is reporting no complication from this medication and willing to proceed.  2. Bony disease: He is receiving Xgeva at [facility] urology on a monthly basis.  3. Hormonal deprivation: He is receiving Lupron at [facility] urology every 4 months.  4. Hypokalemia: Resolved. His potassium is back to normal.   5. Weight loss: This has improved at this time.   6. Diarrhea: may have been related to something he ate. Patient will let us know if this persists or worsens  6. Followup: He will return in 4-5 weeks.    Wynetta Emery, Demarus Latterell E, PA-C  12/18/20153:55 PM

## 2014-06-17 NOTE — Patient Instructions (Signed)
Continue Xtandi 160 mg by mouth daily Follow up in 4 to 5 weeks

## 2014-06-18 LAB — PSA: PSA: 8.43 ng/mL — ABNORMAL HIGH (ref <=4.00)

## 2014-07-04 ENCOUNTER — Other Ambulatory Visit: Payer: Self-pay | Admitting: Oncology

## 2014-07-05 ENCOUNTER — Encounter: Payer: Self-pay | Admitting: Internal Medicine

## 2014-07-05 ENCOUNTER — Ambulatory Visit (INDEPENDENT_AMBULATORY_CARE_PROVIDER_SITE_OTHER): Payer: Medicare Other | Admitting: Internal Medicine

## 2014-07-05 VITALS — BP 120/80 | HR 61 | Temp 97.5°F | Resp 18 | Ht 67.0 in | Wt 196.0 lb

## 2014-07-05 DIAGNOSIS — E785 Hyperlipidemia, unspecified: Secondary | ICD-10-CM

## 2014-07-05 DIAGNOSIS — G3184 Mild cognitive impairment, so stated: Secondary | ICD-10-CM

## 2014-07-05 DIAGNOSIS — I2510 Atherosclerotic heart disease of native coronary artery without angina pectoris: Secondary | ICD-10-CM

## 2014-07-05 DIAGNOSIS — Z8546 Personal history of malignant neoplasm of prostate: Secondary | ICD-10-CM

## 2014-07-05 DIAGNOSIS — I10 Essential (primary) hypertension: Secondary | ICD-10-CM

## 2014-07-05 DIAGNOSIS — E0843 Diabetes mellitus due to underlying condition with diabetic autonomic (poly)neuropathy: Secondary | ICD-10-CM

## 2014-07-05 NOTE — Progress Notes (Signed)
Subjective:    Patient ID: [name] [name], male    DOB: 22-Dec-1929, 81 y.o.   MRN: [id number]  HPI  81 year old patient who is seen today in follow-up.  He has type 2 diabetes and Actos added to his regimen.  3 months ago.  There has been a 20 pound weight gain but no peripheral edema. Chief complaint today is left knee pain.  He is followed closely by orthopedics He has hypertension, CAD.  Since his last visit here, he has been seen in follow-up by oncology and neurology  Past Medical History  Diagnosis Date  . Hypertension   . Hyperlipidemia   . Memory loss     mild  . Rectal ulcer     RESOLVED  . Constipation   . History of ventricular fibrillation 1998    while on treadmill  . Hypothyroidism   . IBS (irritable bowel syndrome)   . Colitis     RESOLVED  . ED (erectile dysfunction)   . Osteoporosis   . Depression   . Diabetes mellitus     fasting blood sugar 140-200 usually  . GERD (gastroesophageal reflux disease)   . Heart attack 2009  . Dementia   . Neuromuscular disorder     DIABETIC NEUROPATHY  . Prostate cancer   . Pain     LOWER BACK  -- PAST HX OF STEROID EPIDURALS FOR PAIN RELIEF  . 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 CAD (coronary artery disease)     a. S/P CABG x 5 1998; b.  S/P stent LCX 2009;  c.  LHC 5/09: RCA proximal 90%, 95%, distally occluded, pCFX stents patent, OM1 30%, small left atrial branch 95%, oLAD occluded, S-dRCA patent, L-LAD/Dx patent, EF 60%=> Med Rx  ;  d.  Lex MV 3/13:  EF 63%, no ischemia  e.  Lex Myoview 03/08/13:  EF 56%, inf and apical defect suggestive of thinning and soft tissue attenuation, no ischemia; low risk.      History   Social History  . Marital Status: Widowed    Spouse Name: N/A    Number of Children: 4  . Years of Education: N/A   Occupational History  . retired     Social History Main Topics  . Smoking status: Former Research scientist (life sciences)  . Smokeless tobacco: Former Systems developer    Quit date: 02/11/1948  . Alcohol Use: No  . Drug Use: No  . Sexual Activity: Not on file   Other Topics Concern  . Not on file   Social History Narrative   Right handed, Widowed, 4 kids, Caffeine 2 cups daily, occa soda.  Retired, Chief Executive Officer    Past Surgical History  Procedure Laterality Date  . Coronary artery bypass graft  1998    x5, with a left internal mammary to the LAD and diagonal, vein graft to the OM1 and 2, and a vein graft to the right coronary artery  . Coronary stent placement  11/13/2007    LCX  . Trigger finger repaired  2009  . Laparoscopic gastrectomy N/A 08/18/2012    Procedure: Laparoscopic Partial Gastrectomy;  Surgeon: [name], MD;  Location: [facility];  Service: General;  Laterality: N/A;  Laparoscopic Partial Gastrectomy,    Family History  Problem Relation Age of Onset  . Stroke Father   . Pneumonia Father   . Colon cancer Neg Hx  Allergies  Allergen Reactions  . Codeine Phosphate Other (See Comments)    mouth sores    Current Outpatient Prescriptions on File Prior to Visit  Medication Sig Dispense Refill  . acetaminophen (TYLENOL) 500 MG tablet Take 1,000 mg by mouth every 6 (six) hours as needed for pain.    [name] amLODipine-valsartan (EXFORGE) 10-320 MG per tablet TAKE 1 TABLET BY MOUTH EVERY DAY 90 tablet 1  . aspirin EC 81 MG tablet Take 81 mg by mouth every morning.     [name] atorvastatin (LIPITOR) 10 MG tablet TAKE 1 TABLET BY MOUTH EVERY DAY 90 tablet 1  . calcium carbonate-magnesium hydroxide (ROLAIDS) 334 MG CHEW Chew 1 tablet by mouth 3 (three) times daily with meals.     . Calcium Citrate-Vitamin D (CITRACAL + D PO) Take 1 tablet by mouth daily.    . Denosumab (XGEVA [place]) Inject into the skin every 30 (thirty) days. Injects "around the first part of each month."    . donepezil (ARICEPT) 10 MG tablet TAKE 1 TABLET BY MOUTH ONCE DAILY 90  tablet 1  . enzalutamide (XTANDI) 40 MG capsule Take 4 capsules (160 mg total) by mouth daily. 120 capsule 1  . glimepiride (AMARYL) 2 MG tablet Take 2 mg by mouth daily with breakfast.    . Glucosamine-Chondroitin (GLUCOSAMINE CHONDR COMPLEX) 500-400 MG CAPS Take by mouth daily.    [name] leuprolide (LUPRON) 3.75 MG injection Inject 3.75 mg into the muscle every 4 (four) months.    . levothyroxine (SYNTHROID, LEVOTHROID) 50 MCG tablet TAKE 1 TABLET BY MOUTH EVERY DAY 90 tablet 1  . LYRICA 50 MG capsule TAKE 2 CAPSULES BY MOUTH EVERY MORNING, THEN TAKE 1 CAPSULE BY MOUTH EVERY EVENING 90 capsule 5  . metFORMIN (GLUCOPHAGE) 1000 MG tablet TAKE 1 TABLET BY MOUTH TWICE DAILY WITH A MEAL 180 tablet 1  . NAMENDA XR 28 MG CP24 TAKE 1 CAPSULE BY MOUTH DAILY 30 capsule 5  . nitroGLYCERIN (NITROSTAT) 0.4 MG SL tablet Place 0.4 mg under the tongue every 5 (five) minutes as needed. For chest pain    . Nutritional Supplements (JUICE PLUS FIBRE PO) Take 6 capsules by mouth daily.     [name] omega-3 acid ethyl esters (LOVAZA) 1 G capsule Take 1 g by mouth once a week.     [name] oxybutynin (DITROPAN-XL) 10 MG 24 hr tablet Take 10 mg by mouth daily.     . pioglitazone (ACTOS) 15 MG tablet Take 1 tablet (15 mg total) by mouth daily. 90 tablet 1  . potassium chloride SA (K-DUR,KLOR-CON) 20 MEQ tablet TAKE 1 TABLET BY MOUTH DAILY 90 tablet 0  . traMADol (ULTRAM) 50 MG tablet TAKE 1 TABLET BY MOUTH EVERY 8 HOURS AS NEEDED 90 tablet 2  . [DISCONTINUED] chlorthalidone (HYGROTON) 25 MG tablet Take 12.5 mg by mouth daily.     No current facility-administered medications on file prior to visit.    BP 120/80 mmHg  Pulse 61  Temp(Src) 97.5 F (36.4 C) (Oral)  Resp 18  Ht 5\' 7"  (1.702 m)  Wt 196 lb (88.905 kg)  BMI 30.69 kg/m2  SpO2 98%     Review of Systems  Constitutional: Positive for appetite change, fatigue and unexpected weight change. Negative for fever and chills.  HENT: Negative for congestion, dental problem,  ear pain, hearing loss, sore throat, tinnitus, trouble swallowing and voice change.   Eyes: Negative for pain, discharge and visual disturbance.  Respiratory: Negative for cough, chest tightness, wheezing and  stridor.   Cardiovascular: Negative for chest pain, palpitations and leg swelling.  Gastrointestinal: Negative for nausea, vomiting, abdominal pain, diarrhea, constipation, blood in stool and abdominal distention.  Genitourinary: Negative for urgency, hematuria, flank pain, discharge, difficulty urinating and genital sores.  Musculoskeletal: Positive for joint swelling, arthralgias and gait problem. Negative for myalgias, back pain and neck stiffness.  Skin: Negative for rash.  Neurological: Positive for weakness. Negative for dizziness, syncope, speech difficulty, numbness and headaches.  Hematological: Negative for adenopathy. Does not bruise/bleed easily.  Psychiatric/Behavioral: Negative for behavioral problems and dysphoric mood. The patient is not nervous/anxious.        Objective:   Physical Exam  Constitutional: He is oriented to person, place, and time. He appears well-developed.  Blood pressure well controlled  HENT:  Head: Normocephalic.  Right Ear: External ear normal.  Left Ear: External ear normal.  Eyes: Conjunctivae and EOM are normal.  Neck: Normal range of motion.  Cardiovascular: Normal rate and normal heart sounds.   Pulmonary/Chest: Breath sounds normal.  Abdominal: Bowel sounds are normal.  Musculoskeletal: Normal range of motion. He exhibits no edema or tenderness.  Left knee with effusion and warm to touch  Neurological: He is alert and oriented to person, place, and time.  Psychiatric: He has a normal mood and affect. His behavior is normal.          Assessment & Plan:   Diabetes mellitus.  Will check a hemoglobin A1c Cognitive impairment, stable CAD stable Hypertension, well-controlled Metastatic prostate cancer.  Follow-up oncology and  urology Osteoarthritis left knee.  Follow-up orthopedics  Recheck 3 months

## 2014-07-05 NOTE — Progress Notes (Signed)
Pre visit review using our clinic review tool, if applicable. No additional management support is needed unless otherwise documented below in the visit note. 

## 2014-07-05 NOTE — Patient Instructions (Signed)
Limit your sodium (Salt) intake   Please check your hemoglobin A1c every 3 months   

## 2014-07-06 ENCOUNTER — Other Ambulatory Visit: Payer: Self-pay | Admitting: *Deleted

## 2014-07-06 DIAGNOSIS — C61 Malignant neoplasm of prostate: Secondary | ICD-10-CM

## 2014-07-06 LAB — HEMOGLOBIN A1C: HEMOGLOBIN A1C: 8.1 % — AB (ref 4.6–6.5)

## 2014-07-06 MED ORDER — ENZALUTAMIDE 40 MG PO CAPS: 160.0000 mg | ORAL_CAPSULE | Freq: Every day | ORAL | Status: DC

## 2014-07-06 NOTE — Telephone Encounter (Signed)
Prescription e-scribed in error to [facility] on patient's medication list. Called and left voicemail for pharmacy that it was sent in error and to cancel script. Script already faxed to [place] to be filled.

## 2014-07-11 ENCOUNTER — Other Ambulatory Visit: Payer: Self-pay | Admitting: Internal Medicine

## 2014-07-18 ENCOUNTER — Telehealth: Payer: Self-pay | Admitting: Internal Medicine

## 2014-07-18 ENCOUNTER — Other Ambulatory Visit: Payer: Self-pay | Admitting: Internal Medicine

## 2014-07-18 NOTE — Telephone Encounter (Signed)
Dr. Raliegh Ip, pt and daughter wanting to know if he has to have a Colonoscopy?

## 2014-07-18 NOTE — Telephone Encounter (Signed)
Pt daughter is calling questioning metformin. Should metformin be 500 mg twice a day or 1000 mg twice a day please verify. Pt also received a letter stating it is time for colonoscopy. Pt would like dr Raliegh Ip input should he get colonoscopy?

## 2014-07-18 NOTE — Telephone Encounter (Signed)
If patient can tolerate 1000 mg twice daily, continue this dose.  If this dose is not well tolerated, Then 500 mg twice daily  Agree we can defer screening colonoscopy and schedule in the future only if needed as a diagnostic test

## 2014-07-18 NOTE — Telephone Encounter (Signed)
Spoke to pt, told him he is suppose to be taking Metformin 1000 mg twice a day. Pt verbalized understanding. Told pt I will get back to him about colonoscopy. Pt verbalized understanding.

## 2014-07-19 NOTE — Telephone Encounter (Signed)
Spoke to pt, told him Dr. Raliegh Ip said he agrees we can defer screening colonoscopy and schedule in the future only if needed as a diagnostic test. Pt verbalized understanding.

## 2014-07-22 ENCOUNTER — Telehealth: Payer: Self-pay | Admitting: Oncology

## 2014-07-22 ENCOUNTER — Other Ambulatory Visit: Payer: Medicare Other

## 2014-07-22 ENCOUNTER — Ambulatory Visit: Payer: Medicare Other | Admitting: Oncology

## 2014-07-22 ENCOUNTER — Other Ambulatory Visit: Payer: Self-pay | Admitting: *Deleted

## 2014-07-22 NOTE — Telephone Encounter (Signed)
Confirm appointment rescheduled from 1/22 to 1/29

## 2014-07-29 ENCOUNTER — Other Ambulatory Visit (HOSPITAL_BASED_OUTPATIENT_CLINIC_OR_DEPARTMENT_OTHER): Payer: Medicare Other

## 2014-07-29 ENCOUNTER — Encounter: Payer: Self-pay | Admitting: Physician Assistant

## 2014-07-29 ENCOUNTER — Ambulatory Visit: Payer: Self-pay | Admitting: Physician Assistant

## 2014-07-29 ENCOUNTER — Telehealth: Payer: Self-pay | Admitting: Oncology

## 2014-07-29 ENCOUNTER — Other Ambulatory Visit: Payer: Medicare Other

## 2014-07-29 ENCOUNTER — Ambulatory Visit (HOSPITAL_BASED_OUTPATIENT_CLINIC_OR_DEPARTMENT_OTHER): Payer: Medicare Other | Admitting: Physician Assistant

## 2014-07-29 VITALS — BP 167/58 | HR 56 | Temp 97.4°F | Resp 18 | Ht 67.0 in | Wt 193.1 lb

## 2014-07-29 DIAGNOSIS — E291 Testicular hypofunction: Secondary | ICD-10-CM

## 2014-07-29 DIAGNOSIS — C61 Malignant neoplasm of prostate: Secondary | ICD-10-CM

## 2014-07-29 DIAGNOSIS — C7951 Secondary malignant neoplasm of bone: Secondary | ICD-10-CM

## 2014-07-29 DIAGNOSIS — M899 Disorder of bone, unspecified: Secondary | ICD-10-CM

## 2014-07-29 DIAGNOSIS — R634 Abnormal weight loss: Secondary | ICD-10-CM

## 2014-07-29 LAB — COMPREHENSIVE METABOLIC PANEL (CC13)
ALBUMIN: 3.8 g/dL (ref 3.5–5.0)
ALK PHOS: 101 U/L (ref 40–150)
ALT: 12 U/L (ref 0–55)
AST: 14 U/L (ref 5–34)
Anion Gap: 13 mEq/L — ABNORMAL HIGH (ref 3–11)
BILIRUBIN TOTAL: 0.51 mg/dL (ref 0.20–1.20)
BUN: 27.2 mg/dL — AB (ref 7.0–26.0)
CHLORIDE: 107 meq/L (ref 98–109)
CO2: 24 meq/L (ref 22–29)
CREATININE: 1.5 mg/dL — AB (ref 0.7–1.3)
Calcium: 11.2 mg/dL — ABNORMAL HIGH (ref 8.4–10.4)
EGFR: 43 mL/min/{1.73_m2} — ABNORMAL LOW (ref >90)
GLUCOSE: 110 mg/dL (ref 70–140)
POTASSIUM: 4.7 meq/L (ref 3.5–5.1)
Sodium: 144 mEq/L (ref 136–145)
TOTAL PROTEIN: 7.1 g/dL (ref 6.4–8.3)

## 2014-07-29 LAB — CBC WITH DIFFERENTIAL/PLATELET
BASO%: 0.6 % (ref 0.0–2.0)
Basophils Absolute: 0 10*3/uL (ref 0.0–0.1)
EOS%: 5.6 % (ref 0.0–7.0)
Eosinophils Absolute: 0.2 10*3/uL (ref 0.0–0.5)
HCT: 37.2 % — ABNORMAL LOW (ref 38.4–49.9)
HEMOGLOBIN: 12.1 g/dL — AB (ref 13.0–17.1)
LYMPH%: 24.2 % (ref 14.0–49.0)
MCH: 31 pg (ref 27.2–33.4)
MCHC: 32.6 g/dL (ref 32.0–36.0)
MCV: 95.2 fL (ref 79.3–98.0)
MONO#: 0.4 10*3/uL (ref 0.1–0.9)
MONO%: 8.7 % (ref 0.0–14.0)
NEUT%: 60.9 % (ref 39.0–75.0)
NEUTROS ABS: 2.6 10*3/uL (ref 1.5–6.5)
Platelets: 174 10*3/uL (ref 140–400)
RBC: 3.91 10*6/uL — ABNORMAL LOW (ref 4.20–5.82)
RDW: 14.1 % (ref 11.0–14.6)
WBC: 4.2 10*3/uL (ref 4.0–10.3)
lymph#: 1 10*3/uL (ref 0.9–3.3)

## 2014-07-29 MED ORDER — ENZALUTAMIDE 40 MG PO CAPS: 160.0000 mg | ORAL_CAPSULE | Freq: Every day | ORAL | Status: DC

## 2014-07-29 NOTE — Progress Notes (Signed)
Hematology and Oncology Follow Up Visit  [name] [name] [id number] 1930-03-16 81 y.o. 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 His initial diagnosis was in 2006 when he presented with a PSA of 6.26 the Gleason score 4+3 equals 7.  Prior Therapy:  He is status post definitive therapy with radiation and androgen deprivation completed in 2007. The patient developed recurrent disease in 2012 with a PSA rise up to 28 and T9 osseous metastasis and was treated with combined androgen deprivation with Lupron and Casodex. He had an excellent response with his PSA nadir down to 0.19. Most recently he developed a rise in his PSA up to 3.06 in July 2014 despite castrate levels of testosterone. His bone scan in July 2014 confirmed the presence of bony disease. Zytiga 1000 mg daily started in July 2014 till July 2015.   Current therapy:  Xtandi 160 mg daily started in July of 2015. He receives Niger monthly at [facility] u[facility]. He receives Lupron every 4 months at [facility].  Interim History:  [name] [name] returns today for a followup visit accompanied by his daughter. Since the last visit, he has been doing well.  He is tolerating his Xtandi without difficulty. He receives his Delton See and Lupron from [facility] and at the care of Dr. Diona Fanti. He is not reporting any new bone pain at this time. He does have ongoing right knee and right shoulder pain due to arthritis. This is unchanged .  No abdominal pain, nausea, vomiting. His performance status is unchanged. Has not reported any of the fluid retention or any bone pain.  He does not report any headaches or blurry vision. He does not report any syncope or falls. He does not report chest pain, shortness of breath or difficulty breathing. Did not report any genitourinary bleeding or hemoptysis.  Remainder of his review of systems unremarkable.  Medications: I have reviewed the patient's current medications.  Current Outpatient Prescriptions  Medication Sig Dispense Refill  . acetaminophen (TYLENOL) 500 MG tablet Take 1,000 mg by mouth every 6 (six) hours as needed for pain.    [name] amLODipine-valsartan (EXFORGE) 10-320 MG per tablet TAKE 1 TABLET BY MOUTH EVERY DAY 90 tablet 1  . aspirin EC 81 MG tablet Take 81 mg by mouth every morning.     [name] atorvastatin (LIPITOR) 10 MG tablet TAKE 1 TABLET BY MOUTH EVERY DAY 90 tablet 1  . calcium carbonate-magnesium hydroxide (ROLAIDS) 334 MG CHEW Chew 1 tablet by mouth 3 (three) times daily with meals.     . Calcium Citrate-Vitamin D (CITRACAL + D PO) Take 1 tablet by mouth daily.    . Denosumab (XGEVA [place]) Inject into the skin every 30 (thirty) days. Injects "around the first part of each month."    . donepezil (ARICEPT) 10 MG tablet TAKE 1 TABLET BY MOUTH ONCE DAILY 90 tablet 1  . enzalutamide (XTANDI) 40 MG capsule Take 4 capsules (160 mg total) by mouth daily. 120 capsule 0  . glimepiride (AMARYL) 2 MG tablet Take 2 mg by mouth daily with breakfast.    . Glucosamine-Chondroitin (GLUCOSAMINE CHONDR COMPLEX) 500-400 MG CAPS Take by mouth daily.    [name] leuprolide (LUPRON) 3.75 MG injection Inject 3.75 mg into the muscle every 4 (four) months.    . levothyroxine (SYNTHROID, LEVOTHROID) 50 MCG tablet TAKE 1 TABLET BY MOUTH EVERY DAY 90 tablet 1  .  LYRICA 50 MG capsule TAKE 2 CAPSULES BY MOUTH EVERY MORNING, THEN TAKE 1 CAPSULE BY MOUTH EVERY EVENING 90 capsule 5  . metFORMIN (GLUCOPHAGE) 1000 MG tablet TAKE 1 TABLET BY MOUTH TWICE DAILY WITH A MEAL 180 tablet 1  . NAMENDA XR 28 MG CP24 TAKE 1 CAPSULE BY MOUTH DAILY 30 capsule 5  . Nutritional Supplements (JUICE PLUS FIBRE PO) Take 6 capsules by mouth daily.     [name] omega-3 acid ethyl esters (LOVAZA) 1 G capsule Take 1 g by mouth once a week.     [name] oxybutynin (DITROPAN-XL) 10 MG 24 hr tablet Take 10 mg by  mouth daily.     . pioglitazone (ACTOS) 15 MG tablet TAKE 1 TABLET BY MOUTH DAILY 90 tablet 1  . potassium chloride SA (K-DUR,KLOR-CON) 20 MEQ tablet TAKE 1 TABLET BY MOUTH DAILY 90 tablet 0  . nitroGLYCERIN (NITROSTAT) 0.4 MG SL tablet Place 0.4 mg under the tongue every 5 (five) minutes as needed. For chest pain    . traMADol (ULTRAM) 50 MG tablet TAKE 1 TABLET BY MOUTH EVERY 8 HOURS AS NEEDED (Patient not taking: Reported on 07/29/2014) 90 tablet 2  . [DISCONTINUED] chlorthalidone (HYGROTON) 25 MG tablet Take 12.5 mg by mouth daily.     No current facility-administered medications for this visit.     Allergies:  Allergies  Allergen Reactions  . Codeine Phosphate Other (See Comments)    mouth sores    Past Medical History, Surgical history, Social history, and Family History were reviewed and updated.    Physical Exam: Blood pressure 167/58, pulse 56, temperature 97.4 F (36.3 C), temperature source Oral, resp. rate 18, height 5\' 7"  (1.702 m), weight 193 lb 1.6 oz (87.59 kg), SpO2 100 %. ECOG: 1 General appearance: alert and cooperative Head: Normocephalic, without obvious abnormality, atraumatic Neck: no adenopathy Lymph nodes: Cervical, supraclavicular, and axillary nodes normal. Heart:regular rate and rhythm, S1, S2 normal, no murmur, click, rub or gallop Lung:chest clear, no wheezing, rales, or dose to percussion Abdomen: soft, non-tender, without masses or organomegaly EXT:no erythema, induration, or nodules   Lab Results: Lab Results  Component Value Date   WBC 4.2 07/29/2014   HGB 12.1* 07/29/2014   HCT 37.2* 07/29/2014   MCV 95.2 07/29/2014   PLT 174 07/29/2014     Chemistry      Component Value Date/Time   NA 144 07/29/2014 1157   NA 137 02/22/2014 1026   K 4.7 07/29/2014 1157   K 4.3 02/22/2014 1026   CL 101 02/22/2014 1026   CO2 24 07/29/2014 1157   CO2 26 02/22/2014 1026   BUN 27.2* 07/29/2014 1157   BUN 27* 02/22/2014 1026   CREATININE 1.5*  07/29/2014 1157   CREATININE 1.5 02/22/2014 1026   CREATININE 1.38* 02/11/2012 1614      Component Value Date/Time   CALCIUM 11.2* 07/29/2014 1157   CALCIUM 10.1 02/22/2014 1026   ALKPHOS 101 07/29/2014 1157   ALKPHOS 54 05/07/2013 1522   AST 14 07/29/2014 1157   AST 22 05/07/2013 1522   ALT 12 07/29/2014 1157   ALT 20 05/07/2013 1522   BILITOT 0.51 07/29/2014 1157   BILITOT 0.7 05/07/2013 1522     Lab Results  Component Value Date   PSA 8.43* 06/17/2014   PSA 6.58* 05/18/2014   PSA 6.13* 04/11/2014     Impression and Plan: This is an 81 year old gentleman with the following issues: 1. 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 His initial  diagnosis dates back to 2006 with PSA of 6.26 and a Gleason score of 7. He progressed on [place] and currently on Xtandi. His PSA is relatively stable at this time despite the continued slight rise. He is reporting no complication from this medication and willing to proceed.  2. Bony disease: He is receiving Xgeva at [facility] urology on a monthly basis.  3. Hormonal deprivation: He is receiving Lupron at [facility] urology every 4 months.  4. Hypokalemia: Resolved. His potassium is back to normal.   5. Weight loss: This has improved at this time and is relatively stable.   6. Followup: He will return in 4-5 weeks.   Carlton Adam, PA-C  1/29/20164:16 PM

## 2014-07-29 NOTE — Telephone Encounter (Signed)
gv adn printed appt sched and avs for pt for

## 2014-07-30 LAB — PSA: PSA: 10.31 ng/mL — ABNORMAL HIGH (ref <=4.00)

## 2014-08-01 NOTE — Patient Instructions (Signed)
Continue Xtandi at the current dose Follow up in 4 to 5 weeks

## 2014-08-24 ENCOUNTER — Telehealth: Payer: Self-pay | Admitting: Internal Medicine

## 2014-08-24 MED ORDER — GLUCOSE BLOOD VI STRP: ORAL_STRIP | Status: DC

## 2014-08-24 NOTE — Telephone Encounter (Signed)
Pt notified Rx for test strips sent to pharmacy.

## 2014-08-24 NOTE — Telephone Encounter (Signed)
Patient need re-fill on Accu-Chek test strips sent to [facility] on [facility].

## 2014-08-26 ENCOUNTER — Other Ambulatory Visit (HOSPITAL_BASED_OUTPATIENT_CLINIC_OR_DEPARTMENT_OTHER): Payer: Medicare Other

## 2014-08-26 ENCOUNTER — Ambulatory Visit (HOSPITAL_BASED_OUTPATIENT_CLINIC_OR_DEPARTMENT_OTHER): Payer: Medicare Other | Admitting: Oncology

## 2014-08-26 ENCOUNTER — Telehealth: Payer: Self-pay | Admitting: Oncology

## 2014-08-26 VITALS — BP 176/59 | HR 46 | Temp 97.5°F | Resp 18 | Ht 67.0 in | Wt 193.3 lb

## 2014-08-26 DIAGNOSIS — C61 Malignant neoplasm of prostate: Secondary | ICD-10-CM

## 2014-08-26 DIAGNOSIS — C7951 Secondary malignant neoplasm of bone: Secondary | ICD-10-CM

## 2014-08-26 LAB — COMPREHENSIVE METABOLIC PANEL (CC13)
ALK PHOS: 96 U/L (ref 40–150)
ALT: 9 U/L (ref 0–55)
AST: 13 U/L (ref 5–34)
Albumin: 3.8 g/dL (ref 3.5–5.0)
Anion Gap: 11 mEq/L (ref 3–11)
BUN: 29.6 mg/dL — ABNORMAL HIGH (ref 7.0–26.0)
CO2: 24 mEq/L (ref 22–29)
Calcium: 10.3 mg/dL (ref 8.4–10.4)
Chloride: 108 mEq/L (ref 98–109)
Creatinine: 1.4 mg/dL — ABNORMAL HIGH (ref 0.7–1.3)
EGFR: 45 mL/min/{1.73_m2} — ABNORMAL LOW (ref >90)
GLUCOSE: 93 mg/dL (ref 70–140)
POTASSIUM: 4.6 meq/L (ref 3.5–5.1)
SODIUM: 143 meq/L (ref 136–145)
TOTAL PROTEIN: 6.8 g/dL (ref 6.4–8.3)
Total Bilirubin: 0.46 mg/dL (ref 0.20–1.20)

## 2014-08-26 LAB — CBC WITH DIFFERENTIAL/PLATELET
BASO%: 1 % (ref 0.0–2.0)
Basophils Absolute: 0 10*3/uL (ref 0.0–0.1)
EOS%: 6 % (ref 0.0–7.0)
Eosinophils Absolute: 0.2 10*3/uL (ref 0.0–0.5)
HEMATOCRIT: 36.5 % — AB (ref 38.4–49.9)
HGB: 11.9 g/dL — ABNORMAL LOW (ref 13.0–17.1)
LYMPH#: 0.9 10*3/uL (ref 0.9–3.3)
LYMPH%: 27 % (ref 14.0–49.0)
MCH: 30.9 pg (ref 27.2–33.4)
MCHC: 32.5 g/dL (ref 32.0–36.0)
MCV: 95.1 fL (ref 79.3–98.0)
MONO#: 0.3 10*3/uL (ref 0.1–0.9)
MONO%: 9.3 % (ref 0.0–14.0)
NEUT%: 56.7 % (ref 39.0–75.0)
NEUTROS ABS: 2 10*3/uL (ref 1.5–6.5)
PLATELETS: 181 10*3/uL (ref 140–400)
RBC: 3.84 10*6/uL — ABNORMAL LOW (ref 4.20–5.82)
RDW: 14.5 % (ref 11.0–14.6)
WBC: 3.5 10*3/uL — ABNORMAL LOW (ref 4.0–10.3)

## 2014-08-26 NOTE — Progress Notes (Signed)
Hematology and Oncology Follow Up Visit  [name] [name] [id number] [birth year] 81 y.o. 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 His initial diagnosis was in 2006 when he presented with a PSA of 6.26 the Gleason score 4+3 equals 7.  Prior Therapy:  He is status post definitive therapy with radiation and androgen deprivation completed in 2007. The patient developed recurrent disease in 2012 with a PSA rise up to 28 and T9 osseous metastasis and was treated with combined androgen deprivation with Lupron and Casodex. He had an excellent response with his PSA nadir down to 0.19. Most recently he developed a rise in his PSA up to 3.06 in July 2014 despite castrate levels of testosterone. His bone scan in July 2014 confirmed the presence of bony disease. Zytiga 1000 mg daily started in July 2014 till July 2015.   Current therapy:  Xtandi 160 mg daily started in July of 2015. He receives Niger monthly at [facility]. He receives Lupron every 4 months at [facility].  Interim History:  [name] [name] returns today for a followup visit accompanied by his daughter. Since the last visit, he reports no new complaints.  He reports he is eating well and have actually gained more weight. He is tolerating his Xtandi without difficulty. He receives his Delton See and Lupron from [facility] and at the care of Dr. Diona [name]. He is not reporting any new bone pain at this time. He does have ongoing right knee and right shoulder pain due to arthritis which is unchanged . He does not report any lower extremity edema or difficulty breathing. No abdominal pain, nausea, vomiting. His performance status is unchanged. Has not reported any of the fluid retention or any bone pain.  He does not report any headaches or blurry vision. He does not report any syncope or  falls. He does not report chest pain, shortness of breath or difficulty breathing. Did not report any genitourinary bleeding or hemoptysis. Remainder of his review of systems unremarkable.  Medications: I have reviewed the patient's current medications.  Current Outpatient Prescriptions  Medication Sig Dispense Refill  . acetaminophen (TYLENOL) 500 MG tablet Take 1,000 mg by mouth every 6 (six) hours as needed for pain.    [name] amLODipine-valsartan (EXFORGE) 10-320 MG per tablet TAKE 1 TABLET BY MOUTH EVERY DAY 90 tablet 1  . aspirin EC 81 MG tablet Take 81 mg by mouth every morning.     [name] atorvastatin (LIPITOR) 10 MG tablet TAKE 1 TABLET BY MOUTH EVERY DAY 90 tablet 1  . calcium carbonate-magnesium hydroxide (ROLAIDS) 334 MG CHEW Chew 1 tablet by mouth 3 (three) times daily with meals.     . Calcium Citrate-Vitamin D (CITRACAL + D PO) Take 1 tablet by mouth daily.    . Denosumab (XGEVA [place]) Inject into the skin every 30 (thirty) days. Injects "around the first part of each month."    . donepezil (ARICEPT) 10 MG tablet TAKE 1 TABLET BY MOUTH ONCE DAILY 90 tablet 1  . enzalutamide (XTANDI) 40 MG capsule Take 4 capsules (160 mg total) by mouth daily. 120 capsule 0  . glimepiride (AMARYL) 2 MG tablet Take 2 mg by mouth daily with breakfast.    . Glucosamine-Chondroitin (GLUCOSAMINE CHONDR COMPLEX) 500-400 MG CAPS Take by mouth daily.    [name] glucose blood (ACCU-CHEK AVIVA) test strip USE TO CHECK BLOOD SUGAR DAILY AND  PRN 100 each 12  . leuprolide (LUPRON) 3.75 MG injection Inject 3.75 mg into the muscle every 4 (four) months.    . levothyroxine (SYNTHROID, LEVOTHROID) 50 MCG tablet TAKE 1 TABLET BY MOUTH EVERY DAY 90 tablet 1  . LYRICA 50 MG capsule TAKE 2 CAPSULES BY MOUTH EVERY MORNING, THEN TAKE 1 CAPSULE BY MOUTH EVERY EVENING 90 capsule 5  . metFORMIN (GLUCOPHAGE) 1000 MG tablet TAKE 1 TABLET BY MOUTH TWICE DAILY WITH A MEAL 180 tablet 1  . NAMENDA XR 28 MG CP24 TAKE 1 CAPSULE BY MOUTH DAILY 30  capsule 5  . nitroGLYCERIN (NITROSTAT) 0.4 MG SL tablet Place 0.4 mg under the tongue every 5 (five) minutes as needed. For chest pain    . Nutritional Supplements (JUICE PLUS FIBRE PO) Take 6 capsules by mouth daily.     [name] omega-3 acid ethyl esters (LOVAZA) 1 G capsule Take 1 g by mouth once a week.     . pioglitazone (ACTOS) 15 MG tablet TAKE 1 TABLET BY MOUTH DAILY 90 tablet 1  . traMADol (ULTRAM) 50 MG tablet TAKE 1 TABLET BY MOUTH EVERY 8 HOURS AS NEEDED 90 tablet 2  . [DISCONTINUED] chlorthalidone (HYGROTON) 25 MG tablet Take 12.5 mg by mouth daily.     No current facility-administered medications for this visit.     Allergies:  Allergies  Allergen Reactions  . Codeine Phosphate Other (See Comments)    mouth sores    Past Medical History, Surgical history, Social history, and Family History were reviewed and updated.    Physical Exam: Blood pressure 176/59, pulse 46, temperature 97.5 F (36.4 C), temperature source Oral, resp. rate 18, height 5\' 7"  (1.702 m), weight 193 lb 4.8 oz (87.68 kg), SpO2 100 %. ECOG: 1 General appearance: alert and cooperative not in any distress. Head: Normocephalic, without obvious abnormality Neck: no adenopathy Lymph nodes: Cervical, supraclavicular, and axillary nodes normal. Heart:regular rate and rhythm, S1, S2 normal, no murmur, click, rub or gallop Lung:chest clear, no wheezing, rales, or dose to percussion Abdomen: soft, non-tender, without masses or organomegaly EXT:no erythema, induration, or nodules   Lab Results: Lab Results  Component Value Date   WBC 3.5* 08/26/2014   HGB 11.9* 08/26/2014   HCT 36.5* 08/26/2014   MCV 95.1 08/26/2014   PLT 181 08/26/2014     Chemistry      Component Value Date/Time   NA 143 08/26/2014 1255   NA 137 02/22/2014 1026   K 4.6 08/26/2014 1255   K 4.3 02/22/2014 1026   CL 101 02/22/2014 1026   CO2 24 08/26/2014 1255   CO2 26 02/22/2014 1026   BUN 29.6* 08/26/2014 1255   BUN 27*  02/22/2014 1026   CREATININE 1.4* 08/26/2014 1255   CREATININE 1.5 02/22/2014 1026   CREATININE 1.38* 02/11/2012 1614      Component Value Date/Time   CALCIUM 10.3 08/26/2014 1255   CALCIUM 10.1 02/22/2014 1026   ALKPHOS 96 08/26/2014 1255   ALKPHOS 54 05/07/2013 1522   AST 13 08/26/2014 1255   AST 22 05/07/2013 1522   ALT 9 08/26/2014 1255   ALT 20 05/07/2013 1522   BILITOT 0.46 08/26/2014 1255   BILITOT 0.7 05/07/2013 1522     Lab Results  Component Value Date   PSA 10.31* 07/29/2014   PSA 8.43* 06/17/2014   PSA 6.58* 05/18/2014     Impression and Plan: This is an 81 year old gentleman with the following issues: 1. Castration resistant prostate cancer with disease to  the bone. His initial diagnosis dates back to 2006 with PSA of 6.26 and a Gleason score of 7. He progressed on [place] and currently on Xtandi. His PSA has been slowly on the rise now up to 10.3 since the last visit. The plan at this point to continue on xtandi and restaging with a bone scan before the next visit. Options for salvage therapy were discussed today including chemotherapy or Xofigo. For the time being, he is asymptomatic from his disease and his cancer progression has been very minimal and we'll continue with the current dose and schedule.  2. Bony disease: He is receiving Xgeva at [facility] urology on a monthly basis.  3. Hormonal deprivation: He is receiving Lupron at [facility] urology every 4 months.  4. Hypokalemia: Resolved. His potassium is back to normal.   5. Weight loss: He is actually gaining weight at this time.  6. Followup: He will return in 4-5 weeks.   Zola Button, MD 2/26/20161:38 PM

## 2014-08-26 NOTE — Telephone Encounter (Signed)
Pt confirmed labs/ov per 02/26 POF, gave pt AVS..... KJ °

## 2014-08-27 LAB — PSA: PSA: 12.27 ng/mL — ABNORMAL HIGH (ref <=4.00)

## 2014-08-29 ENCOUNTER — Other Ambulatory Visit: Payer: Self-pay | Admitting: Oncology

## 2014-08-29 NOTE — Telephone Encounter (Signed)
08-26-2014 clinic note reads to continue at same dose and schedule.

## 2014-08-30 ENCOUNTER — Other Ambulatory Visit: Payer: Self-pay | Admitting: *Deleted

## 2014-08-30 MED ORDER — ENZALUTAMIDE 40 MG PO CAPS: 160.0000 mg | ORAL_CAPSULE | Freq: Every day | ORAL | Status: DC

## 2014-09-21 ENCOUNTER — Encounter: Payer: Self-pay | Admitting: Internal Medicine

## 2014-09-21 ENCOUNTER — Ambulatory Visit (INDEPENDENT_AMBULATORY_CARE_PROVIDER_SITE_OTHER): Payer: Medicare Other | Admitting: Internal Medicine

## 2014-09-21 VITALS — BP 144/70 | HR 52 | Temp 97.5°F | Resp 18 | Ht 67.0 in | Wt 200.0 lb

## 2014-09-21 DIAGNOSIS — B9789 Other viral agents as the cause of diseases classified elsewhere: Secondary | ICD-10-CM

## 2014-09-21 DIAGNOSIS — J069 Acute upper respiratory infection, unspecified: Secondary | ICD-10-CM | POA: Diagnosis not present

## 2014-09-21 DIAGNOSIS — I10 Essential (primary) hypertension: Secondary | ICD-10-CM | POA: Diagnosis not present

## 2014-09-21 DIAGNOSIS — R5383 Other fatigue: Secondary | ICD-10-CM

## 2014-09-21 DIAGNOSIS — E0842 Diabetes mellitus due to underlying condition with diabetic polyneuropathy: Secondary | ICD-10-CM

## 2014-09-21 MED ORDER — DICLOFENAC SODIUM 1 % TD GEL: 4.0000 g | Freq: Four times a day (QID) | TRANSDERMAL | Status: DC

## 2014-09-21 NOTE — Progress Notes (Signed)
Pre visit review using our clinic review tool, if applicable. No additional management support is needed unless otherwise documented below in the visit note. 

## 2014-09-21 NOTE — Progress Notes (Signed)
Subjective:    Patient ID: [name] [name], male    DOB: [birth year], 81 y.o.   MRN: [id number]  HPI  81 year old patient who presents with a chief complaint of cough, sore throat, chest congestion and occasional wheezing.  There is been no fever.  He has been followed closely by oncology due to prostate cancer. He has type 2 diabetes.  Presently controlled on triple oral therapy.  No hypoglycemia  Past Medical History  Diagnosis Date  . Hypertension   . Hyperlipidemia   . Memory loss     mild  . Rectal ulcer     RESOLVED  . Constipation   . History of ventricular fibrillation 1998    while on treadmill  . Hypothyroidism   . IBS (irritable bowel syndrome)   . Colitis     RESOLVED  . ED (erectile dysfunction)   . Osteoporosis   . Depression   . Diabetes mellitus     fasting blood sugar 140-200 usually  . GERD (gastroesophageal reflux disease)   . Heart attack 2009  . Dementia   . Neuromuscular disorder     DIABETIC NEUROPATHY  . Prostate cancer   . Pain     LOWER BACK  -- PAST HX OF STEROID EPIDURALS FOR PAIN RELIEF  . 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 CAD (coronary artery disease)     a. S/P CABG x 5 1998; b.  S/P stent LCX 2009;  c.  LHC 5/09: RCA proximal 90%, 95%, distally occluded, pCFX stents patent, OM1 30%, small left atrial branch 95%, oLAD occluded, S-dRCA patent, L-LAD/Dx patent, EF 60%=> Med Rx  ;  d.  Lex MV 3/13:  EF 63%, no ischemia  e.  Lex Myoview 03/08/13:  EF 56%, inf and apical defect suggestive of thinning and soft tissue attenuation, no ischemia; low risk.      History   Social History  . Marital Status: Widowed    Spouse Name: N/A  . Number of Children: 4  . Years of Education: N/A   Occupational History  . retired    Social History Main Topics  . Smoking status: Former Research scientist (life sciences)  . Smokeless tobacco:  Former Systems developer    Quit date: 02/11/1948  . Alcohol Use: No  . Drug Use: No  . Sexual Activity: Not on file   Other Topics Concern  . Not on file   Social History Narrative   Right handed, Widowed, 4 kids, Caffeine 2 cups daily, occa soda.  Retired, Chief Executive Officer    Past Surgical History  Procedure Laterality Date  . Coronary artery bypass graft  1998    x5, with a left internal mammary to the LAD and diagonal, vein graft to the OM1 and 2, and a vein graft to the right coronary artery  . Coronary stent placement  11/13/2007    LCX  . Trigger finger repaired  2009  . Laparoscopic gastrectomy N/A 08/18/2012    Procedure: Laparoscopic Partial Gastrectomy;  Surgeon: [name], MD;  Location: [facility] ORS;  Service: General;  Laterality: N/A;  Laparoscopic Partial Gastrectomy,    Family History  Problem Relation Age of Onset  . Stroke Father   . Pneumonia Father   . Colon cancer Neg Hx     Allergies  Allergen Reactions  . Codeine Phosphate Other (See Comments)    mouth sores  Current Outpatient Prescriptions on File Prior to Visit  Medication Sig Dispense Refill  . acetaminophen (TYLENOL) 500 MG tablet Take 1,000 mg by mouth every 6 (six) hours as needed for pain.    [name] amLODipine-valsartan (EXFORGE) 10-320 MG per tablet TAKE 1 TABLET BY MOUTH EVERY DAY 90 tablet 1  . aspirin EC 81 MG tablet Take 81 mg by mouth every morning.     [name] atorvastatin (LIPITOR) 10 MG tablet TAKE 1 TABLET BY MOUTH EVERY DAY 90 tablet 1  . calcium carbonate-magnesium hydroxide (ROLAIDS) 334 MG CHEW Chew 1 tablet by mouth 3 (three) times daily with meals.     . Calcium Citrate-Vitamin D (CITRACAL + D PO) Take 1 tablet by mouth daily.    . Denosumab (XGEVA [place]) Inject into the skin every 30 (thirty) days. Injects "around the first part of each month."    . donepezil (ARICEPT) 10 MG tablet TAKE 1 TABLET BY MOUTH ONCE DAILY 90 tablet 1  . enzalutamide (XTANDI) 40 MG capsule Take 4 capsules (160 mg total) by mouth  daily. 120 capsule 1  . Glucosamine-Chondroitin (GLUCOSAMINE CHONDR COMPLEX) 500-400 MG CAPS Take by mouth daily.    [name] glucose blood (ACCU-CHEK AVIVA) test strip USE TO CHECK BLOOD SUGAR DAILY AND PRN 100 each 12  . leuprolide (LUPRON) 3.75 MG injection Inject 3.75 mg into the muscle every 4 (four) months.    . levothyroxine (SYNTHROID, LEVOTHROID) 50 MCG tablet TAKE 1 TABLET BY MOUTH EVERY DAY 90 tablet 1  . LYRICA 50 MG capsule TAKE 2 CAPSULES BY MOUTH EVERY MORNING, THEN TAKE 1 CAPSULE BY MOUTH EVERY EVENING 90 capsule 5  . metFORMIN (GLUCOPHAGE) 1000 MG tablet TAKE 1 TABLET BY MOUTH TWICE DAILY WITH A MEAL 180 tablet 1  . NAMENDA XR 28 MG CP24 TAKE 1 CAPSULE BY MOUTH DAILY 30 capsule 5  . nitroGLYCERIN (NITROSTAT) 0.4 MG SL tablet Place 0.4 mg under the tongue every 5 (five) minutes as needed. For chest pain    . Nutritional Supplements (JUICE PLUS FIBRE PO) Take 6 capsules by mouth daily.     [name] omega-3 acid ethyl esters (LOVAZA) 1 G capsule Take 1 g by mouth once a week.     . pioglitazone (ACTOS) 15 MG tablet TAKE 1 TABLET BY MOUTH DAILY 90 tablet 1  . traMADol (ULTRAM) 50 MG tablet TAKE 1 TABLET BY MOUTH EVERY 8 HOURS AS NEEDED 90 tablet 2  . [DISCONTINUED] chlorthalidone (HYGROTON) 25 MG tablet Take 12.5 mg by mouth daily.     No current facility-administered medications on file prior to visit.    BP 144/70 mmHg  Pulse 52  Temp(Src) 97.5 F (36.4 C) (Oral)  Resp 18  Ht 5\' 7"  (1.702 m)  Wt 200 lb (90.719 kg)  BMI 31.32 kg/m2  SpO2 98%      Review of Systems  Constitutional: Positive for activity change, appetite change and fatigue. Negative for fever and chills.  HENT: Positive for congestion and sore throat. Negative for dental problem, ear pain, hearing loss, tinnitus, trouble swallowing and voice change.   Eyes: Negative for pain, discharge and visual disturbance.  Respiratory: Positive for cough and wheezing. Negative for chest tightness and stridor.   Cardiovascular:  Negative for chest pain, palpitations and leg swelling.  Gastrointestinal: Negative for nausea, vomiting, abdominal pain, diarrhea, constipation, blood in stool and abdominal distention.  Genitourinary: Negative for urgency, hematuria, flank pain, discharge, difficulty urinating and genital sores.  Musculoskeletal: Negative for myalgias, back pain, joint  swelling, arthralgias, gait problem and neck stiffness.  Skin: Negative for rash.  Neurological: Negative for dizziness, syncope, speech difficulty, weakness, numbness and headaches.  Hematological: Negative for adenopathy. Does not bruise/bleed easily.  Psychiatric/Behavioral: Negative for behavioral problems and dysphoric mood. The patient is not nervous/anxious.        Objective:   Physical Exam  Constitutional: He is oriented to person, place, and time. He appears well-developed.  HENT:  Head: Normocephalic.  Right Ear: External ear normal.  Left Ear: External ear normal.  Eyes: Conjunctivae and EOM are normal.  Neck: Normal range of motion.  Cardiovascular: Normal rate and normal heart sounds.   Slow and regular  Pulmonary/Chest: Effort normal.  A few very faint expiratory wheezes noted anteriorly  Abdominal: Bowel sounds are normal.  Musculoskeletal: Normal range of motion. He exhibits no edema or tenderness.  Neurological: He is alert and oriented to person, place, and time.  Psychiatric: He has a normal mood and affect. His behavior is normal.          Assessment & Plan:   Viral URI with cough.  Will treat symptomatically Hypertension Type 2 diabetes.  Will increase Amaryl to 4 mg daily.  When this acute illness has resolved.  Recheck 2 months

## 2014-09-21 NOTE — Patient Instructions (Signed)
Acute bronchitis symptoms  are generally not helped by antibiotics.  Take over-the-counter expectorants and cough medications such as  Mucinex DM.  Call if there is no improvement in 5 to 7 days or if  you develop worsening cough, fever, or new symptoms, such as shortness of breath or chest pain.  Acute bronchitis usually goes away in a couple weeks. Oftentimes, no medical treatment is necessary. Medicines are sometimes given for relief of fever or cough. Antibiotic medicines are usually not needed but may be prescribed in certain situations. In some cases, an inhaler may be recommended to help reduce shortness of breath and control the cough. A cool mist vaporizer may also be used to help thin bronchial secretions and make it easier to clear the chest.   HOME CARE INSTRUCTIONS  Get plenty of rest.  Drink enough fluids to keep your urine clear or pale yellow (unless you have a medical condition that requires fluid restriction). Increasing fluids may help thin your respiratory secretions (sputum) and reduce chest congestion, and it will prevent dehydration.  Take medicines only as directed by your health care provider.  Reduce the chances of another bout of acute bronchitis by washing your hands frequently, avoiding people with cold symptoms, and trying not to touch your hands to your mouth, nose, or eyes.  Keep all follow-up visits as directed by your health care provider.

## 2014-09-28 ENCOUNTER — Encounter (HOSPITAL_COMMUNITY)
Admission: RE | Admit: 2014-09-28 | Discharge: 2014-09-28 | Disposition: A | Discharge status: Home / Self Care | Payer: Medicare Other | Source: Ambulatory Visit | Attending: Oncology | Admitting: Oncology

## 2014-09-28 ENCOUNTER — Other Ambulatory Visit (HOSPITAL_BASED_OUTPATIENT_CLINIC_OR_DEPARTMENT_OTHER): Payer: Medicare Other

## 2014-09-28 DIAGNOSIS — C61 Malignant neoplasm of prostate: Secondary | ICD-10-CM | POA: Insufficient documentation

## 2014-09-28 LAB — COMPREHENSIVE METABOLIC PANEL (CC13)
ALT: 12 U/L (ref 0–55)
AST: 15 U/L (ref 5–34)
Albumin: 3.6 g/dL (ref 3.5–5.0)
Alkaline Phosphatase: 112 U/L (ref 40–150)
Anion Gap: 11 mEq/L (ref 3–11)
BILIRUBIN TOTAL: 0.4 mg/dL (ref 0.20–1.20)
BUN: 30.7 mg/dL — AB (ref 7.0–26.0)
CHLORIDE: 106 meq/L (ref 98–109)
CO2: 22 mEq/L (ref 22–29)
Calcium: 9.3 mg/dL (ref 8.4–10.4)
Creatinine: 1.7 mg/dL — ABNORMAL HIGH (ref 0.7–1.3)
EGFR: 37 mL/min/{1.73_m2} — ABNORMAL LOW (ref >90)
Glucose: 172 mg/dl — ABNORMAL HIGH (ref 70–140)
Potassium: 4.8 mEq/L (ref 3.5–5.1)
Sodium: 138 mEq/L (ref 136–145)
Total Protein: 6.8 g/dL (ref 6.4–8.3)

## 2014-09-28 LAB — CBC WITH DIFFERENTIAL/PLATELET
BASO%: 0.5 % (ref 0.0–2.0)
Basophils Absolute: 0 10*3/uL (ref 0.0–0.1)
EOS ABS: 0.2 10*3/uL (ref 0.0–0.5)
EOS%: 4.5 % (ref 0.0–7.0)
HCT: 36.1 % — ABNORMAL LOW (ref 38.4–49.9)
HGB: 11.8 g/dL — ABNORMAL LOW (ref 13.0–17.1)
LYMPH%: 19.4 % (ref 14.0–49.0)
MCH: 30.9 pg (ref 27.2–33.4)
MCHC: 32.8 g/dL (ref 32.0–36.0)
MCV: 94.2 fL (ref 79.3–98.0)
MONO#: 0.4 10*3/uL (ref 0.1–0.9)
MONO%: 9 % (ref 0.0–14.0)
NEUT%: 66.6 % (ref 39.0–75.0)
NEUTROS ABS: 3.1 10*3/uL (ref 1.5–6.5)
Platelets: 172 10*3/uL (ref 140–400)
RBC: 3.83 10*6/uL — ABNORMAL LOW (ref 4.20–5.82)
RDW: 14.5 % (ref 11.0–14.6)
WBC: 4.6 10*3/uL (ref 4.0–10.3)
lymph#: 0.9 10*3/uL (ref 0.9–3.3)

## 2014-09-28 MED ORDER — TECHNETIUM TC 99M MEDRONATE IV KIT
26.1000 | PACK | Freq: Once | INTRAVENOUS | Status: AC | PRN
  Administered 2014-09-28: 26.1 via INTRAVENOUS

## 2014-09-29 LAB — PSA: PSA: 16.06 ng/mL — AB (ref <=4.00)

## 2014-09-30 ENCOUNTER — Ambulatory Visit (HOSPITAL_BASED_OUTPATIENT_CLINIC_OR_DEPARTMENT_OTHER): Payer: Medicare Other | Admitting: Oncology

## 2014-09-30 ENCOUNTER — Telehealth: Payer: Self-pay | Admitting: Oncology

## 2014-09-30 VITALS — BP 142/68 | HR 60 | Temp 98.0°F | Resp 18 | Ht 67.0 in | Wt 195.8 lb

## 2014-09-30 DIAGNOSIS — E291 Testicular hypofunction: Secondary | ICD-10-CM

## 2014-09-30 DIAGNOSIS — C61 Malignant neoplasm of prostate: Secondary | ICD-10-CM

## 2014-09-30 DIAGNOSIS — C7951 Secondary malignant neoplasm of bone: Secondary | ICD-10-CM | POA: Diagnosis not present

## 2014-09-30 NOTE — Telephone Encounter (Signed)
Pt confirmed labs/ov per 04/01 POF, gave pt AVS and Calendar... KJ  °

## 2014-09-30 NOTE — Progress Notes (Signed)
Hematology and Oncology Follow Up Visit  [name] [name] [id number] July 06, 1929 81 y.o. 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 His initial diagnosis was in 2006 when he presented with a PSA of 6.26 the Gleason score 4+3 equals 7.  Prior Therapy:  He is status post definitive therapy with radiation and androgen deprivation completed in 2007. The patient developed recurrent disease in 2012 with a PSA rise up to 28 and T9 osseous metastasis and was treated with combined androgen deprivation with Lupron and Casodex. He had an excellent response with his PSA nadir down to 0.19. Most recently he developed a rise in his PSA up to 3.06 in July 2014 despite castrate levels of testosterone. His bone scan in July 2014 confirmed the presence of bony disease. Zytiga 1000 mg daily started in July 2014 till July 2015.   Current therapy:  Xtandi 160 mg daily started in July of 2015. He receives Niger monthly at [facility] urology. He receives Lupron every 4 months at [facility] urology.  Interim History:  [name] [name] returns today for a followup visit accompanied by his daughter and son. Since the last visit, he reports  similar complaints.   He continues to have diffuse bony pain predominantly shoulder, hips and knees. He reports he is eating well and have actually gained more weight. He is tolerating his Xtandi without difficulty. He does not report any lower extremity edema or difficulty breathing. No abdominal pain, nausea, vomiting. His performance status is unchanged. Has not reported any of the fluid retention or any bone pain.  He does not report any headaches or blurry vision. He does not report any syncope or falls. He does not report chest pain, shortness of breath or difficulty breathing. Did not report any genitourinary bleeding or hemoptysis.  Remainder of his review of systems unremarkable.  Medications: I have reviewed the patient's current medications.  Current Outpatient Prescriptions  Medication Sig Dispense Refill  . acetaminophen (TYLENOL) 500 MG tablet Take 1,000 mg by mouth every 6 (six) hours as needed for pain.    [name] amLODipine-valsartan (EXFORGE) 10-320 MG per tablet TAKE 1 TABLET BY MOUTH EVERY DAY 90 tablet 1  . aspirin EC 81 MG tablet Take 81 mg by mouth every morning.     [name] atorvastatin (LIPITOR) 10 MG tablet TAKE 1 TABLET BY MOUTH EVERY DAY 90 tablet 1  . calcium carbonate-magnesium hydroxide (ROLAIDS) 334 MG CHEW Chew 1 tablet by mouth 3 (three) times daily with meals.     . Calcium Citrate-Vitamin D (CITRACAL + D PO) Take 1 tablet by mouth daily.    . Denosumab (XGEVA [place]) Inject into the skin every 30 (thirty) days. Injects "around the first part of each month."    . diclofenac sodium (VOLTAREN) 1 % GEL Apply 4 g topically 4 (four) times daily. 1 Tube 3  . donepezil (ARICEPT) 10 MG tablet TAKE 1 TABLET BY MOUTH ONCE DAILY 90 tablet 1  . enzalutamide (XTANDI) 40 MG capsule Take 4 capsules (160 mg total) by mouth daily. 120 capsule 1  . glimepiride (AMARYL) 4 MG tablet Take 4 mg by mouth daily with breakfast.   4  . Glucosamine-Chondroitin (GLUCOSAMINE CHONDR COMPLEX) 500-400 MG CAPS Take by mouth daily.    [name] glucose blood (ACCU-CHEK AVIVA) test strip USE TO CHECK BLOOD SUGAR DAILY AND PRN 100 each 12  . leuprolide (LUPRON) 3.75  MG injection Inject 3.75 mg into the muscle every 4 (four) months.    . levothyroxine (SYNTHROID, LEVOTHROID) 50 MCG tablet TAKE 1 TABLET BY MOUTH EVERY DAY 90 tablet 1  . LYRICA 50 MG capsule TAKE 2 CAPSULES BY MOUTH EVERY MORNING, THEN TAKE 1 CAPSULE BY MOUTH EVERY EVENING 90 capsule 5  . metFORMIN (GLUCOPHAGE) 1000 MG tablet TAKE 1 TABLET BY MOUTH TWICE DAILY WITH A MEAL 180 tablet 1  . NAMENDA XR 28 MG CP24 TAKE 1 CAPSULE BY MOUTH DAILY 30 capsule 5  . nitroGLYCERIN (NITROSTAT) 0.4 MG SL  tablet Place 0.4 mg under the tongue every 5 (five) minutes as needed. For chest pain    . Nutritional Supplements (JUICE PLUS FIBRE PO) Take 6 capsules by mouth daily.     [name] omega-3 acid ethyl esters (LOVAZA) 1 G capsule Take 1 g by mouth once a week.     . pioglitazone (ACTOS) 15 MG tablet TAKE 1 TABLET BY MOUTH DAILY 90 tablet 1  . traMADol (ULTRAM) 50 MG tablet TAKE 1 TABLET BY MOUTH EVERY 8 HOURS AS NEEDED 90 tablet 2  . VESICARE 10 MG tablet Take 5 mg by mouth daily.   11  . [DISCONTINUED] chlorthalidone (HYGROTON) 25 MG tablet Take 12.5 mg by mouth daily.     No current facility-administered medications for this visit.     Allergies:  Allergies  Allergen Reactions  . Codeine Phosphate Other (See Comments)    mouth sores    Past Medical History, Surgical history, Social history, and Family History were reviewed and updated.    Physical Exam: Blood pressure 142/68, pulse 60, temperature 98 F (36.7 C), temperature source Oral, resp. rate 18, height 5\' 7"  (1.702 m), weight 195 lb 12.8 oz (88.814 kg), SpO2 98 %. ECOG: 1 General appearance: alert and cooperative not in any distress. Head: Normocephalic, without obvious abnormality Neck: no adenopathy Lymph nodes: Cervical, supraclavicular, and axillary nodes normal. Heart:regular rate and rhythm, S1, S2 normal, no murmur, click, rub or gallop Lung:chest clear, no wheezing, rales, or dose to percussion Abdomen: soft, non-tender, without masses or organomegaly EXT:no erythema, induration, or nodules  neurological examination: No deficits.  Lab Results: Lab Results  Component Value Date   WBC 4.6 09/28/2014   HGB 11.8* 09/28/2014   HCT 36.1* 09/28/2014   MCV 94.2 09/28/2014   PLT 172 09/28/2014     Chemistry      Component Value Date/Time   NA 138 09/28/2014 0814   NA 137 02/22/2014 1026   K 4.8 09/28/2014 0814   K 4.3 02/22/2014 1026   CL 101 02/22/2014 1026   CO2 22 09/28/2014 0814   CO2 26 02/22/2014 1026    BUN 30.7* 09/28/2014 0814   BUN 27* 02/22/2014 1026   CREATININE 1.7* 09/28/2014 0814   CREATININE 1.5 02/22/2014 1026   CREATININE 1.38* 02/11/2012 1614      Component Value Date/Time   CALCIUM 9.3 09/28/2014 0814   CALCIUM 10.1 02/22/2014 1026   ALKPHOS 112 09/28/2014 0814   ALKPHOS 54 05/07/2013 1522   AST 15 09/28/2014 0814   AST 22 05/07/2013 1522   ALT 12 09/28/2014 0814   ALT 20 05/07/2013 1522   BILITOT 0.40 09/28/2014 0814   BILITOT 0.7 05/07/2013 1522     Lab Results  Component Value Date   PSA 16.06* 09/28/2014   PSA 12.27* 08/26/2014   PSA 10.31* 07/29/2014     Impression and Plan: This is an 81 year old gentleman with the  following issues: 1. 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 His initial diagnosis dates back to 2006 with PSA of 6.26 and a Gleason score of 7. He progressed on [place] and currently on Xtandi.   His PSA has been slowly on the rise now up to 16 since the last visit.  His bone scan was reviewed today and  images studies were discussed with the patient and his family. He has clear progression of disease with increase in his bony metastasis on the bone scan that was done on 09/28/2014.   Options of treatments were discussed today including systemic chemotherapy in the form of Taxotere versus Xofigo. Risks and benefits of both those treatments were discussed. I feel that given his bony disease as well as symptomatic pain associated with it he would benefit from [place]. I will refer him to radiation oncology for an evaluation. I've asked him to stop xtandi as well.  2. Bony disease: He is receiving Xgeva at [facility] urology on a monthly basis.  3. Hormonal deprivation: He is receiving Lupron at [facility] urology every 4 months.  4. Hypokalemia: Resolved. His potassium is back to normal.   5. Weight loss: He is actually gaining weight at this time.  6. Followup: He will return in 4-5 weeks.   Zola Button, MD 4/1/20163:51  PM

## 2014-09-30 NOTE — Addendum Note (Signed)
Addended by: Wyatt Portela on: 09/30/2014 04:23 PM   Modules accepted: Orders

## 2014-10-03 ENCOUNTER — Encounter: Payer: Self-pay | Admitting: Oncology

## 2014-10-05 ENCOUNTER — Ambulatory Visit (INDEPENDENT_AMBULATORY_CARE_PROVIDER_SITE_OTHER): Payer: Medicare Other | Admitting: Internal Medicine

## 2014-10-05 ENCOUNTER — Encounter: Payer: Self-pay | Admitting: Internal Medicine

## 2014-10-05 VITALS — BP 110/70 | HR 77 | Temp 97.5°F | Resp 20 | Ht 67.0 in | Wt 197.0 lb

## 2014-10-05 DIAGNOSIS — E039 Hypothyroidism, unspecified: Secondary | ICD-10-CM

## 2014-10-05 DIAGNOSIS — I10 Essential (primary) hypertension: Secondary | ICD-10-CM

## 2014-10-05 DIAGNOSIS — E0842 Diabetes mellitus due to underlying condition with diabetic polyneuropathy: Secondary | ICD-10-CM | POA: Diagnosis not present

## 2014-10-05 DIAGNOSIS — C61 Malignant neoplasm of prostate: Secondary | ICD-10-CM

## 2014-10-05 LAB — HEMOGLOBIN A1C: Hgb A1c MFr Bld: 6.9 % — ABNORMAL HIGH (ref 4.6–6.5)

## 2014-10-05 MED ORDER — AMLODIPINE BESYLATE-VALSARTAN 10-320 MG PO TABS: ORAL_TABLET | ORAL | Status: DC

## 2014-10-05 MED ORDER — METFORMIN HCL 1000 MG PO TABS: 1000.0000 mg | ORAL_TABLET | Freq: Every day | ORAL | Status: DC

## 2014-10-05 NOTE — Progress Notes (Signed)
Pre visit review using our clinic review tool, if applicable. No additional management support is needed unless otherwise documented below in the visit note. 

## 2014-10-05 NOTE — Progress Notes (Signed)
Subjective:    Patient ID: [name] [name], male    DOB: 07-Dec-1929, 81 y.o.   MRN: [id number]  HPI 81 year old patient who is seen today in follow-up.  He was seen recently for a URI and these symptoms have largely resolved.  He still has some minor residual cough He has been seen by oncology.  Recently.  Bone scan reveals progressive metastatic disease and his chemotherapeutic regimen has been changed.  He is also to be evaluated by radiation oncology Laboratory studies reviewed.  Crowding has increased over the past month from 1.4 to 1.7 and the creatinine clearance declined from 45-37.  Remains on full dose metformin therapy He states blood sugars have averaged 170 over the past week.  Hemoglobin A1c has trended up and was 8.1  3 months ago  Past Medical History  Diagnosis Date  . Hypertension   . Hyperlipidemia   . Memory loss     mild  . Rectal ulcer     RESOLVED  . Constipation   . History of ventricular fibrillation 1998    while on treadmill  . Hypothyroidism   . IBS (irritable bowel syndrome)   . Colitis     RESOLVED  . ED (erectile dysfunction)   . Osteoporosis   . Depression   . Diabetes mellitus     fasting blood sugar 140-200 usually  . GERD (gastroesophageal reflux disease)   . Heart attack 2009  . Dementia   . Neuromuscular disorder     DIABETIC NEUROPATHY  . Prostate cancer   . Pain     LOWER BACK  -- PAST HX OF STEROID EPIDURALS FOR PAIN RELIEF  . 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 CAD (coronary artery disease)     a. S/P CABG x 5 1998; b.  S/P stent LCX 2009;  c.  LHC 5/09: RCA proximal 90%, 95%, distally occluded, pCFX stents patent, OM1 30%, small left atrial branch 95%, oLAD occluded, S-dRCA patent, L-LAD/Dx patent, EF 60%=> Med Rx  ;  d.  Lex MV 3/13:  EF 63%, no ischemia  e.  Lex Myoview 03/08/13:  EF 56%, inf  and apical defect suggestive of thinning and soft tissue attenuation, no ischemia; low risk.      History   Social History  . Marital Status: Widowed    Spouse Name: N/A  . Number of Children: 4  . Years of Education: N/A   Occupational History  . retired    Social History Main Topics  . Smoking status: Former Research scientist (life sciences)  . Smokeless tobacco: Former Systems developer    Quit date: 02/11/1948  . Alcohol Use: No  . Drug Use: No  . Sexual Activity: Not on file   Other Topics Concern  . Not on file   Social History Narrative   Right handed, Widowed, 4 kids, Caffeine 2 cups daily, occa soda.  Retired, Chief Executive Officer    Past Surgical History  Procedure Laterality Date  . Coronary artery bypass graft  1998    x5, with a left internal mammary to the LAD and diagonal, vein graft to the OM1 and 2, and a vein graft to the right coronary artery  . Coronary stent placement  11/13/2007    LCX  . Trigger finger repaired  2009  . Laparoscopic gastrectomy N/A 08/18/2012    Procedure: Laparoscopic Partial Gastrectomy;  Surgeon: [name],  MD;  Location: [facility];  Service: General;  Laterality: N/A;  Laparoscopic Partial Gastrectomy,    Family History  Problem Relation Age of Onset  . Stroke Father   . Pneumonia Father   . Colon cancer Neg Hx     Allergies  Allergen Reactions  . Codeine Phosphate Other (See Comments)    mouth sores    Current Outpatient Prescriptions on File Prior to Visit  Medication Sig Dispense Refill  . acetaminophen (TYLENOL) 500 MG tablet Take 1,000 mg by mouth every 6 (six) hours as needed for pain.    [name] amLODipine-valsartan (EXFORGE) 10-320 MG per tablet TAKE 1 TABLET BY MOUTH EVERY DAY 90 tablet 1  . aspirin EC 81 MG tablet Take 81 mg by mouth every morning.     [name] atorvastatin (LIPITOR) 10 MG tablet TAKE 1 TABLET BY MOUTH EVERY DAY 90 tablet 1  . calcium carbonate-magnesium hydroxide (ROLAIDS) 334 MG CHEW Chew 1 tablet by mouth 3 (three) times daily with meals.     .  Calcium Citrate-Vitamin D (CITRACAL + D PO) Take 1 tablet by mouth daily.    . Denosumab (XGEVA [place]) Inject into the skin every 30 (thirty) days. Injects "around the first part of each month."    . donepezil (ARICEPT) 10 MG tablet TAKE 1 TABLET BY MOUTH ONCE DAILY 90 tablet 1  . glimepiride (AMARYL) 4 MG tablet Take 4 mg by mouth daily with breakfast.   4  . Glucosamine-Chondroitin (GLUCOSAMINE CHONDR COMPLEX) 500-400 MG CAPS Take by mouth daily.    [name] glucose blood (ACCU-CHEK AVIVA) test strip USE TO CHECK BLOOD SUGAR DAILY AND PRN 100 each 12  . leuprolide (LUPRON) 3.75 MG injection Inject 3.75 mg into the muscle every 4 (four) months.    . levothyroxine (SYNTHROID, LEVOTHROID) 50 MCG tablet TAKE 1 TABLET BY MOUTH EVERY DAY 90 tablet 1  . LYRICA 50 MG capsule TAKE 2 CAPSULES BY MOUTH EVERY MORNING, THEN TAKE 1 CAPSULE BY MOUTH EVERY EVENING 90 capsule 5  . metFORMIN (GLUCOPHAGE) 1000 MG tablet TAKE 1 TABLET BY MOUTH TWICE DAILY WITH A MEAL 180 tablet 1  . NAMENDA XR 28 MG CP24 TAKE 1 CAPSULE BY MOUTH DAILY 30 capsule 5  . nitroGLYCERIN (NITROSTAT) 0.4 MG SL tablet Place 0.4 mg under the tongue every 5 (five) minutes as needed. For chest pain    . Nutritional Supplements (JUICE PLUS FIBRE PO) Take 6 capsules by mouth daily.     [name] omega-3 acid ethyl esters (LOVAZA) 1 G capsule Take 1 g by mouth once a week.     . pioglitazone (ACTOS) 15 MG tablet TAKE 1 TABLET BY MOUTH DAILY 90 tablet 1  . traMADol (ULTRAM) 50 MG tablet TAKE 1 TABLET BY MOUTH EVERY 8 HOURS AS NEEDED 90 tablet 2  . VESICARE 10 MG tablet Take 5 mg by mouth daily.   11  . [DISCONTINUED] chlorthalidone (HYGROTON) 25 MG tablet Take 12.5 mg by mouth daily.     No current facility-administered medications on file prior to visit.    BP 110/70 mmHg  Pulse 77  Temp(Src) 97.5 F (36.4 C) (Oral)  Resp 20  Ht 5\' 7"  (1.702 m)  Wt 197 lb (89.359 kg)  BMI 30.85 kg/m2  SpO2 97%    Review of Systems  Constitutional: Positive for  activity change and fatigue. Negative for fever, chills and appetite change.  HENT: Negative for congestion, dental problem, ear pain, hearing loss, sore throat, tinnitus, trouble swallowing and  voice change.   Eyes: Negative for pain, discharge and visual disturbance.  Respiratory: Positive for cough. Negative for chest tightness, wheezing and stridor.   Cardiovascular: Negative for chest pain, palpitations and leg swelling.  Gastrointestinal: Negative for nausea, vomiting, abdominal pain, diarrhea, constipation, blood in stool and abdominal distention.  Genitourinary: Negative for urgency, hematuria, flank pain, discharge, difficulty urinating and genital sores.  Musculoskeletal: Negative for myalgias, back pain, joint swelling, arthralgias, gait problem and neck stiffness.  Skin: Negative for rash.  Neurological: Positive for weakness. Negative for dizziness, syncope, speech difficulty, numbness and headaches.  Hematological: Negative for adenopathy. Does not bruise/bleed easily.  Psychiatric/Behavioral: Positive for decreased concentration. Negative for behavioral problems and dysphoric mood. The patient is not nervous/anxious.        Objective:   Physical Exam  Constitutional: He is oriented to person, place, and time. He appears well-developed.  Has become more frail.  Requires assistance to transfer from a sitting position to the examining table Blood pressure on arrival 110 over 70 Repeat blood pressure 100/60  HENT:  Head: Normocephalic.  Right Ear: External ear normal.  Left Ear: External ear normal.  Eyes: Conjunctivae and EOM are normal.  Neck: Normal range of motion.  Cardiovascular: Normal rate and normal heart sounds.   Pulmonary/Chest: Breath sounds normal. No respiratory distress. He has no wheezes.  Abdominal: Bowel sounds are normal.  Musculoskeletal: Normal range of motion. He exhibits no edema or tenderness.  Neurological: He is alert and oriented to person, place,  and time.  Psychiatric: He has a normal mood and affect. His behavior is normal.          Assessment & Plan:   Diabetes mellitus.  Will decrease metformin to 1 g daily.  Due to renal insufficiency.  Will recheck indices in 3 months may need to discontinue metformin therapy at that time.  If creatinine clearance is less than 45.  Check hemoglobin A1c today.  Likely will require insulin therapy in the near future Hypertension.  Blood pressure a bit low today.  We'll decrease Exforge to 1 half tablet daily.  Recheck 3 months

## 2014-10-05 NOTE — Patient Instructions (Signed)
Limit your sodium (Salt) intake   Please check your hemoglobin A1c every 3 months  Decrease Exforge to 1 half tablet daily  Decrease metformin to 1 tablet daily

## 2014-10-06 ENCOUNTER — Ambulatory Visit: Payer: Medicare Other | Admitting: Internal Medicine

## 2014-10-10 ENCOUNTER — Other Ambulatory Visit: Payer: Self-pay | Admitting: Internal Medicine

## 2014-10-17 ENCOUNTER — Encounter: Payer: Self-pay | Admitting: Neurology

## 2014-10-17 ENCOUNTER — Encounter: Payer: Self-pay | Admitting: Radiation Oncology

## 2014-10-17 ENCOUNTER — Ambulatory Visit (INDEPENDENT_AMBULATORY_CARE_PROVIDER_SITE_OTHER): Payer: Medicare Other | Admitting: Neurology

## 2014-10-17 VITALS — BP 148/77 | HR 48 | Resp 16 | Ht 67.0 in | Wt 200.0 lb

## 2014-10-17 DIAGNOSIS — M25562 Pain in left knee: Secondary | ICD-10-CM | POA: Diagnosis not present

## 2014-10-17 DIAGNOSIS — C61 Malignant neoplasm of prostate: Secondary | ICD-10-CM | POA: Diagnosis not present

## 2014-10-17 DIAGNOSIS — R413 Other amnesia: Secondary | ICD-10-CM | POA: Diagnosis not present

## 2014-10-17 NOTE — Progress Notes (Signed)
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 The patient developed recurrent disease in 2012 with a PSA rise up to 28 and T9 osseous metastasis and was treated with combined androgen deprivation with Lupron and Casodex. He had an excellent response with his PSA nadir down to 0.19. Most recently he developed a rise in his PSA up to 3.06 in July 2014 despite castrate levels of testosterone. His bone scan in July 2014 confirmed the presence of bony disease. Zytiga 1000 mg daily started in July 2014 till July 2015.   Location(s) of Symptomatic Metastases: patient report of diffuse bony pain predominantly shoulder, hips and knees.  Bone Scan 09/28/14 - There is increased metastatic involvement of the thoracic spine, pelvis, lesser trochanter of the right femur, and ribs since the previous study.  He receives Niger monthly at [facility] urology. He receives Lupron every 4 months at [facility] urology.  Past/Anticipated chemotherapy by medical oncology, if any: Dr. Zola Button- Xtandi 160 mg daily started in July of 2015 and stopped 09/30/14  Pain on a scale of 0-10 is: None presently   If Spine Met(s), symptoms, if any, include:  Bowel/Bladder retention or incontinence (please describe): None.  Takes Vesicare  Numbness or weakness in extremities (please describe): Neuropathy in - diabetic  Current Decadron regimen, if applicable: No  Ambulatory status? Walker? Wheelchair?: Ambulates with a Cane  SAFETY ISSUES:  Prior radiation? Prostate 7800 cGy, 40 Sessions - 07/17/2005 - 09/10/2005  Pacemaker/ICD? No  Possible current pregnancy? N/A  Is the patient on methotrexate? No  Current Complaints / other details:

## 2014-10-17 NOTE — Patient Instructions (Addendum)
We will keep your medications the same for your memory.   Please walk with a walker when you walk outside and have your aid walk with you for additional safety.   Drink more water! About 6 glasses of water a day, if you can.   Please have your son or daughter observe your driving for safety assessment.   Follow up in 6 months.

## 2014-10-17 NOTE — Progress Notes (Signed)
Subjective:    [name] ID: [name] [name] [name] is a 81 y.o. male.  HPI     Interim history:   [name] [name] [name] is an 81 year old right-handed gentleman, with an underlying complex medical history of hypertension, hyperlipidemia, heart disease, status post 3 vessel CABG in 1998, stent placement in 2009, left heart catheter in 2009, history of V. fib, hypothyroidism, IBS, ED, osteoporosis, depression, diabetes with neuropathy, reflux disease, and prostate cancer (radiation treatment in 2005 with recurrence and evidence of bony metastasis), who presents for followup consultation of his memory loss and neuropathy. He is accompanied by his daughter today.   I last saw [name] [name] [name] on 04/15/2014, at which time his MMSE was 25, and he was on long-acting Namenda 28 mg and Aricept 10 mg and I suggested we continue with [name] [name] current treatment. He had been hospitalized in September while [place] was [name] and he had presented with weakness and confusion. He was found to have low sodium levels which was attributed to medication.   Today, 10/17/2014: He is able to provide his own history for [name] [name] most part but his daughter provides details. He continues to live alone. He does some local driving within 5-7 mild radius, no nighttime driving, no Interstate. He has an aid to come in 3 days a week on Mondays, Wednesdays and Fridays and she states for about 3 hours. He has been walking with a cane. She also has a walker available. Thankfully he has not fallen recently. His prostate cancer has recurred and his disease is progressive. He met with his cancer doctor earlier this month and I reviewed [name] [name] office note from Dr. Alen Blew. [name] [name] [name] had an increased gradually of his PSA to up to 16. He is in [name] [name] process of being evaluated for a new treatment option through radiation oncology. He has a consultation appointment tomorrow to discuss further details. They opted not to do chemotherapy at this time. He may not drink enough water, averaging  maybe 3 glasses of water per day. He has been tolerating his memory medications and she has noted a slow decline in his memory function.    Previously:   I saw [name] [name] [name] on 10/14/2013, at which time he reported feeling stable for [name] [name] most part. He had not fallen recently. He had trouble recalling names and dates. He was on [place] and Lupron for his prostate cancer at [name] [name] time and he had a bone scan pending.    He lives alone, his wife died almost 3 years ago. He has 4 children, 3 are local and he gets to see them weekly. He has been driving, but not as good, local places, some after dark, stays in [facility].  I first met [name] [name] [name] on 04/15/2013, at which time I felt that his exam was in keeping with mild cognitive impairment or mild vascular dementia without behavioral disturbance and I felt that he was clinically stable. His MMSE scores, physical exam, clock drawing and animal fluency scores were stable and I suggested a routine six-month followup and no changes in his medications.   He previously followed with Dr. Morene Antu and was last seen by [name] [name] [name] on 09/10/2012, at which time Dr. Erling Cruz felt he was stable and he did not make any changes to his medication regimen. He has a complex underlying medical history as above. He has received Lupron therapy for his prostate cancer, as well as radiation therapy and his gastric tumor was diagnosed by biopsy in August 2012, s/p surgery in  3/14. He is on medications for his cancer. He broke his R arm at age 23 and has loss of ROM d/t that.   He has had MCI, and was initially evaluated on 02/24/1997 at [name] [name] request of Dr. Romeo Apple for memory loss, which began after surgery with coronary artery bypass on 12/02/1996. Evaluation included B12 level, RPR, neuropsychological battery, and MRI study study of [name] [name] brain 02/26/1997. Doppler study of [name] [name] carotids showed 50-69% right ECA stenosis. He has short-term memory loss which has been stable. He has been on Aricept for  over 15 years. He worked as a Chief Executive Officer. He drives a car and is independent in ADLs. He has had daytime sleepiness. In January 2012 he developed severe rectal pain which progressed to go into his left leg posteriorly to behind [name] [name] knee and into [name] [name] left foot. It can go into his scrotum and into his penis. MRI of [name] [name] lumbar spine without contrast on 04/09/10, 09/07/10, and 01/07/11 showed mild DJD. MRI of [name] [name] pelvis without contrast on 05/07/10 showed rectal wall thickening and no pelvic adenopathy. There was mild diffuse bladder wall thickness. 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 Bone scan 02/07/2011 showed possible metastatic process at T9. He has had bowel and bladder accidents. On 03/08/11, his MMSE was 29/30. He has left foot numbness on [name] [name] bottom of his foot. His pain is 0-8/10. He has discontinued a fentanyl patch and Nucynta. In 08/2011 he developed pain in his right side including [name] [name] buttocks extending to [name] [name] bottom of his right foot with numbness. He had an epidural injection at L5-S1 for his severe pain with improvement in symptoms. His pain is made worse by bending and stooping. MRI of [name] [name] lumbar spine on 10/05/2011 showed a focal disc protrusion at L5-S1 to [name] [name] right of midline and small sclerotic metastasis to [name] [name] [name] [name] posterior aspect of [name] [name] left iliac bone.    His Past Medical History Is Significant For: Past Medical History  Diagnosis Date  . Hypertension   . Hyperlipidemia   . Memory loss     mild  . Rectal ulcer     RESOLVED  . Constipation   . History of ventricular fibrillation 1998    while on treadmill  . Hypothyroidism   . IBS (irritable bowel syndrome)   . Colitis     RESOLVED  . ED (erectile dysfunction)   . Osteoporosis   . Depression   . Diabetes mellitus     fasting blood sugar 140-200 usually  . GERD (gastroesophageal reflux disease)   . Heart attack 2009  .  Dementia   . Neuromuscular disorder     DIABETIC NEUROPATHY  . Prostate cancer   . Pain     LOWER BACK  -- PAST HX OF STEROID EPIDURALS FOR PAIN RELIEF  . 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 CAD (coronary artery disease)     a. S/P CABG x 5 1998; b.  S/P stent LCX 2009;  c.  LHC 5/09: RCA proximal 90%, 95%, distally occluded, pCFX stents patent, OM1 30%, small left atrial branch 95%, oLAD occluded, S-dRCA patent, L-LAD/Dx patent, EF 60%=> Med Rx  ;  d.  Lex MV 3/13:  EF 63%, no ischemia  e.  Lex Myoview 03/08/13:  EF 56%, inf and apical defect suggestive of thinning and soft tissue attenuation, no ischemia; low risk.      His Past Surgical History Is Significant For: Past Surgical History  Procedure Laterality Date  . Coronary artery bypass graft  1998    x5, with a left internal mammary to [name] [name] LAD and diagonal, vein graft to [name] [name] OM1 and 2, and a vein graft to [name] [name] right coronary artery  . Coronary stent placement  11/13/2007    LCX  . Trigger finger repaired  2009  . Laparoscopic gastrectomy N/A 08/18/2012    Procedure: Laparoscopic Partial Gastrectomy;  Surgeon: [name], MD;  Location: [facility];  Service: General;  Laterality: N/A;  Laparoscopic Partial Gastrectomy,    His Family History Is Significant For: Family History  Problem Relation Age of Onset  . Stroke Father   . Pneumonia Father   . Colon cancer Neg Hx     His Social History Is Significant For: History   Social History  . Marital Status: Widowed    Spouse Name: N/A  . Number of Children: 4  . Years of Education: N/A   Occupational History  . retired    Social History Main Topics  . Smoking status: Former Research scientist (life sciences)  . Smokeless tobacco: Former Systems developer    Quit date: 02/11/1948  . Alcohol Use: No  . Drug Use: No  . Sexual Activity: Not on file   Other Topics Concern   . None   Social History Narrative   Right handed, Widowed, 4 kids, Caffeine 2 cups daily, occa soda.  Retired, Chief Executive Officer    His Allergies Are:  Allergies  Allergen Reactions  . Codeine Phosphate Other (See Comments)    mouth sores  :   His Current Medications Are:  Outpatient Encounter Prescriptions as of 10/17/2014  Medication Sig  . acetaminophen (TYLENOL) 500 MG tablet Take 1,000 mg by mouth every 6 (six) hours as needed for pain.  [name] amLODipine-valsartan (EXFORGE) 10-320 MG per tablet TAKE 1/2 TABLET BY MOUTH EVERY DAY  . aspirin EC 81 MG tablet Take 81 mg by mouth every morning.   [name] atorvastatin (LIPITOR) 10 MG tablet TAKE 1 TABLET BY MOUTH EVERY DAY  . calcium carbonate-magnesium hydroxide (ROLAIDS) 334 MG CHEW Chew 1 tablet by mouth 3 (three) times daily with meals.   . Calcium Citrate-Vitamin D (CITRACAL + D PO) Take 1 tablet by mouth daily.  . Denosumab (XGEVA [place]) Inject into [name] [name] skin every 30 (thirty) days. Injects "around [name] [name] first part of each month."  . donepezil (ARICEPT) 10 MG tablet TAKE 1 TABLET BY MOUTH ONCE DAILY  . glimepiride (AMARYL) 4 MG tablet Take 4 mg by mouth daily with breakfast.   . Glucosamine-Chondroitin (GLUCOSAMINE CHONDR COMPLEX) 500-400 MG CAPS Take by mouth daily.  [name] glucose blood (ACCU-CHEK AVIVA) test strip USE TO CHECK BLOOD SUGAR DAILY AND PRN  . leuprolide (LUPRON) 3.75 MG injection Inject 3.75 mg into [name] [name] muscle every 4 (four) months.  . levothyroxine (SYNTHROID, LEVOTHROID) 50 MCG tablet TAKE 1 TABLET BY MOUTH EVERY DAY  . LYRICA 50 MG capsule TAKE 2 CAPSULES BY MOUTH EVERY MORNING, THEN TAKE 1 CAPSULE BY MOUTH EVERY EVENING  . metFORMIN (GLUCOPHAGE) 1000 MG tablet Take 1 tablet (1,000 mg total) by mouth daily with breakfast.  . NAMENDA XR 28 MG CP24 24 hr capsule TAKE ONE CAPSULE BY MOUTH DAILY  . nitroGLYCERIN (NITROSTAT) 0.4 MG SL tablet Place 0.4 mg under [name] [name] tongue every 5 (five)  minutes as needed. For chest pain  . Nutritional Supplements  (JUICE PLUS FIBRE PO) Take 6 capsules by mouth daily.   [name] omega-3 acid ethyl esters (LOVAZA) 1 G capsule Take 1 g by mouth once a week.   . pioglitazone (ACTOS) 15 MG tablet TAKE 1 TABLET BY MOUTH DAILY  . traMADol (ULTRAM) 50 MG tablet TAKE 1 TABLET BY MOUTH EVERY 8 HOURS AS NEEDED  . VESICARE 10 MG tablet Take 5 mg by mouth daily.   . [DISCONTINUED] glimepiride (AMARYL) 4 MG tablet TAKE 1 TABLET BY MOUTH DAILY BEFORE BREAKFAST  :  Review of Systems:  Out of a complete 14 point review of systems, all are reviewed and negative with [name] [name] exception of these symptoms as listed below:   Review of Systems  Neurological:       Memory loss    Objective:  Neurologic Exam  Physical Exam Physical Examination:   Filed Vitals:   10/17/14 1605  BP: 148/77  Pulse: 48  Resp: 16   General Examination: [name] [name] [name] is a very pleasant 81 y.o. male in no acute distress. He is calm and cooperative with [name] [name] exam. He denies Auditory Hallucinations and Visual Hallucinations. He is very well groomed and situated in a chair.   HEENT: Normocephalic, atraumatic, pupils are equal, round and reactive to light and accommodation. He has cataracts, and R ptosis (this since birth, unchanged from before). Extraocular tracking shows mild saccadic breakdown without nystagmus noted. Hearing is impaired. Face is symmetric with no facial masking and normal facial sensation. There is no lip, neck or jaw tremor. Neck is not rigid with intact passive ROM. There are no carotid bruits on auscultation. Oropharynx exam reveals mild mouth dryness. No significant airway crowding is noted. Mallampati is class II. Tongue protrudes centrally and palate elevates symmetrically.    Chest: is clear to auscultation without wheezing, rhonchi or crackles noted.  Heart: sounds are regular and normal without murmurs, rubs or gallops noted.   Abdomen: is soft, non-tender and non-distended with normal bowel sounds appreciated on  auscultation.  Extremities: There is trace edema in [name] [name] ankles bilaterally. Pedal pulses are intact.   Skin: is warm and dry with no trophic changes noted. Age-related changes are noted on [name] [name] skin. Multiple small bruises of varying ages are noted on [name] [name] forearms. Of note, He is on ASA.  Musculoskeletal: exam reveals R elbow joint deformity (since 7th grade), and tenderness and mild joint swelling in [name] [name] R knee and L knee with more L knee pain. No erythema.   Neurologically:   Mental status: [name] [name] [name] is awake and alert, paying good  attention. He is able to completely provide [name] [name] history. He is oriented to: person, place, time/date, situation, day of week, month of year and year. His memory, attention, language and knowledge are mildly impaired. There is no aphasia, agnosia, apraxia or anomia. There is a no significant degree of bradyphrenia. Speech is mildly hypophonic with no dysarthria noted. Mood is congruent and affect is blunted and flat.   On 04/15/13: His MMSE (Mini-Mental state exam) score was 30/30. CDT (Clock Drawing Test) score was 4/4. AFT (Animal Fluency Test) score was 4.    On 04/15/2014: MMSE was 25/30, CDT was 4/4, AFT was 10.   On 10/17/2014: MMSE: 27/30, CDT 4/4, AFT 9/min, GDS 2 or 3/15.  Cranial nerves are as described above under HEENT exam. In addition, shoulder shrug is normal with equal shoulder height noted.  Motor exam: Normal bulk, and  strength for age is noted. Tone is not rigid with absence of cogwheeling in [name] [name] extremities. There is overall no significant bradykinesia. There is no drift or rebound. There is no tremor. Romberg is negative. Reflexes are 1+ in [name] [name] upper extremities, trace in both knees and absent in both ankles. Fine motor skills: Finger taps, hand movements, and rapid alternating patting are mildly impaired bilaterally. Foot taps and foot agility are mildly impaired bilaterally.   Cerebellar testing shows no dysmetria or intention tremor on  finger to nose testing. There is no truncal or gait ataxia.   Sensory exam is intact to light touch, pinprick, vibration, temperature sense in [name] [name] upper extremities, and decrease to PP and vibration sense in [name] [name] distal lower extremities, up to knees, R>L.   Gait, station and balance: He stands up from [name] [name] seated position with mild difficulty and needs to push himself up, reporting pain in L knee. He does not seem to straighten out his knees. He does not pick up his feet very well. He walks with a cane on [name] [name] right side. Balance is mildly impaired. Tandem walk is not possible for [name] [name] [name].  Assessment and Plan:   In summary, [name] [name] [name] is a very pleasant 81 year old male with a complex underlying medical history of diabetes, metastatic prostate cancer with evidence of progressive disease, stomach tumor, osteoporosis, heart disease, hyperlipidemia, thyroid disease and hypertension, status post three-vessel CABG in 1998, MI in 2009 with stent placement, on treatment for his recurrent prostate cancer with bony metastases, s/p radiation therapy, who presents for followup consultation of his memory loss. His history and physical exam are keeping with MCI (mild cognitive impairment) or mild dementia without behavioral disturbance, with risk for vascular dementia, given his history. His MMSE scores are stable. He is on maximum dose of Namenda long-acting at 28 mg and has been on 10 mg of generic Aricept for over 15 years. I suggested we continue with [name] [name] current medications and he did not need refills today.  I asked [name] [name] [name] to stay active mentally and physically, but unfortunately his pain has limited his physical activity. I encouraged [name] [name] [name] to eat healthy, exercise daily and keep well hydrated, to keep a scheduled bedtime and wake time routine, to not skip any meals and eat healthy snacks in between meals and to have protein with every meal. I stressed [name] [name] importance of regular exercise, within of course  [name] [name] [name]'s own mobility limitations. Thankfully he has great family support. His children and his grandchildren are involved in his care. I do worry somewhat about his driving. I have asked that he have his driving observed by his daughter or one of his sons. He has some additional help at home through a caretaker for about 3 hours each time, 3 times a week. I've advised that he uses walker at all times for walking especially walking outside and that he asked that she accompany [name] [name] [name] for safety. He has a call alert button.  I suggested a six-month follow-up. I asked [name] [name] [name] to drink more water, to try to increase it up to 6 glasses of water per day if possible. I answered all their questions today and [name] [name] [name] and his daughter   were in agreement with [name] [name] above outlined plan. I encouraged [name] [name] [name] to call with any interim questions, concerns, problems, or updates.  I spent 25 minutes in total face-to-face time with [name] [name] [name], more than 50% of which was spent in counseling and coordination of care, reviewing  test results, reviewing medication and discussing or reviewing [name] [name] diagnosis of dementia, its prognosis and treatment options.

## 2014-10-18 ENCOUNTER — Ambulatory Visit
Admission: RE | Admit: 2014-10-18 | Discharge: 2014-10-18 | Disposition: A | Discharge status: Home / Self Care | Payer: Medicare Other | Source: Ambulatory Visit | Attending: Radiation Oncology | Admitting: Radiation Oncology

## 2014-10-18 ENCOUNTER — Encounter: Payer: Self-pay | Admitting: Radiation Oncology

## 2014-10-18 ENCOUNTER — Telehealth: Payer: Self-pay | Admitting: Internal Medicine

## 2014-10-18 VITALS — BP 151/56 | HR 51 | Temp 97.7°F | Ht 67.0 in | Wt 201.5 lb

## 2014-10-18 DIAGNOSIS — C7951 Secondary malignant neoplasm of bone: Secondary | ICD-10-CM | POA: Diagnosis not present

## 2014-10-18 HISTORY — DX: Personal history of irradiation: Z92.3

## 2014-10-18 HISTORY — DX: Secondary malignant neoplasm of bone: C79.51

## 2014-10-18 NOTE — Telephone Encounter (Signed)
Left message on voicemail to call office.  

## 2014-10-18 NOTE — Telephone Encounter (Signed)
Spoke to pt, asked him if his daughter [name] was there. He said no, I am not sure why she called you. Told pt she called about one of your medications Pioglitazone 15 mg if you were suppose to be taking it. Told pt yes, you are. Pt verbalized understanding and said he will have his daughter call me. Told him okay.

## 2014-10-18 NOTE — Telephone Encounter (Signed)
Pt daughter called and is asking if this is a medicine that the pt should be taking pioglitazone (ACTOS) 15 MG tablet. She said Dr Raliegh Ip changed some of her dad's medicine so she is not sure what should be refilled

## 2014-10-18 NOTE — Progress Notes (Signed)
[facility] NEW PATIENT EVALUATION  Name: [name] [name] MRN: [id number]  Date:   10/18/2014           DOB: [birth year]  Status: outpatient   CC: [name], MD  Wyatt Portela, MD Dr. 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 male who is seen today through the courtesy of Dr. Alen Blew of for consideration of Trudi Ida in the management of his castrate resistant carcinoma the prostate to bone.  He presented with a PSA of 6.23 and Gleason score of 7 (4+3) and underwent external beam/IMRT completing this in March 2007.  He developed recurrent disease after being evaluated for back pain and had a PSA of 28 and 2012.  His disease was most notable at T9.  He responded well to initiation of androgen deprivation therapy with Lupron/Casodex.  His PSA fell to 0.19.  His PSA rose to 3.06 in July 2014 despite castrate levels of testosterone.  His bone scan in July 2014 showed metastatic disease.  He was started on Zytiga in July 2014 and this was discontinued in July 2015.  He was then placed on Xtandi in July 2015 and this was recently discontinued after noting a rise in his PSA from 8.43  3 months ago to 16.06 and late March.  Dr. Alen Blew send some to see me today for discussion of possible Xofigo.  His repeat bone scan on 09/28/2014 shows worsening metastatic involvement of the thoracic spine, pelvis, lesser trochanter of the right femur and ribs compared to his  previous study from April 2015.  He states that he is reasonably comfortable at this time, but he does have mid to upper thoracic spine discomfort.  Of note is that he does have a peripheral neuropathy, presumably related to his diabetes for which he takes Lyrica.  He continues with his Lupron and Xgeva through Dr. Diona Fanti at [facility].  PREVIOUS RADIATION  THERAPY: Status post external beam radiation therapy/IMRT as described above.   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     PAST SURGICAL HISTORY:  Past Surgical History  Procedure Laterality Date  . Coronary artery bypass graft  1998    x5, with a left internal mammary to the LAD and diagonal, vein graft to the OM1 and 2, and a vein graft to the right coronary artery  . Coronary stent placement  11/13/2007    LCX  . Trigger finger repaired  2009  . Laparoscopic gastrectomy N/A 08/18/2012    Procedure: Laparoscopic Partial Gastrectomy;  Surgeon: [name], MD;  Location: [facility];  Service: General;  Laterality: N/A;  Laparoscopic Partial Gastrectomy,     FAMILY HISTORY: family history includes Pneumonia in his father; Stroke in his father. There is no history of Colon cancer.    SOCIAL HISTORY:  reports that he has quit smoking. He quit smokeless tobacco use about 66 years ago. He reports that he does not  drink alcohol or use illicit drugs.  Widowed for the past 3 years.  Retired Forensic psychologist.   ALLERGIES: Codeine phosphate   MEDICATIONS:  Current Outpatient Prescriptions  Medication Sig Dispense Refill  . amLODipine-valsartan (EXFORGE) 10-320 MG per tablet TAKE 1/2 TABLET BY MOUTH EVERY DAY 90 tablet 1  . aspirin EC 81 MG tablet Take 81 mg by mouth every morning.     [name] atorvastatin (LIPITOR) 10 MG tablet TAKE 1  TABLET BY MOUTH EVERY DAY 90 tablet 1  . calcium carbonate-magnesium hydroxide (ROLAIDS) 334 MG CHEW Chew 1 tablet by mouth 3 (three) times daily with meals.     . Calcium Citrate-Vitamin D (CITRACAL + D PO) Take 1 tablet by mouth daily.    . Denosumab (XGEVA [place]) Inject into the skin every 30 (thirty) days. Injects "around the first part of each month."    . donepezil (ARICEPT) 10 MG tablet TAKE 1 TABLET BY MOUTH ONCE DAILY 90 tablet 1  . glimepiride (AMARYL) 4 MG tablet Take 4 mg by mouth daily with breakfast.   4  . Glucosamine-Chondroitin (GLUCOSAMINE CHONDR COMPLEX) 500-400 MG CAPS Take by mouth daily.    [name] glucose blood (ACCU-CHEK AVIVA) test strip USE TO CHECK BLOOD SUGAR DAILY AND PRN 100 each 12  . leuprolide (LUPRON) 3.75 MG injection Inject 3.75 mg into the muscle every 4 (four) months.    . levothyroxine (SYNTHROID, LEVOTHROID) 50 MCG tablet TAKE 1 TABLET BY MOUTH EVERY DAY 90 tablet 1  . LYRICA 50 MG capsule TAKE 2 CAPSULES BY MOUTH EVERY MORNING, THEN TAKE 1 CAPSULE BY MOUTH EVERY EVENING 90 capsule 5  . metFORMIN (GLUCOPHAGE) 1000 MG tablet Take 1 tablet (1,000 mg total) by mouth daily with breakfast. 180 tablet 1  . NAMENDA XR 28 MG CP24 24 hr capsule TAKE ONE CAPSULE BY MOUTH DAILY 30 capsule 2  . nitroGLYCERIN (NITROSTAT) 0.4 MG SL tablet Place 0.4 mg under the tongue every 5 (five) minutes as needed. For chest pain    . Nutritional Supplements (JUICE PLUS FIBRE PO) Take 6 capsules by mouth daily.     [name] omega-3 acid ethyl esters (LOVAZA) 1 G capsule Take 1 g by mouth once a week.     . pioglitazone (ACTOS) 15 MG tablet TAKE 1 TABLET BY MOUTH DAILY 90 tablet 1  . traMADol (ULTRAM) 50 MG tablet TAKE 1 TABLET BY MOUTH EVERY 8 HOURS AS NEEDED 90 tablet 2  . VESICARE 10 MG tablet Take 5 mg by mouth daily.   11  . acetaminophen (TYLENOL) 500 MG tablet Take 1,000 mg by mouth every 6 (six) hours as needed for pain.    . [DISCONTINUED] chlorthalidone (HYGROTON) 25 MG tablet Take 12.5 mg  by mouth daily.     No current facility-administered medications for this encounter.     REVIEW OF SYSTEMS:  Pertinent items are noted in HPI.    PHYSICAL EXAM:  height is 5\' 7"  (1.702 m) and weight is 201 lb 8 oz (91.4 kg). His temperature is 97.7 F (36.5 C). His blood pressure is 151/56 and his pulse is 51.   Alert and oriented 81 year old white male appearing slightly younger than his stated age.  Head and neck examination remarkable for slight right ptosis which has been present since childbirth.  Nodes: Without palpable cervical or supraclavicular lymphadenopathy.  A back: There is pain described along the mid to upper thoracic spine on deep palpation.  There is no other palpable spinal discomfort.  Pelvis: There  is no palpable bony pelvic pain.  Extremities: There is no palpable proximal femur discomfort.   LABORATORY DATA:  Lab Results  Component Value Date   WBC 4.6 09/28/2014   HGB 11.8* 09/28/2014   HCT 36.1* 09/28/2014   MCV 94.2 09/28/2014   PLT 172 09/28/2014   Lab Results  Component Value Date   NA 138 09/28/2014   K 4.8 09/28/2014   CL 101 02/22/2014   CO2 22 09/28/2014   Lab Results  Component Value Date   ALT 12 09/28/2014   AST 15 09/28/2014   ALKPHOS 112 09/28/2014   BILITOT 0.40 09/28/2014     PSA 16.06  IMPRESSION: Metastatic esters resistant carcinoma the prostate.  I explained to the patient and his family Friday of management options for metastatic castrate resistant carcinoma.  The possible options include Xofigo/Radium 223, chemotherapy with Taxotere, and Provenge.  We spent a good deal of time reviewing his past bone scans.  We discussed Xofigo in great detail along with radiation safety issues.  His blood counts are satisfactory.  We discussed the potential acute and late toxicities of Xofigo which is generally well tolerated.  He and his family would like to proceed with Xofigo.  I will contact Dr. Alen Blew to see if we should get a baseline CT of  the abdomen/pelvis at [facility] to make sure that he does not have significant extraosseous disease which would not benefit from Tool.  Of note is he does have renal dysfunction, and I wonder if he should have a noncontrast study.  His PSA is not significantly high that I would expect a fine extensive nodal or liver metastases.  I defer to Dr. Alen Blew.  Consent is signed today.   PLAN: As discussed above.  I spent 60 minutes face to face with the patient and more than 50% of that time was spent in counseling and/or coordination of care.

## 2014-10-18 NOTE — Addendum Note (Signed)
Encounter addended by: Benn Moulder, RN on: 10/18/2014  3:29 PM<BR>     Documentation filed: Charges VN

## 2014-10-24 ENCOUNTER — Encounter: Payer: Self-pay | Admitting: Radiation Oncology

## 2014-10-24 NOTE — Progress Notes (Signed)
Treatment planning note: The patient will receive 6 monthly infusions of Radium 223.  His current weight is 200 pounds or 90.7 kg.  He will receive 1.35 Ci per kilogram or 122.4 Ci.  He'll undergo a monthly CBC and be weighed one week prior to each infusion.   Radiation Oncology         [phone]) [phone] ________________________________  Name: [name] [name] MRN: [id number]  Date: 10/24/2014  DOB: 31-Aug-1929  Xofigo Treatment Planning Note:  Diagnosis:  Castration resistant prostate cancer with painful bone involvement  Narrative: [name] is a patient who has been diagnosed with castration resistant prostate cancer with painful bone involvement.  His most recent blood counts show that he remains a good candidate to proceed with Ra-223.  The patient is going to receive Xofigo for his treatment.   Radiation Treatment Planning:  The prescribed radiation activity will be 50 kBq per kg per infusions. The plan is to offer a total of 6 IV administrations of this agent, assuming the blood counts are adequate prior to each administration, with each infusion done at 4 week intervals.  This will be done as an IV administration in the nuclear medicine department, with care to undertake all radiation protection precautions as recommended.    ------------------------------------------------        Rexene Edison, MD

## 2014-10-24 NOTE — Progress Notes (Signed)
CC: Dr. Bluford Kaufmann,  Dr. Zola Button, Dr. Franchot Gallo  Chart note: Mr. Clerk CT scan of the abdomen/pelvis at [facility] is without evidence for obvious soft tissue metastasis or lymphadenopathy.  We will therefore give him scheduled for initiation of Xofigo.  I spoke with his daughter, Lacy Duverney ([phone]).

## 2014-10-25 ENCOUNTER — Encounter: Payer: Self-pay | Admitting: Internal Medicine

## 2014-10-25 ENCOUNTER — Other Ambulatory Visit: Payer: Self-pay | Admitting: Radiation Oncology

## 2014-10-25 DIAGNOSIS — C7951 Secondary malignant neoplasm of bone: Principal | ICD-10-CM

## 2014-10-25 DIAGNOSIS — C61 Malignant neoplasm of prostate: Secondary | ICD-10-CM

## 2014-10-26 ENCOUNTER — Other Ambulatory Visit: Payer: Self-pay | Admitting: Radiation Oncology

## 2014-10-26 ENCOUNTER — Telehealth: Payer: Self-pay | Admitting: *Deleted

## 2014-10-26 DIAGNOSIS — C7951 Secondary malignant neoplasm of bone: Secondary | ICD-10-CM

## 2014-10-26 NOTE — Telephone Encounter (Signed)
CALLED PATIENT TO INFORM OF XOFIGO DATE, LVM FOR A RETURN CALL

## 2014-10-27 ENCOUNTER — Ambulatory Visit
Admission: RE | Admit: 2014-10-27 | Discharge: 2014-10-27 | Disposition: A | Discharge status: Home / Self Care | Payer: Medicare Other | Source: Ambulatory Visit | Attending: Radiation Oncology | Admitting: Radiation Oncology

## 2014-10-27 DIAGNOSIS — C7951 Secondary malignant neoplasm of bone: Secondary | ICD-10-CM | POA: Diagnosis present

## 2014-10-27 LAB — CBC WITH DIFFERENTIAL/PLATELET
BASO%: 1.1 % (ref 0.0–2.0)
Basophils Absolute: 0 10*3/uL (ref 0.0–0.1)
EOS%: 5.6 % (ref 0.0–7.0)
Eosinophils Absolute: 0.2 10*3/uL (ref 0.0–0.5)
HCT: 34.1 % — ABNORMAL LOW (ref 38.4–49.9)
HGB: 11.5 g/dL — ABNORMAL LOW (ref 13.0–17.1)
LYMPH#: 1.1 10*3/uL (ref 0.9–3.3)
LYMPH%: 28 % (ref 14.0–49.0)
MCH: 32.1 pg (ref 27.2–33.4)
MCHC: 33.7 g/dL (ref 32.0–36.0)
MCV: 95.3 fL (ref 79.3–98.0)
MONO#: 0.4 10*3/uL (ref 0.1–0.9)
MONO%: 10.8 % (ref 0.0–14.0)
NEUT#: 2.1 10*3/uL (ref 1.5–6.5)
NEUT%: 54.5 % (ref 39.0–75.0)
Platelets: 138 10*3/uL — ABNORMAL LOW (ref 140–400)
RBC: 3.58 10*6/uL — ABNORMAL LOW (ref 4.20–5.82)
RDW: 14.5 % (ref 11.0–14.6)
WBC: 3.8 10*3/uL — ABNORMAL LOW (ref 4.0–10.3)
nRBC: 0 % (ref 0–0)

## 2014-11-02 ENCOUNTER — Encounter: Payer: Self-pay | Admitting: Physician Assistant

## 2014-11-02 ENCOUNTER — Ambulatory Visit (INDEPENDENT_AMBULATORY_CARE_PROVIDER_SITE_OTHER): Payer: Medicare Other | Admitting: Physician Assistant

## 2014-11-02 ENCOUNTER — Telehealth: Payer: Self-pay | Admitting: *Deleted

## 2014-11-02 ENCOUNTER — Encounter: Payer: Self-pay | Admitting: Internal Medicine

## 2014-11-02 VITALS — BP 128/64 | HR 72 | Ht 67.0 in | Wt 203.0 lb

## 2014-11-02 DIAGNOSIS — R938 Abnormal findings on diagnostic imaging of other specified body structures: Secondary | ICD-10-CM | POA: Diagnosis not present

## 2014-11-02 DIAGNOSIS — R9389 Abnormal findings on diagnostic imaging of other specified body structures: Secondary | ICD-10-CM

## 2014-11-02 DIAGNOSIS — Z86010 Personal history of colonic polyps: Secondary | ICD-10-CM

## 2014-11-02 MED ORDER — MOVIPREP 100 G PO SOLR: 1.0000 | ORAL | Status: DC

## 2014-11-02 NOTE — Patient Instructions (Addendum)
You have been scheduled for a colonoscopy. Please follow written instructions given to you at your visit today.  Please pick up your prep supplies at the pharmacy within the next 1-3 days. [facility] [address] If you use inhalers (even only as needed), please bring them with you on the day of your procedure.

## 2014-11-02 NOTE — Telephone Encounter (Signed)
CALLED PATIENT TO REMIND OF INJECTION FOR 11-03-14- ARRIVAL TIME - 11:30AM, LVM FOR A RETURN CALL

## 2014-11-02 NOTE — Progress Notes (Signed)
Patient ID: [name] [name], male   DOB: 1929/10/19, 81 y.o.   MRN: [id number]   Subjective:    Patient ID: [name] [name], male    DOB: 1930/02/15, 81 y.o.   MRN: [id number]  HPI [name] [name] is a pleasant 81 year old white male former patient of Dr. Buel Ream who comes in today referred by [facility] after an abnormal CT scan of the abdomen and pelvis. Patient is being followed by Dr. Horton Chin stat for metastatic prostate cancer to the bone and is to start a radio active treatment later this week for his bone metastases. Patient had CT scan of the abdomen and pelvis done without oral or IV contrast 10/24/2014. This showed a large calcified gallstone within the gallbladder 3.5 cm and an opacity at the cecum which may represent stool artifact but impossible to exclude colonic mass area in question 4.8 cm in greatest size. Also had a probable left inguinal hernia containing fat. Patient has no complaints of abdominal pain or discomfort no recent changes in his bowel habits no melena or hematochezia. He last had colonoscopy in January 2011 multiple small polyps removed from the left colon and was found to have a solitary rectal ulcer. Path on the polyps consistent with tubular adenoma and hyperplastic polyps. Patient has in addition to the metastatic prostate cancer history of memory loss, hypertension, adult-onset diabetes mellitus, and coronary artery disease for which she had CABG in 1998 then an MI in 2009 with stent. He is not on any blood thinners. He also had EUS in August 2012 for a gastric mass found on EGD and this was found to be a submucosal spindle cell neoplasm for which she had surgical resection by Dr. Zella Richer.  Review of Systems Pertinent positive and negative review of systems were noted in the above HPI section.  All other review of systems was otherwise negative.  Outpatient Encounter Prescriptions as of 11/02/2014  Medication Sig  . acetaminophen (TYLENOL) 500 MG tablet Take 1,000 mg by  mouth every 6 (six) hours as needed for pain.  [name] amLODipine-valsartan (EXFORGE) 10-320 MG per tablet TAKE 1/2 TABLET BY MOUTH EVERY DAY  . aspirin EC 81 MG tablet Take 81 mg by mouth every morning.   [name] atorvastatin (LIPITOR) 10 MG tablet TAKE 1 TABLET BY MOUTH EVERY DAY  . calcium carbonate-magnesium hydroxide (ROLAIDS) 334 MG CHEW Chew 1 tablet by mouth 3 (three) times daily with meals.   . Calcium Citrate-Vitamin D (CITRACAL + D PO) Take 1 tablet by mouth daily.  . Denosumab (XGEVA [place]) Inject into the skin every 30 (thirty) days. Injects "around the first part of each month."  . donepezil (ARICEPT) 10 MG tablet TAKE 1 TABLET BY MOUTH ONCE DAILY  . glimepiride (AMARYL) 4 MG tablet Take 4 mg by mouth daily with breakfast.   . Glucosamine-Chondroitin (GLUCOSAMINE CHONDR COMPLEX) 500-400 MG CAPS Take by mouth daily.  [name] glucose blood (ACCU-CHEK AVIVA) test strip USE TO CHECK BLOOD SUGAR DAILY AND PRN  . leuprolide (LUPRON) 3.75 MG injection Inject 3.75 mg into the muscle every 4 (four) months.  . levothyroxine (SYNTHROID, LEVOTHROID) 50 MCG tablet TAKE 1 TABLET BY MOUTH EVERY DAY  . LYRICA 50 MG capsule TAKE 2 CAPSULES BY MOUTH EVERY MORNING, THEN TAKE 1 CAPSULE BY MOUTH EVERY EVENING  . metFORMIN (GLUCOPHAGE) 1000 MG tablet Take 1 tablet (1,000 mg total) by mouth daily with breakfast.  . NAMENDA XR 28 MG CP24 24 hr capsule TAKE ONE CAPSULE BY MOUTH DAILY  .  nitroGLYCERIN (NITROSTAT) 0.4 MG SL tablet Place 0.4 mg under the tongue every 5 (five) minutes as needed. For chest pain  . Nutritional Supplements (JUICE PLUS FIBRE PO) Take 6 capsules by mouth daily.   [name] omega-3 acid ethyl esters (LOVAZA) 1 G capsule Take 1 g by mouth once a week.   . traMADol (ULTRAM) 50 MG tablet TAKE 1 TABLET BY MOUTH EVERY 8 HOURS AS NEEDED  . VESICARE 10 MG tablet Take 5 mg by mouth daily.   [name] MOVIPREP 100 G SOLR Take 1 kit (200 g total) by mouth as directed.  . pioglitazone (ACTOS) 15 MG tablet TAKE 1 TABLET BY MOUTH  DAILY   No facility-administered encounter medications on file as of 11/02/2014.   Allergies  Allergen Reactions  . Codeine Phosphate Other (See Comments)    mouth sores   Patient Active Problem List   Diagnosis Date Noted  . Bone metastases 10/18/2014  . Dry mouth 09/08/2012  . 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 Depression 09/19/2011  . Chest pain 08/31/2011  . Fatigue   . Memory loss   . Rectal ulcer   . IHD (ischemic heart disease)   . Constipation, chronic   . Bruises easily   . History of ventricular fibrillation   . Prostate cancer   . IBS (irritable bowel syndrome)   . Colitis   . ED (erectile dysfunction)   . OA (osteoarthritis)   . Mild cognitive impairment 03/22/2010  . Hypothyroidism 07/18/2009  . DIABETIC PERIPHERAL NEUROPATHY 01/10/2009  . LEG CRAMPS, NOCTURNAL 01/14/2008  . IRON DEFICIENCY 12/15/2007  . GERD 12/15/2007  . INTERNAL HEMORRHOIDS 12/14/2007  . ANEMIA NEC 02/04/2007  . BACK PAIN 02/04/2007  . SYMPTOM, MALAISE AND FATIGUE NEC 02/04/2007  . Diabetes mellitus due to underlying condition with diabetic polyneuropathy 12/29/2006  . Dyslipidemia 12/29/2006  . Essential hypertension 12/29/2006  . Coronary atherosclerosis 12/29/2006  . DIVERTICULOSIS, COLON 12/29/2006  . PROSTATE CANCER, HX OF 12/29/2006   History   Social History  . Marital Status: Widowed    Spouse Name: N/A  . Number of Children: 4  . Years of Education: N/A   Occupational History  . retired    Social History Main Topics  . Smoking status: Former Research scientist (life sciences)  . Smokeless tobacco: Former Systems developer    Quit date: 02/11/1948  . Alcohol Use: No  . Drug Use: No  . Sexual Activity: Not on file   Other Topics Concern  . Not on file   Social History Narrative   Right handed, Widowed, 4 kids, Caffeine 2 cups daily, occa soda.  Retired, Chief Executive Officer    [name]'s family history includes Pneumonia in his father;  Stroke in his father. There is no history of Colon cancer.      Objective:    Filed Vitals:   11/02/14 1442  BP: 128/64  Pulse: 72    Physical Exam  well-developed elderly white male in no acute distress he ambulates with a cane accompanied by his daughter, quite pleasant blood pressure 128/64 pulse 72 height 5 foot 7 weight 203. HEENT; nontraumatic normocephalic EOMI PERRLA sclera anicteric, Supple; no JVD, Cardiovascular; regular rate and rhythm with S1-S2 no murmur rub or gallop he does have a sternal incisional scar, Pulmonary; clear bilaterally, Abdomen; soft nondistended nontender bowel sounds are present there is no palpable mass or hepatosplenomegaly, Rectal; exam not done, Ext; no clubbing cyanosis or edema skin warm and dry, Psych ;  mood and affect appropriate       Assessment & Plan:   #1 81 yo male with abnormal noncontrasted CT of abdomen concerning for a 4.8 cm cecal mass, asymptomatic #2 metastatic prostate cancer to the bones #3 CAD- s/p CABG, Mi 2009 with stent, Ef 56 % 2015 #4 HTN #5 AODM #6 memory loss #7 hx of colon polyps-last colon 2011 #8 hx of spindle cell gastric neoplasm -s/p resection 2012  Plan; Long discussion with pt and daughter- will schedule for Colonoscopy with Dr Henrene Pastor. (daughter is pt of Dr Henrene Pastor ). Procedure discussed in detail with pt and he is agreeable to proceed.     Amy S Esterwood PA-C 11/02/2014   Cc: Marletta Lor, MD

## 2014-11-03 ENCOUNTER — Telehealth: Payer: Self-pay | Admitting: Internal Medicine

## 2014-11-03 ENCOUNTER — Encounter: Payer: Self-pay | Admitting: Radiation Oncology

## 2014-11-03 ENCOUNTER — Encounter (HOSPITAL_COMMUNITY)
Admission: RE | Admit: 2014-11-03 | Discharge: 2014-11-03 | Disposition: A | Discharge status: Home / Self Care | Payer: Medicare Other | Source: Ambulatory Visit | Attending: Oncology | Admitting: Oncology

## 2014-11-03 DIAGNOSIS — C61 Malignant neoplasm of prostate: Secondary | ICD-10-CM | POA: Diagnosis present

## 2014-11-03 DIAGNOSIS — C7951 Secondary malignant neoplasm of bone: Secondary | ICD-10-CM

## 2014-11-03 NOTE — Progress Notes (Signed)
  Radiation Oncology         [phone] ________________________________  Name: [name] [name] MRN: [id number]  Date: 11/03/2014  DOB: 08-27-1929  CC: Dr. Zola Button Dr. Hermine Messick, Dr. Franchot Gallo, Romie Jumper (radiation oncology nursing secretary)  Radium-223 Infusion Note  Diagnosis:  Castration resistant prostate cancer with painful bone involvement  Current Infusion:    1  Planned Infusions:  6  Narrative: Mr. KYI ROMANELLO presented to nuclear medicine for treatment. His most recent blood counts were reviewed.  He remains a good candidate to proceed with Ra-223.  The patient was situated in an infusion suite with a contact barrier placed under his arm. Intravenous access was established, using sterile technique, and a normal saline infusion from a syringe was started.  Micro-dosimetry:  The prescribed radiation activity was assayed and confirmed to be within specified tolerance.  Special Treatment Procedure - Infusion:  The nuclear medicine technologist and I personally verified the dose activity to be delivered as specified in the written directive, and verified the patient identification via 2 separate methods.  The syringe containing the dose was attached to a 3 way stopcock, and then the valve was opened to the patient, and the dose delivered over a minute. No complications were noted.  The total administered dose was 137.7 microcuries in a volume of 7.1 cc.  A saline flush of the line and the syringe that contained the isotope was then performed.  The residual radioactivity in the syringe was 3.0 microcuries, so the actual infused isotope activity was 134.7 microcuries.   Pressure was applied to the venipuncture site, and a compression bandage placed.  Radiation Safety personnel were present to perform the discharge survey, as detailed on their documentation.   After a short period of observation, the patient had his IV removed.  Impression:  The patient tolerated  his infusion relatively well.  Plan:  The patient will return in one month for ongoing care.  Addendum: I still with the patient is scheduled for colonoscopy on 12/12/2014.  We will tentatively schedule his next infusion the week of June 20.    ------------------------------------------------        Rexene Edison, MD

## 2014-11-03 NOTE — Progress Notes (Signed)
  Radiation Oncology         [phone] ________________________________  Name: [name] MRN: [id number]  Date: 11/03/2014  DOB: December 29, 1929  Xofigo Treatment Planning Note:  Diagnosis:  Castration resistant prostate cancer with painful bone involvement  Narrative: [name] is a patient who has been diagnosed with castration resistant prostate cancer with painful bone involvement.  His most recent blood counts show that he remains a good candidate to proceed with Ra-223.  The patient is going to receive Xofigo for his treatment.   Radiation Treatment Planning:  The prescribed radiation activity will be 50 kBq per kg per infusions. The plan is to offer a total of 6 IV administrations of this agent, assuming the blood counts are adequate prior to each administration, with each infusion done at 4 week intervals.  This will be done as an IV administration in the nuclear medicine department, with care to undertake all radiation protection precautions as recommended.    ------------------------------------------------        Rexene Edison, MD

## 2014-11-03 NOTE — Telephone Encounter (Signed)
Called and discussed Patient has seen GI and is scheduled for colonoscopy

## 2014-11-03 NOTE — Telephone Encounter (Signed)
Her dad is suppose to start a cancer treatment called Xofigo today. It's administered through the veins. Recently he had a CT scan that showed something in the colon.. They recommend a colonoscopy. Would like a call from Dr Raliegh Ip to discuss this or if he would rather she will schedule an office consultation.

## 2014-11-03 NOTE — Progress Notes (Signed)
Reviewed. Agree with assessment and plans as outlined

## 2014-11-03 NOTE — Telephone Encounter (Signed)
Dr.K, please see message, pt's daughter Lacy Duverney # is [phone].

## 2014-11-07 ENCOUNTER — Other Ambulatory Visit: Payer: Self-pay | Admitting: Internal Medicine

## 2014-11-08 ENCOUNTER — Ambulatory Visit (HOSPITAL_BASED_OUTPATIENT_CLINIC_OR_DEPARTMENT_OTHER): Payer: Medicare Other | Admitting: Oncology

## 2014-11-08 ENCOUNTER — Other Ambulatory Visit (HOSPITAL_BASED_OUTPATIENT_CLINIC_OR_DEPARTMENT_OTHER): Payer: Medicare Other

## 2014-11-08 ENCOUNTER — Telehealth: Payer: Self-pay | Admitting: Internal Medicine

## 2014-11-08 ENCOUNTER — Telehealth: Payer: Self-pay | Admitting: Oncology

## 2014-11-08 VITALS — BP 150/76 | HR 45 | Temp 97.5°F | Resp 18 | Wt 198.5 lb

## 2014-11-08 DIAGNOSIS — C7951 Secondary malignant neoplasm of bone: Secondary | ICD-10-CM

## 2014-11-08 DIAGNOSIS — C61 Malignant neoplasm of prostate: Secondary | ICD-10-CM

## 2014-11-08 DIAGNOSIS — R634 Abnormal weight loss: Secondary | ICD-10-CM

## 2014-11-08 DIAGNOSIS — E291 Testicular hypofunction: Secondary | ICD-10-CM

## 2014-11-08 DIAGNOSIS — R5383 Other fatigue: Secondary | ICD-10-CM | POA: Diagnosis not present

## 2014-11-08 LAB — COMPREHENSIVE METABOLIC PANEL (CC13)
ALT: 10 U/L (ref 0–55)
AST: 13 U/L (ref 5–34)
Albumin: 3.5 g/dL (ref 3.5–5.0)
Alkaline Phosphatase: 174 U/L — ABNORMAL HIGH (ref 40–150)
Anion Gap: 12 mEq/L — ABNORMAL HIGH (ref 3–11)
BILIRUBIN TOTAL: 0.44 mg/dL (ref 0.20–1.20)
BUN: 25.4 mg/dL (ref 7.0–26.0)
CO2: 23 mEq/L (ref 22–29)
Calcium: 9.1 mg/dL (ref 8.4–10.4)
Chloride: 108 mEq/L (ref 98–109)
Creatinine: 1.6 mg/dL — ABNORMAL HIGH (ref 0.7–1.3)
EGFR: 40 mL/min/{1.73_m2} — AB (ref >90)
GLUCOSE: 132 mg/dL (ref 70–140)
Potassium: 4.2 mEq/L (ref 3.5–5.1)
Sodium: 143 mEq/L (ref 136–145)
Total Protein: 6.5 g/dL (ref 6.4–8.3)

## 2014-11-08 LAB — CBC WITH DIFFERENTIAL/PLATELET
BASO%: 0.6 % (ref 0.0–2.0)
BASOS ABS: 0 10*3/uL (ref 0.0–0.1)
EOS ABS: 0.5 10*3/uL (ref 0.0–0.5)
EOS%: 11.2 % — ABNORMAL HIGH (ref 0.0–7.0)
HCT: 35 % — ABNORMAL LOW (ref 38.4–49.9)
HEMOGLOBIN: 11.9 g/dL — AB (ref 13.0–17.1)
LYMPH%: 18.9 % (ref 14.0–49.0)
MCH: 31.8 pg (ref 27.2–33.4)
MCHC: 34.1 g/dL (ref 32.0–36.0)
MCV: 93.3 fL (ref 79.3–98.0)
MONO#: 0.4 10*3/uL (ref 0.1–0.9)
MONO%: 9.1 % (ref 0.0–14.0)
NEUT%: 60.2 % (ref 39.0–75.0)
NEUTROS ABS: 2.7 10*3/uL (ref 1.5–6.5)
PLATELETS: 145 10*3/uL (ref 140–400)
RBC: 3.75 10*6/uL — ABNORMAL LOW (ref 4.20–5.82)
RDW: 14.1 % (ref 11.0–14.6)
WBC: 4.5 10*3/uL (ref 4.0–10.3)
lymph#: 0.9 10*3/uL (ref 0.9–3.3)

## 2014-11-08 NOTE — Telephone Encounter (Signed)
New instructions mailed to pt. 

## 2014-11-08 NOTE — Progress Notes (Signed)
Hematology and Oncology Follow Up Visit  DUTCH ING [id number] 10/25/1929 81 y.o. 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 His initial diagnosis was in 2006 when he presented with a PSA of 6.26 the Gleason score 4+3 equals 7.  Prior Therapy:  He is status post definitive therapy with radiation and androgen deprivation completed in 2007. The patient developed recurrent disease in 2012 with a PSA rise up to 28 and T9 osseous metastasis and was treated with combined androgen deprivation with Lupron and Casodex. He had an excellent response with his PSA nadir down to 0.19. Most recently he developed a rise in his PSA up to 3.06 in July 2014 despite castrate levels of testosterone. His bone scan in July 2014 confirmed the presence of bony disease. Zytiga 1000 mg daily started in July 2014 till July 2015.  Xtandi 160 mg daily started in July of 2015 to April 2016.   Current therapy:  Xofigo started on 11/03/2014. He is status post the first injection out of 6. He receives Niger monthly at [facility] urology. He receives Lupron every 4 months at [facility] urology.  Interim History:  [name] returns today for a followup visit accompanied by his daughter. Since the last visit, received in the first injection of Xofigo without any complications. He did report some fatigue in the last 24 hours but otherwise no other complaints. He continues to have diffuse bony pain predominantly shoulder, hips and knees. He reports he is eating well and have actually gained more weight.  He does not report any lower extremity edema or difficulty breathing. No abdominal pain, nausea, vomiting. His performance status is unchanged. Has not reported any of the fluid retention or any bone pain.  He does not report any headaches or blurry vision. He does not report any  syncope or falls. He does not report chest pain, shortness of breath or difficulty breathing. Did not report any genitourinary bleeding or hemoptysis. Remainder of his review of systems unremarkable.  Medications: I have reviewed the patient's current medications.  Current Outpatient Prescriptions  Medication Sig Dispense Refill  . acetaminophen (TYLENOL) 500 MG tablet Take 1,000 mg by mouth every 6 (six) hours as needed for pain.    [name] amLODipine-valsartan (EXFORGE) 10-320 MG per tablet TAKE 1/2 TABLET BY MOUTH EVERY DAY 90 tablet 1  . aspirin EC 81 MG tablet Take 81 mg by mouth every morning.     [name] atorvastatin (LIPITOR) 10 MG tablet TAKE 1 TABLET BY MOUTH EVERY DAY 90 tablet 1  . calcium carbonate-magnesium hydroxide (ROLAIDS) 334 MG CHEW Chew 1 tablet by mouth 3 (three) times daily with meals.     . Calcium Citrate-Vitamin D (CITRACAL + D PO) Take 1 tablet by mouth daily.    . Denosumab (XGEVA [place]) Inject into the skin every 30 (thirty) days. Injects "around the first part of each month."    . donepezil (ARICEPT) 10 MG tablet TAKE 1 TABLET BY MOUTH ONCE DAILY 90 tablet 0  . glimepiride (AMARYL) 4 MG tablet Take 4 mg by mouth daily with breakfast.   4  . Glucosamine-Chondroitin (GLUCOSAMINE CHONDR COMPLEX) 500-400 MG CAPS Take by mouth daily.    [name] glucose blood (ACCU-CHEK AVIVA) test strip USE TO CHECK BLOOD SUGAR DAILY AND PRN 100 each 12  . leuprolide (LUPRON) 3.75 MG injection Inject 3.75 mg into the muscle every 4 (  four) months.    . levothyroxine (SYNTHROID, LEVOTHROID) 50 MCG tablet TAKE 1 TABLET BY MOUTH EVERY DAY 90 tablet 1  . LYRICA 50 MG capsule TAKE 2 CAPSULES BY MOUTH EVERY MORNING, THEN TAKE 1 CAPSULE BY MOUTH EVERY EVENING 90 capsule 5  . metFORMIN (GLUCOPHAGE) 1000 MG tablet Take 1 tablet (1,000 mg total) by mouth daily with breakfast. 180 tablet 1  . MOVIPREP 100 G SOLR Take 1 kit (200 g total) by mouth as directed. 1 kit 0  . NAMENDA XR 28 MG CP24 24 hr capsule TAKE ONE  CAPSULE BY MOUTH DAILY 30 capsule 2  . nitroGLYCERIN (NITROSTAT) 0.4 MG SL tablet Place 0.4 mg under the tongue every 5 (five) minutes as needed. For chest pain    . Nutritional Supplements (JUICE PLUS FIBRE PO) Take 6 capsules by mouth daily.     [name] omega-3 acid ethyl esters (LOVAZA) 1 G capsule Take 1 g by mouth once a week.     . pioglitazone (ACTOS) 15 MG tablet TAKE 1 TABLET BY MOUTH DAILY 90 tablet 1  . traMADol (ULTRAM) 50 MG tablet TAKE 1 TABLET BY MOUTH EVERY 8 HOURS AS NEEDED 90 tablet 2  . VESICARE 10 MG tablet Take 5 mg by mouth daily.   11  . [DISCONTINUED] chlorthalidone (HYGROTON) 25 MG tablet Take 12.5 mg by mouth daily.     No current facility-administered medications for this visit.     Allergies:  Allergies  Allergen Reactions  . Codeine Phosphate Other (See Comments)    mouth sores    Past Medical History, Surgical history, Social history, and Family History were reviewed and updated.    Physical Exam: Blood pressure 150/76, pulse 45, temperature 97.5 F (36.4 C), temperature source Oral, resp. rate 18, weight 198 lb 8 oz (90.039 kg). ECOG: 1 General appearance: alert and cooperative not in any distress. Head: Normocephalic, without obvious abnormality Neck: no adenopathy Lymph nodes: Cervical, supraclavicular, and axillary nodes normal. Heart:regular rate and rhythm, S1, S2 normal, no murmur, click, rub or gallop Lung:chest clear, no wheezing, rales, or dose to percussion Abdomen: soft, non-tender, without masses or organomegaly EXT:no erythema, induration, or nodules  neurological examination: No deficits.  Lab Results: Lab Results  Component Value Date   WBC 4.5 11/08/2014   HGB 11.9* 11/08/2014   HCT 35.0* 11/08/2014   MCV 93.3 11/08/2014   PLT 145 11/08/2014     Chemistry      Component Value Date/Time   NA 138 09/28/2014 0814   NA 137 02/22/2014 1026   K 4.8 09/28/2014 0814   K 4.3 02/22/2014 1026   CL 101 02/22/2014 1026   CO2 22  09/28/2014 0814   CO2 26 02/22/2014 1026   BUN 30.7* 09/28/2014 0814   BUN 27* 02/22/2014 1026   CREATININE 1.7* 09/28/2014 0814   CREATININE 1.5 02/22/2014 1026   CREATININE 1.38* 02/11/2012 1614      Component Value Date/Time   CALCIUM 9.3 09/28/2014 0814   CALCIUM 10.1 02/22/2014 1026   ALKPHOS 112 09/28/2014 0814   ALKPHOS 54 05/07/2013 1522   AST 15 09/28/2014 0814   AST 22 05/07/2013 1522   ALT 12 09/28/2014 0814   ALT 20 05/07/2013 1522   BILITOT 0.40 09/28/2014 0814   BILITOT 0.7 05/07/2013 1522     Lab Results  Component Value Date   PSA 16.06* 09/28/2014   PSA 12.27* 08/26/2014   PSA 10.31* 07/29/2014     Impression and Plan: This is  an 81 year old gentleman with the following issues: 1. 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 His initial diagnosis dates back to 2006 with PSA of 6.26 and a Gleason score of 7. He progressed on [place].   He is currently on Xofigo and have tolerated it well. He received one treatment and plan to have a total of 6 on a monthly basis. His next Trudi Ida will be delayed because of a colonoscopy scheduling.  2. Bony disease: He is receiving Xgeva at [facility] urology on a monthly basis.  3. Hormonal deprivation: He is receiving Lupron at [facility] urology every 4 months.  4. Fatigue: Could be related to Xofigo, mild anemia and mild bradycardia. We'll continue to monitor this issues were followed.  5. Weight loss: She is no longer an issue at this time.  6. Followup: He will return I will plan on June 30 which will be one week after his second Xofigo injection.  Zola Button, MD 5/10/20163:06 PM

## 2014-11-08 NOTE — Telephone Encounter (Signed)
Pt confirmed labs/ov per 05/10 POF, gave pt AVS and Calendar.... KJ  °

## 2014-11-09 ENCOUNTER — Other Ambulatory Visit: Payer: Self-pay | Admitting: Radiation Oncology

## 2014-11-09 ENCOUNTER — Telehealth: Payer: Self-pay | Admitting: *Deleted

## 2014-11-09 DIAGNOSIS — C7951 Secondary malignant neoplasm of bone: Principal | ICD-10-CM

## 2014-11-09 DIAGNOSIS — C61 Malignant neoplasm of prostate: Secondary | ICD-10-CM

## 2014-11-09 LAB — PSA: PSA: 22.72 ng/mL — AB (ref <=4.00)

## 2014-11-09 NOTE — Telephone Encounter (Signed)
CALLED PATIENT'S DAUGHTER SHERRY ALLOWAY TO INFORM OF XOFIGO INJ. ON 12-21-14- ARRIVAL TIME - 11:30 AM @ [facility] AND WEIGHT ON 12-14-14- @ 11 AM @ [place], SPOKE WITH SHERRY ALLOWAY AND SHE IS AWARE  OF THESE APPTS.

## 2014-11-11 ENCOUNTER — Telehealth: Payer: Self-pay | Admitting: Internal Medicine

## 2014-11-11 NOTE — Telephone Encounter (Signed)
New instructions placed in the mail to pt.

## 2014-11-14 ENCOUNTER — Other Ambulatory Visit: Payer: Self-pay | Admitting: Internal Medicine

## 2014-12-05 ENCOUNTER — Telehealth: Payer: Self-pay | Admitting: Internal Medicine

## 2014-12-05 NOTE — Telephone Encounter (Signed)
Spoke with patient's daughter and she has concerned about her father having a colonoscopy since he is not having any symptoms at this time. She is worried that having this is going to be hard on him. She is not sure what they would do if he did have cancer.  She wants to know if it is worth the risk. He had a hard time with prep on his colonoscopy.(he "messed" all over the house). She is asking if something was found, what would be done then. Please, advise.

## 2014-12-05 NOTE — Telephone Encounter (Signed)
Please let daughter know we understand her concerns- generally surgery is what is need if find a colon mass... I know he has a lot of medical problems.. Can choose to do nothing, could repeat CT scan to confirm again abnormal area in cecum.[name]

## 2014-12-05 NOTE — Telephone Encounter (Signed)
Patient's daughter given options from [place], [place] . Patient's daughter will talk with patient and call back with decision.

## 2014-12-12 ENCOUNTER — Ambulatory Visit (AMBULATORY_SURGERY_CENTER): Payer: Medicare Other | Admitting: Internal Medicine

## 2014-12-12 ENCOUNTER — Encounter: Payer: Self-pay | Admitting: Internal Medicine

## 2014-12-12 VITALS — BP 142/70 | HR 45 | Temp 97.3°F | Resp 20 | Ht 67.0 in | Wt 203.0 lb

## 2014-12-12 DIAGNOSIS — R938 Abnormal findings on diagnostic imaging of other specified body structures: Secondary | ICD-10-CM

## 2014-12-12 DIAGNOSIS — D124 Benign neoplasm of descending colon: Secondary | ICD-10-CM | POA: Diagnosis not present

## 2014-12-12 DIAGNOSIS — K5730 Diverticulosis of large intestine without perforation or abscess without bleeding: Secondary | ICD-10-CM

## 2014-12-12 DIAGNOSIS — D122 Benign neoplasm of ascending colon: Secondary | ICD-10-CM

## 2014-12-12 DIAGNOSIS — R9389 Abnormal findings on diagnostic imaging of other specified body structures: Secondary | ICD-10-CM

## 2014-12-12 LAB — GLUCOSE, CAPILLARY
GLUCOSE-CAPILLARY: 169 mg/dL — AB (ref 65–99)
Glucose-Capillary: 180 mg/dL — ABNORMAL HIGH (ref 65–99)

## 2014-12-12 MED ORDER — SODIUM CHLORIDE 0.9 % IV SOLN: 500.0000 mL | INTRAVENOUS | Status: DC

## 2014-12-12 NOTE — Progress Notes (Signed)
To recovery, report to Mirts, RN, VSS. 

## 2014-12-12 NOTE — Patient Instructions (Signed)
YOU HAD AN ENDOSCOPIC PROCEDURE TODAY AT [facility]:   Refer to the procedure report that was given to you for any specific questions about what was found during the examination.  If the procedure report does not answer your questions, please call your gastroenterologist to clarify.  If you requested that your care partner not be given the details of your procedure findings, then the procedure report has been included in a sealed envelope for you to review at your convenience later.  YOU SHOULD EXPECT: Some feelings of bloating in the abdomen. Passage of more gas than usual.  Walking can help get rid of the air that was put into your GI tract during the procedure and reduce the bloating. If you had a lower endoscopy (such as a colonoscopy or flexible sigmoidoscopy) you may notice spotting of blood in your stool or on the toilet paper. If you underwent a bowel prep for your procedure, you may not have a normal bowel movement for a few days.  Please Note:  You might notice some irritation and congestion in your nose or some drainage.  This is from the oxygen used during your procedure.  There is no need for concern and it should clear up in a day or so.  SYMPTOMS TO REPORT IMMEDIATELY:   Following lower endoscopy (colonoscopy or flexible sigmoidoscopy):  Excessive amounts of blood in the stool  Significant tenderness or worsening of abdominal pains  Swelling of the abdomen that is new, acute  Fever of 100F or higher  For urgent or emergent issues, a gastroenterologist can be reached at any hour by calling [phone].   DIET: Your first meal following the procedure should be a small meal and then it is ok to progress to your normal diet. Heavy or fried foods are harder to digest and may make you feel nauseous or bloated.  Likewise, meals heavy in dairy and vegetables can increase bloating.  Drink plenty of fluids but you should avoid alcoholic beverages for 24  hours.  ACTIVITY:  You should plan to take it easy for the rest of today and you should NOT DRIVE or use heavy machinery until tomorrow (because of the sedation medicines used during the test).    FOLLOW UP: Our staff will call the number listed on your records the next business day following your procedure to check on you and address any questions or concerns that you may have regarding the information given to you following your procedure. If we do not reach you, we will leave a message.  However, if you are feeling well and you are not experiencing any problems, there is no need to return our call.  We will assume that you have returned to your regular daily activities without incident.  If any biopsies were taken you will be contacted by phone or by letter within the next 1-3 weeks.  Please call us at [phone] if you have not heard about the biopsies in 3 weeks.    SIGNATURES/CONFIDENTIALITY: You and/or your care partner have signed paperwork which will be entered into your electronic medical record.  These signatures attest to the fact that that the information above on your After Visit Summary has been reviewed and is understood.  Full responsibility of the confidentiality of this discharge information lies with you and/or your care-partner.  Polyps, diverticulosis, high fiber diet-handouts given  Return to primary care doctor, follow-up with GI as needed.

## 2014-12-12 NOTE — Progress Notes (Signed)
Pt's son, J. R., is not sure about the name of the pt's meds or when he took them last.  He did report that the pt did take a "handful of pills this morning".  Pt is not completely sure of what he took either, d/t his hx of dementia.  They have a student nurse that helps them with filling the pt's medicine box. maw

## 2014-12-12 NOTE — Op Note (Signed)
[place]  [address] [place], [id number]   COLONOSCOPY PROCEDURE REPORT  PATIENT: [name], [name]  MR#: [id number] BIRTHDATE: 27-Oct-1929 , 84  yrs. old GENDER: male ENDOSCOPIST: Eustace Quail, MD REFERRED RS:WNIOE Burnice Logan, M.D. PROCEDURE DATE:  12/12/2014 PROCEDURE:   Colonoscopy, diagnostic and Colonoscopy with snare polypectomy x 2 First Screening Colonoscopy - Avg.  risk and is 50 yrs.  old or older - No.  Prior Negative Screening - Now for repeat screening. N/A  History of Adenoma - Now for follow-up colonoscopy & has been > or = to 3 yrs.  N/A  Polyps removed today? Yes ASA CLASS:   Class III INDICATIONS:Evaluation  imaging study of an abnormality that is likely to be clinically significant, CT scan question cecal mass. Patient has had previous colonoscopies with Dr. Sharlett Iles in 2003 and 2011. MEDICATIONS: Monitored anesthesia care and Propofol 300 mg IV  DESCRIPTION OF PROCEDURE:   After the risks benefits and alternatives of the procedure were thoroughly explained, informed consent was obtained.  The digital rectal exam revealed no abnormalities of the rectum.   The LB [id number]  endoscope was introduced through the anus and advanced to the cecum, which was identified by both the appendix and ileocecal valve. No adverse events experienced.   The quality of the prep was good.  (MoviPrep was used)  The instrument was then slowly withdrawn as the colon was fully examined. Estimated blood loss is zero unless otherwise noted in this procedure report.    COLON FINDINGS: Two polyps measuring 5-6 mm in size were found in the descending colon and ascending colon.  A polypectomy was performed with a cold snare.  The resection was complete, the polyp tissue was completely retrieved and sent to histology.   There was severe diverticulosis noted in the left colon.   The examination was otherwise normal.  Retroflexed views revealed no  abnormalities. The time to cecum = 7.8 Withdrawal time = 14.9   The scope was withdrawn and the procedure completed. COMPLICATIONS: There were no immediate complications.  ENDOSCOPIC IMPRESSION: 1.   Two polyps were found in the descending colon and ascending colon; polypectomy was performed with a cold snare 2.   Severe diverticulosis was noted in the left colon 3.   The examination was otherwise normal . No cecal mass  RECOMMENDATIONS: 1. Return to the care of your primary provider.  GI follow up as needed  eSigned:  Eustace Quail, MD 12/12/2014 2:37 PM   cc: Marletta Lor, MD and The Patient

## 2014-12-12 NOTE — Progress Notes (Signed)
Called to room to assist during endoscopic procedure.  Patient ID and intended procedure confirmed with present staff. Received instructions for my participation in the procedure from the performing physician.  

## 2014-12-12 NOTE — Progress Notes (Signed)
Pt has history of dementia.  We bv\rought the pt's son, D.J. back into admitting to help with his history. maw

## 2014-12-13 ENCOUNTER — Telehealth: Payer: Self-pay | Admitting: *Deleted

## 2014-12-13 NOTE — Telephone Encounter (Signed)
  Follow up Call-  Call back number 12/12/2014  Post procedure Call Back phone  # [phone] pt's home  Permission to leave phone message Yes     Patient questions:  Do you have a fever, pain , or abdominal swelling? No. Pain Score  0 *  Have you tolerated food without any problems? Yes.    Have you been able to return to your normal activities? Yes.    Do you have any questions about your discharge instructions: Diet   No. Medications  No. Follow up visit  No.  Do you have questions or concerns about your Care? No.  Actions: * If pain score is 4 or above: No action needed, pain <4.

## 2014-12-13 NOTE — Telephone Encounter (Signed)
Called patient to remind of lab for 12-14-14 @ 11 am, spoke with patient's daughterLacy Duverney and she is aware of this appt.

## 2014-12-14 ENCOUNTER — Other Ambulatory Visit: Payer: Self-pay

## 2014-12-14 ENCOUNTER — Ambulatory Visit
Admission: RE | Admit: 2014-12-14 | Discharge: 2014-12-14 | Disposition: A | Discharge status: Home / Self Care | Payer: Medicare Other | Source: Ambulatory Visit | Attending: Radiation Oncology | Admitting: Radiation Oncology

## 2014-12-14 DIAGNOSIS — C61 Malignant neoplasm of prostate: Secondary | ICD-10-CM | POA: Diagnosis present

## 2014-12-14 LAB — CBC WITH DIFFERENTIAL/PLATELET
BASO%: 0.2 % (ref 0.0–2.0)
Basophils Absolute: 0 10*3/uL (ref 0.0–0.1)
EOS%: 2.8 % (ref 0.0–7.0)
Eosinophils Absolute: 0.1 10*3/uL (ref 0.0–0.5)
HCT: 33.2 % — ABNORMAL LOW (ref 38.4–49.9)
HGB: 11.2 g/dL — ABNORMAL LOW (ref 13.0–17.1)
LYMPH#: 0.8 10*3/uL — AB (ref 0.9–3.3)
LYMPH%: 19.4 % (ref 14.0–49.0)
MCH: 31 pg (ref 27.2–33.4)
MCHC: 33.7 g/dL (ref 32.0–36.0)
MCV: 92 fL (ref 79.3–98.0)
MONO#: 0.4 10*3/uL (ref 0.1–0.9)
MONO%: 8.1 % (ref 0.0–14.0)
NEUT%: 69.5 % (ref 39.0–75.0)
NEUTROS ABS: 3 10*3/uL (ref 1.5–6.5)
PLATELETS: 146 10*3/uL (ref 140–400)
RBC: 3.61 10*6/uL — AB (ref 4.20–5.82)
RDW: 13.9 % (ref 11.0–14.6)
WBC: 4.3 10*3/uL (ref 4.0–10.3)

## 2014-12-20 ENCOUNTER — Encounter: Payer: Self-pay | Admitting: Internal Medicine

## 2014-12-20 ENCOUNTER — Telehealth: Payer: Self-pay | Admitting: *Deleted

## 2014-12-20 NOTE — Telephone Encounter (Signed)
Called patient to remind of Xofigo Injection on 12-21-14 - arrival time - 2:15 pm  In [facility], lvm for a return call

## 2014-12-21 ENCOUNTER — Encounter (HOSPITAL_COMMUNITY)
Admission: RE | Admit: 2014-12-21 | Discharge: 2014-12-21 | Disposition: A | Discharge status: Home / Self Care | Payer: Medicare Other | Source: Ambulatory Visit | Attending: Radiation Oncology | Admitting: Radiation Oncology

## 2014-12-21 ENCOUNTER — Ambulatory Visit: Payer: Medicare Other | Admitting: Internal Medicine

## 2014-12-21 ENCOUNTER — Encounter: Payer: Self-pay | Admitting: Radiation Oncology

## 2014-12-21 ENCOUNTER — Encounter (HOSPITAL_COMMUNITY): Admission: RE | Admit: 2014-12-21 | Payer: Medicare Other | Source: Ambulatory Visit

## 2014-12-21 DIAGNOSIS — C61 Malignant neoplasm of prostate: Secondary | ICD-10-CM | POA: Diagnosis present

## 2014-12-21 DIAGNOSIS — C7951 Secondary malignant neoplasm of bone: Secondary | ICD-10-CM | POA: Diagnosis not present

## 2014-12-21 NOTE — Progress Notes (Signed)
  Radiation Oncology         [phone] ________________________________  Name: BUTLER VEGH MRN: [id number]  Date: 12/21/2014  DOB: 1930/03/30  CC: Dr. Zola Button   Radium-223 Infusion Note  Diagnosis:  Castration resistant prostate cancer with painful bone involvement  Current Infusion:    2  Planned Infusions:  6  Narrative: Mr. MARIBEL LUIS presented to nuclear medicine for treatment. His most recent blood counts were reviewed.  He remains a good candidate to proceed with Ra-223.  The patient was situated in an infusion suite with a contact barrier placed under his arm. Intravenous access was established, using sterile technique, and a normal saline infusion from a syringe was started.  Micro-dosimetry:  The prescribed radiation activity was assayed and confirmed to be within specified tolerance.  Special Treatment Procedure - Infusion:  The nuclear medicine technologist and I personally verified the dose activity to be delivered as specified in the written directive, and verified the patient identification via 2 separate methods.  Two syringes containing the dose were attached to a 3 way stopcock in succession, and then the valve was opened to the patient, and the dose delivered over a minute. No complications were noted.  The total administered dose was 134.0 microcuries in a volume of 10.48 cc.  A saline flush of the line and the syringe that contained the isotope was then performed.  The residual radioactivity in the syringe was 4.4 microcuries, so the actual infused isotope activity was 129.6 microcuries.   Pressure was applied to the venipuncture site, and a compression bandage placed.  Radiation Safety personnel were present to perform the discharge survey, as detailed on their documentation.   After a short period of observation, the patient had his IV removed.  Impression:  The patient tolerated his infusion relatively well.  Plan:  The patient will return in one month for  ongoing care.    ------------------------------------------------        Rexene Edison, MD

## 2014-12-21 NOTE — Progress Notes (Signed)
Follow-up note:  [name] [name] is seen today prior to his second Radium 223 infusion.  He is without complaints today.  He tolerated his first infusion without difficulty.  Specifically, he denies bone pain.  His blood counts are satisfactory.  Laboratory data: Lab Results  Component Value Date   WBC 4.3 12/14/2014   HGB 11.2* 12/14/2014   HCT 33.2* 12/14/2014   MCV 92.0 12/14/2014   PLT 146 12/14/2014   Impression: Satisfactory progress.  Plan: Proceed with second Radium 223 infusion.  Next infusion in one month.  10 minutes was spent face-to-face with the patient, primarily counseling the patient and coordinating his care.

## 2014-12-22 ENCOUNTER — Ambulatory Visit: Payer: Medicare Other | Admitting: Internal Medicine

## 2014-12-23 ENCOUNTER — Other Ambulatory Visit: Payer: Self-pay | Admitting: Neurology

## 2014-12-29 ENCOUNTER — Other Ambulatory Visit (HOSPITAL_BASED_OUTPATIENT_CLINIC_OR_DEPARTMENT_OTHER): Payer: Medicare Other

## 2014-12-29 ENCOUNTER — Ambulatory Visit (HOSPITAL_BASED_OUTPATIENT_CLINIC_OR_DEPARTMENT_OTHER): Payer: Medicare Other | Admitting: Oncology

## 2014-12-29 ENCOUNTER — Telehealth: Payer: Self-pay | Admitting: Oncology

## 2014-12-29 ENCOUNTER — Other Ambulatory Visit: Payer: Self-pay | Admitting: Radiation Oncology

## 2014-12-29 VITALS — BP 150/62 | HR 51 | Resp 18 | Ht 67.0 in | Wt 195.1 lb

## 2014-12-29 DIAGNOSIS — C7951 Secondary malignant neoplasm of bone: Principal | ICD-10-CM

## 2014-12-29 DIAGNOSIS — E291 Testicular hypofunction: Secondary | ICD-10-CM | POA: Diagnosis not present

## 2014-12-29 DIAGNOSIS — R5383 Other fatigue: Secondary | ICD-10-CM | POA: Diagnosis not present

## 2014-12-29 DIAGNOSIS — C61 Malignant neoplasm of prostate: Secondary | ICD-10-CM

## 2014-12-29 LAB — COMPREHENSIVE METABOLIC PANEL (CC13)
ALBUMIN: 3.7 g/dL (ref 3.5–5.0)
ALT: 14 U/L (ref 0–55)
AST: 13 U/L (ref 5–34)
Alkaline Phosphatase: 79 U/L (ref 40–150)
Anion Gap: 9 mEq/L (ref 3–11)
BUN: 30.4 mg/dL — ABNORMAL HIGH (ref 7.0–26.0)
CALCIUM: 9.9 mg/dL (ref 8.4–10.4)
CO2: 24 mEq/L (ref 22–29)
CREATININE: 1.8 mg/dL — AB (ref 0.7–1.3)
Chloride: 106 mEq/L (ref 98–109)
EGFR: 35 mL/min/{1.73_m2} — AB (ref >90)
GLUCOSE: 247 mg/dL — AB (ref 70–140)
Potassium: 4.3 mEq/L (ref 3.5–5.1)
SODIUM: 139 meq/L (ref 136–145)
TOTAL PROTEIN: 6.6 g/dL (ref 6.4–8.3)
Total Bilirubin: 0.5 mg/dL (ref 0.20–1.20)

## 2014-12-29 LAB — CBC WITH DIFFERENTIAL/PLATELET
BASO%: 0.2 % (ref 0.0–2.0)
Basophils Absolute: 0 10*3/uL (ref 0.0–0.1)
EOS ABS: 0.2 10*3/uL (ref 0.0–0.5)
EOS%: 3.9 % (ref 0.0–7.0)
HEMATOCRIT: 35.8 % — AB (ref 38.4–49.9)
HGB: 12.1 g/dL — ABNORMAL LOW (ref 13.0–17.1)
LYMPH%: 23.4 % (ref 14.0–49.0)
MCH: 30.9 pg (ref 27.2–33.4)
MCHC: 33.8 g/dL (ref 32.0–36.0)
MCV: 91.3 fL (ref 79.3–98.0)
MONO#: 0.3 10*3/uL (ref 0.1–0.9)
MONO%: 7.5 % (ref 0.0–14.0)
NEUT#: 2.7 10*3/uL (ref 1.5–6.5)
NEUT%: 65 % (ref 39.0–75.0)
Platelets: 149 10*3/uL (ref 140–400)
RBC: 3.92 10*6/uL — ABNORMAL LOW (ref 4.20–5.82)
RDW: 14 % (ref 11.0–14.6)
WBC: 4.2 10*3/uL (ref 4.0–10.3)
lymph#: 1 10*3/uL (ref 0.9–3.3)

## 2014-12-29 NOTE — Telephone Encounter (Signed)
per pof to sch pt appt-gave pt avs °

## 2014-12-29 NOTE — Progress Notes (Signed)
Hematology and Oncology Follow Up Visit  [name] [name] [id number] Aug 18, 1929 81 y.o. 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 His initial diagnosis was in 2006 when he presented with a PSA of 6.26 the Gleason score 4+3 equals 7.  Prior Therapy:  He is status post definitive therapy with radiation and androgen deprivation completed in 2007. The patient developed recurrent disease in 2012 with a PSA rise up to 28 and T9 osseous metastasis and was treated with combined androgen deprivation with Lupron and Casodex. He had an excellent response with his PSA nadir down to 0.19. Most recently he developed a rise in his PSA up to 3.06 in July 2014 despite castrate levels of testosterone. His bone scan in July 2014 confirmed the presence of bony disease. Zytiga 1000 mg daily started in July 2014 till July 2015.  Xtandi 160 mg daily started in July of 2015 to April 2016.   Current therapy:  Xofigo started on 11/03/2014. He is status post 2 injections out of 6. He receives [place] monthly at [facility] urology. He receives Lupron every 4 months at [facility] urology.  Interim History:  [name] [name] returns today for a followup visit accompanied by his daughter. Since the last visit, received two injections of Xofigo without any complications. He did report some fatigue. He continues to have diffuse bony pain predominantly shoulder, hips and knees which is predominantly arthritic in nature. He reports he is eating well.  His mobility have not changed and has not reported any syncope or falls. His quality of life and performance status have not changed.   He does not report any lower extremity edema or difficulty breathing. No abdominal pain, nausea, vomiting. His performance status is unchanged. Has not reported any of the fluid retention or any bone pain.   He does not report any headaches or blurry vision. He does not report any syncope or falls. He does not report chest pain, shortness of breath or difficulty breathing. Did not report any genitourinary bleeding or hemoptysis. Remainder of his review of systems unremarkable.  Medications: I have reviewed the patient's current medications.  Current Outpatient Prescriptions  Medication Sig Dispense Refill  . acetaminophen (TYLENOL) 500 MG tablet Take 1,000 mg by mouth every 6 (six) hours as needed for pain.    [name] amLODipine-valsartan (EXFORGE) 10-320 MG per tablet TAKE 1/2 TABLET BY MOUTH EVERY DAY 90 tablet 1  . aspirin EC 81 MG tablet Take 81 mg by mouth every morning.     [name] atorvastatin (LIPITOR) 10 MG tablet TAKE 1 TABLET BY MOUTH EVERY DAY 90 tablet 1  . calcium carbonate-magnesium hydroxide (ROLAIDS) 334 MG CHEW Chew 1 tablet by mouth 3 (three) times daily with meals.     . Calcium Citrate-Vitamin D (CITRACAL + D PO) Take 1 tablet by mouth daily.    . Denosumab (XGEVA [place]) Inject into the skin every 30 (thirty) days. Injects "around the first part of each month."    . donepezil (ARICEPT) 10 MG tablet TAKE 1 TABLET BY MOUTH ONCE DAILY 90 tablet 0  . glimepiride (AMARYL) 4 MG tablet Take 4 mg by mouth daily with breakfast.   4  . Glucosamine-Chondroitin (GLUCOSAMINE CHONDR COMPLEX) 500-400 MG CAPS Take by mouth daily.    [name] glucose blood (ACCU-CHEK AVIVA) test strip USE TO CHECK BLOOD SUGAR DAILY AND PRN 100 each 12  . leuprolide (  LUPRON) 3.75 MG injection Inject 3.75 mg into the muscle every 4 (four) months.    . levothyroxine (SYNTHROID, LEVOTHROID) 50 MCG tablet TAKE 1 TABLET BY MOUTH EVERY DAY 90 tablet 1  . LYRICA 50 MG capsule TAKE 2 CAPSULES BY MOUTH EVERY MORNING THEN TAKE 1 CAPSULE BY MOUTH EVERY EVENING 90 capsule 5  . metFORMIN (GLUCOPHAGE) 1000 MG tablet Take 1 tablet (1,000 mg total) by mouth daily with breakfast. 180 tablet 1  . NAMENDA XR 28 MG CP24 24 hr capsule TAKE ONE CAPSULE BY  MOUTH DAILY 30 capsule 2  . nitroGLYCERIN (NITROSTAT) 0.4 MG SL tablet Place 0.4 mg under the tongue every 5 (five) minutes as needed. For chest pain    . Nutritional Supplements (JUICE PLUS FIBRE PO) Take 6 capsules by mouth daily.     [name] omega-3 acid ethyl esters (LOVAZA) 1 G capsule Take 1 g by mouth once a week.     . pioglitazone (ACTOS) 15 MG tablet TAKE 1 TABLET BY MOUTH DAILY 90 tablet 1  . traMADol (ULTRAM) 50 MG tablet TAKE 1 TABLET BY MOUTH EVERY 8 HOURS AS NEEDED 90 tablet 2  . VESICARE 10 MG tablet Take 5 mg by mouth daily.   11  . [DISCONTINUED] chlorthalidone (HYGROTON) 25 MG tablet Take 12.5 mg by mouth daily.     No current facility-administered medications for this visit.     Allergies:  Allergies  Allergen Reactions  . Codeine Phosphate Other (See Comments)    mouth sores    Past Medical History, Surgical history, Social history, and Family History were reviewed and updated.    Physical Exam: Blood pressure 150/62, pulse 51, resp. rate 18, height 5\' 7"  (1.702 m), weight 195 lb 1.6 oz (88.497 kg), SpO2 100 %. ECOG: 1 General appearance: alert and cooperative not in any distress. Head: Normocephalic, without obvious abnormality Neck: no adenopathy Lymph nodes: Cervical, supraclavicular, and axillary nodes normal. Heart:regular rate and rhythm, S1, S2 normal, no murmur, click, rub or gallop Lung:chest clear, no wheezing, rales.  Abdomen: soft, non-tender, without masses or organomegaly. No rebound or guarding. EXT:no erythema, induration, or nodules. Trace edema noted. Neurological examination: No deficits.  Lab Results: Lab Results  Component Value Date   WBC 4.2 12/29/2014   HGB 12.1* 12/29/2014   HCT 35.8* 12/29/2014   MCV 91.3 12/29/2014   PLT 149 12/29/2014     Chemistry      Component Value Date/Time   NA 143 11/08/2014 1429   NA 137 02/22/2014 1026   K 4.2 11/08/2014 1429   K 4.3 02/22/2014 1026   CL 101 02/22/2014 1026   CO2 23 11/08/2014  1429   CO2 26 02/22/2014 1026   BUN 25.4 11/08/2014 1429   BUN 27* 02/22/2014 1026   CREATININE 1.6* 11/08/2014 1429   CREATININE 1.5 02/22/2014 1026   CREATININE 1.38* 02/11/2012 1614      Component Value Date/Time   CALCIUM 9.1 11/08/2014 1429   CALCIUM 10.1 02/22/2014 1026   ALKPHOS 174* 11/08/2014 1429   ALKPHOS 54 05/07/2013 1522   AST 13 11/08/2014 1429   AST 22 05/07/2013 1522   ALT 10 11/08/2014 1429   ALT 20 05/07/2013 1522   BILITOT 0.44 11/08/2014 1429   BILITOT 0.7 05/07/2013 1522     Lab Results  Component Value Date   PSA 22.72* 11/08/2014   PSA 16.06* 09/28/2014   PSA 12.27* 08/26/2014     Impression and Plan: This is an 81 year old gentleman with  the following issues: 1. 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 His initial diagnosis dates back to 2006 with PSA of 6.26 and a Gleason score of 7. He progressed on [place].   He is currently on Xofigo and have tolerated it well. After 2 treatments, he has not reported any major ill effects. His alkaline phosphatase prior to treatment was elevated and we'll continue to monitor along with PSA. He understands that he needs to go through the full course of treatment before we see for benefit. I do not anticipate changing the course of treatment unless he has any complications from [facility].   2. Bony disease: He is receiving Xgeva at [facility] urology on a monthly basis.  3. Hormonal deprivation: He is receiving Lupron at [facility] urology every 4 months.  4. Fatigue: Unchanged dramatically at this time.  5. Weight loss: She is no longer an issue at this time.  6. Recent colonoscopy: He had polyps removed and the pathology was reviewed today and showed benign findings without any malignancy.  7. Followup: He will return 5-6 weeks for an evaluation.  Zola Button, MD 6/30/20161:56 PM

## 2014-12-30 ENCOUNTER — Telehealth: Payer: Self-pay | Admitting: *Deleted

## 2014-12-30 LAB — PSA: PSA: 16.51 ng/mL — ABNORMAL HIGH (ref <=4.00)

## 2014-12-30 NOTE — Telephone Encounter (Signed)
This RN spoke with wife and gave her patient's PSA results. She verbalized understanding.

## 2014-12-30 NOTE — Telephone Encounter (Signed)
-----   Message from Wyatt Portela, MD sent at 12/30/2014 10:06 AM EDT ----- Please call his PSA

## 2014-12-30 NOTE — Telephone Encounter (Signed)
CALLED PATIENT'S DAUGHTER - SHERRY ALLOWAY TO INFORM OF LABS AND XOFIGO INJ., SPOKE WITH MS. ALLOWAY AND SHE IS AWARE OF THESE APPTS.

## 2015-01-04 ENCOUNTER — Encounter: Payer: Self-pay | Admitting: Internal Medicine

## 2015-01-04 ENCOUNTER — Ambulatory Visit (INDEPENDENT_AMBULATORY_CARE_PROVIDER_SITE_OTHER): Payer: Medicare Other | Admitting: Internal Medicine

## 2015-01-04 VITALS — BP 130/80 | HR 48 | Temp 97.8°F | Resp 20 | Ht 67.0 in | Wt 199.0 lb

## 2015-01-04 DIAGNOSIS — Z23 Encounter for immunization: Secondary | ICD-10-CM | POA: Diagnosis not present

## 2015-01-04 DIAGNOSIS — I10 Essential (primary) hypertension: Secondary | ICD-10-CM | POA: Diagnosis not present

## 2015-01-04 DIAGNOSIS — E0842 Diabetes mellitus due to underlying condition with diabetic polyneuropathy: Secondary | ICD-10-CM

## 2015-01-04 LAB — HEMOGLOBIN A1C: HEMOGLOBIN A1C: 8.7 % — AB (ref 4.6–6.5)

## 2015-01-04 NOTE — Progress Notes (Signed)
Pre visit review using our clinic review tool, if applicable. No additional management support is needed unless otherwise documented below in the visit note. 

## 2015-01-04 NOTE — Progress Notes (Signed)
Subjective:    Patient ID: [name] [name], male    DOB: [birth year], 81 y.o.   MRN: [id number]  HPI  Lab Results  Component Value Date   HGBA1C 6.9* 10/05/2014    Wt Readings from Last 3 Encounters:  01/04/15 199 lb (90.266 kg)  12/29/14 195 lb 1.6 oz (88.497 kg)  12/12/14 203 lb (92.3 kg)   81 year old patient who is seen today for follow-up of diabetes.  This has been complicated by diabetic peripheral neuropathy.  His last hemoglobin A1c was well controlled at 6.9.  For the past couple months.  He states blood sugars have been consistently elevated in the 200 range.  No real change in his diet.  Her general status He does state his PSA has improved somewhat. He has essential hypertension  Past Medical History  Diagnosis Date  . Hypertension   . Hyperlipidemia   . Memory loss     mild  . Rectal ulcer     RESOLVED  . Constipation   . History of ventricular fibrillation 1998    while on treadmill  . Hypothyroidism   . IBS (irritable bowel syndrome)   . Colitis     RESOLVED  . ED (erectile dysfunction)   . Osteoporosis   . Depression   . Diabetes mellitus     fasting blood sugar 140-200 usually  . GERD (gastroesophageal reflux disease)   . Heart attack 2009  . Dementia   . Neuromuscular disorder     DIABETIC NEUROPATHY  . Prostate cancer 2007  . Pain     LOWER BACK  -- PAST HX OF STEROID EPIDURALS FOR PAIN RELIEF  . 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 CAD (coronary artery disease)     a. S/P CABG x 5 1998; b.  S/P stent LCX 2009;  c.  LHC 5/09: RCA proximal 90%, 95%, distally occluded, pCFX stents patent, OM1 30%, small left atrial branch 95%, oLAD occluded, S-dRCA patent, L-LAD/Dx patent, EF 60%=> Med Rx  ;  d.  Lex MV 3/13:  EF 63%, no ischemia  e.  Lex Myoview 03/08/13:  EF 56%, inf and apical defect suggestive of  thinning and soft tissue attenuation, no ischemia; low risk.    . Bone metastases   . S/P radiation therapy 07/17/2005 - 09/10/2005    Prostate 7800 cGy, 40 Sessions  . Cataract     bil eyes    History   Social History  . Marital Status: Widowed    Spouse Name: N/A  . Number of Children: 4  . Years of Education: N/A   Occupational History  . retired    Social History Main Topics  . Smoking status: Former Research scientist (life sciences)  . Smokeless tobacco: Former Systems developer    Quit date: 02/11/1948  . Alcohol Use: No  . Drug Use: No  . Sexual Activity: Not on file   Other Topics Concern  . Not on file   Social History Narrative   Right handed, Widowed, 4 kids, Caffeine 2 cups daily, occa soda.  Retired, Chief Executive Officer    Past Surgical History  Procedure Laterality Date  . Coronary artery bypass graft  1998    x5, with a left internal mammary to the LAD and diagonal, vein graft to the OM1 and 2, and a vein graft to the right coronary artery  . Coronary stent placement  11/13/2007  LCX  . Trigger finger repaired  2009  . Laparoscopic gastrectomy N/A 08/18/2012    Procedure: Laparoscopic Partial Gastrectomy;  Surgeon: [name], MD;  Location: [facility];  Service: General;  Laterality: N/A;  Laparoscopic Partial Gastrectomy,  . Colonoscopy    . Stomach surgery    . Upper gastrointestinal endoscopy      Family History  Problem Relation Age of Onset  . Stroke Father   . Pneumonia Father   . Colon cancer Neg Hx   . Esophageal cancer Neg Hx   . Rectal cancer Neg Hx   . Stomach cancer Neg Hx     Allergies  Allergen Reactions  . Codeine Phosphate Other (See Comments)    mouth sores    Current Outpatient Prescriptions on File Prior to Visit  Medication Sig Dispense Refill  . acetaminophen (TYLENOL) 500 MG tablet Take 1,000 mg by mouth every 6 (six) hours as needed for pain.    [name] amLODipine-valsartan (EXFORGE) 10-320 MG per tablet TAKE 1/2 TABLET BY MOUTH EVERY DAY 90 tablet 1  . aspirin EC 81 MG  tablet Take 81 mg by mouth every morning.     [name] atorvastatin (LIPITOR) 10 MG tablet TAKE 1 TABLET BY MOUTH EVERY DAY 90 tablet 1  . calcium carbonate-magnesium hydroxide (ROLAIDS) 334 MG CHEW Chew 1 tablet by mouth 3 (three) times daily with meals.     . Calcium Citrate-Vitamin D (CITRACAL + D PO) Take 1 tablet by mouth daily.    . Denosumab (XGEVA [place]) Inject into the skin every 30 (thirty) days. Injects "around the first part of each month."    . donepezil (ARICEPT) 10 MG tablet TAKE 1 TABLET BY MOUTH ONCE DAILY 90 tablet 0  . glimepiride (AMARYL) 4 MG tablet Take 4 mg by mouth daily with breakfast.   4  . Glucosamine-Chondroitin (GLUCOSAMINE CHONDR COMPLEX) 500-400 MG CAPS Take by mouth daily.    [name] glucose blood (ACCU-CHEK AVIVA) test strip USE TO CHECK BLOOD SUGAR DAILY AND PRN 100 each 12  . leuprolide (LUPRON) 3.75 MG injection Inject 3.75 mg into the muscle every 4 (four) months.    . levothyroxine (SYNTHROID, LEVOTHROID) 50 MCG tablet TAKE 1 TABLET BY MOUTH EVERY DAY 90 tablet 1  . LYRICA 50 MG capsule TAKE 2 CAPSULES BY MOUTH EVERY MORNING THEN TAKE 1 CAPSULE BY MOUTH EVERY EVENING 90 capsule 5  . metFORMIN (GLUCOPHAGE) 1000 MG tablet Take 1 tablet (1,000 mg total) by mouth daily with breakfast. 180 tablet 1  . NAMENDA XR 28 MG CP24 24 hr capsule TAKE ONE CAPSULE BY MOUTH DAILY 30 capsule 2  . nitroGLYCERIN (NITROSTAT) 0.4 MG SL tablet Place 0.4 mg under the tongue every 5 (five) minutes as needed. For chest pain    . Nutritional Supplements (JUICE PLUS FIBRE PO) Take 6 capsules by mouth daily.     [name] omega-3 acid ethyl esters (LOVAZA) 1 G capsule Take 1 g by mouth once a week.     . pioglitazone (ACTOS) 15 MG tablet TAKE 1 TABLET BY MOUTH DAILY 90 tablet 1  . traMADol (ULTRAM) 50 MG tablet TAKE 1 TABLET BY MOUTH EVERY 8 HOURS AS NEEDED 90 tablet 2  . VESICARE 10 MG tablet Take 5 mg by mouth daily.   11  . [DISCONTINUED] chlorthalidone (HYGROTON) 25 MG tablet Take 12.5 mg by mouth daily.      No current facility-administered medications on file prior to visit.    BP 138/80 mmHg  Pulse 48  Temp(Src) 97.8 F (36.6 C) (Oral)  Resp 20  Ht 5\' 7"  (1.702 m)  Wt 199 lb (90.266 kg)  BMI 31.16 kg/m2  SpO2 98%       Review of Systems  Constitutional: Negative for fever, chills, appetite change and fatigue.  HENT: Negative for congestion, dental problem, ear pain, hearing loss, sore throat, tinnitus, trouble swallowing and voice change.   Eyes: Negative for pain, discharge and visual disturbance.  Respiratory: Negative for cough, chest tightness, wheezing and stridor.   Cardiovascular: Negative for chest pain, palpitations and leg swelling.  Gastrointestinal: Negative for nausea, vomiting, abdominal pain, diarrhea, constipation, blood in stool and abdominal distention.  Genitourinary: Negative for urgency, hematuria, flank pain, discharge, difficulty urinating and genital sores.  Musculoskeletal: Negative for myalgias, back pain, joint swelling, arthralgias, gait problem and neck stiffness.  Skin: Negative for rash.  Neurological: Positive for weakness and numbness. Negative for dizziness, syncope, speech difficulty and headaches.  Hematological: Negative for adenopathy. Does not bruise/bleed easily.  Psychiatric/Behavioral: Negative for behavioral problems and dysphoric mood. The patient is not nervous/anxious.        Objective:   Physical Exam  Constitutional: He is oriented to person, place, and time. He appears well-developed.  Blood pressure 130/80  HENT:  Head: Normocephalic.  Right Ear: External ear normal.  Left Ear: External ear normal.  Eyes: Conjunctivae and EOM are normal.  Neck: Normal range of motion.  Cardiovascular: Normal rate and normal heart sounds.   Pulmonary/Chest: Breath sounds normal.  Abdominal: Bowel sounds are normal.  Musculoskeletal: Normal range of motion. He exhibits no edema or tenderness.  Neurological: He is alert and oriented to  person, place, and time.  Psychiatric: He has a normal mood and affect. His behavior is normal.          Assessment & Plan:   Diabetes mellitus.  Will check a hemoglobin A1c.  Likely will need to intensify therapy Hypertension, stable Prostate cancer  Recheck 3 months Low-salt diet, weight loss.  All encouraged

## 2015-01-04 NOTE — Patient Instructions (Signed)
Please check your hemoglobin A1c every 3 months  Limit your sodium (Salt) intake  You need to lose weight.  Consider a lower calorie diet and regular exercise. 

## 2015-01-09 ENCOUNTER — Ambulatory Visit (INDEPENDENT_AMBULATORY_CARE_PROVIDER_SITE_OTHER): Payer: Medicare Other | Admitting: Internal Medicine

## 2015-01-09 ENCOUNTER — Encounter: Payer: Self-pay | Admitting: Internal Medicine

## 2015-01-09 VITALS — BP 124/70 | HR 47 | Temp 97.6°F | Resp 20 | Ht 67.0 in | Wt 205.0 lb

## 2015-01-09 DIAGNOSIS — I10 Essential (primary) hypertension: Secondary | ICD-10-CM

## 2015-01-09 DIAGNOSIS — E0842 Diabetes mellitus due to underlying condition with diabetic polyneuropathy: Secondary | ICD-10-CM

## 2015-01-09 MED ORDER — LIRAGLUTIDE 18 MG/3ML ~~LOC~~ SOPN: 0.6000 mg | PEN_INJECTOR | Freq: Every morning | SUBCUTANEOUS | Status: DC

## 2015-01-09 NOTE — Progress Notes (Signed)
Subjective:    Patient ID: [name] [name], male    DOB: [birth year], 81 y.o.   MRN: [id number]  HPI  Wt Readings from Last 3 Encounters:  01/09/15 205 lb (92.987 kg)  01/04/15 199 lb (90.266 kg)  12/29/14 195 lb 1.6 oz (88.497 kg)   Lab Results  Component Value Date   HGBA1C 8.7* 01/04/2015   81 year old patient who has multiple medical problems.  He has diabetes, kidney by peripheral neuropathy.  3 months ago.  Hemoglobin A1c was 6.9, but now has increased to 8.7.  There has been weight gain and he admits to dietary noncompliance.  He is on oral therapy.  Past Medical History  Diagnosis Date  . Hypertension   . Hyperlipidemia   . Memory loss     mild  . Rectal ulcer     RESOLVED  . Constipation   . History of ventricular fibrillation 1998    while on treadmill  . Hypothyroidism   . IBS (irritable bowel syndrome)   . Colitis     RESOLVED  . ED (erectile dysfunction)   . Osteoporosis   . Depression   . Diabetes mellitus     fasting blood sugar 140-200 usually  . GERD (gastroesophageal reflux disease)   . Heart attack 2009  . Dementia   . Neuromuscular disorder     DIABETIC NEUROPATHY  . Prostate cancer 2007  . Pain     LOWER BACK  -- PAST HX OF STEROID EPIDURALS FOR PAIN RELIEF  . 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 CAD (coronary artery disease)     a. S/P CABG x 5 1998; b.  S/P stent LCX 2009;  c.  LHC 5/09: RCA proximal 90%, 95%, distally occluded, pCFX stents patent, OM1 30%, small left atrial branch 95%, oLAD occluded, S-dRCA patent, L-LAD/Dx patent, EF 60%=> Med Rx  ;  d.  Lex MV 3/13:  EF 63%, no ischemia  e.  Lex Myoview 03/08/13:  EF 56%, inf and apical defect suggestive of thinning and soft tissue attenuation, no ischemia; low risk.    . Bone metastases   . S/P radiation therapy 07/17/2005 - 09/10/2005    Prostate 7800  cGy, 40 Sessions  . Cataract     bil eyes    History   Social History  . Marital Status: Widowed    Spouse Name: N/A  . Number of Children: 4  . Years of Education: N/A   Occupational History  . retired    Social History Main Topics  . Smoking status: Former Research scientist (life sciences)  . Smokeless tobacco: Former Systems developer    Quit date: 02/11/1948  . Alcohol Use: No  . Drug Use: No  . Sexual Activity: Not on file   Other Topics Concern  . Not on file   Social History Narrative   Right handed, Widowed, 4 kids, Caffeine 2 cups daily, occa soda.  Retired, Chief Executive Officer    Past Surgical History  Procedure Laterality Date  . Coronary artery bypass graft  1998    x5, with a left internal mammary to the LAD and diagonal, vein graft to the OM1 and 2, and a vein graft to the right coronary artery  . Coronary stent placement  11/13/2007    LCX  . Trigger finger repaired  2009  . Laparoscopic gastrectomy N/A 08/18/2012    Procedure: Laparoscopic Partial Gastrectomy;  Surgeon: [name], MD;  Location: [facility];  Service: General;  Laterality: N/A;  Laparoscopic Partial Gastrectomy,  . Colonoscopy    . Stomach surgery    . Upper gastrointestinal endoscopy      Family History  Problem Relation Age of Onset  . Stroke Father   . Pneumonia Father   . Colon cancer Neg Hx   . Esophageal cancer Neg Hx   . Rectal cancer Neg Hx   . Stomach cancer Neg Hx     Allergies  Allergen Reactions  . Codeine Phosphate Other (See Comments)    mouth sores    Current Outpatient Prescriptions on File Prior to Visit  Medication Sig Dispense Refill  . acetaminophen (TYLENOL) 500 MG tablet Take 1,000 mg by mouth every 6 (six) hours as needed for pain.    [name] amLODipine-valsartan (EXFORGE) 10-320 MG per tablet TAKE 1/2 TABLET BY MOUTH EVERY DAY 90 tablet 1  . aspirin EC 81 MG tablet Take 81 mg by mouth every morning.     [name] atorvastatin (LIPITOR) 10 MG tablet TAKE 1 TABLET BY MOUTH EVERY DAY 90 tablet 1  . calcium  carbonate-magnesium hydroxide (ROLAIDS) 334 MG CHEW Chew 1 tablet by mouth 3 (three) times daily with meals.     . Calcium Citrate-Vitamin D (CITRACAL + D PO) Take 1 tablet by mouth daily.    . Denosumab (XGEVA [place]) Inject into the skin every 30 (thirty) days. Injects "around the first part of each month."    . donepezil (ARICEPT) 10 MG tablet TAKE 1 TABLET BY MOUTH ONCE DAILY 90 tablet 0  . glimepiride (AMARYL) 4 MG tablet Take 4 mg by mouth daily with breakfast.   4  . Glucosamine-Chondroitin (GLUCOSAMINE CHONDR COMPLEX) 500-400 MG CAPS Take by mouth daily.    [name] glucose blood (ACCU-CHEK AVIVA) test strip USE TO CHECK BLOOD SUGAR DAILY AND PRN 100 each 12  . leuprolide (LUPRON) 3.75 MG injection Inject 3.75 mg into the muscle every 4 (four) months.    . levothyroxine (SYNTHROID, LEVOTHROID) 50 MCG tablet TAKE 1 TABLET BY MOUTH EVERY DAY 90 tablet 1  . LYRICA 50 MG capsule TAKE 2 CAPSULES BY MOUTH EVERY MORNING THEN TAKE 1 CAPSULE BY MOUTH EVERY EVENING 90 capsule 5  . metFORMIN (GLUCOPHAGE) 1000 MG tablet Take 1 tablet (1,000 mg total) by mouth daily with breakfast. 180 tablet 1  . NAMENDA XR 28 MG CP24 24 hr capsule TAKE ONE CAPSULE BY MOUTH DAILY 30 capsule 2  . nitroGLYCERIN (NITROSTAT) 0.4 MG SL tablet Place 0.4 mg under the tongue every 5 (five) minutes as needed. For chest pain    . Nutritional Supplements (JUICE PLUS FIBRE PO) Take 6 capsules by mouth daily.     [name] omega-3 acid ethyl esters (LOVAZA) 1 G capsule Take 1 g by mouth once a week.     . pioglitazone (ACTOS) 15 MG tablet TAKE 1 TABLET BY MOUTH DAILY 90 tablet 1  . traMADol (ULTRAM) 50 MG tablet TAKE 1 TABLET BY MOUTH EVERY 8 HOURS AS NEEDED 90 tablet 2  . VESICARE 10 MG tablet Take 5 mg by mouth daily.   11  . [DISCONTINUED] chlorthalidone (HYGROTON) 25 MG tablet Take 12.5 mg by mouth daily.     No current facility-administered medications on file prior to visit.    BP 158/70 mmHg  Pulse 47  Temp(Src) 97.6 F (36.4 C) (Oral)   Resp 20  Ht 5\' 7"  (1.702 m)  Wt 205 lb (  92.987 kg)  BMI 32.10 kg/m2  SpO2 98%     Review of Systems  Constitutional: Negative for fever, chills, appetite change and fatigue.  HENT: Negative for congestion, dental problem, ear pain, hearing loss, sore throat, tinnitus, trouble swallowing and voice change.   Eyes: Negative for pain, discharge and visual disturbance.  Respiratory: Negative for cough, chest tightness, wheezing and stridor.   Cardiovascular: Negative for chest pain, palpitations and leg swelling.  Gastrointestinal: Negative for nausea, vomiting, abdominal pain, diarrhea, constipation, blood in stool and abdominal distention.  Genitourinary: Negative for urgency, hematuria, flank pain, discharge, difficulty urinating and genital sores.  Musculoskeletal: Negative for myalgias, back pain, joint swelling, arthralgias, gait problem and neck stiffness.  Skin: Negative for rash.  Neurological: Negative for dizziness, syncope, speech difficulty, weakness, numbness and headaches.  Hematological: Negative for adenopathy. Does not bruise/bleed easily.  Psychiatric/Behavioral: Negative for behavioral problems and dysphoric mood. The patient is not nervous/anxious.        Objective:   Physical Exam  Constitutional: He appears well-developed and well-nourished. No distress.  Repeat blood pressure down to 120 over 70          Assessment & Plan:   Diabetes mellitus.  Poor control.  Dietary factors discussed at length Will add GLP-1. Continue home blood pressure and blood sugar monitoring Weight loss encouraged

## 2015-01-09 NOTE — Progress Notes (Signed)
Pre visit review using our clinic review tool, if applicable. No additional management support is needed unless otherwise documented below in the visit note. 

## 2015-01-09 NOTE — Patient Instructions (Signed)
You need to lose weight.  Consider a lower calorie diet and regular exercise.  Victoza 0.6 mg daily

## 2015-01-10 ENCOUNTER — Telehealth: Payer: Self-pay | Admitting: *Deleted

## 2015-01-10 ENCOUNTER — Telehealth: Payer: Self-pay | Admitting: Internal Medicine

## 2015-01-10 MED ORDER — INSULIN PEN NEEDLE 32G X 4 MM MISC: Status: DC

## 2015-01-10 NOTE — Telephone Encounter (Signed)
Spoke to pt, told him need to inject 0.6 mg of insulin daily. Pt verbalized understanding. Also told pt I am ordering pen needles for you they will be at the pharmacy along with the insulin. Pt verbalized understanding.

## 2015-01-10 NOTE — Telephone Encounter (Signed)
Pt was given Victoza samples, but wasn't told just how much he should take, so he's not sure of what do to. Please advise.

## 2015-01-10 NOTE — Telephone Encounter (Signed)
CALLED PATIENT TO INFORM OF BLOOD WORK ON 01-16-15 AND HIS XOFIGO INJ. ON 01-23-15, LVM FOR A RETURN CALL

## 2015-01-10 NOTE — Telephone Encounter (Signed)
CALLED PATIENT'S DAUGHTER SHERRY ALLOWAY TO INFORM OF LAB APPT. AND XOFIGO INJ. HAS BEEN MOVED TO 01-24-15, LVM FOR A RETURN CALL

## 2015-01-13 ENCOUNTER — Other Ambulatory Visit: Payer: Self-pay | Admitting: Internal Medicine

## 2015-01-16 ENCOUNTER — Ambulatory Visit: Payer: Medicare Other

## 2015-01-16 ENCOUNTER — Telehealth: Payer: Self-pay | Admitting: *Deleted

## 2015-01-16 NOTE — Telephone Encounter (Signed)
Called patient's daughterLacy Duverney to remind of her dad's lab appt. For 01-17-15 @ 12 pm, lvm for a return call

## 2015-01-17 ENCOUNTER — Other Ambulatory Visit: Payer: Self-pay | Admitting: Radiation Oncology

## 2015-01-17 ENCOUNTER — Telehealth: Payer: Self-pay | Admitting: Radiation Oncology

## 2015-01-17 ENCOUNTER — Ambulatory Visit
Admission: RE | Admit: 2015-01-17 | Discharge: 2015-01-17 | Disposition: A | Discharge status: Home / Self Care | Payer: Medicare Other | Source: Ambulatory Visit | Attending: Radiation Oncology | Admitting: Radiation Oncology

## 2015-01-17 DIAGNOSIS — C7951 Secondary malignant neoplasm of bone: Secondary | ICD-10-CM

## 2015-01-17 LAB — CBC WITH DIFFERENTIAL/PLATELET
BASO%: 0.7 % (ref 0.0–2.0)
Basophils Absolute: 0 10*3/uL (ref 0.0–0.1)
EOS ABS: 0.1 10*3/uL (ref 0.0–0.5)
EOS%: 3.3 % (ref 0.0–7.0)
HCT: 34.8 % — ABNORMAL LOW (ref 38.4–49.9)
HEMOGLOBIN: 11.8 g/dL — AB (ref 13.0–17.1)
LYMPH%: 25.8 % (ref 14.0–49.0)
MCH: 30.3 pg (ref 27.2–33.4)
MCHC: 33.8 g/dL (ref 32.0–36.0)
MCV: 89.6 fL (ref 79.3–98.0)
MONO#: 0.5 10*3/uL (ref 0.1–0.9)
MONO%: 11.6 % (ref 0.0–14.0)
NEUT#: 2.3 10*3/uL (ref 1.5–6.5)
NEUT%: 58.6 % (ref 39.0–75.0)
PLATELETS: 139 10*3/uL — AB (ref 140–400)
RBC: 3.88 10*6/uL — AB (ref 4.20–5.82)
RDW: 14.3 % (ref 11.0–14.6)
WBC: 3.9 10*3/uL — AB (ref 4.0–10.3)
lymph#: 1 10*3/uL (ref 0.9–3.3)

## 2015-01-17 NOTE — Telephone Encounter (Signed)
Placed most recent lab work and complete xofigo template on Dr. Charlton Amor desk to review.

## 2015-01-23 ENCOUNTER — Telehealth: Payer: Self-pay | Admitting: *Deleted

## 2015-01-23 NOTE — Telephone Encounter (Signed)
CALLED PATIENT'S DAUGHTER SHERRY ALLOWAY TO REMIND OF XOFIGO INJ. FOR 01-24-15, SPOKE WITH SHERRY AND SHE IS AWARE OF THIS INJ.

## 2015-01-24 ENCOUNTER — Encounter: Payer: Self-pay | Admitting: Radiation Oncology

## 2015-01-24 ENCOUNTER — Ambulatory Visit (HOSPITAL_COMMUNITY)
Admission: RE | Admit: 2015-01-24 | Discharge: 2015-01-24 | Disposition: A | Discharge status: Home / Self Care | Payer: Medicare Other | Source: Ambulatory Visit | Attending: Radiation Oncology | Admitting: Radiation Oncology

## 2015-01-24 DIAGNOSIS — C7951 Secondary malignant neoplasm of bone: Secondary | ICD-10-CM | POA: Diagnosis not present

## 2015-01-24 DIAGNOSIS — C61 Malignant neoplasm of prostate: Secondary | ICD-10-CM

## 2015-01-24 NOTE — Progress Notes (Signed)
CC: Dr. Zola Button, Dr. Franchot Gallo, Dr. Bluford Kaufmann  Follow-up note:  [name] [name] is seen today prior to his third radium 223 infusion.  He is without complaints today.  He denies bone pain other than chronic arthritis.  His blood counts are satisfactory.  Laboratory data: Lab Results  Component Value Date   WBC 3.9* 01/17/2015   HGB 11.8* 01/17/2015   HCT 34.8* 01/17/2015   MCV 89.6 01/17/2015   PLT 139* 01/17/2015   Impression: Satisfactory progress.  Plan: Proceed with third radium 223 infusion.  Next infusion approximately 1 month.  10 minutes was spent face-to-face with the patient, primarily counseling patient and coordinating his care.

## 2015-01-24 NOTE — Progress Notes (Signed)
  Radiation Oncology         ([phone]) [phone] ________________________________  Name: LIEF PALMATIER MRN: [id number]  Date: 01/24/2015  DOB: Aug 08, 1929  Radium-223 Infusion Note  Diagnosis:  Castration resistant prostate cancer with painful bone involvement  Current Infusion:    3  Planned Infusions:  6  Narrative: Mr. TERY HOEGER presented to nuclear medicine for treatment. His most recent blood counts were reviewed.  He remains a good candidate to proceed with Ra-223.  The patient was situated in an infusion suite with a contact barrier placed under his arm. Intravenous access was established, using sterile technique, and a normal saline infusion from a syringe was started.  Micro-dosimetry:  The prescribed radiation activity was assayed and confirmed to be within specified tolerance.  Special Treatment Procedure - Infusion:  The nuclear medicine technologist and I personally verified the dose activity to be delivered as specified in the written directive, and verified the patient identification via 2 separate methods.  The syringe containing the dose was attached to a 3 way stopcock, and then the valve was opened to the patient, and the dose delivered over a minute. No complications were noted.  The total administered dose was 135.90 microcuries in both syringes.  A saline flush of the line and the syringe that contained the isotope was then performed.  The residual radioactivity in both  syringes was 4.35 microcuries, so the actual infused isotope activity was 131.55 microcuries.   Pressure was applied to the venipuncture site, and a compression bandage placed.  Radiation Safety personnel were present to perform the discharge survey, as detailed on their documentation.   After a short period of observation, the patient had his IV removed.  Impression:  The patient tolerated his infusion relatively well.  Plan:  The patient will return in one month for ongoing care.      ------------------------------------------------        Rexene Edison, MD

## 2015-01-25 ENCOUNTER — Telehealth: Payer: Self-pay | Admitting: Internal Medicine

## 2015-01-25 NOTE — Telephone Encounter (Signed)
Patient called to give fasting blood sugar readings:  6/30-227; 7/15-138; 7/16-199; 7/17-170; 7/18-184; 7/19-136; 7/20-152; 7/21-123; 7/22-132; 7/23-177; 7/24-154; 7/25-149; 7/26-125; 7/27-175.

## 2015-01-26 NOTE — Telephone Encounter (Signed)
Spoke to pt, told him Dr. Raliegh Ip would like you to increase Victoza to 1.2 milligrams daily. Pt verbalized understanding.

## 2015-01-26 NOTE — Telephone Encounter (Signed)
Ask patient to increase Victoza to 1.2 milligrams daily

## 2015-01-27 ENCOUNTER — Other Ambulatory Visit: Payer: Self-pay | Admitting: Internal Medicine

## 2015-02-02 ENCOUNTER — Other Ambulatory Visit: Payer: Self-pay | Admitting: Radiation Oncology

## 2015-02-02 ENCOUNTER — Other Ambulatory Visit (HOSPITAL_BASED_OUTPATIENT_CLINIC_OR_DEPARTMENT_OTHER): Payer: Medicare Other

## 2015-02-02 ENCOUNTER — Encounter: Payer: Self-pay | Admitting: Internal Medicine

## 2015-02-02 ENCOUNTER — Ambulatory Visit (HOSPITAL_BASED_OUTPATIENT_CLINIC_OR_DEPARTMENT_OTHER): Payer: Medicare Other | Admitting: Oncology

## 2015-02-02 ENCOUNTER — Ambulatory Visit (INDEPENDENT_AMBULATORY_CARE_PROVIDER_SITE_OTHER): Payer: Medicare Other | Admitting: Internal Medicine

## 2015-02-02 ENCOUNTER — Telehealth: Payer: Self-pay | Admitting: Oncology

## 2015-02-02 VITALS — BP 140/80 | HR 60 | Temp 98.0°F | Wt 200.0 lb

## 2015-02-02 VITALS — BP 132/63 | HR 53 | Temp 98.2°F | Resp 17 | Ht 67.0 in | Wt 199.1 lb

## 2015-02-02 DIAGNOSIS — H6121 Impacted cerumen, right ear: Secondary | ICD-10-CM | POA: Diagnosis not present

## 2015-02-02 DIAGNOSIS — E291 Testicular hypofunction: Secondary | ICD-10-CM

## 2015-02-02 DIAGNOSIS — C7951 Secondary malignant neoplasm of bone: Principal | ICD-10-CM

## 2015-02-02 DIAGNOSIS — E0842 Diabetes mellitus due to underlying condition with diabetic polyneuropathy: Secondary | ICD-10-CM | POA: Diagnosis not present

## 2015-02-02 DIAGNOSIS — R634 Abnormal weight loss: Secondary | ICD-10-CM

## 2015-02-02 DIAGNOSIS — R5383 Other fatigue: Secondary | ICD-10-CM | POA: Diagnosis not present

## 2015-02-02 DIAGNOSIS — C61 Malignant neoplasm of prostate: Secondary | ICD-10-CM

## 2015-02-02 LAB — CBC WITH DIFFERENTIAL/PLATELET
BASO%: 0.8 % (ref 0.0–2.0)
Basophils Absolute: 0 10*3/uL (ref 0.0–0.1)
EOS%: 3.9 % (ref 0.0–7.0)
Eosinophils Absolute: 0.1 10*3/uL (ref 0.0–0.5)
HEMATOCRIT: 33.1 % — AB (ref 38.4–49.9)
HEMOGLOBIN: 11.2 g/dL — AB (ref 13.0–17.1)
LYMPH%: 19.6 % (ref 14.0–49.0)
MCH: 30.7 pg (ref 27.2–33.4)
MCHC: 33.9 g/dL (ref 32.0–36.0)
MCV: 90.6 fL (ref 79.3–98.0)
MONO#: 0.3 10*3/uL (ref 0.1–0.9)
MONO%: 9.7 % (ref 0.0–14.0)
NEUT%: 66 % (ref 39.0–75.0)
NEUTROS ABS: 2.3 10*3/uL (ref 1.5–6.5)
Platelets: 131 10*3/uL — ABNORMAL LOW (ref 140–400)
RBC: 3.65 10*6/uL — ABNORMAL LOW (ref 4.20–5.82)
RDW: 14.9 % — AB (ref 11.0–14.6)
WBC: 3.5 10*3/uL — AB (ref 4.0–10.3)
lymph#: 0.7 10*3/uL — ABNORMAL LOW (ref 0.9–3.3)

## 2015-02-02 LAB — COMPREHENSIVE METABOLIC PANEL (CC13)
ALT: 17 U/L (ref 0–55)
AST: 15 U/L (ref 5–34)
Albumin: 3.6 g/dL (ref 3.5–5.0)
Alkaline Phosphatase: 58 U/L (ref 40–150)
Anion Gap: 5 mEq/L (ref 3–11)
BUN: 20.8 mg/dL (ref 7.0–26.0)
CO2: 25 mEq/L (ref 22–29)
CREATININE: 1.5 mg/dL — AB (ref 0.7–1.3)
Calcium: 9.6 mg/dL (ref 8.4–10.4)
Chloride: 112 mEq/L — ABNORMAL HIGH (ref 98–109)
EGFR: 43 mL/min/{1.73_m2} — ABNORMAL LOW (ref >90)
GLUCOSE: 129 mg/dL (ref 70–140)
Potassium: 4.4 mEq/L (ref 3.5–5.1)
Sodium: 142 mEq/L (ref 136–145)
Total Bilirubin: 0.52 mg/dL (ref 0.20–1.20)
Total Protein: 6.2 g/dL — ABNORMAL LOW (ref 6.4–8.3)

## 2015-02-02 MED ORDER — LIRAGLUTIDE 18 MG/3ML ~~LOC~~ SOPN: 1.8000 mg | PEN_INJECTOR | Freq: Every morning | SUBCUTANEOUS | Status: DC

## 2015-02-02 NOTE — Progress Notes (Signed)
Subjective:    Patient ID: [name] [name], male    DOB: May 23, 1930, 81 y.o.   MRN: [id number]  HPI  81 year old patient who is seen today complaining of mild hearing loss, and foreign body sensation in his right ear.  No ear pain. He has type 2 diabetes which has improved with GLP-1 agonist therapy  Past Medical History  Diagnosis Date  . Hypertension   . Hyperlipidemia   . Memory loss     mild  . Rectal ulcer     RESOLVED  . Constipation   . History of ventricular fibrillation 1998    while on treadmill  . Hypothyroidism   . IBS (irritable bowel syndrome)   . Colitis     RESOLVED  . ED (erectile dysfunction)   . Osteoporosis   . Depression   . Diabetes mellitus     fasting blood sugar 140-200 usually  . GERD (gastroesophageal reflux disease)   . Heart attack 2009  . Dementia   . Neuromuscular disorder     DIABETIC NEUROPATHY  . Prostate cancer 2007  . Pain     LOWER BACK  -- PAST HX OF STEROID EPIDURALS FOR PAIN RELIEF  . 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 CAD (coronary artery disease)     a. S/P CABG x 5 1998; b.  S/P stent LCX 2009;  c.  LHC 5/09: RCA proximal 90%, 95%, distally occluded, pCFX stents patent, OM1 30%, small left atrial branch 95%, oLAD occluded, S-dRCA patent, L-LAD/Dx patent, EF 60%=> Med Rx  ;  d.  Lex MV 3/13:  EF 63%, no ischemia  e.  Lex Myoview 03/08/13:  EF 56%, inf and apical defect suggestive of thinning and soft tissue attenuation, no ischemia; low risk.    . Bone metastases   . S/P radiation therapy 07/17/2005 - 09/10/2005    Prostate 7800 cGy, 40 Sessions  . Cataract     bil eyes    History   Social History  . Marital Status: Widowed    Spouse Name: N/A  . Number of Children: 4  . Years of Education: N/A   Occupational History  . retired    Social History Main Topics  . Smoking status:  Former Research scientist (life sciences)  . Smokeless tobacco: Former Systems developer    Quit date: 02/11/1948  . Alcohol Use: No  . Drug Use: No  . Sexual Activity: Not on file   Other Topics Concern  . Not on file   Social History Narrative   Right handed, Widowed, 4 kids, Caffeine 2 cups daily, occa soda.  Retired, Chief Executive Officer    Past Surgical History  Procedure Laterality Date  . Coronary artery bypass graft  1998    x5, with a left internal mammary to the LAD and diagonal, vein graft to the OM1 and 2, and a vein graft to the right coronary artery  . Coronary stent placement  11/13/2007    LCX  . Trigger finger repaired  2009  . Laparoscopic gastrectomy N/A 08/18/2012    Procedure: Laparoscopic Partial Gastrectomy;  Surgeon: [name], MD;  Location: [facility];  Service: General;  Laterality: N/A;  Laparoscopic Partial Gastrectomy,  . Colonoscopy    . Stomach surgery    . Upper gastrointestinal endoscopy      Family History  Problem Relation Age of Onset  . Stroke Father   .  Pneumonia Father   . Colon cancer Neg Hx   . Esophageal cancer Neg Hx   . Rectal cancer Neg Hx   . Stomach cancer Neg Hx     Allergies  Allergen Reactions  . Codeine Phosphate Other (See Comments)    mouth sores    Current Outpatient Prescriptions on File Prior to Visit  Medication Sig Dispense Refill  . acetaminophen (TYLENOL) 500 MG tablet Take 1,000 mg by mouth every 6 (six) hours as needed for pain.    [name] amLODipine-valsartan (EXFORGE) 10-320 MG per tablet TAKE 1/2 TABLET BY MOUTH EVERY DAY 90 tablet 1  . aspirin EC 81 MG tablet Take 81 mg by mouth every morning.     [name] atorvastatin (LIPITOR) 10 MG tablet TAKE 1 TABLET BY MOUTH EVERY DAY 90 tablet 1  . calcium carbonate-magnesium hydroxide (ROLAIDS) 334 MG CHEW Chew 1 tablet by mouth 3 (three) times daily with meals.     . Calcium Citrate-Vitamin D (CITRACAL + D PO) Take 1 tablet by mouth daily.    . Denosumab (XGEVA [place]) Inject into the skin every 30 (thirty) days. Injects  "around the first part of each month."    . donepezil (ARICEPT) 10 MG tablet TAKE 1 TABLET BY MOUTH ONCE DAILY 90 tablet 0  . glimepiride (AMARYL) 4 MG tablet Take 4 mg by mouth daily with breakfast.   4  . glimepiride (AMARYL) 4 MG tablet TAKE 1 TABLET BY MOUTH DAILY BEFORE BREAKFAST 30 tablet 5  . Glucosamine-Chondroitin (GLUCOSAMINE CHONDR COMPLEX) 500-400 MG CAPS Take by mouth daily.    [name] glucose blood (ACCU-CHEK AVIVA) test strip USE TO CHECK BLOOD SUGAR DAILY AND PRN 100 each 12  . Insulin Pen Needle (CAREFINE PEN NEEDLES) 32G X 4 MM MISC USE TO INJECT INSULIN DAILY 90 each 3  . leuprolide (LUPRON) 3.75 MG injection Inject 3.75 mg into the muscle every 4 (four) months.    . levothyroxine (SYNTHROID, LEVOTHROID) 50 MCG tablet TAKE 1 TABLET BY MOUTH DAILY 90 tablet 1  . Liraglutide 18 MG/3ML SOPN Inject 0.1 mLs (0.6 mg total) into the skin every morning. 6 mL 6  . LYRICA 50 MG capsule TAKE 2 CAPSULES BY MOUTH EVERY MORNING THEN TAKE 1 CAPSULE BY MOUTH EVERY EVENING 90 capsule 5  . metFORMIN (GLUCOPHAGE) 1000 MG tablet Take 1 tablet (1,000 mg total) by mouth daily with breakfast. 180 tablet 1  . NAMENDA XR 28 MG CP24 24 hr capsule TAKE 1 CAPSULE BY MOUTH DAILY 30 capsule 2  . nitroGLYCERIN (NITROSTAT) 0.4 MG SL tablet Place 0.4 mg under the tongue every 5 (five) minutes as needed. For chest pain    . Nutritional Supplements (JUICE PLUS FIBRE PO) Take 6 capsules by mouth daily.     [name] omega-3 acid ethyl esters (LOVAZA) 1 G capsule Take 1 g by mouth once a week.     . pioglitazone (ACTOS) 15 MG tablet TAKE 1 TABLET BY MOUTH DAILY 90 tablet 1  . traMADol (ULTRAM) 50 MG tablet TAKE 1 TABLET BY MOUTH EVERY 8 HOURS AS NEEDED 90 tablet 2  . VESICARE 10 MG tablet Take 5 mg by mouth daily.   11  . [DISCONTINUED] chlorthalidone (HYGROTON) 25 MG tablet Take 12.5 mg by mouth daily.     No current facility-administered medications on file prior to visit.    BP 140/80 mmHg  Pulse 60  Temp(Src) 98 F  (36.7 C) (Oral)  Wt 200 lb (90.719 kg)  SpO2 98%  Review of Systems  Constitutional: Negative for fever, chills, appetite change and fatigue.  HENT: Positive for hearing loss. Negative for congestion, dental problem, ear pain, sore throat, tinnitus, trouble swallowing and voice change.   Eyes: Negative for pain, discharge and visual disturbance.  Respiratory: Negative for cough, chest tightness, wheezing and stridor.   Cardiovascular: Negative for chest pain, palpitations and leg swelling.  Gastrointestinal: Negative for nausea, vomiting, abdominal pain, diarrhea, constipation, blood in stool and abdominal distention.  Genitourinary: Negative for urgency, hematuria, flank pain, discharge, difficulty urinating and genital sores.  Musculoskeletal: Negative for myalgias, back pain, joint swelling, arthralgias, gait problem and neck stiffness.  Skin: Negative for rash.  Neurological: Negative for dizziness, syncope, speech difficulty, weakness, numbness and headaches.  Hematological: Negative for adenopathy. Does not bruise/bleed easily.  Psychiatric/Behavioral: Negative for behavioral problems and dysphoric mood. The patient is not nervous/anxious.        Objective:   Physical Exam  Constitutional: He appears well-developed and well-nourished. No distress.  Blood pressure controlled  HENT:  Cerumen.  Right canal          Assessment & Plan:   Cerumen impaction.  Right canal.  Irrigated until clear Diabetes mellitus, improved

## 2015-02-02 NOTE — Telephone Encounter (Signed)
Pt confirmed labs/ov per 08/04 POF, gave pt avs and calendar.... KJ °

## 2015-02-02 NOTE — Patient Instructions (Signed)
Cerumen Impaction °A cerumen impaction is when the wax in your ear forms a plug. This plug usually causes reduced hearing. Sometimes it also causes an earache or dizziness. Removing a cerumen impaction can be difficult and painful. The wax sticks to the ear canal. The canal is sensitive and bleeds easily. If you try to remove a heavy wax buildup with a cotton tipped swab, you may push it in further. °Irrigation with water, suction, and small ear curettes may be used to clear out the wax. If the impaction is fixed to the skin in the ear canal, ear drops may be needed for a few days to loosen the wax. People who build up a lot of wax frequently can use ear wax removal products available in your local drugstore. °SEEK MEDICAL CARE IF:  °You develop an earache, increased hearing loss, or marked dizziness. °Document Released: 07/25/2004 Document Revised: 09/09/2011 Document Reviewed: 09/14/2009 °ExitCare® Patient Information ©2015 [employer]. This information is not intended to replace advice given to you by your health care provider. Make sure you discuss any questions you have with your health care provider. ° °

## 2015-02-02 NOTE — Progress Notes (Signed)
Hematology and Oncology Follow Up Visit  [name] [name] [id number] [birth year] 81 y.o. 02/02/2015 3:53 PM [name], MDKwiatkowski, [name], MD   Principle Diagnosis: This is an 81 year old gentleman with castration resistant prostate cancer metastatic disease to the bone. His initial diagnosis was in 2006 when he presented with a PSA of 6.26 the Gleason score 4+3 equals 7.  Prior Therapy:  He is status post definitive therapy with radiation and androgen deprivation completed in 2007. The patient developed recurrent disease in 2012 with a PSA rise up to 28 and T9 osseous metastasis and was treated with combined androgen deprivation with Lupron and Casodex. He had an excellent response with his PSA nadir down to 0.19. Most recently he developed a rise in his PSA up to 3.06 in July 2014 despite castrate levels of testosterone. His bone scan in July 2014 confirmed the presence of bony disease. Zytiga 1000 mg daily started in July 2014 till July 2015.  Xtandi 160 mg daily started in July of 2015 to April 2016.   Current therapy:  Xofigo started on 11/03/2014. He is status post 3 injections out of 6. He receives [place] monthly at [facility] urology. He receives Lupron every 4 months at [facility]rology.  Interim History:  [name] returns today for a followup visit accompanied by his daughter. Since the last visit, he continues to do well without any new complications. He received three injections of Xofigo without any complications. He did report some fatigue but no other decline. He continues to have diffuse bony pain predominantly shoulder, hips and knees which is predominantly arthritic in nature. He reports he is eating well.  His mobility have not changed and has not reported any syncope or falls. His quality of life and performance status have not changed.   He does not report any lower extremity edema or difficulty breathing. No abdominal pain, nausea, vomiting. His performance  status is unchanged. Has not reported any of the fluid retention or any bone pain.  He does not report any headaches or blurry vision. He does not report any syncope or falls. He does not report chest pain, shortness of breath or difficulty breathing. Did not report any genitourinary bleeding or hemoptysis. Remainder of his review of systems unremarkable.  Medications: I have reviewed the patient's current medications.  Current Outpatient Prescriptions  Medication Sig Dispense Refill  . acetaminophen (TYLENOL) 500 MG tablet Take 1,000 mg by mouth every 6 (six) hours as needed for pain.    [name] amLODipine-valsartan (EXFORGE) 10-320 MG per tablet TAKE 1/2 TABLET BY MOUTH EVERY DAY 90 tablet 1  . aspirin EC 81 MG tablet Take 81 mg by mouth every morning.     [name] atorvastatin (LIPITOR) 10 MG tablet TAKE 1 TABLET BY MOUTH EVERY DAY 90 tablet 1  . calcium carbonate-magnesium hydroxide (ROLAIDS) 334 MG CHEW Chew 1 tablet by mouth 3 (three) times daily with meals.     . Calcium Citrate-Vitamin D (CITRACAL + D PO) Take 1 tablet by mouth daily.    . Denosumab (XGEVA [place]) Inject into the skin every 30 (thirty) days. Injects "around the first part of each month."    . donepezil (ARICEPT) 10 MG tablet TAKE 1 TABLET BY MOUTH ONCE DAILY 90 tablet 0  . glimepiride (AMARYL) 4 MG tablet TAKE 1 TABLET BY MOUTH DAILY BEFORE BREAKFAST 30 tablet 5  . Glucosamine-Chondroitin (GLUCOSAMINE CHONDR COMPLEX) 500-400 MG CAPS Take by mouth daily.    [name] glucose blood (ACCU-CHEK AVIVA) test strip  USE TO CHECK BLOOD SUGAR DAILY AND PRN 100 each 12  . Insulin Pen Needle (CAREFINE PEN NEEDLES) 32G X 4 MM MISC USE TO INJECT INSULIN DAILY 90 each 3  . leuprolide (LUPRON) 3.75 MG injection Inject 3.75 mg into the muscle every 4 (four) months.    . levothyroxine (SYNTHROID, LEVOTHROID) 50 MCG tablet TAKE 1 TABLET BY MOUTH DAILY 90 tablet 1  . Liraglutide 18 MG/3ML SOPN Inject 0.3 mLs (1.8 mg total) into the skin every morning. 6 mL 6  .  LYRICA 50 MG capsule TAKE 2 CAPSULES BY MOUTH EVERY MORNING THEN TAKE 1 CAPSULE BY MOUTH EVERY EVENING 90 capsule 5  . metFORMIN (GLUCOPHAGE) 1000 MG tablet Take 1 tablet (1,000 mg total) by mouth daily with breakfast. 180 tablet 1  . NAMENDA XR 28 MG CP24 24 hr capsule TAKE 1 CAPSULE BY MOUTH DAILY 30 capsule 2  . nitroGLYCERIN (NITROSTAT) 0.4 MG SL tablet Place 0.4 mg under the tongue every 5 (five) minutes as needed. For chest pain    . Nutritional Supplements (JUICE PLUS FIBRE PO) Take 6 capsules by mouth daily.     [name] omega-3 acid ethyl esters (LOVAZA) 1 G capsule Take 1 g by mouth once a week.     . pioglitazone (ACTOS) 15 MG tablet TAKE 1 TABLET BY MOUTH DAILY 90 tablet 1  . traMADol (ULTRAM) 50 MG tablet TAKE 1 TABLET BY MOUTH EVERY 8 HOURS AS NEEDED 90 tablet 2  . VESICARE 10 MG tablet Take 5 mg by mouth daily.   11  . [DISCONTINUED] chlorthalidone (HYGROTON) 25 MG tablet Take 12.5 mg by mouth daily.     No current facility-administered medications for this visit.     Allergies:  Allergies  Allergen Reactions  . Codeine Phosphate Other (See Comments)    mouth sores    Past Medical History, Surgical history, Social history, and Family History were reviewed and updated.    Physical Exam: Blood pressure 132/63, pulse 53, temperature 98.2 F (36.8 C), temperature source Oral, resp. rate 17, height 5\' 7"  (1.702 m), weight 199 lb 1.6 oz (90.311 kg), SpO2 99 %. ECOG: 1 General appearance: alert and cooperative elderly gentleman not in any distress. Head: Normocephalic, without obvious abnormality Neck: no adenopathy Lymph nodes: Cervical, supraclavicular, and axillary nodes normal. Heart:regular rate and rhythm, S1, S2 normal, no murmur, click, rub or gallop Lung:chest clear, no wheezing, rales.  Abdomen: soft, non-tender, without masses or organomegaly. No rebound or guarding. EXT:no erythema, induration, or nodules. Trace edema noted. Neurological examination: No  deficits.  Lab Results: Lab Results  Component Value Date   WBC 3.5* 02/02/2015   HGB 11.2* 02/02/2015   HCT 33.1* 02/02/2015   MCV 90.6 02/02/2015   PLT 131* 02/02/2015     Chemistry      Component Value Date/Time   NA 139 12/29/2014 1329   NA 137 02/22/2014 1026   K 4.3 12/29/2014 1329   K 4.3 02/22/2014 1026   CL 101 02/22/2014 1026   CO2 24 12/29/2014 1329   CO2 26 02/22/2014 1026   BUN 30.4* 12/29/2014 1329   BUN 27* 02/22/2014 1026   CREATININE 1.8* 12/29/2014 1329   CREATININE 1.5 02/22/2014 1026   CREATININE 1.38* 02/11/2012 1614      Component Value Date/Time   CALCIUM 9.9 12/29/2014 1329   CALCIUM 10.1 02/22/2014 1026   ALKPHOS 79 12/29/2014 1329   ALKPHOS 54 05/07/2013 1522   AST 13 12/29/2014 1329   AST 22  05/07/2013 1522   ALT 14 12/29/2014 1329   ALT 20 05/07/2013 1522   BILITOT 0.50 12/29/2014 1329   BILITOT 0.7 05/07/2013 1522     Lab Results  Component Value Date   PSA 16.51* 12/29/2014   PSA 22.72* 11/08/2014   PSA 16.06* 09/28/2014     Impression and Plan: This is an 81 year old gentleman with the following issues: 1. 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 His initial diagnosis dates back to 2006 with PSA of 6.26 and a Gleason score of 7. He progressed on [place].   He is currently on Xofigo and have tolerated it well. After 2 treatments, his PSA dropped down from 22-16. His alkaline phosphatase also have dropped which indicate potential response to this therapy. The plan is to continue a full course before making any adjustments. He is scheduled for the fourth out of the 6 injection later on this month.  2. Bony disease: He is receiving Xgeva at [facility] urology on a monthly basis.  3. Hormonal deprivation: He is receiving Lupron at [facility] urology every 4 months.  4. Fatigue: Unchanged dramatically at this time.  5. Weight loss: His weight appeared to be stable at this time.  6. Followup: He will  return 4 weeks for an evaluation.  [facility], MD 8/4/20163:53 PM

## 2015-02-03 ENCOUNTER — Telehealth: Payer: Self-pay | Admitting: *Deleted

## 2015-02-03 LAB — PSA: PSA: 12.38 ng/mL — ABNORMAL HIGH (ref <=4.00)

## 2015-02-03 NOTE — Telephone Encounter (Signed)
-----   Message from Wyatt Portela, MD sent at 02/03/2015  9:49 AM EDT ----- Please call his PSA

## 2015-02-03 NOTE — Telephone Encounter (Signed)
As noted below by Dr. Alen Blew, I informed [name], patient's daughter, that his PSA level was 12.3. [name] verbalized understanding.

## 2015-02-03 NOTE — Telephone Encounter (Signed)
CALLED PATIENT TO INFORM OF LAB , AND XOFIGO  INJ., LVM FOR A RETURN CALL

## 2015-02-10 ENCOUNTER — Other Ambulatory Visit: Payer: Self-pay | Admitting: Internal Medicine

## 2015-02-14 ENCOUNTER — Encounter: Payer: Self-pay | Admitting: Gastroenterology

## 2015-02-15 ENCOUNTER — Other Ambulatory Visit: Payer: Self-pay | Admitting: *Deleted

## 2015-02-15 ENCOUNTER — Other Ambulatory Visit: Payer: Self-pay | Admitting: Oncology

## 2015-02-15 ENCOUNTER — Ambulatory Visit
Admission: RE | Admit: 2015-02-15 | Discharge: 2015-02-15 | Disposition: A | Discharge status: Home / Self Care | Payer: Medicare Other | Source: Ambulatory Visit | Attending: Radiation Oncology | Admitting: Radiation Oncology

## 2015-02-15 DIAGNOSIS — Z8546 Personal history of malignant neoplasm of prostate: Secondary | ICD-10-CM

## 2015-02-15 DIAGNOSIS — C61 Malignant neoplasm of prostate: Secondary | ICD-10-CM | POA: Diagnosis present

## 2015-02-15 LAB — CBC WITH DIFFERENTIAL/PLATELET
BASO%: 0.7 % (ref 0.0–2.0)
Basophils Absolute: 0 10*3/uL (ref 0.0–0.1)
EOS%: 3 % (ref 0.0–7.0)
Eosinophils Absolute: 0.1 10*3/uL (ref 0.0–0.5)
HCT: 34.2 % — ABNORMAL LOW (ref 38.4–49.9)
HEMOGLOBIN: 11.6 g/dL — AB (ref 13.0–17.1)
LYMPH#: 0.8 10*3/uL — AB (ref 0.9–3.3)
LYMPH%: 23.6 % (ref 14.0–49.0)
MCH: 30.9 pg (ref 27.2–33.4)
MCHC: 33.9 g/dL (ref 32.0–36.0)
MCV: 91 fL (ref 79.3–98.0)
MONO#: 0.4 10*3/uL (ref 0.1–0.9)
MONO%: 11.7 % (ref 0.0–14.0)
NEUT%: 61 % (ref 39.0–75.0)
NEUTROS ABS: 2.2 10*3/uL (ref 1.5–6.5)
PLATELETS: 142 10*3/uL (ref 140–400)
RBC: 3.76 10*6/uL — ABNORMAL LOW (ref 4.20–5.82)
RDW: 14.9 % — AB (ref 11.0–14.6)
WBC: 3.6 10*3/uL — ABNORMAL LOW (ref 4.0–10.3)

## 2015-02-16 ENCOUNTER — Encounter: Payer: Self-pay | Admitting: Radiation Oncology

## 2015-02-16 LAB — PSA: PSA: 12.24 ng/mL — ABNORMAL HIGH (ref <=4.00)

## 2015-02-16 NOTE — Progress Notes (Signed)
Placed 02/15/15 lab work in Dr. Charlton Amor inbox to review prior to injection on 02/22/2015

## 2015-02-21 ENCOUNTER — Telehealth: Payer: Self-pay | Admitting: *Deleted

## 2015-02-21 NOTE — Telephone Encounter (Signed)
CALLED PATIENT TO REMIND OF [place] INJECTION FOR 02-22-15 @ 11:30 AM IN [facility], SPOKE WITH DAUGHTER- SHERRY ALLOWAY AND SHE IS AWARE OF THIS INJ.

## 2015-02-22 ENCOUNTER — Encounter: Payer: Self-pay | Admitting: Radiation Oncology

## 2015-02-22 ENCOUNTER — Encounter (HOSPITAL_COMMUNITY)
Admission: RE | Admit: 2015-02-22 | Discharge: 2015-02-22 | Disposition: A | Discharge status: Home / Self Care | Payer: Medicare Other | Source: Ambulatory Visit | Attending: Radiation Oncology | Admitting: Radiation Oncology

## 2015-02-22 DIAGNOSIS — C7951 Secondary malignant neoplasm of bone: Secondary | ICD-10-CM | POA: Insufficient documentation

## 2015-02-22 DIAGNOSIS — C61 Malignant neoplasm of prostate: Secondary | ICD-10-CM | POA: Insufficient documentation

## 2015-02-22 NOTE — Progress Notes (Signed)
  Radiation Oncology         [phone] ________________________________  Name: [name] [name] MRN: [id number]  Date: 02/22/2015  DOB: 06-16-1930  Radium-223 Infusion Note  Diagnosis:  Castration resistant prostate cancer with painful bone involvement  Current Infusion:    4  Planned Infusions:  6  Narrative: [name] [name] presented to nuclear medicine for treatment. His most recent blood counts were reviewed.  He remains a good candidate to proceed with Ra-223.  The patient was situated in an infusion suite with a contact barrier placed under his arm. Intravenous access was established, using sterile technique, and a normal saline infusion from a syringe was started.  Micro-dosimetry:  The prescribed radiation activity was assayed and confirmed to be within specified tolerance.  Special Treatment Procedure - Infusion:  The nuclear medicine technologist and I personally verified the dose activity to be delivered as specified in the written directive, and verified the patient identification via 2 separate methods.  The syringe containing the dose was attached to a 3 way stopcock, and then the valve was opened to the patient, and the dose delivered over a minute. No complications were noted.  The total administered dose was 134.1 microcuries in a volume of 6.85 cc.  A saline flush of the line and the syringe that contained the isotope was then performed.  The residual radioactivity in the syringe was 2.95 microcuries, so the actual infused isotope activity was 131.15 microcuries.   Pressure was applied to the venipuncture site, and a compression bandage placed.  Radiation Safety personnel were present to perform the discharge survey, as detailed on their documentation.   After a short period of observation, the patient had his IV removed.  Impression:  The patient tolerated his infusion relatively well.  Plan:  The patient will return in one month for ongoing care.      ------------------------------------------------        Rexene Edison, MD

## 2015-02-22 NOTE — Progress Notes (Addendum)
CC: Dr. Zola Button, Dr. Franchot Gallo, Dr. Baron Sane       Follow-up note:  [name] [name] seen today prior to his fourth radium 223 infusion.  He is without complaints today.  He denies bone pain.  His blood counts are satisfactory.  Laboratory data: CBC remarkable for white blood count 3.6K, hemoglobin 11.6, platelet count 142K  Impression: Satisfactory progress.  Plan: We'll proceed with his fourth radium 223 infusion.  Next infusion in approximately one month.  10 minutes was spent face-to-face the patient, primarily counseling patient and coordinating his care.

## 2015-02-22 NOTE — Progress Notes (Deleted)
CC: Dr. Zola Button, Dr. Franchot Gallo, Dr. Bluford Kaufmann  Follow-up note:  [name] [name] is seen today prior to his forth radium 223 infusion. He is without complaints today. He denies bone pain other than chronic arthritis. His blood counts are satisfactory.  Laboratory data:  Recent Labs       Lab Results  Component Value Date   WBC 3.6* 02/15/2015   HGB 11.6* 02/15/2015   HCT 34.2* 02/15/2015   MCV 91.0 02/15/2015   PLT 142 02/15/2015    Impression: Satisfactory progress.  Plan: Proceed with fourth radium 223 infusion. Next infusion approximately 1 month.  10 minutes was spent face-to-face with the patient, primarily counseling patient and coordinating his care.

## 2015-02-23 ENCOUNTER — Ambulatory Visit (HOSPITAL_COMMUNITY): Payer: Medicare Other

## 2015-03-02 ENCOUNTER — Other Ambulatory Visit: Payer: Self-pay | Admitting: Oncology

## 2015-03-02 ENCOUNTER — Telehealth: Payer: Self-pay | Admitting: Oncology

## 2015-03-02 ENCOUNTER — Ambulatory Visit (HOSPITAL_BASED_OUTPATIENT_CLINIC_OR_DEPARTMENT_OTHER): Payer: Medicare Other | Admitting: Oncology

## 2015-03-02 ENCOUNTER — Other Ambulatory Visit (HOSPITAL_BASED_OUTPATIENT_CLINIC_OR_DEPARTMENT_OTHER): Payer: Medicare Other

## 2015-03-02 VITALS — BP 148/62 | HR 60 | Temp 98.1°F | Resp 18 | Ht 67.0 in | Wt 199.1 lb

## 2015-03-02 DIAGNOSIS — C61 Malignant neoplasm of prostate: Secondary | ICD-10-CM

## 2015-03-02 DIAGNOSIS — K219 Gastro-esophageal reflux disease without esophagitis: Secondary | ICD-10-CM

## 2015-03-02 DIAGNOSIS — C7951 Secondary malignant neoplasm of bone: Secondary | ICD-10-CM | POA: Diagnosis not present

## 2015-03-02 DIAGNOSIS — E291 Testicular hypofunction: Secondary | ICD-10-CM | POA: Diagnosis not present

## 2015-03-02 DIAGNOSIS — L989 Disorder of the skin and subcutaneous tissue, unspecified: Secondary | ICD-10-CM

## 2015-03-02 DIAGNOSIS — R1013 Epigastric pain: Secondary | ICD-10-CM

## 2015-03-02 LAB — CBC WITH DIFFERENTIAL/PLATELET
BASO%: 0.3 % (ref 0.0–2.0)
Basophils Absolute: 0 10*3/uL (ref 0.0–0.1)
EOS ABS: 0.1 10*3/uL (ref 0.0–0.5)
EOS%: 4.1 % (ref 0.0–7.0)
HEMATOCRIT: 33.3 % — AB (ref 38.4–49.9)
HEMOGLOBIN: 11.4 g/dL — AB (ref 13.0–17.1)
LYMPH#: 0.7 10*3/uL — AB (ref 0.9–3.3)
LYMPH%: 21.7 % (ref 14.0–49.0)
MCH: 31.6 pg (ref 27.2–33.4)
MCHC: 34.2 g/dL (ref 32.0–36.0)
MCV: 92.2 fL (ref 79.3–98.0)
MONO#: 0.3 10*3/uL (ref 0.1–0.9)
MONO%: 8.6 % (ref 0.0–14.0)
NEUT#: 2.1 10*3/uL (ref 1.5–6.5)
NEUT%: 65.3 % (ref 39.0–75.0)
PLATELETS: 134 10*3/uL — AB (ref 140–400)
RBC: 3.61 10*6/uL — ABNORMAL LOW (ref 4.20–5.82)
RDW: 14.8 % — ABNORMAL HIGH (ref 11.0–14.6)
WBC: 3.1 10*3/uL — AB (ref 4.0–10.3)

## 2015-03-02 LAB — COMPREHENSIVE METABOLIC PANEL (CC13)
ALBUMIN: 3.9 g/dL (ref 3.5–5.0)
ALK PHOS: 63 U/L (ref 40–150)
ALT: 16 U/L (ref 0–55)
ANION GAP: 8 meq/L (ref 3–11)
AST: 15 U/L (ref 5–34)
BILIRUBIN TOTAL: 0.61 mg/dL (ref 0.20–1.20)
BUN: 22.4 mg/dL (ref 7.0–26.0)
CO2: 28 mEq/L (ref 22–29)
Calcium: 10.8 mg/dL — ABNORMAL HIGH (ref 8.4–10.4)
Chloride: 105 mEq/L (ref 98–109)
Creatinine: 1.8 mg/dL — ABNORMAL HIGH (ref 0.7–1.3)
EGFR: 34 mL/min/{1.73_m2} — ABNORMAL LOW (ref >90)
Glucose: 246 mg/dl — ABNORMAL HIGH (ref 70–140)
Potassium: 4.1 mEq/L (ref 3.5–5.1)
Sodium: 141 mEq/L (ref 136–145)
TOTAL PROTEIN: 7 g/dL (ref 6.4–8.3)

## 2015-03-02 MED ORDER — OMEPRAZOLE 20 MG PO CPDR: 20.0000 mg | DELAYED_RELEASE_CAPSULE | Freq: Every day | ORAL | Status: DC

## 2015-03-02 NOTE — Progress Notes (Signed)
Hematology and Oncology Follow Up Visit  [name] [id number] [birth year] 81 y.o. 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 His initial diagnosis was in 2006 when he presented with a PSA of 6.26 the Gleason score 4+3 equals 7.  Prior Therapy:  He is status post definitive therapy with radiation and androgen deprivation completed in 2007. The patient developed recurrent disease in 2012 with a PSA rise up to 28 and T9 osseous metastasis and was treated with combined androgen deprivation with Lupron and Casodex. He had an excellent response with his PSA nadir down to 0.19. Most recently he developed a rise in his PSA up to 3.06 in July 2014 despite castrate levels of testosterone. His bone scan in July 2014 confirmed the presence of bony disease. Zytiga 1000 mg daily started in July 2014 till July 2015.  Xtandi 160 mg daily started in July of 2015 to April 2016.   Current therapy:  Xofigo started on 11/03/2014. He is status post 4 injections out of 6. He receives Niger monthly at [facility] urology. He receives Lupron every 4 months at [facility].  Interim History:  [name] [name] returns today for a followup visit. Since the last visit, he reports symptoms of acid reflux and dyspepsia. He has been using over-the-counter antiacids without any major benefit. He continues to do well with Xofigo without any complications. He did report some fatigue but no other decline. He continues to have diffuse bony pain predominantly shoulder, hips and knees which is predominantly arthritic in nature and have not changed. He reports he is eating well.  His mobility have not changed and has not reported any syncope or falls. His quality of life and performance status have not changed.  He does report a skin lesion on his forehead I will be evaluated by  dermatology soon.  He does not report any lower extremity edema or difficulty breathing. No abdominal pain, nausea, vomiting. His performance status is unchanged. Has not reported any of the fluid retention or any bone pain.  He does not report any headaches or blurry vision. He does not report any syncope or falls. He does not report chest pain, shortness of breath or difficulty breathing. Did not report any genitourinary bleeding or hemoptysis. Remainder of his review of systems unremarkable.  Medications: I have reviewed the patient's current medications.  Current Outpatient Prescriptions  Medication Sig Dispense Refill  . acetaminophen (TYLENOL) 500 MG tablet Take 1,000 mg by mouth every 6 (six) hours as needed for pain.    [name] amLODipine-valsartan (EXFORGE) 10-320 MG per tablet TAKE 1/2 TABLET BY MOUTH EVERY DAY 90 tablet 1  . aspirin EC 81 MG tablet Take 81 mg by mouth every morning.     [name] atorvastatin (LIPITOR) 10 MG tablet TAKE 1 TABLET BY MOUTH EVERY DAY 90 tablet 1  . calcium carbonate-magnesium hydroxide (ROLAIDS) 334 MG CHEW Chew 1 tablet by mouth 3 (three) times daily with meals.     . Calcium Citrate-Vitamin D (CITRACAL + D PO) Take 1 tablet by mouth daily.    . Denosumab (XGEVA [place]) Inject into the skin every 30 (thirty) days. Injects "around the first part of each month."    . donepezil (ARICEPT) 10 MG tablet TAKE 1 TABLET BY MOUTH DAILY 90 tablet 3  . glimepiride (AMARYL) 4 MG tablet TAKE 1 TABLET BY MOUTH DAILY BEFORE BREAKFAST 30  tablet 5  . Glucosamine-Chondroitin (GLUCOSAMINE CHONDR COMPLEX) 500-400 MG CAPS Take by mouth daily.    [name] glucose blood (ACCU-CHEK AVIVA) test strip USE TO CHECK BLOOD SUGAR DAILY AND PRN 100 each 12  . Insulin Pen Needle (CAREFINE PEN NEEDLES) 32G X 4 MM MISC USE TO INJECT INSULIN DAILY 90 each 3  . leuprolide (LUPRON) 3.75 MG injection Inject 3.75 mg into the muscle every 4 (four) months.    . levothyroxine (SYNTHROID, LEVOTHROID) 50 MCG tablet TAKE 1  TABLET BY MOUTH DAILY 90 tablet 1  . Liraglutide 18 MG/3ML SOPN Inject 0.3 mLs (1.8 mg total) into the skin every morning. 6 mL 6  . LYRICA 50 MG capsule TAKE 2 CAPSULES BY MOUTH EVERY MORNING THEN TAKE 1 CAPSULE BY MOUTH EVERY EVENING 90 capsule 5  . metFORMIN (GLUCOPHAGE) 1000 MG tablet Take 1 tablet (1,000 mg total) by mouth daily with breakfast. 180 tablet 1  . NAMENDA XR 28 MG CP24 24 hr capsule TAKE 1 CAPSULE BY MOUTH DAILY 30 capsule 2  . nitroGLYCERIN (NITROSTAT) 0.4 MG SL tablet Place 0.4 mg under the tongue every 5 (five) minutes as needed. For chest pain    . Nutritional Supplements (JUICE PLUS FIBRE PO) Take 6 capsules by mouth daily.     [name] omega-3 acid ethyl esters (LOVAZA) 1 G capsule Take 1 g by mouth once a week.     . pioglitazone (ACTOS) 15 MG tablet TAKE 1 TABLET BY MOUTH DAILY 90 tablet 1  . VESICARE 10 MG tablet Take 5 mg by mouth daily.   11  . omeprazole (PRILOSEC) 20 MG capsule Take 1 capsule (20 mg total) by mouth daily. 30 capsule 0  . traMADol (ULTRAM) 50 MG tablet TAKE 1 TABLET BY MOUTH EVERY 8 HOURS AS NEEDED 90 tablet 2  . [DISCONTINUED] chlorthalidone (HYGROTON) 25 MG tablet Take 12.5 mg by mouth daily.     No current facility-administered medications for this visit.     Allergies:  Allergies  Allergen Reactions  . Codeine Phosphate Other (See Comments)    mouth sores    Past Medical History, Surgical history, Social history, and Family History were reviewed and updated.    Physical Exam: Blood pressure 148/62, pulse 60, temperature 98.1 F (36.7 C), temperature source Oral, resp. rate 18, height 5\' 7"  (1.702 m), weight 199 lb 1.6 oz (90.311 kg), SpO2 99 %. ECOG: 1 General appearance: alert and cooperative elderly gentleman  Well appearing. Head: Normocephalic, without obvious abnormality Neck: no adenopathy Lymph nodes: Cervical, supraclavicular, and axillary nodes normal. Heart:regular rate and rhythm, S1, S2 normal, no murmur, click, rub or  gallop Lung:chest clear, no wheezing, rales.  Abdomen: soft, non-tender, without masses or organomegaly.  No shifting dullness. EXT:no erythema, induration, or nodules. Trace edema noted. Neurological examination: No deficits. Skin: erythematous lesion and noted on the right aspect of his forehead.  Lab Results: Lab Results  Component Value Date   WBC 3.1* 03/02/2015   HGB 11.4* 03/02/2015   HCT 33.3* 03/02/2015   MCV 92.2 03/02/2015   PLT 134* 03/02/2015     Chemistry      Component Value Date/Time   NA 142 02/02/2015 1522   NA 137 02/22/2014 1026   K 4.4 02/02/2015 1522   K 4.3 02/22/2014 1026   CL 101 02/22/2014 1026   CO2 25 02/02/2015 1522   CO2 26 02/22/2014 1026   BUN 20.8 02/02/2015 1522   BUN 27* 02/22/2014 1026   CREATININE 1.5* 02/02/2015  1522   CREATININE 1.5 02/22/2014 1026   CREATININE 1.38* 02/11/2012 1614      Component Value Date/Time   CALCIUM 9.6 02/02/2015 1522   CALCIUM 10.1 02/22/2014 1026   ALKPHOS 58 02/02/2015 1522   ALKPHOS 54 05/07/2013 1522   AST 15 02/02/2015 1522   AST 22 05/07/2013 1522   ALT 17 02/02/2015 1522   ALT 20 05/07/2013 1522   BILITOT 0.52 02/02/2015 1522   BILITOT 0.7 05/07/2013 1522     Lab Results  Component Value Date   PSA 12.24* 02/15/2015   PSA 12.38* 02/02/2015   PSA 16.51* 12/29/2014     Impression and Plan: This is an 81 year old gentleman with the following issues: 1. 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 His initial diagnosis dates back to 2006 with PSA of 6.26 and a Gleason score of 7. He progressed on [place].   He is currently on Xofigo and have tolerated it well. After 3 treatments, his PSA dropped down from 22 to 12.  His alkaline phosphatase also have dropped which indicate potential response to this therapy. The plan is to continue a full course before making any adjustments. He is scheduled for the fifth out of the 6 injection in the near future.   2. Bony  disease: He is receiving Xgeva at [facility] urology on a monthly basis.  3. Hormonal deprivation: He is receiving Lupron at [facility] urology every 4 months.  4.  Reflux symptoms: I gave a prescription for Prilosec with instructions to use it.  5. Weight loss: His weight appeared to be stable at this time.  6.  Skin lesions: he has follow up with dermatology to have this lesion removed.  7. Followup: He will return 4 weeks for an evaluation.  Zola Button, MD 9/1/20162:52 PM

## 2015-03-02 NOTE — Telephone Encounter (Signed)
Gave and printed appt sched and avs fo rpt for OCT °

## 2015-03-03 LAB — PSA: PSA: 15.92 ng/mL — ABNORMAL HIGH (ref <=4.00)

## 2015-03-08 ENCOUNTER — Other Ambulatory Visit: Payer: Self-pay | Admitting: Radiation Oncology

## 2015-03-08 DIAGNOSIS — C61 Malignant neoplasm of prostate: Secondary | ICD-10-CM

## 2015-03-08 DIAGNOSIS — C7951 Secondary malignant neoplasm of bone: Principal | ICD-10-CM

## 2015-03-09 ENCOUNTER — Telehealth: Payer: Self-pay | Admitting: *Deleted

## 2015-03-09 ENCOUNTER — Inpatient Hospital Stay
Admission: RE | Admit: 2015-03-09 | Discharge: 2015-03-09 | Disposition: A | Discharge status: Home / Self Care | Payer: Medicare Other | Source: Ambulatory Visit | Attending: Radiation Oncology | Admitting: Radiation Oncology

## 2015-03-09 ENCOUNTER — Ambulatory Visit: Payer: Medicare Other | Attending: Radiation Oncology

## 2015-03-09 NOTE — Telephone Encounter (Signed)
CALLED PATIENT'S DAUGHTER - SHERRY ALLOWAY TO INFORM OF LAB AND XOFIGO INJ. , SPOKE WITH SHERRY ALLOWAY AND SHE IS AWARE OF THESE APPTS.

## 2015-03-12 ENCOUNTER — Other Ambulatory Visit: Payer: Self-pay | Admitting: Radiation Oncology

## 2015-03-12 DIAGNOSIS — C7951 Secondary malignant neoplasm of bone: Secondary | ICD-10-CM

## 2015-03-15 ENCOUNTER — Encounter: Payer: Self-pay | Admitting: *Deleted

## 2015-03-15 ENCOUNTER — Ambulatory Visit
Admission: RE | Admit: 2015-03-15 | Discharge: 2015-03-15 | Disposition: A | Discharge status: Home / Self Care | Payer: Medicare Other | Source: Ambulatory Visit | Attending: Radiation Oncology | Admitting: Radiation Oncology

## 2015-03-15 DIAGNOSIS — C7951 Secondary malignant neoplasm of bone: Secondary | ICD-10-CM | POA: Diagnosis present

## 2015-03-15 LAB — CBC WITH DIFFERENTIAL/PLATELET
BASO%: 0.7 % (ref 0.0–2.0)
Basophils Absolute: 0 10*3/uL (ref 0.0–0.1)
EOS%: 2.8 % (ref 0.0–7.0)
Eosinophils Absolute: 0.1 10*3/uL (ref 0.0–0.5)
HCT: 31.5 % — ABNORMAL LOW (ref 38.4–49.9)
HGB: 10.7 g/dL — ABNORMAL LOW (ref 13.0–17.1)
LYMPH%: 16.5 % (ref 14.0–49.0)
MCH: 31.5 pg (ref 27.2–33.4)
MCHC: 34 g/dL (ref 32.0–36.0)
MCV: 92.7 fL (ref 79.3–98.0)
MONO#: 0.4 10*3/uL (ref 0.1–0.9)
MONO%: 10.1 % (ref 0.0–14.0)
NEUT%: 69.9 % (ref 39.0–75.0)
NEUTROS ABS: 2.6 10*3/uL (ref 1.5–6.5)
PLATELETS: 155 10*3/uL (ref 140–400)
RBC: 3.4 10*6/uL — AB (ref 4.20–5.82)
RDW: 15.3 % — ABNORMAL HIGH (ref 11.0–14.6)
WBC: 3.8 10*3/uL — AB (ref 4.0–10.3)
lymph#: 0.6 10*3/uL — ABNORMAL LOW (ref 0.9–3.3)

## 2015-03-15 NOTE — Progress Notes (Signed)
Mr. Pumphrey obtained his CBC/ Diff today with a weight of 198.2 lbs in preparation for his Xofigo injection on 03/22/15.

## 2015-03-16 ENCOUNTER — Other Ambulatory Visit: Payer: Self-pay | Admitting: Internal Medicine

## 2015-03-21 ENCOUNTER — Telehealth: Payer: Self-pay | Admitting: *Deleted

## 2015-03-21 NOTE — Telephone Encounter (Signed)
CALLED PATIENT TO REMIND OF XOFIGO INJ. FOR 03-22-15 - ARRIVAL TIME - 11:15 AM, SPOKE WITH PATIENT'S DAUGHTER- SHERRY ALLOWAY AND SHE IS AWARE OF THIS INJ.

## 2015-03-22 ENCOUNTER — Encounter: Payer: Self-pay | Admitting: Radiation Oncology

## 2015-03-22 ENCOUNTER — Ambulatory Visit (HOSPITAL_COMMUNITY)
Admission: RE | Admit: 2015-03-22 | Discharge: 2015-03-22 | Disposition: A | Discharge status: Home / Self Care | Payer: Medicare Other | Source: Ambulatory Visit | Attending: Radiation Oncology | Admitting: Radiation Oncology

## 2015-03-22 DIAGNOSIS — C61 Malignant neoplasm of prostate: Secondary | ICD-10-CM | POA: Diagnosis present

## 2015-03-22 DIAGNOSIS — C7951 Secondary malignant neoplasm of bone: Secondary | ICD-10-CM | POA: Diagnosis not present

## 2015-03-22 NOTE — Progress Notes (Signed)
CC: Dr. Zola Button, Dr. Franchot Gallo, Dr. Bluford Kaufmann  Follow-up note:  Mr. Schnitker was seen today prior to his fourth radium 223 infusion.  He was without complaints today except for mild fatigue.  He denies bone pain.  His blood counts last week were satisfactory.  Laboratory data: Lab Results  Component Value Date   WBC 3.8* 03/15/2015   HGB 10.7* 03/15/2015   HCT 31.5* 03/15/2015   MCV 92.7 03/15/2015   PLT 155 03/15/2015    Impression: Satisfactory progress.  Plan: We will proceed with his sixth and final radium 223 infusion in one month.  10 minutes was spent face-to-face with the patient, primarily counseling patient and coordinating his care.

## 2015-03-22 NOTE — Progress Notes (Signed)
  Radiation Oncology         (336) [phone] ________________________________  Name: TIONNE DAYHOFF MRN: [id number]  Date: 03/22/2015  DOB: 1930-04-30  Radium-223 Infusion Note  Diagnosis:  Castration resistant prostate cancer with painful bone involvement  Current Infusion:    5  Planned Infusions:  6  Narrative: Mr. BOUBACAR LERETTE presented to nuclear medicine for treatment. His most recent blood counts were reviewed.  He remains a good candidate to proceed with Ra-223.  The patient was situated in an infusion suite with a contact barrier placed under his arm. Intravenous access was established, using sterile technique, and a normal saline infusion from a syringe was started.  Micro-dosimetry:  The prescribed radiation activity was assayed and confirmed to be within specified tolerance.  Special Treatment Procedure - Infusion:  The nuclear medicine technologist and I personally verified the dose activity to be delivered as specified in the written directive, and verified the patient identification via 2 separate methods.  The syringe containing the dose was attached to a 3 way stopcock, and then the valve was opened to the patient, and the dose delivered over a minute. No complications were noted.  The total administered dose was 131.6 microcuries in a combined volume of 10.3 cc (2 syringes).  A saline flush of the line and the syringe that contained the isotope was then performed.  The combined residual radioactivity in both syringes was 3.91 microcuries, so the actual infused isotope activity was 127.69 microcuries.   Pressure was applied to the venipuncture site, and a compression bandage placed.  Radiation Safety personnel were present to perform the discharge survey, as detailed on their documentation.   After a short period of observation, the patient had his IV removed.  Impression:  The patient tolerated his infusion relatively well.  Plan:  The patient will return in one month for ongoing  care.    ------------------------------------------------        Rexene Edison, MD

## 2015-03-27 ENCOUNTER — Other Ambulatory Visit: Payer: Self-pay | Admitting: Oncology

## 2015-04-05 ENCOUNTER — Other Ambulatory Visit: Payer: Self-pay | Admitting: Radiation Oncology

## 2015-04-05 ENCOUNTER — Telehealth: Payer: Self-pay | Admitting: *Deleted

## 2015-04-05 ENCOUNTER — Ambulatory Visit: Payer: Medicare Other | Admitting: Internal Medicine

## 2015-04-05 DIAGNOSIS — C61 Malignant neoplasm of prostate: Secondary | ICD-10-CM

## 2015-04-05 DIAGNOSIS — C7951 Secondary malignant neoplasm of bone: Principal | ICD-10-CM

## 2015-04-05 NOTE — Telephone Encounter (Signed)
CALLED PATIENT'S DAUGHTER - SHERRY ALLOWAY TO INFORM OF LAB AND XOFIGO INJ., LVM FOR A RETURN CALL

## 2015-04-07 ENCOUNTER — Ambulatory Visit (HOSPITAL_BASED_OUTPATIENT_CLINIC_OR_DEPARTMENT_OTHER): Payer: Medicare Other | Admitting: Oncology

## 2015-04-07 ENCOUNTER — Other Ambulatory Visit (HOSPITAL_BASED_OUTPATIENT_CLINIC_OR_DEPARTMENT_OTHER): Payer: Medicare Other

## 2015-04-07 ENCOUNTER — Telehealth: Payer: Self-pay | Admitting: Oncology

## 2015-04-07 VITALS — BP 171/60 | HR 65 | Temp 97.4°F | Resp 18 | Ht 67.0 in | Wt 196.5 lb

## 2015-04-07 DIAGNOSIS — K5900 Constipation, unspecified: Secondary | ICD-10-CM

## 2015-04-07 DIAGNOSIS — C61 Malignant neoplasm of prostate: Secondary | ICD-10-CM | POA: Diagnosis not present

## 2015-04-07 DIAGNOSIS — E291 Testicular hypofunction: Secondary | ICD-10-CM

## 2015-04-07 DIAGNOSIS — C7951 Secondary malignant neoplasm of bone: Secondary | ICD-10-CM

## 2015-04-07 LAB — COMPREHENSIVE METABOLIC PANEL (CC13)
ALBUMIN: 3.6 g/dL (ref 3.5–5.0)
ALK PHOS: 52 U/L (ref 40–150)
ALT: 15 U/L (ref 0–55)
AST: 19 U/L (ref 5–34)
Anion Gap: 7 mEq/L (ref 3–11)
BUN: 14.6 mg/dL (ref 7.0–26.0)
CO2: 23 mEq/L (ref 22–29)
CREATININE: 1.7 mg/dL — AB (ref 0.7–1.3)
Calcium: 9.2 mg/dL (ref 8.4–10.4)
Chloride: 113 mEq/L — ABNORMAL HIGH (ref 98–109)
EGFR: 37 mL/min/{1.73_m2} — ABNORMAL LOW (ref >90)
GLUCOSE: 204 mg/dL — AB (ref 70–140)
POTASSIUM: 3.7 meq/L (ref 3.5–5.1)
SODIUM: 144 meq/L (ref 136–145)
Total Bilirubin: 0.51 mg/dL (ref 0.20–1.20)
Total Protein: 6.3 g/dL — ABNORMAL LOW (ref 6.4–8.3)

## 2015-04-07 LAB — CBC WITH DIFFERENTIAL/PLATELET
BASO%: 0.5 % (ref 0.0–2.0)
BASOS ABS: 0 10*3/uL (ref 0.0–0.1)
EOS%: 3.9 % (ref 0.0–7.0)
Eosinophils Absolute: 0.1 10*3/uL (ref 0.0–0.5)
HEMATOCRIT: 32.1 % — AB (ref 38.4–49.9)
HEMOGLOBIN: 10.9 g/dL — AB (ref 13.0–17.1)
LYMPH%: 19.3 % (ref 14.0–49.0)
MCH: 31.5 pg (ref 27.2–33.4)
MCHC: 34 g/dL (ref 32.0–36.0)
MCV: 92.8 fL (ref 79.3–98.0)
MONO#: 0.4 10*3/uL (ref 0.1–0.9)
MONO%: 12.3 % (ref 0.0–14.0)
NEUT#: 1.9 10*3/uL (ref 1.5–6.5)
NEUT%: 64 % (ref 39.0–75.0)
Platelets: 134 10*3/uL — ABNORMAL LOW (ref 140–400)
RBC: 3.46 10*6/uL — ABNORMAL LOW (ref 4.20–5.82)
RDW: 15.4 % — AB (ref 11.0–14.6)
WBC: 3 10*3/uL — ABNORMAL LOW (ref 4.0–10.3)
lymph#: 0.6 10*3/uL — ABNORMAL LOW (ref 0.9–3.3)

## 2015-04-07 NOTE — Telephone Encounter (Signed)
Gave and printed appt sched and avs for pt for DEc °

## 2015-04-07 NOTE — Progress Notes (Signed)
Hematology and Oncology Follow Up Visit  [name] [name] [id number] June 13, 1930 81 y.o. 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 His initial diagnosis was in 2006 when he presented with a PSA of 6.26 the Gleason score 4+3 equals 7.  Prior Therapy:  He is status post definitive therapy with radiation and androgen deprivation completed in 2007. The patient developed recurrent disease in 2012 with a PSA rise up to 28 and T9 osseous metastasis and was treated with combined androgen deprivation with Lupron and Casodex. He had an excellent response with his PSA nadir down to 0.19. Most recently he developed a rise in his PSA up to 3.06 in July 2014 despite castrate levels of testosterone. His bone scan in July 2014 confirmed the presence of bony disease. Zytiga 1000 mg daily started in July 2014 till July 2015.  Xtandi 160 mg daily started in July of 2015 to April 2016.   Current therapy:  Xofigo started on 11/03/2014. He is status post 5 injections out of 6. He receives Niger monthly at [facility] urology. He receives Lupron every 4 months at [facility] urology.  Interim History:  [name] [name] returns today for a followup visit. Since the last visit, he reports symptoms of knee pain and occasional constipation. He reports that he is chronically constipated and move his bowels every 2-3 days. He does not take any stool softeners or lacks at this. His knee pain has been chronic in nature and have received injections in the past.   He continues to do well with Xofigo without any complications. He did report worsening fatigue after the last infusion but seems to have resolved at this time. He reports he is eating well.  His mobility have not changed and has not reported any syncope or falls. His quality of life and performance status have not changed.  He  does not report any other bone pain or pathological fractures.  He does not report any lower extremity edema or difficulty breathing. No abdominal pain, nausea, vomiting. His performance status is unchanged. Has not reported any of the fluid retention or any bone pain.  He does not report any headaches or blurry vision. He does not report any syncope or falls. He does not report chest pain, shortness of breath or difficulty breathing. Did not report any genitourinary bleeding or hemoptysis. Remainder of his review of systems unremarkable.  Medications: I have reviewed the patient's current medications.  Current Outpatient Prescriptions  Medication Sig Dispense Refill  . acetaminophen (TYLENOL) 500 MG tablet Take 1,000 mg by mouth every 6 (six) hours as needed for pain.    [name] amLODipine-valsartan (EXFORGE) 10-320 MG per tablet TAKE 1/2 TABLET BY MOUTH EVERY DAY 90 tablet 1  . aspirin EC 81 MG tablet Take 81 mg by mouth every morning.     [name] atorvastatin (LIPITOR) 10 MG tablet TAKE 1 TABLET BY MOUTH EVERY DAY 90 tablet 0  . calcium carbonate-magnesium hydroxide (ROLAIDS) 334 MG CHEW Chew 1 tablet by mouth 3 (three) times daily with meals.     . Calcium Citrate-Vitamin D (CITRACAL + D PO) Take 1 tablet by mouth daily.    . Denosumab (XGEVA [place]) Inject into the skin every 30 (thirty) days. Injects "around the first part of each month."    . glimepiride (AMARYL) 4 MG tablet TAKE 1 TABLET BY MOUTH DAILY BEFORE BREAKFAST 30 tablet 5  .  Glucosamine-Chondroitin (GLUCOSAMINE CHONDR COMPLEX) 500-400 MG CAPS Take by mouth daily.    [name] glucose blood (ACCU-CHEK AVIVA) test strip USE TO CHECK BLOOD SUGAR DAILY AND PRN 100 each 12  . Insulin Pen Needle (CAREFINE PEN NEEDLES) 32G X 4 MM MISC USE TO INJECT INSULIN DAILY 90 each 3  . leuprolide (LUPRON) 3.75 MG injection Inject 3.75 mg into the muscle every 4 (four) months.    . levothyroxine (SYNTHROID, LEVOTHROID) 50 MCG tablet TAKE 1 TABLET BY MOUTH DAILY 90 tablet 1   . Liraglutide 18 MG/3ML SOPN Inject 0.3 mLs (1.8 mg total) into the skin every morning. 6 mL 6  . LYRICA 50 MG capsule TAKE 2 CAPSULES BY MOUTH EVERY MORNING THEN TAKE 1 CAPSULE BY MOUTH EVERY EVENING 90 capsule 5  . metFORMIN (GLUCOPHAGE) 1000 MG tablet Take 1 tablet (1,000 mg total) by mouth daily with breakfast. 180 tablet 1  . NAMENDA XR 28 MG CP24 24 hr capsule TAKE 1 CAPSULE BY MOUTH DAILY 30 capsule 2  . nitroGLYCERIN (NITROSTAT) 0.4 MG SL tablet Place 0.4 mg under the tongue every 5 (five) minutes as needed. For chest pain    . Nutritional Supplements (JUICE PLUS FIBRE PO) Take 6 capsules by mouth daily.     [name] omega-3 acid ethyl esters (LOVAZA) 1 G capsule Take 1 g by mouth once a week.     . pioglitazone (ACTOS) 15 MG tablet TAKE 1 TABLET BY MOUTH DAILY 90 tablet 1  . traMADol (ULTRAM) 50 MG tablet TAKE 1 TABLET BY MOUTH EVERY 8 HOURS AS NEEDED 90 tablet 2  . VESICARE 10 MG tablet Take 5 mg by mouth daily.   11  . donepezil (ARICEPT) 10 MG tablet TAKE 1 TABLET BY MOUTH DAILY 90 tablet 3  . [DISCONTINUED] chlorthalidone (HYGROTON) 25 MG tablet Take 12.5 mg by mouth daily.     No current facility-administered medications for this visit.     Allergies:  Allergies  Allergen Reactions  . Codeine Phosphate Other (See Comments)    mouth sores    Past Medical History, Surgical history, Social history, and Family History were reviewed and updated.    Physical Exam: Blood pressure 171/60, pulse 65, temperature 97.4 F (36.3 C), temperature source Oral, resp. rate 18, height 5\' 7"  (1.702 m), weight 196 lb 8 oz (89.132 kg), SpO2 100 %. ECOG: 1 General appearance: alert and cooperative elderly gentleman without distress. Head: Normocephalic, without obvious abnormality no oral thrush noted. Neck: no adenopathy Lymph nodes: Cervical, supraclavicular, and axillary nodes normal. Heart:regular rate and rhythm, S1, S2 normal, no murmur, click, rub or gallop Lung:chest clear, no  wheezing, rales.  Abdomen: soft, non-tender, without masses or organomegaly. No ascites. EXT:no erythema, induration, or nodules. Trace edema noted. Neurological examination: No deficits.   Lab Results: Lab Results  Component Value Date   WBC 3.0* 04/07/2015   HGB 10.9* 04/07/2015   HCT 32.1* 04/07/2015   MCV 92.8 04/07/2015   PLT 134* 04/07/2015     Chemistry      Component Value Date/Time   NA 141 03/02/2015 1418   NA 137 02/22/2014 1026   K 4.1 03/02/2015 1418   K 4.3 02/22/2014 1026   CL 101 02/22/2014 1026   CO2 28 03/02/2015 1418   CO2 26 02/22/2014 1026   BUN 22.4 03/02/2015 1418   BUN 27* 02/22/2014 1026   CREATININE 1.8* 03/02/2015 1418   CREATININE 1.5 02/22/2014 1026   CREATININE 1.38* 02/11/2012 1614  Component Value Date/Time   CALCIUM 10.8* 03/02/2015 1418   CALCIUM 10.1 02/22/2014 1026   ALKPHOS 63 03/02/2015 1418   ALKPHOS 54 05/07/2013 1522   AST 15 03/02/2015 1418   AST 22 05/07/2013 1522   ALT 16 03/02/2015 1418   ALT 20 05/07/2013 1522   BILITOT 0.61 03/02/2015 1418   BILITOT 0.7 05/07/2013 1522     Lab Results  Component Value Date   PSA 15.92* 03/02/2015   PSA 12.24* 02/15/2015   PSA 12.38* 02/02/2015     Impression and Plan: This is an 81 year old gentleman with the following issues: 1. 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 His initial diagnosis dates back to 2006 with PSA of 6.26 and a Gleason score of 7. He progressed on [place].   He is currently on Xofigo and have tolerated it well. He received a total 5 treatments and his PSA has dropped from around 22 to 15. Overall he has tolerated this therapy without any major complications except for fatigue. The plan is to receive the 6 treatment which is scheduled in October and he will receive staging bone scan in December which will be scheduled today. This since was therapy might be needed in the future upon progression.   2. Bony disease: He is  receiving Xgeva at [facility] urology on a monthly basis.  3. Hormonal deprivation: He is receiving Lupron at [facility] urology every 4 months.  4. Knee pain: As stated in nature I have recommended follow-up with orthopedics  5. Constipation: I recommended he increase fiber in his diet and adequate hydration. He is not interested in any laxity is at this time.  6. Followup: He will return 4 weeks for an evaluation.  OKHTXH,FSFSE, MD 10/7/20161:17 PM

## 2015-04-10 LAB — PSA: PSA: 17.94 ng/mL — ABNORMAL HIGH (ref <=4.00)

## 2015-04-11 ENCOUNTER — Telehealth: Payer: Self-pay | Admitting: *Deleted

## 2015-04-11 NOTE — Telephone Encounter (Signed)
CALLED PATIENT TO REMIND OF LABS AND WEIGHT FOR XOFIGO INJ. ON 04-19-15, NO ANSWER WILL CALL LATER.

## 2015-04-12 ENCOUNTER — Encounter: Payer: Self-pay | Admitting: Internal Medicine

## 2015-04-12 ENCOUNTER — Ambulatory Visit
Admission: RE | Admit: 2015-04-12 | Discharge: 2015-04-12 | Disposition: A | Discharge status: Home / Self Care | Payer: Medicare Other | Source: Ambulatory Visit | Attending: Radiation Oncology | Admitting: Radiation Oncology

## 2015-04-12 ENCOUNTER — Ambulatory Visit (INDEPENDENT_AMBULATORY_CARE_PROVIDER_SITE_OTHER): Payer: Medicare Other | Admitting: Internal Medicine

## 2015-04-12 ENCOUNTER — Ambulatory Visit: Payer: Medicare Other

## 2015-04-12 ENCOUNTER — Other Ambulatory Visit: Payer: Self-pay | Admitting: Radiation Oncology

## 2015-04-12 VITALS — BP 110/76 | HR 52 | Temp 97.8°F | Resp 20 | Ht 67.0 in | Wt 195.0 lb

## 2015-04-12 DIAGNOSIS — I10 Essential (primary) hypertension: Secondary | ICD-10-CM | POA: Diagnosis not present

## 2015-04-12 DIAGNOSIS — E0842 Diabetes mellitus due to underlying condition with diabetic polyneuropathy: Secondary | ICD-10-CM | POA: Diagnosis not present

## 2015-04-12 DIAGNOSIS — C7951 Secondary malignant neoplasm of bone: Secondary | ICD-10-CM

## 2015-04-12 LAB — CBC WITH DIFFERENTIAL/PLATELET
BASO%: 0.3 % (ref 0.0–2.0)
BASOS ABS: 0 10*3/uL (ref 0.0–0.1)
EOS%: 3.8 % (ref 0.0–7.0)
Eosinophils Absolute: 0.1 10*3/uL (ref 0.0–0.5)
HCT: 31.6 % — ABNORMAL LOW (ref 38.4–49.9)
HGB: 10.7 g/dL — ABNORMAL LOW (ref 13.0–17.1)
LYMPH%: 25.4 % (ref 14.0–49.0)
MCH: 31.7 pg (ref 27.2–33.4)
MCHC: 33.9 g/dL (ref 32.0–36.0)
MCV: 93.5 fL (ref 79.3–98.0)
MONO#: 0.4 10*3/uL (ref 0.1–0.9)
MONO%: 12.2 % (ref 0.0–14.0)
NEUT#: 1.7 10*3/uL (ref 1.5–6.5)
NEUT%: 58.3 % (ref 39.0–75.0)
Platelets: 123 10*3/uL — ABNORMAL LOW (ref 140–400)
RBC: 3.38 10*6/uL — ABNORMAL LOW (ref 4.20–5.82)
RDW: 14.6 % (ref 11.0–14.6)
WBC: 2.9 10*3/uL — ABNORMAL LOW (ref 4.0–10.3)
lymph#: 0.7 10*3/uL — ABNORMAL LOW (ref 0.9–3.3)

## 2015-04-12 LAB — MICROALBUMIN / CREATININE URINE RATIO
CREATININE, U: 135.7 mg/dL
Microalb Creat Ratio: 3.2 mg/g (ref 0.0–30.0)
Microalb, Ur: 4.3 mg/dL — ABNORMAL HIGH (ref 0.0–1.9)

## 2015-04-12 LAB — HEMOGLOBIN A1C: Hgb A1c MFr Bld: 6.4 % (ref 4.6–6.5)

## 2015-04-12 NOTE — Patient Instructions (Signed)
Limit your sodium (Salt) intake  Please check your blood pressure on a regular basis.  If it is consistently greater than 150/90, please make an office appointment.   Please check your hemoglobin A1c every 3 months  

## 2015-04-12 NOTE — Progress Notes (Signed)
Pre visit review using our clinic review tool, if applicable. No additional management support is needed unless otherwise documented below in the visit note. 

## 2015-04-12 NOTE — Progress Notes (Signed)
Subjective:    Patient ID: [name] [name], male    DOB: 11-26-1929, 81 y.o.   MRN: [id number]  HPI  Wt Readings from Last 3 Encounters:  04/12/15 195 lb (88.451 kg)  04/07/15 196 lb 8 oz (89.132 kg)  03/15/15 198 lb 3.2 oz (89.903 kg)    Lab Results  Component Value Date   HGBA1C 8.7* 01/04/2015   81 year old patient who is seen today for his quarterly follow-up of diabetes.  His last hemoglobin A1c had increased from 6.9 to 8.7.  He now is on daily Victoza with much improved glycemic control.  He states blood sugars are occasionally less than 100.  He generally feels well.  There is been some modest weight loss.  He does complain of knee pain. He has metastatic prostate cancer and is undergoing active treatment. He has essential hypertension and dyslipidemia.  Past Medical History  Diagnosis Date  . Hypertension   . Hyperlipidemia   . Memory loss     mild  . Rectal ulcer     RESOLVED  . Constipation   . History of ventricular fibrillation 1998    while on treadmill  . Hypothyroidism   . IBS (irritable bowel syndrome)   . Colitis     RESOLVED  . ED (erectile dysfunction)   . Osteoporosis   . Depression   . Diabetes mellitus     fasting blood sugar 140-200 usually  . GERD (gastroesophageal reflux disease)   . Heart attack ([place]) 2009  . Dementia   . Neuromuscular disorder ([place])     DIABETIC NEUROPATHY  . Prostate cancer ([place]) 2007  . Pain     LOWER BACK  -- PAST HX OF STEROID EPIDURALS FOR PAIN RELIEF  . 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 CAD (coronary artery disease)     a. S/P CABG x 5 1998; b.  S/P stent LCX 2009;  c.  LHC 5/09: RCA proximal 90%, 95%, distally occluded, pCFX stents patent, OM1 30%, small left atrial branch 95%, oLAD occluded, S-dRCA patent, L-LAD/Dx patent, EF 60%=> Med Rx  ;  d.  Lex MV 3/13:  EF 63%,  no ischemia  e.  Lex Myoview 03/08/13:  EF 56%, inf and apical defect suggestive of thinning and soft tissue attenuation, no ischemia; low risk.    . Bone metastases ([place])   . S/P radiation therapy 07/17/2005 - 09/10/2005    Prostate 7800 cGy, 40 Sessions  . Cataract     bil eyes    Social History   Social History  . Marital Status: Widowed    Spouse Name: N/A  . Number of Children: 4  . Years of Education: N/A   Occupational History  . retired    Social History Main Topics  . Smoking status: Former Research scientist (life sciences)  . Smokeless tobacco: Former Systems developer    Quit date: 02/11/1948  . Alcohol Use: No  . Drug Use: No  . Sexual Activity: Not on file   Other Topics Concern  . Not on file   Social History Narrative   Right handed, Widowed, 4 kids, Caffeine 2 cups daily, occa soda.  Retired, Chief Executive Officer    Past Surgical History  Procedure Laterality Date  . Coronary artery bypass graft  1998    x5, with a left internal mammary to the LAD and diagonal, vein graft to the OM1 and  2, and a vein graft to the right coronary artery  . Coronary stent placement  11/13/2007    LCX  . Trigger finger repaired  2009  . Laparoscopic gastrectomy N/A 08/18/2012    Procedure: Laparoscopic Partial Gastrectomy;  Surgeon: [name], MD;  Location: [facility];  Service: General;  Laterality: N/A;  Laparoscopic Partial Gastrectomy,  . Colonoscopy    . Stomach surgery    . Upper gastrointestinal endoscopy      Family History  Problem Relation Age of Onset  . Stroke Father   . Pneumonia Father   . Colon cancer Neg Hx   . Esophageal cancer Neg Hx   . Rectal cancer Neg Hx   . Stomach cancer Neg Hx     Allergies  Allergen Reactions  . Codeine Phosphate Other (See Comments)    mouth sores    Current Outpatient Prescriptions on File Prior to Visit  Medication Sig Dispense Refill  . acetaminophen (TYLENOL) 500 MG tablet Take 1,000 mg by mouth every 6 (six) hours as needed for pain.    [name] amLODipine-valsartan  (EXFORGE) 10-320 MG per tablet TAKE 1/2 TABLET BY MOUTH EVERY DAY 90 tablet 1  . aspirin EC 81 MG tablet Take 81 mg by mouth every morning.     [name] atorvastatin (LIPITOR) 10 MG tablet TAKE 1 TABLET BY MOUTH EVERY DAY 90 tablet 0  . calcium carbonate-magnesium hydroxide (ROLAIDS) 334 MG CHEW Chew 1 tablet by mouth 3 (three) times daily with meals.     . Calcium Citrate-Vitamin D (CITRACAL + D PO) Take 1 tablet by mouth daily.    . Denosumab (XGEVA [place]) Inject into the skin every 30 (thirty) days. Injects "around the first part of each month."    . donepezil (ARICEPT) 10 MG tablet TAKE 1 TABLET BY MOUTH DAILY 90 tablet 3  . glimepiride (AMARYL) 4 MG tablet TAKE 1 TABLET BY MOUTH DAILY BEFORE BREAKFAST 30 tablet 5  . Glucosamine-Chondroitin (GLUCOSAMINE CHONDR COMPLEX) 500-400 MG CAPS Take by mouth daily.    [name] glucose blood (ACCU-CHEK AVIVA) test strip USE TO CHECK BLOOD SUGAR DAILY AND PRN 100 each 12  . Insulin Pen Needle (CAREFINE PEN NEEDLES) 32G X 4 MM MISC USE TO INJECT INSULIN DAILY 90 each 3  . leuprolide (LUPRON) 3.75 MG injection Inject 3.75 mg into the muscle every 4 (four) months.    . levothyroxine (SYNTHROID, LEVOTHROID) 50 MCG tablet TAKE 1 TABLET BY MOUTH DAILY 90 tablet 1  . Liraglutide 18 MG/3ML SOPN Inject 0.3 mLs (1.8 mg total) into the skin every morning. 6 mL 6  . LYRICA 50 MG capsule TAKE 2 CAPSULES BY MOUTH EVERY MORNING THEN TAKE 1 CAPSULE BY MOUTH EVERY EVENING 90 capsule 5  . metFORMIN (GLUCOPHAGE) 1000 MG tablet Take 1 tablet (1,000 mg total) by mouth daily with breakfast. 180 tablet 1  . NAMENDA XR 28 MG CP24 24 hr capsule TAKE 1 CAPSULE BY MOUTH DAILY 30 capsule 2  . nitroGLYCERIN (NITROSTAT) 0.4 MG SL tablet Place 0.4 mg under the tongue every 5 (five) minutes as needed. For chest pain    . Nutritional Supplements (JUICE PLUS FIBRE PO) Take 6 capsules by mouth daily.     [name] omega-3 acid ethyl esters (LOVAZA) 1 G capsule Take 1 g by mouth once a week.     . pioglitazone  (ACTOS) 15 MG tablet TAKE 1 TABLET BY MOUTH DAILY 90 tablet 1  . traMADol (ULTRAM) 50 MG tablet TAKE 1 TABLET  BY MOUTH EVERY 8 HOURS AS NEEDED 90 tablet 2  . VESICARE 10 MG tablet Take 5 mg by mouth daily.   11  . [DISCONTINUED] chlorthalidone (HYGROTON) 25 MG tablet Take 12.5 mg by mouth daily.     No current facility-administered medications on file prior to visit.    BP 110/76 mmHg  Pulse 52  Temp(Src) 97.8 F (36.6 C) (Oral)  Resp 20  Ht 5\' 7"  (1.702 m)  Wt 195 lb (88.451 kg)  BMI 30.53 kg/m2  SpO2 97%     Review of Systems  Constitutional: Negative for fever, chills, appetite change and fatigue.  HENT: Negative for congestion, dental problem, ear pain, hearing loss, sore throat, tinnitus, trouble swallowing and voice change.   Eyes: Negative for pain, discharge and visual disturbance.  Respiratory: Negative for cough, chest tightness, wheezing and stridor.   Cardiovascular: Negative for chest pain, palpitations and leg swelling.  Gastrointestinal: Negative for nausea, vomiting, abdominal pain, diarrhea, constipation, blood in stool and abdominal distention.  Genitourinary: Negative for urgency, hematuria, flank pain, discharge, difficulty urinating and genital sores.  Musculoskeletal: Positive for back pain and gait problem. Negative for myalgias, joint swelling, arthralgias and neck stiffness.       Bilateral knee pain  Skin: Negative for rash.  Neurological: Negative for dizziness, syncope, speech difficulty, weakness, numbness and headaches.  Hematological: Negative for adenopathy. Does not bruise/bleed easily.  Psychiatric/Behavioral: Negative for behavioral problems and dysphoric mood. The patient is not nervous/anxious.        Objective:   Physical Exam  Constitutional: He is oriented to person, place, and time. He appears well-developed.  HENT:  Head: Normocephalic.  Right Ear: External ear normal.  Left Ear: External ear normal.  Eyes: Conjunctivae and EOM  are normal.  Neck: Normal range of motion.  Cardiovascular: Normal rate and normal heart sounds.   Pulmonary/Chest: Breath sounds normal.  Abdominal: Bowel sounds are normal.  Musculoskeletal: Normal range of motion. He exhibits no edema or tenderness.  Neurological: He is alert and oriented to person, place, and time.  Psychiatric: He has a normal mood and affect. His behavior is normal.          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  Appears to be under better control.  Will check a hemoglobin A1c and urine for microalbumin Hypertension.  Controlled Metastatic prostate cancer  Recheck 3 months

## 2015-04-18 ENCOUNTER — Ambulatory Visit: Payer: Medicare Other | Admitting: Neurology

## 2015-04-18 ENCOUNTER — Telehealth: Payer: Self-pay | Admitting: *Deleted

## 2015-04-18 NOTE — Telephone Encounter (Signed)
CALLED PATIENT  TO  REMIND OF XOFIGO INJ, FOR 04-19-15- ARRIVAL TIME - 11 AM, LVM FOR A RETURN CALL

## 2015-04-19 ENCOUNTER — Ambulatory Visit: Payer: Medicare Other | Admitting: Neurology

## 2015-04-19 ENCOUNTER — Encounter (HOSPITAL_COMMUNITY)
Admission: RE | Admit: 2015-04-19 | Discharge: 2015-04-19 | Disposition: A | Discharge status: Home / Self Care | Payer: Medicare Other | Source: Ambulatory Visit | Attending: Radiation Oncology | Admitting: Radiation Oncology

## 2015-04-19 ENCOUNTER — Encounter: Payer: Self-pay | Admitting: Radiation Oncology

## 2015-04-19 ENCOUNTER — Telehealth: Payer: Self-pay | Admitting: Internal Medicine

## 2015-04-19 DIAGNOSIS — C7951 Secondary malignant neoplasm of bone: Secondary | ICD-10-CM | POA: Diagnosis present

## 2015-04-19 DIAGNOSIS — C61 Malignant neoplasm of prostate: Secondary | ICD-10-CM | POA: Diagnosis present

## 2015-04-19 NOTE — Progress Notes (Signed)
  Radiation Oncology         [phone] ________________________________  Name: TOMMASO CAVITT MRN: [id number]  Date: 04/19/2015  DOB: May 03, 1930  Radium-223 Infusion Note  Diagnosis:  Castration resistant prostate cancer with painful bone involvement  Current Infusion:    6  Planned Infusions:  6  Narrative: Mr. AYDEN HARDWICK presented to nuclear medicine for treatment. His most recent blood counts were reviewed.  He remains a good candidate to proceed with Ra-223.  The patient was situated in an infusion suite with a contact barrier placed under his arm. Intravenous access was established, using sterile technique, and a normal saline infusion from a syringe was started.  Micro-dosimetry:  The prescribed radiation activity was assayed and confirmed to be within specified tolerance.  Special Treatment Procedure - Infusion:  The nuclear medicine technologist and I personally verified the dose activity to be delivered as specified in the written directive, and verified the patient identification via 2 separate methods.  The syringes containing the dose were attached to a 3 way stopcock, and then the valves were opened to the patient, and the dose delivered over a minute for each syringe. No complications were noted.  The total administered dose was 132 for both syringes microcuries in a volume of 10.13 cc.  A saline flush of the line and the syringe that contained the isotope was then performed.  The residual radioactivity in both syringes was 5.2 microcuries, so the actual infused isotope activity was 126.8 microcuries.   Pressure was applied to the venipuncture site, and a compression bandage placed.  Radiation Safety personnel were present to perform the discharge survey, as detailed on their documentation.   After a short period of observation, the patient had his IV removed.  Impression:  The patient tolerated his infusion relatively well.  Plan:  The patient will return in one month for  ongoing care.    ------------------------------------------------        Rexene Edison, MD

## 2015-04-19 NOTE — Progress Notes (Signed)
CC: Dr. Zola Button, Dr. Franchot Gallo, Dr. Bluford Kaufmann  Follow-up note:  [name] was seen today prior to his sixth and final radium 223 infusion. He was without complaints today except for mild to moderate fatigue.He is more fatigued than he was last month.  He denies bone pain. His blood counts last week were satisfactory.  Laboratory data: Lab Results  Component Value Date   WBC 2.9* 04/12/2015   HGB 10.7* 04/12/2015   HCT 31.6* 04/12/2015   MCV 93.5 04/12/2015   PLT 123* 04/12/2015            Impression: Satisfactory progress.  Plan: Radiation therapy completed.  Follow-up visit with me in one month.  10 minutes was spent face-to-face with the patient, primarily counseling patient and coordinating his care.

## 2015-04-27 ENCOUNTER — Other Ambulatory Visit: Payer: Self-pay | Admitting: Internal Medicine

## 2015-04-28 NOTE — Telephone Encounter (Signed)
Error

## 2015-05-03 ENCOUNTER — Telehealth: Payer: Self-pay | Admitting: Internal Medicine

## 2015-05-03 NOTE — Telephone Encounter (Signed)
Pt request refill of the following: glucose blood (ACCU-CHEK AVIVA) test strip   Phamacy:  [place]

## 2015-05-04 MED ORDER — GLUCOSE BLOOD VI STRP: ORAL_STRIP | Status: DC

## 2015-05-04 NOTE — Telephone Encounter (Signed)
Pt notified Rx for Test strips sent to pharmacy.

## 2015-05-08 ENCOUNTER — Ambulatory Visit (INDEPENDENT_AMBULATORY_CARE_PROVIDER_SITE_OTHER): Payer: Medicare Other | Admitting: Neurology

## 2015-05-08 ENCOUNTER — Telehealth: Payer: Self-pay | Admitting: Internal Medicine

## 2015-05-08 ENCOUNTER — Encounter: Payer: Self-pay | Admitting: Neurology

## 2015-05-08 VITALS — BP 138/70 | HR 68 | Resp 16 | Ht 67.0 in | Wt 196.0 lb

## 2015-05-08 DIAGNOSIS — R413 Other amnesia: Secondary | ICD-10-CM

## 2015-05-08 NOTE — Telephone Encounter (Signed)
Patient said the pharmacy needs a diagnostic code for diabetic testing supplies before they can refill the prescription.

## 2015-05-08 NOTE — Telephone Encounter (Signed)
Left detailed message on personal voicemail that we are working on PA for test strips and as soon as we hear approval I will let you know. Any questions please call the office.

## 2015-05-08 NOTE — Telephone Encounter (Signed)
Please see message. °

## 2015-05-08 NOTE — Telephone Encounter (Signed)
Called the pharmacy and the pharmacist said they are waiting on Prior Authorization for Rx. Asked her if she sent paperwork? She said yes. Told her okay I will check with our PA person.

## 2015-05-08 NOTE — Telephone Encounter (Signed)
[name] [name] w/[employer] called to inform the doctor that the [place] request has been cancelled.

## 2015-05-08 NOTE — Patient Instructions (Signed)
We will continue with your memory medications.  We will have your family monitor your driving. We will monitor your memory scores and mood symptoms.

## 2015-05-08 NOTE — Progress Notes (Signed)
Subjective:    Patient ID: [name] [name] is a 81 y.o. male.  HPI     Interim history:    [name] [name] is an 81 year old right-handed gentleman, with an underlying complex medical history of hypertension, hyperlipidemia, heart disease, status post 3 vessel CABG in 1998, stent placement in 2009, left heart catheter in 2009, history of V. fib, hypothyroidism, IBS, ED, osteoporosis, depression, diabetes with neuropathy, reflux disease, and prostate cancer (radiation treatment in 2005 with recurrence and evidence of bony metastasis), who presents for followup consultation of his memory loss and neuropathy. He is accompanied by his daughter today. I last saw him on 10/17/2014, at which time he reported that he was driving locally within 5-7 mild radius, no nighttime driving, no highway driving. He had an aide come in 3 days a week on Mondays, Wednesdays, and Fridays and she would help him with his chores for about 3 hours each time. He was using a cane. He had no recent falls. He has had recurrence of his prostate cancer and was seeing his cancer doctor regularly. He opted not to do chemotherapy and was in the process of being evaluated for a new treatment option. He admitted that he was not drinking enough water. Memory was showing a slow decline per daughter. He was tolerating his medications. His MMSE was 27/30, CDT 4/4, AFT 9/min, GDS 2/15 at the time. I suggested we continue his medications.  Today, 05/08/2015: He reports doing okay, some knee pain. He has had 6 new treatments for his cancer. He has a follow up with Dr. Alen Blew. He walking some, but perhaps about 10 min. His appetite is good. He has a lady come in every day, M-Fr for about 3 hours. He does not always drink enough water. Creatinine generally has been between 1.4 and 1.8. Most recently it was 1.7. His daughter provides additional information. On most days he does not feel very good but this is nonspecific. He usually denies pain or anything  specific bothering him. He was tried on any antidepressant in the past but did not do well on it according to her.  Previously:    I saw him on 04/15/2014, at which time his MMSE was 25, and he was on long-acting Namenda 28 mg and Aricept 10 mg and I suggested we continue with the current treatment. He had been hospitalized in September while [place] the beach and he had presented with weakness and confusion. He was found to have low sodium levels which was attributed to medication.   I saw him on 10/14/2013, at which time he reported feeling stable for the most part. He had not fallen recently. He had trouble recalling names and dates. He was on [place] and Lupron for his prostate cancer at the time and he had a bone scan pending.    He lives alone, his wife died almost 3 years ago. He has 4 children, 3 are local and he gets to see them weekly. He has been driving, but not as good, local places, some after dark, stays in [facility].  I first met him on 04/15/2013, at which time I felt that his exam was in keeping with mild cognitive impairment or mild vascular dementia without behavioral disturbance and I felt that he was clinically stable. His MMSE scores, physical exam, clock drawing and animal fluency scores were stable and I suggested a routine six-month followup and no changes in his medications.   He previously followed with Dr.  Morene Antu and was last seen by him on 09/10/2012, at which time Dr. Erling Cruz felt he was stable and he did not make any changes to his medication regimen. He has a complex underlying medical history as above. He has received Lupron therapy for his prostate cancer, as well as radiation therapy and his gastric tumor was diagnosed by biopsy in August 2012, s/p surgery in 3/14. He is on medications for his cancer. He broke his R arm at age 43 and has loss of ROM d/t that.   He has had MCI, and was initially evaluated on 02/24/1997 at the request of Dr. Romeo Apple  for memory loss, which began after surgery with coronary artery bypass on 12/02/1996. Evaluation included B12 level, RPR, neuropsychological battery, and MRI study study of the brain 02/26/1997. Doppler study of the carotids showed 50-69% right ECA stenosis. He has short-term memory loss which has been stable. He has been on Aricept for over 15 years. He worked as a Chief Executive Officer. He drives a car and is independent in ADLs. He has had daytime sleepiness. In January 2012 he developed severe rectal pain which progressed to go into his left leg posteriorly to behind the knee and into the left foot. It can go into his scrotum and into his penis. MRI of the lumbar spine without contrast on 04/09/10, 09/07/10, and 01/07/11 showed mild DJD. MRI of the pelvis without contrast on 05/07/10 showed rectal wall thickening and no pelvic adenopathy. There was mild diffuse bladder wall thickness. 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 Bone scan 02/07/2011 showed possible metastatic process at T9. He has had bowel and bladder accidents. On 03/08/11, his MMSE was 29/30. He has left foot numbness on the bottom of his foot. His pain is 0-8/10. He has discontinued a fentanyl patch and Nucynta. In 08/2011 he developed pain in his right side including the buttocks extending to the bottom of his right foot with numbness. He had an epidural injection at L5-S1 for his severe pain with improvement in symptoms. His pain is made worse by bending and stooping. MRI of the lumbar spine on 10/05/2011 showed a focal disc protrusion at L5-S1 to the right of midline and small sclerotic metastasis to the the posterior aspect of the left iliac bone.     His Past Medical History Is Significant For: Past Medical History  Diagnosis Date  . Hypertension   . Hyperlipidemia   . Memory loss     mild  . Rectal ulcer     RESOLVED  . Constipation   . History of  ventricular fibrillation 1998    while on treadmill  . Hypothyroidism   . IBS (irritable bowel syndrome)   . Colitis     RESOLVED  . ED (erectile dysfunction)   . Osteoporosis   . Depression   . Diabetes mellitus     fasting blood sugar 140-200 usually  . GERD (gastroesophageal reflux disease)   . Heart attack ([place]) 2009  . Dementia   . Neuromuscular disorder ([place])     DIABETIC NEUROPATHY  . Prostate cancer ([place]) 2007  . Pain     LOWER BACK  -- PAST HX OF STEROID EPIDURALS FOR PAIN RELIEF  . 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  CAD (coronary artery disease)     a. S/P CABG x 5 1998; b.  S/P stent LCX 2009;  c.  LHC 5/09: RCA proximal 90%, 95%, distally occluded, pCFX stents patent, OM1 30%, small left atrial branch 95%, oLAD occluded, S-dRCA patent, L-LAD/Dx patent, EF 60%=> Med Rx  ;  d.  Lex MV 3/13:  EF 63%, no ischemia  e.  Lex Myoview 03/08/13:  EF 56%, inf and apical defect suggestive of thinning and soft tissue attenuation, no ischemia; low risk.    . Bone metastases ([place])   . S/P radiation therapy 07/17/2005 - 09/10/2005    Prostate 7800 cGy, 40 Sessions  . Cataract     bil eyes    His Past Surgical History Is Significant For: Past Surgical History  Procedure Laterality Date  . Coronary artery bypass graft  1998    x5, with a left internal mammary to the LAD and diagonal, vein graft to the OM1 and 2, and a vein graft to the right coronary artery  . Coronary stent placement  11/13/2007    LCX  . Trigger finger repaired  2009  . Laparoscopic gastrectomy N/A 08/18/2012    Procedure: Laparoscopic Partial Gastrectomy;  Surgeon: [name], MD;  Location: [facility];  Service: General;  Laterality: N/A;  Laparoscopic Partial Gastrectomy,  . Colonoscopy    . Stomach surgery    . Upper gastrointestinal endoscopy      His Family History  Is Significant For: Family History  Problem Relation Age of Onset  . Stroke Father   . Pneumonia Father   . Colon cancer Neg Hx   . Esophageal cancer Neg Hx   . Rectal cancer Neg Hx   . Stomach cancer Neg Hx     His Social History Is Significant For: Social History   Social History  . Marital Status: Widowed    Spouse Name: N/A  . Number of Children: 4  . Years of Education: N/A   Occupational History  . retired    Social History Main Topics  . Smoking status: Former Research scientist (life sciences)  . Smokeless tobacco: Former Systems developer    Quit date: 02/11/1948  . Alcohol Use: No  . Drug Use: No  . Sexual Activity: Not Asked   Other Topics Concern  . None   Social History Narrative   Right handed, Widowed, 4 kids, Caffeine 2 cups daily, occa soda.  Retired, Chief Executive Officer    His Allergies Are:  Allergies  Allergen Reactions  . Codeine Phosphate Other (See Comments)    mouth sores  :   His Current Medications Are:  Outpatient Encounter Prescriptions as of 05/08/2015  Medication Sig  . acetaminophen (TYLENOL) 500 MG tablet Take 1,000 mg by mouth every 6 (six) hours as needed for pain.  [name] amLODipine-valsartan (EXFORGE) 10-320 MG per tablet TAKE 1/2 TABLET BY MOUTH EVERY DAY  . aspirin EC 81 MG tablet Take 81 mg by mouth every morning.   [name] atorvastatin (LIPITOR) 10 MG tablet TAKE 1 TABLET BY MOUTH EVERY DAY  . calcium carbonate-magnesium hydroxide (ROLAIDS) 334 MG CHEW Chew 1 tablet by mouth 3 (three) times daily with meals.   . Calcium Citrate-Vitamin D (CITRACAL + D PO) Take 1 tablet by mouth daily.  . Denosumab (XGEVA [place]) Inject into the skin every 30 (thirty) days. Injects "around the first part of each month."  . donepezil (ARICEPT) 10 MG tablet TAKE 1 TABLET BY MOUTH DAILY  . glimepiride (AMARYL) 4 MG tablet TAKE 1  TABLET BY MOUTH DAILY BEFORE BREAKFAST  . Glucosamine-Chondroitin (GLUCOSAMINE CHONDR COMPLEX) 500-400 MG CAPS Take by mouth daily.  [name] glucose blood (ACCU-CHEK AVIVA) test strip USE  TO CHECK BLOOD SUGAR DAILY AND PRN  . Insulin Pen Needle (CAREFINE PEN NEEDLES) 32G X 4 MM MISC USE TO INJECT INSULIN DAILY  . leuprolide (LUPRON) 3.75 MG injection Inject 3.75 mg into the muscle every 4 (four) months.  . levothyroxine (SYNTHROID, LEVOTHROID) 50 MCG tablet TAKE 1 TABLET BY MOUTH DAILY  . Liraglutide 18 MG/3ML SOPN Inject 0.3 mLs (1.8 mg total) into the skin every morning.  [name] LYRICA 50 MG capsule TAKE 2 CAPSULES BY MOUTH EVERY MORNING THEN TAKE 1 CAPSULE BY MOUTH EVERY EVENING  . metFORMIN (GLUCOPHAGE) 1000 MG tablet Take 1 tablet (1,000 mg total) by mouth daily with breakfast.  . NAMENDA XR 28 MG CP24 24 hr capsule TAKE 1 CAPSULE BY MOUTH DAILY  . nitroGLYCERIN (NITROSTAT) 0.4 MG SL tablet Place 0.4 mg under the tongue every 5 (five) minutes as needed. For chest pain  . Nutritional Supplements (JUICE PLUS FIBRE PO) Take 6 capsules by mouth daily.   [name] omega-3 acid ethyl esters (LOVAZA) 1 G capsule Take 1 g by mouth once a week.   . pioglitazone (ACTOS) 15 MG tablet TAKE 1 TABLET BY MOUTH DAILY  . traMADol (ULTRAM) 50 MG tablet TAKE 1 TABLET BY MOUTH EVERY 8 HOURS AS NEEDED  . VESICARE 10 MG tablet Take 5 mg by mouth daily.    No facility-administered encounter medications on file as of 05/08/2015.  :  Review of Systems:  Out of a complete 14 point review of systems, all are reviewed and negative with the exception of these symptoms as listed below:        Review of Systems  Neurological: Positive for numbness.       Patient asks if he can start coming once a year for f/u.     Objective:  Neurologic Exam  Physical Exam Physical Examination:   Filed Vitals:   05/08/15 1503  BP: 138/70  Pulse: 68  Resp: 16   General Examination: The patient is a very pleasant 81 y.o. male in no acute distress. He is calm and cooperative with the exam. He denies Auditory Hallucinations and Visual Hallucinations. He is very well groomed and situated in a chair.   HEENT:  Normocephalic, atraumatic, pupils are equal, round and reactive to light and accommodation. He has cataracts, and R ptosis (this since birth, unchanged from before). Extraocular tracking shows mild saccadic breakdown without nystagmus noted. Hearing is impaired. Face is symmetric with no facial masking and normal facial sensation. There is no lip, neck or jaw tremor. Neck is not rigid with intact passive ROM. There are no carotid bruits on auscultation. Oropharynx exam reveals mild mouth dryness. No significant airway crowding is noted. Mallampati is class II. Tongue protrudes centrally and palate elevates symmetrically.    Chest: is clear to auscultation without wheezing, rhonchi or crackles noted.  Heart: sounds are regular and normal without murmurs, rubs or gallops noted.   Abdomen: is soft, non-tender and non-distended with normal bowel sounds appreciated on auscultation.  Extremities: There is trace edema in the ankles bilaterally. Pedal pulses are intact.   Skin: is warm and dry with no trophic changes noted. Age-related changes are noted on the skin. Multiple small bruises of varying ages are noted on the forearms. Of note, He is on ASA.  Musculoskeletal: exam reveals R elbow joint deformity (  since 7th grade), and tenderness and mild joint swelling in the R knee and L knee with more L knee pain. No erythema.   Neurologically:   Mental status: The patient is awake and alert, paying good  attention. He is able to completely provide the history. He is oriented to: person, place, time/date, situation, day of week, month of year and year. His memory, attention, language and knowledge are mildly impaired. There is no aphasia, agnosia, apraxia or anomia. There is a no significant degree of bradyphrenia. Speech is mildly hypophonic with no dysarthria noted. Mood is congruent and affect is blunted and flat.   On 04/15/13: His MMSE (Mini-Mental state exam) score was 30/30. CDT (Clock Drawing Test)  score was 4/4. AFT (Animal Fluency Test) score was 4.    On 04/15/2014: MMSE was 25/30, CDT was 4/4, AFT was 10.   On 10/17/2014: MMSE: 27/30, CDT 4/4, AFT 9/min, GDS 2 or 3/15.  On 05/08/2015: MMSE: 27/30, CDT: 4/4, AFT: 12/min.  Cranial nerves are as described above under HEENT exam. In addition, shoulder shrug is normal with equal shoulder height noted.  Motor exam: Normal bulk, and strength for age is noted. Tone is not rigid with absence of cogwheeling in the extremities. There is overall no significant bradykinesia. There is no drift or rebound. There is no tremor. Romberg is not tested as he is not able to stand narrow based without assistance. Reflexes are 1+ in the upper extremities, trace in both knees and absent in both ankles. Fine motor skills: Finger taps, hand movements, and rapid alternating patting are mildly impaired bilaterally. Foot taps and foot agility are mildly impaired bilaterally.   Cerebellar testing shows no dysmetria or intention tremor on finger to nose testing. There is no truncal or gait ataxia.   Sensory exam is intact to light touch, pinprick, vibration, temperature sense in the upper extremities, and decrease to PP and vibration sense in the distal lower extremities, up to knees, R>L, unchanged.   Gait, station and balance: He stands up from the seated position with mild difficulty and needs to push himself up, reporting pain in L knee. He does not seem to straighten out his knees. He does not pick up his feet very well. He walks with a cane on the right side. Balance is mildly impaired. Tandem walk is not possible for him.   Assessment and Plan:   In summary, [name] [name] is a very pleasant 81 year old male with a complex underlying medical history of diabetes, metastatic prostate cancer with evidence of progressive disease, stomach tumor, osteoporosis, heart disease, hyperlipidemia, thyroid disease and hypertension, status post three-vessel CABG in 1998, MI in  2009 with stent placement, on treatment for his recurrent prostate cancer with bony metastases, s/p radiation therapy, and s/p 6 radium 223 infusions, who presents for followup consultation of his memory loss. His history and physical exam are keeping with MCI (mild cognitive impairment) or mild dementia without behavioral disturbance, with risk for vascular dementia, given his history. His MMSE scores are stable. He is on maximum dose of Namenda XR 28 mg once daily and has been on 10 mg of generic Aricept for over 15 years. I suggested we continue with the current medications and he did not need refills today. He has evidence of neuropathy, which is stable. This is likely secondary to diabetes. His A1c last month was good at 6.4. I asked the patient to stay active mentally and physically, but unfortunately his knee pain has  limited his physical activity. I encouraged the patient to eat healthy, exercise daily and keep well hydrated, to keep a scheduled bedtime and wake time routine, to not skip any meals and eat healthy snacks in between meals and to have protein with every meal. I stressed the importance of regular exercise, within of course the patient's own mobility limitations. Thankfully he has great family support. His children and his grandchildren are involved in his care. I do worry somewhat about his driving, but his daughter has monitored his driving recently and has driven behind him as well and feels that he is okay for local driving during daytime light. He has a caretaker for about 3 hours each day who also takes him to appointments and take some grocery shopping. He walks with a cane. He has a call alert button. His daughter lives about 15 minutes away and his son lives in [place]. I suggested a six-month follow-up. I asked him to drink more water, to try to increase it up to 6 glasses of water per day if possible. I answered all their questions today and the patient and his daughter  were in  agreement with the above outlined plan. I encouraged him to call with any interim questions, concerns, problems, or updates.  I spent 25 minutes in total face-to-face time with the patient, more than 50% of which was spent in counseling and coordination of care, reviewing test results, reviewing medication and discussing or reviewing the diagnosis of dementia, its prognosis and treatment options.

## 2015-05-29 ENCOUNTER — Encounter: Payer: Self-pay | Admitting: Radiation Oncology

## 2015-05-30 ENCOUNTER — Other Ambulatory Visit: Payer: Self-pay | Admitting: Internal Medicine

## 2015-05-30 ENCOUNTER — Ambulatory Visit: Admission: RE | Admit: 2015-05-30 | Payer: Medicare Other | Source: Ambulatory Visit | Admitting: Radiation Oncology

## 2015-06-12 ENCOUNTER — Ambulatory Visit
Admission: RE | Admit: 2015-06-12 | Discharge: 2015-06-12 | Disposition: A | Discharge status: Home / Self Care | Payer: Medicare Other | Source: Ambulatory Visit | Attending: Radiation Oncology | Admitting: Radiation Oncology

## 2015-06-12 ENCOUNTER — Encounter: Payer: Self-pay | Admitting: Radiation Oncology

## 2015-06-12 VITALS — BP 149/59 | HR 50 | Temp 97.5°F | Ht 67.0 in | Wt 196.1 lb

## 2015-06-12 DIAGNOSIS — C7951 Secondary malignant neoplasm of bone: Secondary | ICD-10-CM

## 2015-06-12 NOTE — Progress Notes (Addendum)
CC: Dr. Zola Button,  Dr. Franchot Gallo,  Dr. Bluford Kaufmann   Follow-up note: [name] [name] returns today almost 2 months following his last and final Xofigo (Radium 223 ) infusion.  He remains clinically stable. He denies bone pain. He is scheduled for a follow-up bone scan this Wednesday and then a follow-up visit with  [name] [name] this Friday.  His last bone scan was in March 2017. He was seen for a follow-up visit by neurology 5 weeks ago and he is felt to be clinically stable from a neurologic/memory standpoint.   Physical examination: Alert and oriented.  Vital signs pending.   He is not examined today.  Lab Results  Component Value Date   PSA 17.94* 04/07/2015   PSA 15.92* 03/02/2015   PSA 12.24* 02/15/2015   Lab Results  Component Value Date   WBC 2.9* 04/12/2015   HGB 10.7* 04/12/2015   HCT 31.6* 04/12/2015   MCV 93.5 04/12/2015   PLT 123* 04/12/2015      Impression: Clinically stable. He has had a slow rise in his PSA since August. [name] [name] will see him this Friday after his bone scan this Wednesday.  He is also scheduled for blood work.   Plan: As above. Follow-up visit here as needed.

## 2015-06-12 NOTE — Progress Notes (Signed)
[name] [name] denies any pain at this time.

## 2015-06-14 ENCOUNTER — Encounter (HOSPITAL_COMMUNITY)
Admission: RE | Admit: 2015-06-14 | Discharge: 2015-06-14 | Disposition: A | Discharge status: Home / Self Care | Payer: Medicare Other | Source: Ambulatory Visit | Attending: Oncology | Admitting: Oncology

## 2015-06-14 ENCOUNTER — Other Ambulatory Visit (HOSPITAL_BASED_OUTPATIENT_CLINIC_OR_DEPARTMENT_OTHER): Payer: Medicare Other

## 2015-06-14 DIAGNOSIS — C61 Malignant neoplasm of prostate: Secondary | ICD-10-CM

## 2015-06-14 DIAGNOSIS — C7951 Secondary malignant neoplasm of bone: Secondary | ICD-10-CM | POA: Insufficient documentation

## 2015-06-14 LAB — CBC WITH DIFFERENTIAL/PLATELET
BASO%: 0.7 % (ref 0.0–2.0)
BASOS ABS: 0 10*3/uL (ref 0.0–0.1)
EOS ABS: 0.2 10*3/uL (ref 0.0–0.5)
EOS%: 4.5 % (ref 0.0–7.0)
HEMATOCRIT: 31 % — AB (ref 38.4–49.9)
HEMOGLOBIN: 10.1 g/dL — AB (ref 13.0–17.1)
LYMPH%: 19.9 % (ref 14.0–49.0)
MCH: 31 pg (ref 27.2–33.4)
MCHC: 32.5 g/dL (ref 32.0–36.0)
MCV: 95.4 fL (ref 79.3–98.0)
MONO#: 0.4 10*3/uL (ref 0.1–0.9)
MONO%: 10.9 % (ref 0.0–14.0)
NEUT#: 2.2 10*3/uL (ref 1.5–6.5)
NEUT%: 64 % (ref 39.0–75.0)
PLATELETS: 128 10*3/uL — AB (ref 140–400)
RBC: 3.25 10*6/uL — ABNORMAL LOW (ref 4.20–5.82)
RDW: 14.6 % (ref 11.0–14.6)
WBC: 3.4 10*3/uL — ABNORMAL LOW (ref 4.0–10.3)
lymph#: 0.7 10*3/uL — ABNORMAL LOW (ref 0.9–3.3)

## 2015-06-14 LAB — COMPREHENSIVE METABOLIC PANEL
ALBUMIN: 3.8 g/dL (ref 3.5–5.0)
ALK PHOS: 117 U/L (ref 40–150)
ALT: 11 U/L (ref 0–55)
ANION GAP: 8 meq/L (ref 3–11)
AST: 17 U/L (ref 5–34)
BUN: 36.9 mg/dL — AB (ref 7.0–26.0)
CALCIUM: 9.4 mg/dL (ref 8.4–10.4)
CHLORIDE: 110 meq/L — AB (ref 98–109)
CO2: 21 mEq/L — ABNORMAL LOW (ref 22–29)
Creatinine: 2.6 mg/dL — ABNORMAL HIGH (ref 0.7–1.3)
EGFR: 21 mL/min/{1.73_m2} — AB (ref >90)
Glucose: 177 mg/dl — ABNORMAL HIGH (ref 70–140)
POTASSIUM: 4.4 meq/L (ref 3.5–5.1)
Sodium: 139 mEq/L (ref 136–145)
Total Bilirubin: 0.54 mg/dL (ref 0.20–1.20)
Total Protein: 7.1 g/dL (ref 6.4–8.3)

## 2015-06-14 MED ORDER — TECHNETIUM TC 99M MEDRONATE IV KIT
26.6000 | PACK | Freq: Once | INTRAVENOUS | Status: AC | PRN
  Administered 2015-06-14: 26.6 via INTRAVENOUS

## 2015-06-15 LAB — PSA: PSA: 49.67 ng/mL — AB (ref <=4.00)

## 2015-06-16 ENCOUNTER — Telehealth: Payer: Self-pay | Admitting: Oncology

## 2015-06-16 ENCOUNTER — Ambulatory Visit (HOSPITAL_BASED_OUTPATIENT_CLINIC_OR_DEPARTMENT_OTHER): Payer: Medicare Other | Admitting: Oncology

## 2015-06-16 VITALS — BP 160/60 | HR 56 | Temp 97.5°F | Resp 18 | Ht 67.0 in | Wt 193.1 lb

## 2015-06-16 DIAGNOSIS — E291 Testicular hypofunction: Secondary | ICD-10-CM

## 2015-06-16 DIAGNOSIS — C7951 Secondary malignant neoplasm of bone: Secondary | ICD-10-CM

## 2015-06-16 DIAGNOSIS — C61 Malignant neoplasm of prostate: Secondary | ICD-10-CM | POA: Diagnosis not present

## 2015-06-16 DIAGNOSIS — M25569 Pain in unspecified knee: Secondary | ICD-10-CM | POA: Diagnosis not present

## 2015-06-16 NOTE — Progress Notes (Signed)
Hematology and Oncology Follow Up Visit  [name] [name] [id number] 1929-09-20 81 y.o. 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 His initial diagnosis was in 2006 when he presented with a PSA of 6.26 the Gleason score 4+3 equals 7.  Prior Therapy:  He is status post definitive therapy with radiation and androgen deprivation completed in 2007. The patient developed recurrent disease in 2012 with a PSA rise up to 28 and T9 osseous metastasis and was treated with combined androgen deprivation with Lupron and Casodex. He had an excellent response with his PSA nadir down to 0.19. Most recently he developed a rise in his PSA up to 3.06 in July 2014 despite castrate levels of testosterone. His bone scan in July 2014 confirmed the presence of bony disease. Zytiga 1000 mg daily started in July 2014 till July 2015.  Xtandi 160 mg daily started in July of 2015 to April 2016. Xofigo started on 11/03/2014. He is status post 6 injections completed in October 2016.  Current therapy:  He receives [place] monthly at [facility] urology. He receives Lupron every 4 months at [facility] urology.  Interim History:  [name] returns today for a followup visit with his children. Since the last visit, he reports relatively well overall without any major changes. He reports symptoms of knee pain and hip pain which is not changed dramatically. Overall he is not debilitated and able to ambulate without any major difficulties. He does use a cane but did not report any falls or syncope. Has not reported any shoulder pain back pain or hip pain. His appetite remains excellent and have gained weight.  He completed Xofigo without any complications. He did report slight fatigue after the last infusion but seems to have resolved at this time.  He does not report any headaches,  blurry vision, syncope or seizures. Does not report any fevers, chills, sweats or weight loss. Does not report any chest pain, palpitation orthopnea. He does not report any lower extremity edema or difficulty breathing. No abdominal pain, nausea, vomiting. Has not reported any of the fluid retention or any bone pain. Did not report any genitourinary bleeding or hemoptysis. Remainder of his review of systems unremarkable.  Medications: I have reviewed the patient's current medications.  Current Outpatient Prescriptions  Medication Sig Dispense Refill  . acetaminophen (TYLENOL) 500 MG tablet Take 1,000 mg by mouth every 6 (six) hours as needed for pain.    [name] amLODipine-valsartan (EXFORGE) 10-320 MG per tablet TAKE 1/2 TABLET BY MOUTH EVERY DAY 90 tablet 1  . aspirin EC 81 MG tablet Take 81 mg by mouth every morning.     [name] atorvastatin (LIPITOR) 10 MG tablet TAKE 1 TABLET BY MOUTH EVERY DAY 90 tablet 0  . calcium carbonate-magnesium hydroxide (ROLAIDS) 334 MG CHEW Chew 1 tablet by mouth 3 (three) times daily with meals.     . Calcium Citrate-Vitamin D (CITRACAL + D PO) Take 1 tablet by mouth daily.    . Denosumab (XGEVA [place]) Inject into the skin every 30 (thirty) days. Injects "around the first part of each month."    . donepezil (ARICEPT) 10 MG tablet TAKE 1 TABLET BY MOUTH DAILY 90 tablet 3  . glimepiride (AMARYL) 4 MG tablet TAKE 1 TABLET BY MOUTH DAILY BEFORE BREAKFAST 30 tablet 5  . Glucosamine-Chondroitin (GLUCOSAMINE CHONDR COMPLEX) 500-400 MG CAPS Take by mouth daily.    [name]  glucose blood (ACCU-CHEK AVIVA) test strip USE TO CHECK BLOOD SUGAR DAILY AND PRN 100 each 6  . Insulin Pen Needle (CAREFINE PEN NEEDLES) 32G X 4 MM MISC USE TO INJECT INSULIN DAILY 90 each 3  . leuprolide (LUPRON) 3.75 MG injection Inject 3.75 mg into the muscle every 4 (four) months.    . levothyroxine (SYNTHROID, LEVOTHROID) 50 MCG tablet TAKE 1 TABLET BY MOUTH DAILY 90 tablet 1  . Liraglutide 18 MG/3ML SOPN Inject 0.3  mLs (1.8 mg total) into the skin every morning. 6 mL 6  . metFORMIN (GLUCOPHAGE) 1000 MG tablet Take 1 tablet (1,000 mg total) by mouth daily with breakfast. 180 tablet 1  . NAMENDA XR 28 MG CP24 24 hr capsule TAKE 1 CAPSULE BY MOUTH DAILY 30 capsule 5  . nitroGLYCERIN (NITROSTAT) 0.4 MG SL tablet Place 0.4 mg under the tongue every 5 (five) minutes as needed. For chest pain    . Nutritional Supplements (JUICE PLUS FIBRE PO) Take 6 capsules by mouth daily.     [name] omega-3 acid ethyl esters (LOVAZA) 1 G capsule Take 1 g by mouth once a week.     . pioglitazone (ACTOS) 15 MG tablet TAKE 1 TABLET BY MOUTH DAILY 90 tablet 1  . traMADol (ULTRAM) 50 MG tablet TAKE 1 TABLET BY MOUTH EVERY 8 HOURS AS NEEDED 90 tablet 2  . VESICARE 10 MG tablet Take 5 mg by mouth daily.   11  . LYRICA 50 MG capsule TAKE 2 CAPSULES BY MOUTH EVERY MORNING THEN TAKE 1 CAPSULE BY MOUTH EVERY EVENING 90 capsule 5  . [DISCONTINUED] chlorthalidone (HYGROTON) 25 MG tablet Take 12.5 mg by mouth daily.     No current facility-administered medications for this visit.     Allergies:  Allergies  Allergen Reactions  . Codeine Phosphate Other (See Comments)    mouth sores    Past Medical History, Surgical history, Social history, and Family History were reviewed and updated.    Physical Exam: Blood pressure 160/60, pulse 56, temperature 97.5 F (36.4 C), temperature source Oral, resp. rate 18, height 5\' 7"  (1.702 m), weight 193 lb 1.6 oz (87.59 kg), SpO2 100 %. ECOG: 1 General appearance: alert and cooperative elderly gentleman who appeared comfortable. Head: Normocephalic, without obvious abnormality no oral ulcers or lesions. Neck: no adenopathy Lymph nodes: Cervical, supraclavicular, and axillary nodes normal. Heart:regular rate and rhythm, S1, S2 normal, no murmur, click, rub or gallop no edema noted. Lung:chest clear, no wheezing, rales.  Abdomen: soft, non-tender, without masses or organomegaly. No shifting dullness  or ascites. EXT:no erythema, induration, or nodules.  Neurological examination: No deficits.   Lab Results: Lab Results  Component Value Date   WBC 3.4* 06/14/2015   HGB 10.1* 06/14/2015   HCT 31.0* 06/14/2015   MCV 95.4 06/14/2015   PLT 128* 06/14/2015     Chemistry      Component Value Date/Time   NA 139 06/14/2015 1121   NA 137 02/22/2014 1026   K 4.4 06/14/2015 1121   K 4.3 02/22/2014 1026   CL 101 02/22/2014 1026   CO2 21* 06/14/2015 1121   CO2 26 02/22/2014 1026   BUN 36.9* 06/14/2015 1121   BUN 27* 02/22/2014 1026   CREATININE 2.6* 06/14/2015 1121   CREATININE 1.5 02/22/2014 1026   CREATININE 1.38* 02/11/2012 1614      Component Value Date/Time   CALCIUM 9.4 06/14/2015 1121   CALCIUM 10.1 02/22/2014 1026   ALKPHOS 117 06/14/2015 1121   ALKPHOS  54 05/07/2013 1522   AST 17 06/14/2015 1121   AST 22 05/07/2013 1522   ALT 11 06/14/2015 1121   ALT 20 05/07/2013 1522   BILITOT 0.54 06/14/2015 1121   BILITOT 0.7 05/07/2013 1522     Lab Results  Component Value Date   PSA 49.67* 06/14/2015   PSA 17.94* 04/07/2015   PSA 15.92* 03/02/2015   FINDINGS: Interval increase in number of metastatic lesions within the thoracic and lumbar spine. Interval increase in number of metastasis within the sacrum. New metastasis within the RIGHT shoulder. New rib lesions.  Several lesions are less conspicuous than on comparison exam including a large lesion in the posterior medial LEFT aspect of the iliac bone as well as several long lesions within the ribs.  IMPRESSION: 1. Progression of skeletal metastasis with multiple new lesions in the spine, sacrum, pelvis, shoulders, and ribs. 2. Several lesions on comparison exam are less conspicuous.  Impression and Plan: This is an 81 year old gentleman with the following issues: 1. 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 His initial diagnosis dates back to 2006 with PSA of 6.26 and a Gleason score of 7.  He progressed on [place].   He is S/P Xofigo and have tolerated it well. He received a total 6 treatments completed in October 2016. His PSA had a reasonable decline initially down to 12 but most recently was up to 49. Bone scan obtained on 06/14/2015 was personally reviewed and images was shared with the patient and his family.  Options of treatment were reviewed today which include systemic chemotherapy with Taxotere, supportive care only, second line hormonal therapy with ketoconazole and prednisone. The rationale for using all these options were reviewed as well as risks and benefits. I explained to him that the only proven therapy to palliate symptoms and improve overall survival would be Taxotere chemotherapy. Risks associated with this therapy was discussed including nausea, fatigue, myelosuppression and possible sepsis. The benefit would be palliation in symptoms and improve overall survival.  Given the fact that he is asymptomatic from his cancer and his quality of life although reasonable but have not changed, he would like to defer chemotherapy. I feel it would be reasonable to do so given the side effects associated with chemotherapy that might lower his quality of life. We have agreed on a short period of observation and surveillance and intervention upon symptomatic progression.  I also discussed with him the role of hospice if he becomes severely symptomatic and not willing or unable to proceed with chemotherapy.  He understand his disease is incurable and regardless to treatment option the goal would be palliation at best. He understand he has limited life expectancy likely in the months rather than years.  2. Bony disease: He is receiving Xgeva at [facility] urology on a monthly basis. I have recommended continuing that for the time being.  3. Hormonal deprivation: He is receiving Lupron at [facility] urology every 4 months. I encouraged him to continue that at this time.  4.  Knee pain: Related to arthritis rather than malignancy.  5. Followup: He will return 4 weeks for an evaluation.  Zola Button, MD 12/16/201612:47 PM

## 2015-06-16 NOTE — Telephone Encounter (Signed)
Gave and printed appt sched and avs for pt for Jan 2017 °

## 2015-07-10 ENCOUNTER — Ambulatory Visit: Payer: Medicare Other | Admitting: Internal Medicine

## 2015-07-11 ENCOUNTER — Encounter: Payer: Self-pay | Admitting: Internal Medicine

## 2015-07-11 ENCOUNTER — Ambulatory Visit (INDEPENDENT_AMBULATORY_CARE_PROVIDER_SITE_OTHER): Payer: Medicare Other | Admitting: Internal Medicine

## 2015-07-11 VITALS — BP 156/80 | HR 66 | Temp 97.6°F | Resp 20 | Ht 67.0 in | Wt 187.0 lb

## 2015-07-11 DIAGNOSIS — G3184 Mild cognitive impairment, so stated: Secondary | ICD-10-CM

## 2015-07-11 DIAGNOSIS — E039 Hypothyroidism, unspecified: Secondary | ICD-10-CM

## 2015-07-11 DIAGNOSIS — I10 Essential (primary) hypertension: Secondary | ICD-10-CM

## 2015-07-11 DIAGNOSIS — E0842 Diabetes mellitus due to underlying condition with diabetic polyneuropathy: Secondary | ICD-10-CM | POA: Diagnosis not present

## 2015-07-11 DIAGNOSIS — C7951 Secondary malignant neoplasm of bone: Secondary | ICD-10-CM

## 2015-07-11 LAB — POCT GLUCOSE (DEVICE FOR HOME USE): Glucose Fasting, POC: 151 mg/dL — AB (ref 70–99)

## 2015-07-11 MED ORDER — GLIMEPIRIDE 4 MG PO TABS: 2.0000 mg | ORAL_TABLET | Freq: Every day | ORAL | Status: DC

## 2015-07-11 NOTE — Patient Instructions (Signed)
Drink as much fluid as you  can tolerate over the next few days  Return in one month for follow-up   Hold Metformin  Decrease Amaryl (glimepiride) to 1 half tablet daily

## 2015-07-11 NOTE — Progress Notes (Signed)
Pre visit review using our clinic review tool, if applicable. No additional management support is needed unless otherwise documented below in the visit note. 

## 2015-07-11 NOTE — Progress Notes (Signed)
Subjective:    Patient ID: [name] [name], male    DOB: 08-15-1929, 82 y.o.   MRN: [id number]  HPI  Lab Results  Component Value Date   HGBA1C 6.4 04/12/2015    Wt Readings from Last 3 Encounters:  07/11/15 187 lb (84.823 kg)  06/16/15 193 lb 1.6 oz (87.59 kg)  06/12/15 196 lb 1.6 oz (88.21 kg)   82 year old patient who is seen today in follow-up.  He has type 2 diabetes. Last hemoglobin A1c was tightly controlled.  He is accompanied by his daughter.  He has been followed closely by oncology with progressive metastatic prostate cancer with worsening osseous metastases.  Patient denies any pain except for his chronic diabetic foot pain.  Over the past month.  There is been weight loss and anorexia  He has a long history of cognitive impairment that has worsened.  He does have some home assistance, but basically is responsible for his meals and self administration of medications.  Apparently he has not taken any medications for the past 2 days  Laboratory studies were performed recently that revealed worsening renal indices.  He is scheduled for repeat lab in 2 days  Past Medical History  Diagnosis Date  . Hypertension   . Hyperlipidemia   . Memory loss     mild  . Rectal ulcer     RESOLVED  . Constipation   . History of ventricular fibrillation 1998    while on treadmill  . Hypothyroidism   . IBS (irritable bowel syndrome)   . Colitis     RESOLVED  . ED (erectile dysfunction)   . Osteoporosis   . Depression   . Diabetes mellitus     fasting blood sugar 140-200 usually  . GERD (gastroesophageal reflux disease)   . Heart attack ([place]) 2009  . Dementia   . Neuromuscular disorder ([place])     DIABETIC NEUROPATHY  . Prostate cancer ([place]) 2007  . Pain     LOWER BACK  -- PAST HX OF STEROID EPIDURALS FOR PAIN RELIEF  . 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 CAD (coronary artery disease)     a. S/P CABG x 5 1998; b.  S/P stent LCX 2009;  c.  LHC 5/09: RCA proximal 90%, 95%, distally occluded, pCFX stents patent, OM1 30%, small left atrial branch 95%, oLAD occluded, S-dRCA patent, L-LAD/Dx patent, EF 60%=> Med Rx  ;  d.  Lex MV 3/13:  EF 63%, no ischemia  e.  Lex Myoview 03/08/13:  EF 56%, inf and apical defect suggestive of thinning and soft tissue attenuation, no ischemia; low risk.    . Bone metastases ([place])   . S/P radiation therapy 07/17/2005 - 09/10/2005    Prostate 7800 cGy, 40 Sessions  . Cataract     bil eyes    Social History   Social History  . Marital Status: Widowed    Spouse Name: N/A  . Number of Children: 4  . Years of Education: N/A   Occupational History  . retired    Social History Main Topics  . Smoking status: Former Research scientist (life sciences)  . Smokeless tobacco: Former Systems developer    Quit date: 02/11/1948  . Alcohol Use: No  . Drug Use: No  . Sexual Activity: Not on file   Other Topics Concern  . Not on file   Social History Narrative  Right handed, Widowed, 4 kids, Caffeine 2 cups daily, occa soda.  Retired, Chief Executive Officer    Past Surgical History  Procedure Laterality Date  . Coronary artery bypass graft  1998    x5, with a left internal mammary to the LAD and diagonal, vein graft to the OM1 and 2, and a vein graft to the right coronary artery  . Coronary stent placement  11/13/2007    LCX  . Trigger finger repaired  2009  . Laparoscopic gastrectomy N/A 08/18/2012    Procedure: Laparoscopic Partial Gastrectomy;  Surgeon: [name], MD;  Location: [facility] ORS;  Service: General;  Laterality: N/A;  Laparoscopic Partial Gastrectomy,  . Colonoscopy    . Stomach surgery    . Upper gastrointestinal endoscopy      Family History  Problem Relation Age of Onset  . Stroke Father   . Pneumonia Father   . Colon cancer Neg Hx   . Esophageal cancer Neg Hx   . Rectal cancer Neg Hx   . Stomach cancer Neg Hx      Allergies  Allergen Reactions  . Codeine Phosphate Other (See Comments)    mouth sores    Current Outpatient Prescriptions on File Prior to Visit  Medication Sig Dispense Refill  . acetaminophen (TYLENOL) 500 MG tablet Take 1,000 mg by mouth every 6 (six) hours as needed for pain.    [name] amLODipine-valsartan (EXFORGE) 10-320 MG per tablet TAKE 1/2 TABLET BY MOUTH EVERY DAY 90 tablet 1  . aspirin EC 81 MG tablet Take 81 mg by mouth every morning.     [name] atorvastatin (LIPITOR) 10 MG tablet TAKE 1 TABLET BY MOUTH EVERY DAY 90 tablet 0  . calcium carbonate-magnesium hydroxide (ROLAIDS) 334 MG CHEW Chew 1 tablet by mouth 3 (three) times daily with meals.     . Calcium Citrate-Vitamin D (CITRACAL + D PO) Take 1 tablet by mouth daily.    . Denosumab (XGEVA [place]) Inject into the skin every 30 (thirty) days. Injects "around the first part of each month."    . donepezil (ARICEPT) 10 MG tablet TAKE 1 TABLET BY MOUTH DAILY 90 tablet 3  . Glucosamine-Chondroitin (GLUCOSAMINE CHONDR COMPLEX) 500-400 MG CAPS Take by mouth daily.    [name] glucose blood (ACCU-CHEK AVIVA) test strip USE TO CHECK BLOOD SUGAR DAILY AND PRN 100 each 6  . Insulin Pen Needle (CAREFINE PEN NEEDLES) 32G X 4 MM MISC USE TO INJECT INSULIN DAILY 90 each 3  . leuprolide (LUPRON) 3.75 MG injection Inject 3.75 mg into the muscle every 4 (four) months.    . levothyroxine (SYNTHROID, LEVOTHROID) 50 MCG tablet TAKE 1 TABLET BY MOUTH DAILY 90 tablet 1  . Liraglutide 18 MG/3ML SOPN Inject 0.3 mLs (1.8 mg total) into the skin every morning. 6 mL 6  . LYRICA 50 MG capsule TAKE 2 CAPSULES BY MOUTH EVERY MORNING THEN TAKE 1 CAPSULE BY MOUTH EVERY EVENING 90 capsule 5  . metFORMIN (GLUCOPHAGE) 1000 MG tablet Take 1 tablet (1,000 mg total) by mouth daily with breakfast. 180 tablet 1  . NAMENDA XR 28 MG CP24 24 hr capsule TAKE 1 CAPSULE BY MOUTH DAILY 30 capsule 5  . nitroGLYCERIN (NITROSTAT) 0.4 MG SL tablet Place 0.4 mg under the tongue every 5 (five)  minutes as needed. For chest pain    . Nutritional Supplements (JUICE PLUS FIBRE PO) Take 6 capsules by mouth daily.     [name] omega-3 acid ethyl esters (LOVAZA) 1 G capsule Take 1 g  by mouth once a week.     . pioglitazone (ACTOS) 15 MG tablet TAKE 1 TABLET BY MOUTH DAILY 90 tablet 1  . traMADol (ULTRAM) 50 MG tablet TAKE 1 TABLET BY MOUTH EVERY 8 HOURS AS NEEDED 90 tablet 2  . VESICARE 10 MG tablet Take 5 mg by mouth daily.   11  . [DISCONTINUED] chlorthalidone (HYGROTON) 25 MG tablet Take 12.5 mg by mouth daily.     No current facility-administered medications on file prior to visit.    BP 156/80 mmHg  Pulse 66  Temp(Src) 97.6 F (36.4 C) (Oral)  Resp 20  Ht 5\' 7"  (1.702 m)  Wt 187 lb (84.823 kg)  BMI 29.28 kg/m2  SpO2 97%  '    Review of Systems  Constitutional: Positive for activity change, appetite change, fatigue and unexpected weight change. Negative for fever and chills.  HENT: Negative for congestion, dental problem, ear pain, hearing loss, sore throat, tinnitus, trouble swallowing and voice change.   Eyes: Negative for pain, discharge and visual disturbance.  Respiratory: Negative for cough, chest tightness, wheezing and stridor.   Cardiovascular: Negative for chest pain, palpitations and leg swelling.  Gastrointestinal: Negative for nausea, vomiting, abdominal pain, diarrhea, constipation, blood in stool and abdominal distention.  Genitourinary: Negative for urgency, hematuria, flank pain, discharge, difficulty urinating and genital sores.  Musculoskeletal: Positive for gait problem. Negative for myalgias, back pain, joint swelling, arthralgias and neck stiffness.  Skin: Negative for rash.  Neurological: Positive for weakness. Negative for dizziness, syncope, speech difficulty, numbness and headaches.  Hematological: Negative for adenopathy. Does not bruise/bleed easily.  Psychiatric/Behavioral: Positive for confusion and decreased concentration. Negative for behavioral  problems and dysphoric mood. The patient is not nervous/anxious.        Objective:   Physical Exam  Constitutional: He is oriented to person, place, and time. He appears well-developed.  Repeat blood pressure 130/78    HENT:  Head: Normocephalic.  Right Ear: External ear normal.  Left Ear: External ear normal.  Eyes: Conjunctivae and EOM are normal.  Neck: Normal range of motion.  Cardiovascular: Normal rate and normal heart sounds.   Pulmonary/Chest: Breath sounds normal.  Abdominal: Bowel sounds are normal.  Musculoskeletal: Normal range of motion. He exhibits no edema or tenderness.  Neurological: He is alert and oriented to person, place, and time.  Psychiatric: He has a normal mood and affect. His behavior is normal.          Assessment & Plan:   Progressive metastatic prostate cancer.  Patient now has anorexia, weight loss History of worsening renal indices.  Patient is scheduled for repeat lab in 2 days has been asked to force fluids and to increase  Intake. Diabetes mellitus.  Last hemoglobin A1c was tightly controlled.  We'll decrease glimepiride to 2 mg daily.  Hold metformin until repeat renal indices are available; last GFR was less than 30.  May also need to discontinue Victoza Recheck one month We'll discuss with patient/daughter further change in medical regimen in 2 days when laboratory studies are available.

## 2015-07-13 ENCOUNTER — Telehealth: Payer: Self-pay | Admitting: Internal Medicine

## 2015-07-13 ENCOUNTER — Ambulatory Visit (HOSPITAL_BASED_OUTPATIENT_CLINIC_OR_DEPARTMENT_OTHER): Payer: Medicare Other | Admitting: Oncology

## 2015-07-13 ENCOUNTER — Telehealth: Payer: Self-pay | Admitting: Oncology

## 2015-07-13 ENCOUNTER — Ambulatory Visit (HOSPITAL_BASED_OUTPATIENT_CLINIC_OR_DEPARTMENT_OTHER): Payer: Medicare Other

## 2015-07-13 VITALS — BP 143/56 | HR 60 | Temp 98.7°F | Resp 18 | Ht 67.0 in | Wt 187.5 lb

## 2015-07-13 DIAGNOSIS — C61 Malignant neoplasm of prostate: Secondary | ICD-10-CM

## 2015-07-13 DIAGNOSIS — C7951 Secondary malignant neoplasm of bone: Secondary | ICD-10-CM

## 2015-07-13 DIAGNOSIS — E291 Testicular hypofunction: Secondary | ICD-10-CM

## 2015-07-13 LAB — COMPREHENSIVE METABOLIC PANEL
ALT: 14 U/L (ref 0–55)
AST: 18 U/L (ref 5–34)
Albumin: 3.4 g/dL — ABNORMAL LOW (ref 3.5–5.0)
Alkaline Phosphatase: 226 U/L — ABNORMAL HIGH (ref 40–150)
Anion Gap: 10 mEq/L (ref 3–11)
BUN: 39.7 mg/dL — AB (ref 7.0–26.0)
CHLORIDE: 110 meq/L — AB (ref 98–109)
CO2: 19 meq/L — AB (ref 22–29)
CREATININE: 2.9 mg/dL — AB (ref 0.7–1.3)
Calcium: 8.5 mg/dL (ref 8.4–10.4)
EGFR: 19 mL/min/{1.73_m2} — ABNORMAL LOW (ref >90)
GLUCOSE: 322 mg/dL — AB (ref 70–140)
POTASSIUM: 4 meq/L (ref 3.5–5.1)
SODIUM: 139 meq/L (ref 136–145)
Total Bilirubin: 0.54 mg/dL (ref 0.20–1.20)
Total Protein: 7.2 g/dL (ref 6.4–8.3)

## 2015-07-13 LAB — CBC WITH DIFFERENTIAL/PLATELET
BASO%: 1 % (ref 0.0–2.0)
BASOS ABS: 0 10*3/uL (ref 0.0–0.1)
EOS%: 4.7 % (ref 0.0–7.0)
Eosinophils Absolute: 0.1 10*3/uL (ref 0.0–0.5)
HEMATOCRIT: 30.4 % — AB (ref 38.4–49.9)
HGB: 10.2 g/dL — ABNORMAL LOW (ref 13.0–17.1)
LYMPH#: 0.5 10*3/uL — AB (ref 0.9–3.3)
LYMPH%: 17.1 % (ref 14.0–49.0)
MCH: 31 pg (ref 27.2–33.4)
MCHC: 33.4 g/dL (ref 32.0–36.0)
MCV: 92.7 fL (ref 79.3–98.0)
MONO#: 0.4 10*3/uL (ref 0.1–0.9)
MONO%: 13 % (ref 0.0–14.0)
NEUT#: 2 10*3/uL (ref 1.5–6.5)
NEUT%: 64.2 % (ref 39.0–75.0)
Platelets: 160 10*3/uL (ref 140–400)
RBC: 3.29 10*6/uL — AB (ref 4.20–5.82)
RDW: 14.8 % — ABNORMAL HIGH (ref 11.0–14.6)
WBC: 3.1 10*3/uL — ABNORMAL LOW (ref 4.0–10.3)

## 2015-07-13 NOTE — Telephone Encounter (Signed)
Patients son called in regarding Education officer, museum. He states that there was a conversation at his last appointment to perhaps have him placed with a Education officer, museum for assisted living placement of in home nursing care. He would like to begin this process. Please follow up. Thanks so much

## 2015-07-13 NOTE — Progress Notes (Signed)
Hematology and Oncology Follow Up Visit  [name] VIELMAS [id number] Mar 06, 1930 82 y.o. 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 His initial diagnosis was in 2006 when he presented with a PSA of 6.26 the Gleason score 4+3 equals 7.  Prior Therapy:  He is status post definitive therapy with radiation and androgen deprivation completed in 2007. The patient developed recurrent disease in 2012 with a PSA rise up to 28 and T9 osseous metastasis and was treated with combined androgen deprivation with Lupron and Casodex. He had an excellent response with his PSA nadir down to 0.19. Most recently he developed a rise in his PSA up to 3.06 in July 2014 despite castrate levels of testosterone. His bone scan in July 2014 confirmed the presence of bony disease. Zytiga 1000 mg daily started in July 2014 till July 2015.  Xtandi 160 mg daily started in July of 2015 to April 2016. Xofigo started on 11/03/2014. He is status post 6 injections completed in October 2016.  Current therapy:  He receives [place] monthly at [facility] urology. He receives Lupron every 4 months at [facility] urology. He will be on supportive care only not a candidate for any further therapy.  Interim History:  [name] [name] returns today for a followup visit with his son and daughter. Since the last visit, he continues to have gradual decline with issues regarding his memory and overall appetite. He lost close to 6 pounds as he is forgetting to eat and forgetting to take his medication. He reports symptoms of knee pain and hip pain which is not changed dramatically. Overall he is able to ambulate without any major difficulties. He does use a cane but did not report any falls or syncope. According to his family, his memory is becoming more and more of an issue which have caused further decline  in his health.   He does not report any headaches, blurry vision, syncope or seizures. Does not report any fevers, chills, sweats or weight loss. Does not report any chest pain, palpitation orthopnea. He does not report any lower extremity edema or difficulty breathing. No abdominal pain, nausea, vomiting. Has not reported any of the fluid retention or any bone pain. Did not report any genitourinary bleeding or hemoptysis. Remainder of his review of systems unremarkable.  Medications: I have reviewed the patient's current medications.  Current Outpatient Prescriptions  Medication Sig Dispense Refill  . acetaminophen (TYLENOL) 500 MG tablet Take 1,000 mg by mouth every 6 (six) hours as needed for pain.    [name] amLODipine-valsartan (EXFORGE) 10-320 MG per tablet TAKE 1/2 TABLET BY MOUTH EVERY DAY 90 tablet 1  . aspirin EC 81 MG tablet Take 81 mg by mouth every morning.     [name] atorvastatin (LIPITOR) 10 MG tablet TAKE 1 TABLET BY MOUTH EVERY DAY 90 tablet 0  . calcium carbonate-magnesium hydroxide (ROLAIDS) 334 MG CHEW Chew 1 tablet by mouth 3 (three) times daily with meals.     . Calcium Citrate-Vitamin D (CITRACAL + D PO) Take 1 tablet by mouth daily.    . Denosumab (XGEVA [place]) Inject into the skin every 30 (thirty) days. Injects "around the first part of each month."    . donepezil (ARICEPT) 10 MG tablet TAKE 1 TABLET BY MOUTH DAILY 90 tablet 3  . glimepiride (AMARYL) 4 MG tablet Take 0.5 tablets (2 mg total) by mouth daily with  breakfast. 30 tablet 5  . glucose blood (ACCU-CHEK AVIVA) test strip USE TO CHECK BLOOD SUGAR DAILY AND PRN 100 each 6  . Insulin Pen Needle (CAREFINE PEN NEEDLES) 32G X 4 MM MISC USE TO INJECT INSULIN DAILY 90 each 3  . leuprolide (LUPRON) 3.75 MG injection Inject 3.75 mg into the muscle every 4 (four) months.    . levothyroxine (SYNTHROID, LEVOTHROID) 50 MCG tablet TAKE 1 TABLET BY MOUTH DAILY 90 tablet 1  . Liraglutide 18 MG/3ML SOPN Inject 0.3 mLs (1.8 mg total) into the  skin every morning. 6 mL 6  . LYRICA 50 MG capsule TAKE 2 CAPSULES BY MOUTH EVERY MORNING THEN TAKE 1 CAPSULE BY MOUTH EVERY EVENING 90 capsule 5  . NAMENDA XR 28 MG CP24 24 hr capsule TAKE 1 CAPSULE BY MOUTH DAILY 30 capsule 5  . Nutritional Supplements (JUICE PLUS FIBRE PO) Take 6 capsules by mouth daily.     [name] omega-3 acid ethyl esters (LOVAZA) 1 G capsule Take 1 g by mouth once a week.     . pioglitazone (ACTOS) 15 MG tablet TAKE 1 TABLET BY MOUTH DAILY 90 tablet 1  . traMADol (ULTRAM) 50 MG tablet TAKE 1 TABLET BY MOUTH EVERY 8 HOURS AS NEEDED 90 tablet 2  . VESICARE 10 MG tablet Take 5 mg by mouth daily.   11  . metFORMIN (GLUCOPHAGE) 1000 MG tablet Take 1 tablet (1,000 mg total) by mouth daily with breakfast. (Patient not taking: Reported on 07/13/2015) 180 tablet 1  . nitroGLYCERIN (NITROSTAT) 0.4 MG SL tablet Place 0.4 mg under the tongue every 5 (five) minutes as needed. Reported on 07/13/2015    . [DISCONTINUED] chlorthalidone (HYGROTON) 25 MG tablet Take 12.5 mg by mouth daily.     No current facility-administered medications for this visit.     Allergies:  Allergies  Allergen Reactions  . Codeine Phosphate Other (See Comments)    mouth sores    Past Medical History, Surgical history, Social history, and Family History were reviewed and updated.    Physical Exam: Blood pressure 143/56, pulse 60, temperature 98.7 F (37.1 C), temperature source Oral, resp. rate 18, height 5\' 7"  (1.702 m), weight 187 lb 8 oz (85.049 kg), SpO2 97 %. ECOG: 1 General appearance: alert and cooperative elderly gentleman who appeared without distress. Head: Normocephalic, without obvious abnormality no oral ulcers or lesions. Neck: no adenopathy Lymph nodes: Cervical, supraclavicular, and axillary nodes normal. Heart:regular rate and rhythm, S1, S2 normal, no murmur, click, rub or gallop  Lung:chest clear, no wheezing, rales.  Abdomen: soft, non-tender, without masses or organomegaly. No  ascites. EXT:no erythema, induration, or nodules.  Neurological examination: No deficits.   Lab Results: Lab Results  Component Value Date   WBC 3.1* 07/13/2015   HGB 10.2* 07/13/2015   HCT 30.4* 07/13/2015   MCV 92.7 07/13/2015   PLT 160 07/13/2015     Chemistry      Component Value Date/Time   NA 139 06/14/2015 1121   NA 137 02/22/2014 1026   K 4.4 06/14/2015 1121   K 4.3 02/22/2014 1026   CL 101 02/22/2014 1026   CO2 21* 06/14/2015 1121   CO2 26 02/22/2014 1026   BUN 36.9* 06/14/2015 1121   BUN 27* 02/22/2014 1026   CREATININE 2.6* 06/14/2015 1121   CREATININE 1.5 02/22/2014 1026   CREATININE 1.38* 02/11/2012 1614      Component Value Date/Time   CALCIUM 9.4 06/14/2015 1121   CALCIUM 10.1 02/22/2014 1026  ALKPHOS 117 06/14/2015 1121   ALKPHOS 54 05/07/2013 1522   AST 17 06/14/2015 1121   AST 22 05/07/2013 1522   ALT 11 06/14/2015 1121   ALT 20 05/07/2013 1522   BILITOT 0.54 06/14/2015 1121   BILITOT 0.7 05/07/2013 1522     Lab Results  Component Value Date   PSA 49.67* 06/14/2015   PSA 17.94* 04/07/2015   PSA 15.92* 03/02/2015     Impression and Plan: This is an 82 year old gentleman with the following issues: 1. 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 His initial diagnosis dates back to 2006 with PSA of 6.26 and a Gleason score of 7. He progressed on [place].   He is S/P Xofigo and have tolerated it well. He received a total 6 treatments completed in October 2016. His PSA had increased up to 49 and bone scan showed progression of disease which was discussed with the family on his previous visit.  These options were reviewed again which includes chemotherapy versus supportive care only. Given his decline in his health predominantly related to worsening dementia, I feel would be very difficult to treat him with chemotherapy at this time. The risks outweigh the benefits at this time given his overall decline.  I have  recommended supportive care only and hospice enrollment. It would be reasonable also to contemplate the possibility of skilled nursing facility placement especially at a facility that is equipped with memory care.  For the time being we will withhold any further treatment for his prostate cancer and address his issues as they arise.    2. Bony disease: He is receiving Xgeva at [facility] urology on a monthly basis. I have recommended stopping this in the near future.  3. Hormonal deprivation: He is receiving Lupron at [facility] urology every 4 months. I have recommended stopping these in the near future as well.  4. Advanced directives and end-of-life care: I discussed with him the poor prognosis associated with end-stage prostate cancer especially in the setting of worsening dementia. I think he has limited life expectancy of less than 6 months. I have urged him to make sure that he completes his living will as well as power of attorney assignment. I also have recommended no CODE BLUE status in case of any hospitalization.  5. Followup: He will return 4 weeks for an evaluation. He will cancel this appointment if needed to in the future.  Zola Button, MD 1/12/20174:14 PM

## 2015-07-13 NOTE — Telephone Encounter (Signed)
Discussed message with Dr. Raliegh Ip, okay to do referral to Hospice. Order for Hospice done  Left message on voicemail to call office.

## 2015-07-13 NOTE — Telephone Encounter (Signed)
Gave relative avs report and appointments for March.  °

## 2015-07-14 ENCOUNTER — Other Ambulatory Visit: Payer: Self-pay

## 2015-07-14 ENCOUNTER — Encounter (HOSPITAL_COMMUNITY): Payer: Self-pay | Admitting: Emergency Medicine

## 2015-07-14 ENCOUNTER — Telehealth: Payer: Self-pay | Admitting: Internal Medicine

## 2015-07-14 ENCOUNTER — Inpatient Hospital Stay (HOSPITAL_COMMUNITY)
Admission: EM | Admit: 2015-07-14 | Discharge: 2015-07-18 | DRG: 683 | Disposition: A | Discharge status: Hospice | Payer: Medicare Other | Attending: Internal Medicine | Admitting: Internal Medicine

## 2015-07-14 DIAGNOSIS — F0390 Unspecified dementia without behavioral disturbance: Secondary | ICD-10-CM | POA: Diagnosis present

## 2015-07-14 DIAGNOSIS — Z794 Long term (current) use of insulin: Secondary | ICD-10-CM

## 2015-07-14 DIAGNOSIS — K589 Irritable bowel syndrome without diarrhea: Secondary | ICD-10-CM | POA: Diagnosis present

## 2015-07-14 DIAGNOSIS — E039 Hypothyroidism, unspecified: Secondary | ICD-10-CM | POA: Diagnosis present

## 2015-07-14 DIAGNOSIS — E860 Dehydration: Secondary | ICD-10-CM | POA: Diagnosis present

## 2015-07-14 DIAGNOSIS — Z823 Family history of stroke: Secondary | ICD-10-CM

## 2015-07-14 DIAGNOSIS — Z79899 Other long term (current) drug therapy: Secondary | ICD-10-CM

## 2015-07-14 DIAGNOSIS — F329 Major depressive disorder, single episode, unspecified: Secondary | ICD-10-CM | POA: Diagnosis present

## 2015-07-14 DIAGNOSIS — I129 Hypertensive chronic kidney disease with stage 1 through stage 4 chronic kidney disease, or unspecified chronic kidney disease: Secondary | ICD-10-CM | POA: Diagnosis present

## 2015-07-14 DIAGNOSIS — C61 Malignant neoplasm of prostate: Secondary | ICD-10-CM | POA: Diagnosis present

## 2015-07-14 DIAGNOSIS — N1330 Unspecified hydronephrosis: Secondary | ICD-10-CM | POA: Diagnosis present

## 2015-07-14 DIAGNOSIS — M1990 Unspecified osteoarthritis, unspecified site: Secondary | ICD-10-CM | POA: Diagnosis present

## 2015-07-14 DIAGNOSIS — C7951 Secondary malignant neoplasm of bone: Secondary | ICD-10-CM | POA: Diagnosis present

## 2015-07-14 DIAGNOSIS — R413 Other amnesia: Secondary | ICD-10-CM | POA: Diagnosis present

## 2015-07-14 DIAGNOSIS — K219 Gastro-esophageal reflux disease without esophagitis: Secondary | ICD-10-CM | POA: Diagnosis present

## 2015-07-14 DIAGNOSIS — N183 Chronic kidney disease, stage 3 unspecified: Secondary | ICD-10-CM | POA: Diagnosis present

## 2015-07-14 DIAGNOSIS — E1122 Type 2 diabetes mellitus with diabetic chronic kidney disease: Secondary | ICD-10-CM | POA: Diagnosis present

## 2015-07-14 DIAGNOSIS — R944 Abnormal results of kidney function studies: Secondary | ICD-10-CM | POA: Diagnosis present

## 2015-07-14 DIAGNOSIS — Z66 Do not resuscitate: Secondary | ICD-10-CM | POA: Diagnosis present

## 2015-07-14 DIAGNOSIS — Z955 Presence of coronary angioplasty implant and graft: Secondary | ICD-10-CM | POA: Diagnosis not present

## 2015-07-14 DIAGNOSIS — E785 Hyperlipidemia, unspecified: Secondary | ICD-10-CM | POA: Diagnosis present

## 2015-07-14 DIAGNOSIS — Z951 Presence of aortocoronary bypass graft: Secondary | ICD-10-CM

## 2015-07-14 DIAGNOSIS — I2510 Atherosclerotic heart disease of native coronary artery without angina pectoris: Secondary | ICD-10-CM | POA: Diagnosis present

## 2015-07-14 DIAGNOSIS — D638 Anemia in other chronic diseases classified elsewhere: Secondary | ICD-10-CM | POA: Diagnosis present

## 2015-07-14 DIAGNOSIS — E1142 Type 2 diabetes mellitus with diabetic polyneuropathy: Secondary | ICD-10-CM | POA: Diagnosis present

## 2015-07-14 DIAGNOSIS — Z885 Allergy status to narcotic agent status: Secondary | ICD-10-CM

## 2015-07-14 DIAGNOSIS — Z8679 Personal history of other diseases of the circulatory system: Secondary | ICD-10-CM

## 2015-07-14 DIAGNOSIS — C49A0 Gastrointestinal stromal tumor, unspecified site: Secondary | ICD-10-CM | POA: Diagnosis present

## 2015-07-14 DIAGNOSIS — Z87891 Personal history of nicotine dependence: Secondary | ICD-10-CM

## 2015-07-14 DIAGNOSIS — N184 Chronic kidney disease, stage 4 (severe): Secondary | ICD-10-CM

## 2015-07-14 DIAGNOSIS — D6489 Other specified anemias: Secondary | ICD-10-CM | POA: Diagnosis present

## 2015-07-14 DIAGNOSIS — I10 Essential (primary) hypertension: Secondary | ICD-10-CM

## 2015-07-14 DIAGNOSIS — Z7982 Long term (current) use of aspirin: Secondary | ICD-10-CM

## 2015-07-14 DIAGNOSIS — F32A Depression, unspecified: Secondary | ICD-10-CM | POA: Diagnosis present

## 2015-07-14 DIAGNOSIS — D214 Benign neoplasm of connective and other soft tissue of abdomen: Secondary | ICD-10-CM | POA: Diagnosis present

## 2015-07-14 DIAGNOSIS — N179 Acute kidney failure, unspecified: Secondary | ICD-10-CM | POA: Diagnosis present

## 2015-07-14 DIAGNOSIS — E0842 Diabetes mellitus due to underlying condition with diabetic polyneuropathy: Secondary | ICD-10-CM | POA: Diagnosis present

## 2015-07-14 DIAGNOSIS — N529 Male erectile dysfunction, unspecified: Secondary | ICD-10-CM | POA: Diagnosis present

## 2015-07-14 HISTORY — DX: Disorder of kidney and ureter, unspecified: N28.9

## 2015-07-14 LAB — COMPREHENSIVE METABOLIC PANEL
ALBUMIN: 3.4 g/dL — AB (ref 3.5–5.0)
ALT: 18 U/L (ref 17–63)
ANION GAP: 9 (ref 5–15)
AST: 21 U/L (ref 15–41)
Alkaline Phosphatase: 177 U/L — ABNORMAL HIGH (ref 38–126)
BILIRUBIN TOTAL: 0.9 mg/dL (ref 0.3–1.2)
BUN: 42 mg/dL — ABNORMAL HIGH (ref 6–20)
CO2: 19 mmol/L — ABNORMAL LOW (ref 22–32)
Calcium: 7.9 mg/dL — ABNORMAL LOW (ref 8.9–10.3)
Chloride: 110 mmol/L (ref 101–111)
Creatinine, Ser: 2.82 mg/dL — ABNORMAL HIGH (ref 0.61–1.24)
GFR calc Af Amer: 22 mL/min — ABNORMAL LOW (ref >60)
GFR, EST NON AFRICAN AMERICAN: 19 mL/min — AB (ref >60)
Glucose, Bld: 109 mg/dL — ABNORMAL HIGH (ref 65–99)
POTASSIUM: 3.8 mmol/L (ref 3.5–5.1)
Sodium: 138 mmol/L (ref 135–145)
TOTAL PROTEIN: 6.5 g/dL (ref 6.5–8.1)

## 2015-07-14 LAB — CBG MONITORING, ED: GLUCOSE-CAPILLARY: 137 mg/dL — AB (ref 65–99)

## 2015-07-14 LAB — CBC
HEMATOCRIT: 27.8 % — AB (ref 39.0–52.0)
HEMOGLOBIN: 9.3 g/dL — AB (ref 13.0–17.0)
MCH: 31 pg (ref 26.0–34.0)
MCHC: 33.5 g/dL (ref 30.0–36.0)
MCV: 92.7 fL (ref 78.0–100.0)
Platelets: 161 10*3/uL (ref 150–400)
RBC: 3 MIL/uL — ABNORMAL LOW (ref 4.22–5.81)
RDW: 14.3 % (ref 11.5–15.5)
WBC: 3.3 10*3/uL — AB (ref 4.0–10.5)

## 2015-07-14 LAB — PSA (PARALLEL TESTING): PSA: 81.72 ng/mL — AB (ref <=4.00)

## 2015-07-14 LAB — PSA: Prostate Specific Ag, Serum: 78.1 ng/mL — ABNORMAL HIGH (ref 0.0–4.0)

## 2015-07-14 MED ORDER — SODIUM CHLORIDE 0.9 % IV BOLUS (SEPSIS)
500.0000 mL | Freq: Once | INTRAVENOUS | Status: AC
  Administered 2015-07-14: 500 mL via INTRAVENOUS

## 2015-07-14 MED ORDER — DONEPEZIL HCL 10 MG PO TABS
10.0000 mg | ORAL_TABLET | Freq: Every day | ORAL | Status: DC
  Administered 2015-07-15 – 2015-07-18 (×4): 10 mg via ORAL
  Filled 2015-07-14 (×4): qty 1

## 2015-07-14 MED ORDER — LEVOTHYROXINE SODIUM 50 MCG PO TABS
50.0000 ug | ORAL_TABLET | Freq: Every day | ORAL | Status: DC
  Administered 2015-07-15 – 2015-07-18 (×4): 50 ug via ORAL
  Filled 2015-07-14 (×4): qty 1

## 2015-07-14 MED ORDER — ATORVASTATIN CALCIUM 10 MG PO TABS
10.0000 mg | ORAL_TABLET | Freq: Every day | ORAL | Status: DC
  Administered 2015-07-15 – 2015-07-17 (×3): 10 mg via ORAL
  Filled 2015-07-14 (×4): qty 1

## 2015-07-14 MED ORDER — DARIFENACIN HYDROBROMIDE ER 7.5 MG PO TB24
7.5000 mg | ORAL_TABLET | Freq: Every day | ORAL | Status: DC
  Administered 2015-07-15 – 2015-07-18 (×4): 7.5 mg via ORAL
  Filled 2015-07-14 (×4): qty 1

## 2015-07-14 MED ORDER — MEMANTINE HCL ER 28 MG PO CP24
28.0000 mg | ORAL_CAPSULE | Freq: Every day | ORAL | Status: DC
  Administered 2015-07-15 – 2015-07-18 (×4): 28 mg via ORAL
  Filled 2015-07-14 (×4): qty 1

## 2015-07-14 MED ORDER — CALCIUM CARBONATE ANTACID 500 MG PO CHEW: 1.0000 | CHEWABLE_TABLET | Freq: Three times a day (TID) | ORAL | Status: DC | PRN

## 2015-07-14 MED ORDER — ASPIRIN EC 81 MG PO TBEC
81.0000 mg | DELAYED_RELEASE_TABLET | Freq: Every morning | ORAL | Status: DC
  Administered 2015-07-15 – 2015-07-18 (×4): 81 mg via ORAL
  Filled 2015-07-14 (×4): qty 1

## 2015-07-14 MED ORDER — JUICE PLUS FIBRE PO LIQD: 6.0000 | Freq: Every day | ORAL | Status: DC

## 2015-07-14 MED ORDER — HEPARIN SODIUM (PORCINE) 5000 UNIT/ML IJ SOLN
5000.0000 [IU] | Freq: Three times a day (TID) | INTRAMUSCULAR | Status: DC
  Administered 2015-07-15 – 2015-07-18 (×10): 5000 [IU] via SUBCUTANEOUS
  Filled 2015-07-14 (×11): qty 1

## 2015-07-14 MED ORDER — INSULIN ASPART 100 UNIT/ML ~~LOC~~ SOLN
0.0000 [IU] | Freq: Three times a day (TID) | SUBCUTANEOUS | Status: DC
  Administered 2015-07-15: 2 [IU] via SUBCUTANEOUS
  Administered 2015-07-15 (×2): 1 [IU] via SUBCUTANEOUS
  Administered 2015-07-16 – 2015-07-17 (×4): 2 [IU] via SUBCUTANEOUS
  Administered 2015-07-17: 1 [IU] via SUBCUTANEOUS
  Administered 2015-07-17 – 2015-07-18 (×2): 2 [IU] via SUBCUTANEOUS

## 2015-07-14 MED ORDER — AMLODIPINE BESYLATE 10 MG PO TABS
10.0000 mg | ORAL_TABLET | Freq: Every day | ORAL | Status: DC
  Administered 2015-07-15 – 2015-07-18 (×4): 10 mg via ORAL
  Filled 2015-07-14 (×4): qty 1

## 2015-07-14 MED ORDER — TRAMADOL HCL 50 MG PO TABS: 50.0000 mg | ORAL_TABLET | Freq: Three times a day (TID) | ORAL | Status: DC | PRN

## 2015-07-14 MED ORDER — SODIUM CHLORIDE 0.9 % IV SOLN
INTRAVENOUS | Status: DC
  Administered 2015-07-15 – 2015-07-18 (×6): via INTRAVENOUS

## 2015-07-14 MED ORDER — SODIUM CHLORIDE 0.9 % IJ SOLN
3.0000 mL | Freq: Two times a day (BID) | INTRAMUSCULAR | Status: DC
  Administered 2015-07-15 – 2015-07-16 (×2): 3 mL via INTRAVENOUS

## 2015-07-14 MED ORDER — NITROGLYCERIN 0.4 MG SL SUBL: 0.4000 mg | SUBLINGUAL_TABLET | SUBLINGUAL | Status: DC | PRN

## 2015-07-14 MED ORDER — ACETAMINOPHEN 325 MG PO TABS: 650.0000 mg | ORAL_TABLET | Freq: Four times a day (QID) | ORAL | Status: DC | PRN

## 2015-07-14 NOTE — Telephone Encounter (Signed)
Spoke to [name] [name], told him just wanted to let you know that a referral was done for Hospice and they will be contacting you. [name] said they already have and they have an appointment for Tuesday. Told him okay good.

## 2015-07-14 NOTE — Telephone Encounter (Signed)
Reviewed lab draw from hematology/oncology visit yesterday.  This revealed worsening renal failure with significant hyperglycemia. Discussed with daughter who will transport the patient to the ED for evaluation and hospital admission to further manage renal failure, dehydration. The family is aware that all oral diabetic medications with this degree of renal dysfunction are ineffective and potentially toxic.  The patient will need to be started on insulin therapy for diabetic control. The patient is also on an ARB which will need to be discontinued and blood pressure monitored

## 2015-07-14 NOTE — Progress Notes (Unsigned)
   Subjective:    Patient ID: [name] [name], male    DOB: Jan 12, 1930, 82 y.o.   MRN: [id number]  HPI    Review of Systems     Objective:   Physical Exam        Assessment & Plan:

## 2015-07-14 NOTE — ED Notes (Signed)
Per pt's family, states history of cancer-had blood work done yesterday-was told to come here for abnormal labs and elevated sugar

## 2015-07-14 NOTE — ED Provider Notes (Signed)
CSN: [id number]     Arrival date & time 07/14/15  1802 History   First MD Initiated Contact with Patient 07/14/15 2047     Chief Complaint  Patient presents with  . Acute Renal Failure    Level 5 caveat due to dementia The history is provided by the patient.   patient was sent in by his primary care doctor for admission and evaluation in the ER. He has a history of metastatic prostate cancer. It is castration resistant. It is been worsening and per the oncology notes he has likely under 6 months to live. He does have a history of dementia and has been doing worse recently. He's been forgetting to eat and do other daily activities. His creatinine has increased and his sugars have been going on also. Has a history of diabetes. Per the note he will need to be taken off his oral medications and started on insulin. Denies dysuria. Patient states he is feeling fine patient's wife states that he is not doing well. He denies any urinary problems.  Past Medical History  Diagnosis Date  . Hypertension   . Hyperlipidemia   . Memory loss     mild  . Rectal ulcer     RESOLVED  . Constipation   . History of ventricular fibrillation 1998    while on treadmill  . Hypothyroidism   . IBS (irritable bowel syndrome)   . Colitis     RESOLVED  . ED (erectile dysfunction)   . Osteoporosis   . Depression   . Diabetes mellitus     fasting blood sugar 140-200 usually  . GERD (gastroesophageal reflux disease)   . Heart attack ([place]) 2009  . Dementia   . Neuromuscular disorder ([place])     DIABETIC NEUROPATHY  . Prostate cancer ([place]) 2007  . Pain     LOWER BACK  -- PAST HX OF STEROID EPIDURALS FOR PAIN RELIEF  . 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 CAD (coronary artery disease)     a. S/P CABG x 5 1998; b.  S/P stent LCX 2009;  c.  LHC 5/09: RCA proximal 90%, 95%,  distally occluded, pCFX stents patent, OM1 30%, small left atrial branch 95%, oLAD occluded, S-dRCA patent, L-LAD/Dx patent, EF 60%=> Med Rx  ;  d.  Lex MV 3/13:  EF 63%, no ischemia  e.  Lex Myoview 03/08/13:  EF 56%, inf and apical defect suggestive of thinning and soft tissue attenuation, no ischemia; low risk.    . Bone metastases ([place])   . S/P radiation therapy 07/17/2005 - 09/10/2005    Prostate 7800 cGy, 40 Sessions  . Cataract     bil eyes  . Renal disorder    Past Surgical History  Procedure Laterality Date  . Coronary artery bypass graft  1998    x5, with a left internal mammary to the LAD and diagonal, vein graft to the OM1 and 2, and a vein graft to the right coronary artery  . Coronary stent placement  11/13/2007    LCX  . Trigger finger repaired  2009  . Laparoscopic gastrectomy N/A 08/18/2012    Procedure: Laparoscopic Partial Gastrectomy;  Surgeon: [name], MD;  Location: [facility] ORS;  Service: General;  Laterality: N/A;  Laparoscopic Partial Gastrectomy,  . Colonoscopy    . Stomach surgery    . Upper gastrointestinal endoscopy  Family History  Problem Relation Age of Onset  . Stroke Father   . Pneumonia Father   . Colon cancer Neg Hx   . Esophageal cancer Neg Hx   . Rectal cancer Neg Hx   . Stomach cancer Neg Hx    Social History  Substance Use Topics  . Smoking status: Former Research scientist (life sciences)  . Smokeless tobacco: Former Systems developer    Quit date: 02/11/1948  . Alcohol Use: No    Review of Systems  Unable to perform ROS: Dementia      Allergies  Codeine phosphate  Home Medications   Prior to Admission medications   Medication Sig Start Date End Date Taking? Authorizing Provider  acetaminophen (TYLENOL) 500 MG tablet Take 1,000 mg by mouth every 6 (six) hours as needed for pain.   Yes Historical Provider, MD  amLODipine-valsartan (EXFORGE) 10-320 MG per tablet TAKE 1/2 TABLET BY MOUTH EVERY DAY 10/05/14  Yes Marletta Lor, MD  aspirin EC 81 MG tablet Take 81 mg  by mouth every morning.    Yes Historical Provider, MD  atorvastatin (LIPITOR) 10 MG tablet TAKE 1 TABLET BY MOUTH EVERY DAY 03/16/15  Yes Marletta Lor, MD  calcium carbonate-magnesium hydroxide (ROLAIDS) 334 MG CHEW Chew 1 tablet by mouth 3 (three) times daily as needed (indigestion/heartburn).    Yes Historical Provider, MD  Calcium Citrate-Vitamin D (CITRACAL + D PO) Take 1 tablet by mouth daily.   Yes Historical Provider, MD  Denosumab (XGEVA [place]) Inject into the skin every 30 (thirty) days. Injects "around the first part of each month."   Yes Historical Provider, MD  donepezil (ARICEPT) 10 MG tablet TAKE 1 TABLET BY MOUTH DAILY 02/10/15  Yes Marletta Lor, MD  glimepiride (AMARYL) 4 MG tablet Take 0.5 tablets (2 mg total) by mouth daily with breakfast. 07/11/15  Yes Marletta Lor, MD  glucose blood (ACCU-CHEK AVIVA) test strip USE TO CHECK BLOOD SUGAR DAILY AND PRN 05/04/15  Yes Marletta Lor, MD  Insulin Pen Needle (CAREFINE PEN NEEDLES) 32G X 4 MM MISC USE TO INJECT INSULIN DAILY 01/10/15  Yes Marletta Lor, MD  leuprolide (LUPRON) 3.75 MG injection Inject 3.75 mg into the muscle every 4 (four) months.   Yes Historical Provider, MD  levothyroxine (SYNTHROID, LEVOTHROID) 50 MCG tablet TAKE 1 TABLET BY MOUTH DAILY 01/27/15  Yes Marletta Lor, MD  Liraglutide 18 MG/3ML SOPN Inject 0.3 mLs (1.8 mg total) into the skin every morning. 02/02/15  Yes Marletta Lor, MD  LYRICA 50 MG capsule TAKE 2 CAPSULES BY MOUTH EVERY MORNING THEN TAKE 1 CAPSULE BY MOUTH EVERY EVENING 12/23/14  Yes Star Age, MD  NAMENDA XR 28 MG CP24 24 hr capsule TAKE 1 CAPSULE BY MOUTH DAILY 04/28/15  Yes Marletta Lor, MD  Nutritional Supplements (JUICE PLUS FIBRE PO) Take 6 capsules by mouth daily.    Yes Historical Provider, MD  pioglitazone (ACTOS) 15 MG tablet TAKE 1 TABLET BY MOUTH DAILY 05/30/15  Yes Marletta Lor, MD  traMADol (ULTRAM) 50 MG tablet TAKE 1 TABLET BY MOUTH  EVERY 8 HOURS AS NEEDED Patient taking differently: TAKE 1 TABLET BY MOUTH EVERY 8 HOURS AS NEEDED for pain   Yes Marletta Lor, MD  VESICARE 10 MG tablet Take 10 mg by mouth daily.  09/05/14  Yes Historical Provider, MD  metFORMIN (GLUCOPHAGE) 1000 MG tablet Take 1 tablet (1,000 mg total) by mouth daily with breakfast. Patient not taking: Reported on 07/13/2015 10/05/14  Marletta Lor, MD  nitroGLYCERIN (NITROSTAT) 0.4 MG SL tablet Place 0.4 mg under the tongue every 5 (five) minutes as needed. Reported on 07/14/2015    Historical Provider, MD   BP 141/84 mmHg  Pulse 61  Temp(Src) 97.4 F (36.3 C) (Oral)  Resp 16  SpO2 100% Physical Exam  Constitutional: He appears well-developed.  HENT:  Head: Atraumatic.  Eyes: Pupils are equal, round, and reactive to light.  Cardiovascular: Normal rate.   Pulmonary/Chest: Effort normal.  Abdominal: Soft. There is no tenderness.  Musculoskeletal: Normal range of motion.  Neurological: He is alert.  Mild confusion.  Skin: Skin is warm.    ED Course  Procedures (including critical care time) Labs Review Labs Reviewed  CBC - Abnormal; Notable for the following:    WBC 3.3 (*)    RBC 3.00 (*)    Hemoglobin 9.3 (*)    HCT 27.8 (*)    All other components within normal limits  COMPREHENSIVE METABOLIC PANEL - Abnormal; Notable for the following:    CO2 19 (*)    Glucose, Bld 109 (*)    BUN 42 (*)    Creatinine, Ser 2.82 (*)    Calcium 7.9 (*)    Albumin 3.4 (*)    Alkaline Phosphatase 177 (*)    GFR calc non Af Amer 19 (*)    GFR calc Af Amer 22 (*)    All other components within normal limits  CBG MONITORING, ED - Abnormal; Notable for the following:    Glucose-Capillary 137 (*)    All other components within normal limits  URINALYSIS, ROUTINE W REFLEX MICROSCOPIC (NOT AT [facility])  CREATININE, URINE, RANDOM  SODIUM, URINE, RANDOM  PROTIME-INR  BASIC METABOLIC PANEL  CBC    Imaging Review No results found. I have  personally reviewed and evaluated these images and lab results as part of my medical decision-making.   EKG Interpretation None      MDM   Final diagnoses:  Acute renal failure superimposed on stage 3 chronic kidney disease (HCC)  Bone metastases ([place])    Patient with metastatic prostate cancer. Doing worse at home. Worsening renal function. Sent in by primary care doctor for admission.    Davonna Belling, MD 07/15/15 [id number]

## 2015-07-14 NOTE — H&P (Addendum)
[facility] History and Physical  [name] [name] [id number] DOB: 07-08-1929 DOA: 07/14/2015  Referring physician: ED physician PCP: [name] Cowden, MD  Specialists:   Chief Complaint: abnormal lab with worsening renal function  HPI: [name] [name] is a 82 y.o. male with PMH of metastasized end stage of prostate cancer, CAD, s/p of CABG and stent placement, IBS, colitis, dementia, GIST, chronic kidney disease-status 3, who presents with abnormal lab with worsening renal function.  Patient has metastasized prostate cancer. He has been followed up by [name] [name]. Per [name] [name] note on 07/13/15, his initial diagnosis dates back to 2006. He progressed on chemo recently. His PSA had increased and bone scan showed progression of disease on chemo. He is receiving Xgeva at [facility] urology on a monthly basis. [name] [name] recommended stopping this in the near future. [name] [name] recommended supportive care only, hospice enrollment and possible SNF placement. Per his daughter, patient has an appointment with hospice team on 07/18/15. Daughter reports that patient had blood work on Thursday, which showed worsening renal function, and pt is advised by his doctor to come to the emergency room for further eval and treatment today. Daughter states that the patient is a slightly more confused than and baseline. Patient does not have chest pain, abdominal pain, symptoms of UTI, unilateral weakness, nausea, vomiting, fever, chills.  In ED, patient was found to have worsening renal function with creatinine up from a baseline 1.5 1.9-2.9, BUN is 39.7, WBC is 3.3, temperature normal, no tachycardia, pending urinalysis. Patient's and admitted to inpatient for further eval and treatment.  EKG: Not done in ED, will get one.   Where does patient live?   At home Can patient participate in ADLs?   None  Review of Systems:   General: no fevers, chills, no changes in body weight, has poor appetite, has  fatigue HEENT: no blurry vision, hearing changes or sore throat Pulm: no dyspnea, coughing, wheezing CV: no chest pain, palpitations Abd: no nausea, vomiting, abdominal pain, diarrhea, constipation GU: no dysuria, burning on urination, increased urinary frequency, hematuria  Ext: no leg edema Neuro: no unilateral weakness, numbness, or tingling, no vision change or hearing loss Skin: no rash MSK: No muscle spasm, no deformity, no limitation of range of movement in spin Heme: No easy bruising.  Travel history: No recent long distant travel.  Allergy:  Allergies  Allergen Reactions  . Codeine Phosphate Other (See Comments)    mouth sores    Past Medical History  Diagnosis Date  . Hypertension   . Hyperlipidemia   . Memory loss     mild  . Rectal ulcer     RESOLVED  . Constipation   . History of ventricular fibrillation 1998    while on treadmill  . Hypothyroidism   . IBS (irritable bowel syndrome)   . Colitis     RESOLVED  . ED (erectile dysfunction)   . Osteoporosis   . Depression   . Diabetes mellitus     fasting blood sugar 140-200 usually  . GERD (gastroesophageal reflux disease)   . Heart attack ([place]) 2009  . Dementia   . Neuromuscular disorder ([place])     DIABETIC NEUROPATHY  . Prostate cancer ([place]) 2007  . Pain     LOWER BACK  -- PAST HX OF STEROID EPIDURALS FOR PAIN RELIEF  . 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  CAD (coronary artery disease)     a. S/P CABG x 5 1998; b.  S/P stent LCX 2009;  c.  LHC 5/09: RCA proximal 90%, 95%, distally occluded, pCFX stents patent, OM1 30%, small left atrial branch 95%, oLAD occluded, S-dRCA patent, L-LAD/Dx patent, EF 60%=> Med Rx  ;  d.  Lex MV 3/13:  EF 63%, no ischemia  e.  Lex Myoview 03/08/13:  EF 56%, inf and apical defect suggestive of thinning and soft tissue attenuation, no  ischemia; low risk.    . Bone metastases ([place])   . S/P radiation therapy 07/17/2005 - 09/10/2005    Prostate 7800 cGy, 40 Sessions  . Cataract     bil eyes  . Renal disorder     Past Surgical History  Procedure Laterality Date  . Coronary artery bypass graft  1998    x5, with a left internal mammary to the LAD and diagonal, vein graft to the OM1 and 2, and a vein graft to the right coronary artery  . Coronary stent placement  11/13/2007    LCX  . Trigger finger repaired  2009  . Laparoscopic gastrectomy N/A 08/18/2012    Procedure: Laparoscopic Partial Gastrectomy;  Surgeon: [name] Hector, MD;  Location: [facility]S;  Service: General;  Laterality: N/A;  Laparoscopic Partial Gastrectomy,  . Colonoscopy    . Stomach surgery    . Upper gastrointestinal endoscopy      Social History:  reports that he has quit smoking. He quit smokeless tobacco use about 67 years ago. He reports that he does not drink alcohol or use illicit drugs.  Family History:  Family History  Problem Relation Age of Onset  . Stroke Father   . Pneumonia Father   . Colon cancer Neg Hx   . Esophageal cancer Neg Hx   . Rectal cancer Neg Hx   . Stomach cancer Neg Hx      Prior to Admission medications   Medication Sig Start Date End Date Taking? Authorizing Provider  acetaminophen (TYLENOL) 500 MG tablet Take 1,000 mg by mouth every 6 (six) hours as needed for pain.   Yes Historical Provider, MD  amLODipine-valsartan (EXFORGE) 10-320 MG per tablet TAKE 1/2 TABLET BY MOUTH EVERY DAY 10/05/14  Yes [name] Lor, MD  aspirin EC 81 MG tablet Take 81 mg by mouth every morning.    Yes Historical Provider, MD  atorvastatin (LIPITOR) 10 MG tablet TAKE 1 TABLET BY MOUTH EVERY DAY 03/16/15  Yes [name] Lor, MD  calcium carbonate-magnesium hydroxide (ROLAIDS) 334 MG CHEW Chew 1 tablet by mouth 3 (three) times daily as needed (indigestion/heartburn).    Yes Historical Provider, MD  Calcium Citrate-Vitamin D (CITRACAL +  D PO) Take 1 tablet by mouth daily.   Yes Historical Provider, MD  Denosumab (XGEVA [place]) Inject into the skin every 30 (thirty) days. Injects "around the first part of each month."   Yes Historical Provider, MD  donepezil (ARICEPT) 10 MG tablet TAKE 1 TABLET BY MOUTH DAILY 02/10/15  Yes [name] Lor, MD  glimepiride (AMARYL) 4 MG tablet Take 0.5 tablets (2 mg total) by mouth daily with breakfast. 07/11/15  Yes [name] Lor, MD  glucose blood (ACCU-CHEK AVIVA) test strip USE TO CHECK BLOOD SUGAR DAILY AND PRN 05/04/15  Yes [name] Lor, MD  Insulin Pen Needle (CAREFINE PEN NEEDLES) 32G X 4 MM MISC USE TO INJECT INSULIN DAILY 01/10/15  Yes [name] Lor, MD  leuprolide (LUPRON) 3.75 MG  injection Inject 3.75 mg into the muscle every 4 (four) months.   Yes Historical Provider, MD  levothyroxine (SYNTHROID, LEVOTHROID) 50 MCG tablet TAKE 1 TABLET BY MOUTH DAILY 01/27/15  Yes [name] Lor, MD  Liraglutide 18 MG/3ML SOPN Inject 0.3 mLs (1.8 mg total) into the skin every morning. 02/02/15  Yes [name] Lor, MD  LYRICA 50 MG capsule TAKE 2 CAPSULES BY MOUTH EVERY MORNING THEN TAKE 1 CAPSULE BY MOUTH EVERY EVENING 12/23/14  Yes Star Age, MD  NAMENDA XR 28 MG CP24 24 hr capsule TAKE 1 CAPSULE BY MOUTH DAILY 04/28/15  Yes [name] Lor, MD  Nutritional Supplements (JUICE PLUS FIBRE PO) Take 6 capsules by mouth daily.    Yes Historical Provider, MD  pioglitazone (ACTOS) 15 MG tablet TAKE 1 TABLET BY MOUTH DAILY 05/30/15  Yes [name] Lor, MD  traMADol (ULTRAM) 50 MG tablet TAKE 1 TABLET BY MOUTH EVERY 8 HOURS AS NEEDED Patient taking differently: TAKE 1 TABLET BY MOUTH EVERY 8 HOURS AS NEEDED for pain   Yes [name] Lor, MD  VESICARE 10 MG tablet Take 10 mg by mouth daily.  09/05/14  Yes Historical Provider, MD  metFORMIN (GLUCOPHAGE) 1000 MG tablet Take 1 tablet (1,000 mg total) by mouth daily with breakfast. Patient not taking: Reported on 07/13/2015 10/05/14    [name] Lor, MD  nitroGLYCERIN (NITROSTAT) 0.4 MG SL tablet Place 0.4 mg under the tongue every 5 (five) minutes as needed. Reported on 07/14/2015    Historical Provider, MD    Physical Exam: Filed Vitals:   07/14/15 1842 07/14/15 2226  BP: 131/64 128/69  Pulse: 61 57  Temp: 97.4 F (36.3 C)   TempSrc: Oral   Resp: 16 16  SpO2: 100% 100%   General: Not in acute distress HEENT:       Eyes: PERRL, EOMI, no scleral icterus.       ENT: No discharge from the ears and nose, no pharynx injection, no tonsillar enlargement.        Neck: No JVD, no bruit, no mass felt. Heme: No neck lymph node enlargement. Cardiac: S1/S2, RRR, No murmurs, No gallops or rubs. Pulm: No rales, wheezing, rhonchi or rubs. Abd: Soft, nondistended, nontender, no rebound pain, no organomegaly, BS present. Ext: No pitting leg edema bilaterally. 2+DP/PT pulse bilaterally. Musculoskeletal: No joint deformities, No joint redness or warmth, no limitation of ROM in spin. Skin: No rashes.  Neuro: Alert, oriented X3, cranial nerves II-XII grossly intact, moves all extremities. Psych: Patient is not psychotic, no suicidal or hemocidal ideation.  Labs on Admission:  Basic Metabolic Panel:  Recent Labs Lab 07/13/15 1534 07/14/15 2058  NA 139 138  K 4.0 3.8  CL  --  110  CO2 19* 19*  GLUCOSE 322* 109*  BUN 39.7* 42*  CREATININE 2.9* 2.82*  CALCIUM 8.5 7.9*   Liver Function Tests:  Recent Labs Lab 07/13/15 1534 07/14/15 2058  AST 18 21  ALT 14 18  ALKPHOS 226* 177*  BILITOT 0.54 0.9  PROT 7.2 6.5  ALBUMIN 3.4* 3.4*   No results for input(s): LIPASE, AMYLASE in the last 168 hours. No results for input(s): AMMONIA in the last 168 hours. CBC:  Recent Labs Lab 07/13/15 1533 07/14/15 2058  WBC 3.1* 3.3*  NEUTROABS 2.0  --   HGB 10.2* 9.3*  HCT 30.4* 27.8*  MCV 92.7 92.7  PLT 160 161   Cardiac Enzymes: No results for input(s): CKTOTAL, CKMB, CKMBINDEX, TROPONINI in the last  168  hours.  BNP (last 3 results) No results for input(s): BNP in the last 8760 hours.  ProBNP (last 3 results) No results for input(s): PROBNP in the last 8760 hours.  CBG:  Recent Labs Lab 07/14/15 1848  GLUCAP 137*    Radiological Exams on Admission: No results found.  Assessment/Plan Principal Problem:   Acute renal failure superimposed on stage 3 chronic kidney disease (HCC) Active Problems:   Hypothyroidism   Diabetes mellitus due to underlying condition with diabetic polyneuropathy (HCC)   Dyslipidemia   ANEMIA NEC   Essential hypertension   GERD   Prostate cancer ([place])   ED (erectile dysfunction)   OA (osteoarthritis)   Depression   GIST (gastrointestinal stromal tumor), T2NX 3cm, proximal stomach s/p lap resection Feb 2014   Bone metastases ([place])   Renal failure (ARF), acute on chronic (HCC)   CAD (coronary artery disease)   Dementia  AoCKD-III: Baseline Cre is 1.5-1.9, his Cre is 2.9 and BUN 39.7 on admission. Likely due to prerenal secondary to dehydration and continuation of ARB - IVF: ns 125 cc/h - Check FeNa - US-renal - Follow up renal function by BMP - Hold Valsatan  Hypothyroidism: Last TSH was 0.75 on 03/29/13. -Continue home Synthroid  DM-II: Last A1c 6.4 on 04/12/15, well controled. Patient is taking metformin, Amaryl, Actos, liraglutide at home -SSI  HLD: Last LDL was 65 on 03/29/13 -Continue home medications: Lipitor  Essential hypertension: -Switch Exforge to Amlodipine due to worsening renal function  End stage of Prostate cancer [facility]): per [name] [name], should start supportive care only, hospice enrollment and possible SNF placement.  -consult to SW for possible SNF placement -pt has appointmentalso is on 07/18/15. -Follow-up [name] [name]  CAD: s/p of CABG and stent. No CP -When necessary nitroglycerin, aspirin, Lipitor  Dementia: -Donepezil and Namenda   DVT ppx: SQ Heparin       Code Status: DNR Family Communication: Yes,  patient's daughter  at bed side Disposition Plan: Admit to inpatient   Date of Service 07/14/2015    Ivor Costa [facility] Pager [phone]  If 7PM-7AM, please contact night-coverage [email or web address] Password [email or web address] 07/14/2015, 11:35 PM

## 2015-07-15 ENCOUNTER — Inpatient Hospital Stay (HOSPITAL_COMMUNITY): Payer: Medicare Other

## 2015-07-15 DIAGNOSIS — C7951 Secondary malignant neoplasm of bone: Secondary | ICD-10-CM

## 2015-07-15 DIAGNOSIS — N183 Chronic kidney disease, stage 3 (moderate): Secondary | ICD-10-CM

## 2015-07-15 DIAGNOSIS — F0390 Unspecified dementia without behavioral disturbance: Secondary | ICD-10-CM

## 2015-07-15 DIAGNOSIS — C61 Malignant neoplasm of prostate: Secondary | ICD-10-CM

## 2015-07-15 DIAGNOSIS — N179 Acute kidney failure, unspecified: Principal | ICD-10-CM

## 2015-07-15 LAB — BASIC METABOLIC PANEL
ANION GAP: 8 (ref 5–15)
BUN: 39 mg/dL — ABNORMAL HIGH (ref 6–20)
CO2: 19 mmol/L — ABNORMAL LOW (ref 22–32)
Calcium: 8 mg/dL — ABNORMAL LOW (ref 8.9–10.3)
Chloride: 115 mmol/L — ABNORMAL HIGH (ref 101–111)
Creatinine, Ser: 2.63 mg/dL — ABNORMAL HIGH (ref 0.61–1.24)
GFR, EST AFRICAN AMERICAN: 24 mL/min — AB (ref >60)
GFR, EST NON AFRICAN AMERICAN: 21 mL/min — AB (ref >60)
Glucose, Bld: 170 mg/dL — ABNORMAL HIGH (ref 65–99)
POTASSIUM: 3.6 mmol/L (ref 3.5–5.1)
SODIUM: 142 mmol/L (ref 135–145)

## 2015-07-15 LAB — URINALYSIS, ROUTINE W REFLEX MICROSCOPIC
Bilirubin Urine: NEGATIVE
GLUCOSE, UA: 100 mg/dL — AB
HGB URINE DIPSTICK: NEGATIVE
KETONES UR: NEGATIVE mg/dL
Leukocytes, UA: NEGATIVE
Nitrite: NEGATIVE
PROTEIN: 30 mg/dL — AB
Specific Gravity, Urine: 1.01 (ref 1.005–1.030)
pH: 6.5 (ref 5.0–8.0)

## 2015-07-15 LAB — GLUCOSE, CAPILLARY
GLUCOSE-CAPILLARY: 129 mg/dL — AB (ref 65–99)
GLUCOSE-CAPILLARY: 141 mg/dL — AB (ref 65–99)
Glucose-Capillary: 148 mg/dL — ABNORMAL HIGH (ref 65–99)
Glucose-Capillary: 188 mg/dL — ABNORMAL HIGH (ref 65–99)

## 2015-07-15 LAB — CBC
HEMATOCRIT: 24.2 % — AB (ref 39.0–52.0)
HEMOGLOBIN: 7.9 g/dL — AB (ref 13.0–17.0)
MCH: 31.1 pg (ref 26.0–34.0)
MCHC: 32.6 g/dL (ref 30.0–36.0)
MCV: 95.3 fL (ref 78.0–100.0)
Platelets: 143 10*3/uL — ABNORMAL LOW (ref 150–400)
RBC: 2.54 MIL/uL — AB (ref 4.22–5.81)
RDW: 14.6 % (ref 11.5–15.5)
WBC: 2.4 10*3/uL — AB (ref 4.0–10.5)

## 2015-07-15 LAB — URINE MICROSCOPIC-ADD ON
Bacteria, UA: NONE SEEN
RBC / HPF: NONE SEEN RBC/hpf (ref 0–5)

## 2015-07-15 LAB — PROTIME-INR
INR: 1.17 (ref 0.00–1.49)
Prothrombin Time: 15.1 seconds (ref 11.6–15.2)

## 2015-07-15 LAB — SODIUM, URINE, RANDOM: Sodium, Ur: 35 mmol/L

## 2015-07-15 LAB — CREATININE, URINE, RANDOM: Creatinine, Urine: 63.15 mg/dL

## 2015-07-15 NOTE — Progress Notes (Signed)
PHARMACIST - PHYSICIAN ORDER COMMUNICATION  CONCERNING: P&T Medication Policy on Herbal Medications  DESCRIPTION:  This patient's order for:  Juice plus fibre  has been noted.  This product(s) is classified as an "herbal" or natural product/nutritional supplement.  Due to a lack of definitive safety studies or FDA approval, nonstandard manufacturing practices, plus the potential risk of unknown drug-drug interactions while on inpatient medications, the Pharmacy and Therapeutics Committee does not permit the use of "herbal" or natural products of this type within [facility].   ACTION TAKEN: The pharmacy department is unable to verify this order at this time and your patient has been informed of this safety policy. Please reevaluate patient's clinical condition at discharge and address if the herbal or natural product(s) should be resumed at that time.  Thanks Dorrene German 07/15/2015 12:51 AM

## 2015-07-15 NOTE — Progress Notes (Signed)
[facility] PROGRESS NOTE  [name] [name] [id number] DOB: 08-19-1929 DOA: 07/14/2015 PCP: [name], MD  82 year old male with history of metastatic end-stage prostate cancer, CAD with history of CABG and stent placement, colitis, dementia, GI AST, CK D stage III sent by his PCP with worsened renal function. Patient had a progressive prostate cancer and per his oncologist he is recommended stopping treatment, providing supportive care with hospice involvement. (He has an appointment with the hospice team on 07/18/2015). Patient had blood will done at his PCP office which showed acute on chronic kidney disease. He has been increasingly weak at home and daughter also noticed him to be confused for the past week. Patient admitted to hospitalist service.   Assessment/Plan: Acute on chronic kidney disease stage III Creatinine worsened to 2.9 from a baseline of 1.5 1.9. Possibly in the setting of dehydration. Renal ultrasound show moderate left-sided hydronephrosis. His renal function is slowly improving with IV hydration and stopping his metformin and ARB. - FeNa of 1.2. -Monitor with IV fluids. If no significant improvement I will consult urology regarding his hydronephrosis but about would be a candidate for any intervention.  Hypothyroidism:  -Continue home Synthroid  DM type 2 :  Last A1c 6.4 on 04/12/15. Holding oral hypoglycemics. Daughter concerned that he may need insulin. i assured her this is unlikely.   Essential hypertension: -Switched Exforge to Amlodipine due to worsening renal function  End stage of Prostate cancer [facility]):  per Dr. Alen Blew, should start supportive care only, hospice enrollment and possible SNF placement. PT recommends HH. -consult to SW for possible SNF placement -has outpt hospice appt on  07/18/15.   CAD with hx of  s/p of CABG  Continue  aspirin, Lipitor  Dementia: -continue Donepezil and Namenda   Diet: regular  DVT  prophylaxis  Code Status: DO NOT RESUSCITATE Family Communication: Son and daughter at bedside Disposition Plan: Possibly home with hospice    Consultants:  None  Procedures:  Renal ultrasound  Antibiotics:  None  HPI/Subjective: Seen and examined. Denies any symptoms.  Objective: Filed Vitals:   07/15/15 0539 07/15/15 1347  BP: 131/56 147/57  Pulse: 50 51  Temp: 97.6 F (36.4 C) 97.3 F (36.3 C)  Resp: 18 20    Intake/Output Summary (Last 24 hours) at 07/15/15 1629 Last data filed at 07/15/15 1500  Gross per 24 hour  Intake 2111.67 ml  Output   1175 ml  Net 936.67 ml   Filed Weights   07/15/15 0037 07/15/15 0050  Weight: 84.868 kg (187 lb 1.6 oz) 84.868 kg (187 lb 1.6 oz)    Exam:   General: Elderly male not in distress  HEENT: Moist mucosa  Chest: Clear bilaterally  CVS: Normal S1 and S2, systolic murmur 3/6  GI: Soft, nontender, nondistended  Musculoskeletal: Warm, no edema  CNS: Alert and oriented 2    Data Reviewed: Basic Metabolic Panel:  Recent Labs Lab 07/13/15 1534 07/14/15 2058 07/15/15 0515  NA 139 138 142  K 4.0 3.8 3.6  CL  --  110 115*  CO2 19* 19* 19*  GLUCOSE 322* 109* 170*  BUN 39.7* 42* 39*  CREATININE 2.9* 2.82* 2.63*  CALCIUM 8.5 7.9* 8.0*   Liver Function Tests:  Recent Labs Lab 07/13/15 1534 07/14/15 2058  AST 18 21  ALT 14 18  ALKPHOS 226* 177*  BILITOT 0.54 0.9  PROT 7.2 6.5  ALBUMIN 3.4* 3.4*   No results for input(s): LIPASE, AMYLASE in the last 168 hours.  No results for input(s): AMMONIA in the last 168 hours. CBC:  Recent Labs Lab 07/13/15 1533 07/14/15 2058 07/15/15 0515  WBC 3.1* 3.3* 2.4*  NEUTROABS 2.0  --   --   HGB 10.2* 9.3* 7.9*  HCT 30.4* 27.8* 24.2*  MCV 92.7 92.7 95.3  PLT 160 161 143*   Cardiac Enzymes: No results for input(s): CKTOTAL, CKMB, CKMBINDEX, TROPONINI in the last 168 hours. BNP (last 3 results) No results for input(s): BNP in the last 8760  hours.  ProBNP (last 3 results) No results for input(s): PROBNP in the last 8760 hours.  CBG:  Recent Labs Lab 07/14/15 1848 07/15/15 0728 07/15/15 1139  GLUCAP 137* 129* 148*    No results found for this or any previous visit (from the past 240 hour(s)).   Studies: US Renal  07/15/2015  CLINICAL DATA:  Acute kidney injury. Known prostate carcinoma with metastatic disease. EXAM: RENAL / URINARY TRACT ULTRASOUND COMPLETE COMPARISON:  None. FINDINGS: Right Kidney: Length: 9.7 cm. Borderline increased renal parenchymal echogenicity. Diffuse cortical thinning. No mass or stone. No hydronephrosis. Left Kidney: Length: 12.1 cm. Normal parenchymal echogenicity. Mild renal cortical thinning. No masses. Moderate hydronephrosis. Proximal ureter is also dilated. Bladder: No bladder mass or wall thickening. Left ureteral jet not visualized. IMPRESSION: 1. Moderate left hydroureteronephrosis of unclear etiology. 2. Mild bilateral renal cortical thinning. Borderline increased renal parenchymal echogenicity on the right suggesting medical renal disease. No right hydronephrosis. No renal masses. Electronically Signed   By: Lajean Manes M.D.   On: 07/15/2015 11:07    Scheduled Meds: . amLODipine  10 mg Oral Daily  . aspirin EC  81 mg Oral q morning - 10a  . atorvastatin  10 mg Oral Daily  . darifenacin  7.5 mg Oral Daily  . donepezil  10 mg Oral Daily  . heparin  5,000 Units Subcutaneous 3 times per day  . insulin aspart  0-9 Units Subcutaneous TID WC  . levothyroxine  50 mcg Oral Daily  . memantine  28 mg Oral Daily  . sodium chloride  3 mL Intravenous Q12H   Continuous Infusions: . sodium chloride 125 mL/hr at 07/15/15 [id number]      Time spent: 25 minutes    Louellen Molder  [facility] Pager [phone] If 7PM-7AM, please contact night-coverage at [email or web address], password [facility] 07/15/2015, 4:29 PM  LOS: 1 day

## 2015-07-15 NOTE — Evaluation (Signed)
Occupational Therapy Evaluation Patient Details Name: [name] [name] MRN: [id number] DOB: 1930/02/06 Today's Date: 07/15/2015    History of Present Illness 82 y.o. male with h/o end stage metastatic prostate cancer, CABG, dementia admitted with acute on chronic renal failure.    Clinical Impression   Patient presenting with decreased ADL and functional mobility independence secondary to above. Patient independent to mod I PTA. Patient currently functioning at an overall supervision level. Patient will benefit from acute OT to increase overall independence in the areas of ADLs, functional mobility, and overall safety in order to safely discharge home with HHOT.   Patient's daughter in room and states patient normally forgets to take medications and at times forgets to eat. Daughter nervous about patient going home to live alone with aide coming in for 3 hours per day. Daughter reports they plan on increasing supervision and assistance patient is receiving at home. Pt may benefit from discharging to an ALF with HH therapies.     Follow Up Recommendations  Home health OT;Supervision/Assistance - 24 hour    Equipment Recommendations  3 in 1 bedside comode    Recommendations for Other Services  none at this time    Precautions / Restrictions Precautions Precautions: Fall Precaution Comments: pt denies falls in past year, however daughter states she thinks he does not tell when/if he falls Restrictions Weight Bearing Restrictions: No    Mobility Bed Mobility Overal bed mobility: Modified Independent General bed mobility comments: no bed rails, HOB down  Transfers Overall transfer level: Needs assistance   Transfers: Sit to/from Stand Sit to Stand: Supervision General transfer comment: supervision for safety    Balance Overall balance assessment: Modified Independent    ADL Overall ADL's : Needs assistance/impaired General ADL Comments: Pt overall supervision for ADLs, acute  care OT goals set for mod I for ADLs and functional mobility/transfers    Pertinent Vitals/Pain Pain Assessment: No/denies pain     Hand Dominance Right   Extremity/Trunk Assessment Upper Extremity Assessment Upper Extremity Assessment: Overall WFL for tasks assessed   Lower Extremity Assessment Lower Extremity Assessment: Overall WFL for tasks assessed   Cervical / Trunk Assessment Cervical / Trunk Assessment: Normal   Communication Communication Communication: HOH   Cognition Arousal/Alertness: Awake/alert Behavior During Therapy: WFL for tasks assessed/performed Overall Cognitive Status: Within Functional Limits for tasks assessed              Home Living Family/patient expects to be discharged to:: Private residence Living Arrangements: Alone Available Help at Discharge: Personal care attendant;Available PRN/intermittently (PCA 3 hours per day 5 days a week) Type of Home: House Home Access: Stairs to enter [employer] of Steps: 2 Entrance Stairs-Rails: None Home Layout: One level     Bathroom Shower/Tub: Walk-in shower;Door   Bathroom Toilet: Handicapped height     Home Equipment: Grab bars - tub/shower;Walker - 2 wheels;Cane - single point;Shower seat   Additional Comments: pt is retired Chief Executive Officer and judge. Likes to read and sing in [place]. Still drives.       Prior Functioning/Environment Level of Independence: Independent with assistive device(s)  Comments: uses cane outside, holds onto furniture/walls inside; Independent bathing/dressing, aide assists with housework and meal prep  3*/day, family wants to increase time aide comes     OT Diagnosis: Generalized weakness   OT Problem List: Decreased strength;Decreased activity tolerance;Impaired balance (sitting and/or standing);Decreased safety awareness;Decreased knowledge of use of DME or AE   OT Treatment/Interventions: Self-care/ADL training;Energy conservation;DME and/or AE  instruction;Therapeutic  activities;Patient/family education;Balance training    OT Goals(Current goals can be found in the care plan section) Acute Rehab OT Goals Patient Stated Goal: go home, not to rehab OT Goal Formulation: With patient/family Time For Goal Achievement: 07/22/15 Potential to Achieve Goals: Good ADL Goals Pt Will Perform Grooming: with modified independence;standing Pt Will Transfer to Toilet: with modified independence;ambulating Pt Will Perform Tub/Shower Transfer: Shower transfer;ambulating;shower seat;with modified independence (using LRAD) Additional ADL Goal #1: Pt will be mod I with functional mobility/ambulation  OT Frequency: Min 2X/week   Barriers to D/C: Decreased caregiver support   End of Session Activity Tolerance: Patient tolerated treatment well Patient left: in chair;with call bell/phone within reach;with family/visitor present   Time: 1441-1500 OT Time Calculation (min): 19 min Charges:  OT General Charges $OT Visit: 1 Procedure OT Evaluation $OT Eval Low Complexity: 1 Procedure  Chrys Racer , MS, OTR/L, CLT Pager: [phone]  07/15/2015, 3:11 PM

## 2015-07-15 NOTE — Progress Notes (Signed)
OT Cancellation Note  Patient Details Name: [name] [name] MRN: [id number] DOB: [birth year]   Cancelled Treatment:    Reason Eval/Treat Not Completed: Patient at procedure or test/ unavailable (ultrasound). Will check back as schedule allows for OT eval/treat. Thank you for the order.  Chrys Racer , MS, OTR/L, CLT Pager: [phone]  07/15/2015, 10:03 AM

## 2015-07-15 NOTE — Evaluation (Signed)
Physical Therapy Evaluation Patient Details Name: [name] [name] MRN: [id number] DOB: 31-Oct-1929 Today's Date: 07/15/2015   History of Present Illness  82 y.o. male with h/o end stage metastatic prostate cancer, CABG, dementia admitted with acute on chronic renal failure.   Clinical Impression  Pt admitted with above diagnosis. Pt currently with functional limitations due to the deficits listed below (see PT Problem List). Pt ambulated 260' with RW without loss of balance. At baseline he walks with a cane, but at present needs increased support of RW.  Pt will benefit from skilled PT to increase their independence and safety with mobility to allow discharge to the venue listed below.   *    Follow Up Recommendations Home health PT    Equipment Recommendations  None recommended by PT    Recommendations for Other Services       Precautions / Restrictions Precautions Precautions: Fall Precaution Comments: pt denies falls in past year      Mobility  Bed Mobility Overal bed mobility: Modified Independent             General bed mobility comments: HOB up 20*, used rail  Transfers Overall transfer level: Modified independent Equipment used: Rolling walker (2 wheeled)                Ambulation/Gait Ambulation/Gait assistance: Modified independent (Device/Increase time) Ambulation Distance (Feet): 260 Feet Assistive device: Rolling walker (2 wheeled) Gait Pattern/deviations: Step-through pattern;Decreased step length - left;Decreased step length - right   Gait velocity interpretation: at or above normal speed for age/gender General Gait Details: steady with RW, no LOB, HR 87 walking, 1/4 dyspnea  Stairs            Wheelchair Mobility    Modified Rankin (Stroke Patients Only)       Balance Overall balance assessment: Modified Independent                                           Pertinent Vitals/Pain Pain Assessment: No/denies pain     Home Living Family/patient expects to be discharged to:: Private residence Living Arrangements: Alone Available Help at Discharge: Personal care attendant;Available PRN/intermittently (aide 3*/day) Type of Home: House Home Access: Stairs to enter Entrance Stairs-Rails: None Entrance Stairs-Number of Steps: 2 Home Layout: One level Home Equipment: Grab bars - tub/shower;Walker - 2 wheels;Kasandra Knudsen - single point Additional Comments: pt is retired Chief Executive Officer and judge. Likes to read and sing in [place]. Still drives.     Prior Function Level of Independence: Independent with assistive device(s)         Comments: uses cane outside, holds onto furniture/walls inside; Independent bathing/dressing, aide assists with housework and meal prep  3*/day, family wants to increase time aide comes      Hand Dominance        Extremity/Trunk Assessment   Upper Extremity Assessment: Overall WFL for tasks assessed           Lower Extremity Assessment: Overall WFL for tasks assessed      Cervical / Trunk Assessment: Normal  Communication   Communication: HOH  Cognition Arousal/Alertness: Awake/alert Behavior During Therapy: WFL for tasks assessed/performed Overall Cognitive Status: Within Functional Limits for tasks assessed                      General Comments      Exercises  Assessment/Plan    PT Assessment    PT Diagnosis Generalized weakness   PT Problem List    PT Treatment Interventions     PT Goals (Current goals can be found in the Care Plan section) Acute Rehab PT Goals Patient Stated Goal: sing in barbershop quartet PT Goal Formulation: With patient/family Time For Goal Achievement: 07/29/15 Potential to Achieve Goals: Good    Frequency     Barriers to discharge        Co-evaluation               End of Session Equipment Utilized During Treatment: Gait belt Activity Tolerance: Patient tolerated treatment well;No increased  pain Patient left: in chair;with call bell/phone within reach;with family/visitor present Nurse Communication: Mobility status         Time: [id number] PT Time Calculation (min) (ACUTE ONLY): 33 min   Charges:   PT Evaluation $PT Eval Low Complexity: 1 Procedure PT Treatments $Gait Training: 8-22 mins   PT G Codes:        Philomena Doheny 07/15/2015, 9:32 AM [phone]

## 2015-07-16 LAB — GLUCOSE, CAPILLARY
GLUCOSE-CAPILLARY: 173 mg/dL — AB (ref 65–99)
GLUCOSE-CAPILLARY: 153 mg/dL — AB (ref 65–99)
GLUCOSE-CAPILLARY: 167 mg/dL — AB (ref 65–99)
Glucose-Capillary: 180 mg/dL — ABNORMAL HIGH (ref 65–99)

## 2015-07-16 LAB — CBC
HEMATOCRIT: 25.1 % — AB (ref 39.0–52.0)
Hemoglobin: 8.2 g/dL — ABNORMAL LOW (ref 13.0–17.0)
MCH: 31.3 pg (ref 26.0–34.0)
MCHC: 32.7 g/dL (ref 30.0–36.0)
MCV: 95.8 fL (ref 78.0–100.0)
Platelets: 139 10*3/uL — ABNORMAL LOW (ref 150–400)
RBC: 2.62 MIL/uL — ABNORMAL LOW (ref 4.22–5.81)
RDW: 14.7 % (ref 11.5–15.5)
WBC: 2.3 10*3/uL — ABNORMAL LOW (ref 4.0–10.5)

## 2015-07-16 LAB — BASIC METABOLIC PANEL
Anion gap: 9 (ref 5–15)
BUN: 31 mg/dL — AB (ref 6–20)
CO2: 16 mmol/L — AB (ref 22–32)
Calcium: 7.8 mg/dL — ABNORMAL LOW (ref 8.9–10.3)
Chloride: 117 mmol/L — ABNORMAL HIGH (ref 101–111)
Creatinine, Ser: 2.21 mg/dL — ABNORMAL HIGH (ref 0.61–1.24)
GFR calc Af Amer: 30 mL/min — ABNORMAL LOW (ref >60)
GFR, EST NON AFRICAN AMERICAN: 26 mL/min — AB (ref >60)
GLUCOSE: 191 mg/dL — AB (ref 65–99)
Potassium: 4 mmol/L (ref 3.5–5.1)
Sodium: 142 mmol/L (ref 135–145)

## 2015-07-16 MED ORDER — SODIUM BICARBONATE 650 MG PO TABS
650.0000 mg | ORAL_TABLET | Freq: Two times a day (BID) | ORAL | Status: DC
  Administered 2015-07-16 – 2015-07-18 (×5): 650 mg via ORAL
  Filled 2015-07-16 (×5): qty 1

## 2015-07-16 NOTE — Progress Notes (Signed)
Utilization review completed.  

## 2015-07-16 NOTE — Progress Notes (Signed)
[facility] PROGRESS NOTE  [name] [name] [id number] DOB: 07-05-1929 DOA: 07/14/2015 PCP: [name], MD  82 year old male with history of metastatic end-stage prostate cancer, CAD with history of CABG and stent placement, colitis, dementia, GI AST, CK D stage III sent by his PCP with worsened renal function. Patient had a progressive prostate cancer and per his oncologist he is recommended stopping treatment, providing supportive care with hospice involvement. (He has an appointment with the hospice team on 07/18/2015). Patient had blood will done at his PCP office which showed acute on chronic kidney disease. He has been increasingly weak at home and daughter also noticed him to be confused for the past week. Patient admitted to hospitalist service.   Assessment/Plan: Acute on chronic kidney disease stage III Creatinine worsened to 2.9 from a baseline of 1.5 1.9. Possibly in the setting of dehydration. Renal ultrasound show moderate left-sided hydronephrosis. His renal function is slowly improving with IV hydration and stopping his metformin and ARB. - FeNa of 1.2. -Improving slowly with IV fluids. Will add sodium bicarbonate tablet.  Hypothyroidism:  -Continue home Synthroid  DM type 2 :  Last A1c 6.4 on 04/12/15. Holding oral hypoglycemics. Daughter concerned that he may need insulin. i assured her this is unlikely.   Essential hypertension: -Switched Exforge to Amlodipine due to worsening renal function  End stage of Prostate cancer [facility]):  per Dr. Alen Blew, should start supportive care only, hospice enrollment and possible SNF placement. PT recommends HH. -consult to SW for possible SNF placement since patient lives alone and given his dementia will need more help at home. -has outpt hospice appt on  07/18/15.   CAD with hx of  s/p of CABG  Continue  aspirin, Lipitor  Dementia: -continue Donepezil and Namenda   Diet: regular  DVT prophylaxis  Code  Status: DO NOT RESUSCITATE Family Communication: None at bedside Disposition Plan: Home with hospice versus skilled nursing facility on 1/16.   Consultants:  None  Procedures:  Renal ultrasound  Antibiotics:  None  HPI/Subjective: Seen and examined. Denies any symptoms.  Objective: Filed Vitals:   07/15/15 2052 07/16/15 0521  BP: 116/72 157/65  Pulse: 64 52  Temp: 98.3 F (36.8 C) 98.2 F (36.8 C)  Resp: 18 18    Intake/Output Summary (Last 24 hours) at 07/16/15 1247 Last data filed at 07/16/15 0907  Gross per 24 hour  Intake   4920 ml  Output   2001 ml  Net   2919 ml   Filed Weights   07/15/15 0037 07/15/15 0050  Weight: 84.868 kg (187 lb 1.6 oz) 84.868 kg (187 lb 1.6 oz)    Exam:   General:  not in distress  HEENT: Moist mucosa  Chest: Clear bilaterally  CVS: Normal S1 and S2, systolic murmur 3/6  GI: Soft, nontender, nondistended  Musculoskeletal: Warm, no edema  CNS: Alert and oriented 2    Data Reviewed: Basic Metabolic Panel:  Recent Labs Lab 07/13/15 1534 07/14/15 2058 07/15/15 0515 07/16/15 0605  NA 139 138 142 142  K 4.0 3.8 3.6 4.0  CL  --  110 115* 117*  CO2 19* 19* 19* 16*  GLUCOSE 322* 109* 170* 191*  BUN 39.7* 42* 39* 31*  CREATININE 2.9* 2.82* 2.63* 2.21*  CALCIUM 8.5 7.9* 8.0* 7.8*   Liver Function Tests:  Recent Labs Lab 07/13/15 1534 07/14/15 2058  AST 18 21  ALT 14 18  ALKPHOS 226* 177*  BILITOT 0.54 0.9  PROT 7.2 6.5  ALBUMIN  3.4* 3.4*   No results for input(s): LIPASE, AMYLASE in the last 168 hours. No results for input(s): AMMONIA in the last 168 hours. CBC:  Recent Labs Lab 07/13/15 1533 07/14/15 2058 07/15/15 0515 07/16/15 0605  WBC 3.1* 3.3* 2.4* 2.3*  NEUTROABS 2.0  --   --   --   HGB 10.2* 9.3* 7.9* 8.2*  HCT 30.4* 27.8* 24.2* 25.1*  MCV 92.7 92.7 95.3 95.8  PLT 160 161 143* 139*   Cardiac Enzymes: No results for input(s): CKTOTAL, CKMB, CKMBINDEX, TROPONINI in the last 168  hours. BNP (last 3 results) No results for input(s): BNP in the last 8760 hours.  ProBNP (last 3 results) No results for input(s): PROBNP in the last 8760 hours.  CBG:  Recent Labs Lab 07/15/15 1139 07/15/15 1706 07/15/15 2114 07/16/15 0746 07/16/15 1129  GLUCAP 148* 188* 141* 180* 173*    No results found for this or any previous visit (from the past 240 hour(s)).   Studies: US Renal  07/15/2015  CLINICAL DATA:  Acute kidney injury. Known prostate carcinoma with metastatic disease. EXAM: RENAL / URINARY TRACT ULTRASOUND COMPLETE COMPARISON:  None. FINDINGS: Right Kidney: Length: 9.7 cm. Borderline increased renal parenchymal echogenicity. Diffuse cortical thinning. No mass or stone. No hydronephrosis. Left Kidney: Length: 12.1 cm. Normal parenchymal echogenicity. Mild renal cortical thinning. No masses. Moderate hydronephrosis. Proximal ureter is also dilated. Bladder: No bladder mass or wall thickening. Left ureteral jet not visualized. IMPRESSION: 1. Moderate left hydroureteronephrosis of unclear etiology. 2. Mild bilateral renal cortical thinning. Borderline increased renal parenchymal echogenicity on the right suggesting medical renal disease. No right hydronephrosis. No renal masses. Electronically Signed   By: Lajean Manes M.D.   On: 07/15/2015 11:07    Scheduled Meds: . amLODipine  10 mg Oral Daily  . aspirin EC  81 mg Oral q morning - 10a  . atorvastatin  10 mg Oral Daily  . darifenacin  7.5 mg Oral Daily  . donepezil  10 mg Oral Daily  . heparin  5,000 Units Subcutaneous 3 times per day  . insulin aspart  0-9 Units Subcutaneous TID WC  . levothyroxine  50 mcg Oral Daily  . memantine  28 mg Oral Daily  . sodium chloride  3 mL Intravenous Q12H   Continuous Infusions: . sodium chloride 125 mL/hr at 07/16/15 1024      Time spent: 25 minutes    Louellen Molder  [facility] Pager [phone] If 7PM-7AM, please contact night-coverage at [email or web address],  password [facility] 07/16/2015, 12:47 PM  LOS: 2 days

## 2015-07-17 ENCOUNTER — Telehealth: Payer: Self-pay | Admitting: Internal Medicine

## 2015-07-17 LAB — BASIC METABOLIC PANEL
Anion gap: 10 (ref 5–15)
BUN: 28 mg/dL — AB (ref 6–20)
CALCIUM: 8 mg/dL — AB (ref 8.9–10.3)
CO2: 17 mmol/L — ABNORMAL LOW (ref 22–32)
CREATININE: 2.17 mg/dL — AB (ref 0.61–1.24)
Chloride: 118 mmol/L — ABNORMAL HIGH (ref 101–111)
GFR calc Af Amer: 31 mL/min — ABNORMAL LOW (ref >60)
GFR, EST NON AFRICAN AMERICAN: 26 mL/min — AB (ref >60)
Glucose, Bld: 183 mg/dL — ABNORMAL HIGH (ref 65–99)
Potassium: 4.2 mmol/L (ref 3.5–5.1)
SODIUM: 145 mmol/L (ref 135–145)

## 2015-07-17 LAB — HEMOGLOBIN A1C
HEMOGLOBIN A1C: 6.5 % — AB (ref 4.8–5.6)
MEAN PLASMA GLUCOSE: 140 mg/dL

## 2015-07-17 LAB — GLUCOSE, CAPILLARY
GLUCOSE-CAPILLARY: 183 mg/dL — AB (ref 65–99)
Glucose-Capillary: 197 mg/dL — ABNORMAL HIGH (ref 65–99)
Glucose-Capillary: 141 mg/dL — ABNORMAL HIGH (ref 65–99)
Glucose-Capillary: 147 mg/dL — ABNORMAL HIGH (ref 65–99)

## 2015-07-17 MED ORDER — TRAMADOL HCL 50 MG PO TABS: 50.0000 mg | ORAL_TABLET | Freq: Three times a day (TID) | ORAL | Status: DC | PRN

## 2015-07-17 MED ORDER — AMLODIPINE BESYLATE 10 MG PO TABS: 10.0000 mg | ORAL_TABLET | Freq: Every day | ORAL | Status: DC

## 2015-07-17 MED ORDER — SODIUM BICARBONATE 650 MG PO TABS: 650.0000 mg | ORAL_TABLET | Freq: Two times a day (BID) | ORAL | Status: DC

## 2015-07-17 NOTE — Clinical Social Work Placement (Signed)
Patient/daughter, [name] have accepted bed at [facility] - pending [facility] authorization.     Raynaldo Opitz, [facility] Clinical Social Worker cell #: [phone]    CLINICAL SOCIAL WORK PLACEMENT  NOTE  Date:  07/17/2015  Patient Details  Name: [name] [name] MRN: [id number] Date of Birth: Oct 25, 1929  Clinical Social Work is seeking post-discharge placement for this patient at the [place] level of care (*CSW will initial, date and re-position this form in  chart as items are completed):  Yes   Patient/family provided with [place] Work Department's list of facilities offering this level of care within the geographic area requested by the patient (or if unable, by the patient's family).  Yes   Patient/family informed of their freedom to choose among providers that offer the needed level of care, that participate in Medicare, Medicaid or managed care program needed by the patient, have an available bed and are willing to accept the patient.  Yes   Patient/family informed of [place]'s ownership interest in [facility] and [facility], as well as of the fact that they are under no obligation to receive care at these facilities.  PASRR submitted to EDS on 07/17/15     PASRR number received on 07/17/15     Existing PASRR number confirmed on       FL2 transmitted to all facilities in geographic area requested by pt/family on 07/17/15     FL2 transmitted to all facilities within larger geographic area on       Patient informed that his/her managed care company has contracts with or will negotiate with certain facilities, including the following:        Yes   Patient/family informed of bed offers received.  [facility]     Physician recommends and patient chooses bed at      Patient to be transferred to [facility] on  .  Patient to be  transferred to facility by       Patient family notified on   of transfer.  Name of family member notified:        PHYSICIAN       Additional Comment:    _______________________________________________ Standley Brooking, LCSW 07/17/2015, 2:23 PM

## 2015-07-17 NOTE — NC FL2 (Signed)
[place] LEVEL OF CARE SCREENING TOOL     IDENTIFICATION  Patient Name: [name] [name] Birthdate: Dec 09, 1929 Sex: male Admission Date (Current Location): 07/14/2015  [facility] and [place] Number:  Herbalist and Address:  [facility]  [place] [address] [place]      Provider Number: [id number]  Attending Physician Name and Address:  Louellen Molder, MD  Relative Name and Phone Number:       Current Level of Care: Hospital Recommended Level of Care: [place] Prior Approval Number:    Date Approved/Denied:   PASRR Number: [id number]  Discharge Plan: SNF    Current Diagnoses: Patient Active Problem List   Diagnosis Date Noted  . Acute renal failure superimposed on stage 3 chronic kidney disease ([place]) 07/14/2015  . Renal failure (ARF), acute on chronic (HCC) 07/14/2015  . Acute renal failure superimposed on stage 4 chronic kidney disease ([place]) 07/14/2015  . CAD (coronary artery disease) 07/14/2015  . Dementia 07/14/2015  . Bone metastases ([place]) 10/18/2014  . Dry mouth 09/08/2012  . 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 Depression 09/19/2011  . Chest pain 08/31/2011  . Fatigue   . Memory loss   . Rectal ulcer   . IHD (ischemic heart disease)   . Constipation, chronic   . Bruises easily   . History of ventricular fibrillation   . Prostate cancer ([place])   . IBS (irritable bowel syndrome)   . Colitis   . ED (erectile dysfunction)   . OA (osteoarthritis)   . Mild cognitive impairment 03/22/2010  . Hypothyroidism 07/18/2009  . DIABETIC PERIPHERAL NEUROPATHY 01/10/2009  . LEG CRAMPS, NOCTURNAL 01/14/2008  . IRON DEFICIENCY 12/15/2007  . GERD 12/15/2007  . INTERNAL HEMORRHOIDS 12/14/2007  . ANEMIA NEC 02/04/2007  . BACK PAIN 02/04/2007  . SYMPTOM, MALAISE AND FATIGUE NEC 02/04/2007  . Diabetes mellitus due to underlying condition with  diabetic polyneuropathy ([place]) 12/29/2006  . Dyslipidemia 12/29/2006  . Essential hypertension 12/29/2006  . Coronary atherosclerosis 12/29/2006  . DIVERTICULOSIS, COLON 12/29/2006  . PROSTATE CANCER, HX OF 12/29/2006    Orientation RESPIRATION BLADDER Height & Weight    Self, Time  Normal Continent 5\' 8"  (172.7 cm) 187 lbs.  BEHAVIORAL SYMPTOMS/MOOD NEUROLOGICAL BOWEL NUTRITION STATUS      Continent Diet (Heart)  AMBULATORY STATUS COMMUNICATION OF NEEDS Skin   Limited Assist Verbally Normal                       Personal Care Assistance Level of Assistance  Bathing, Feeding, Dressing Bathing Assistance: Limited assistance Feeding assistance: Limited assistance Dressing Assistance: Limited assistance     Functional Limitations [place]  PT (By licensed PT), OT (By licensed OT)     PT Frequency: 5 OT Frequency: 5            Contractures      Additional Factors Info  Code Status, Allergies Code Status Info: DNR Allergies Info: Allergies:  Codeine Phosphate           Current Medications (07/17/2015):  This is the current hospital active medication list Current Facility-Administered Medications  Medication Dose Route Frequency Provider Last Rate Last Dose  . 0.9 %  sodium chloride infusion   Intravenous Continuous Nishant Dhungel, MD 100 mL/hr at 07/16/15 1307    .  acetaminophen (TYLENOL) tablet 650 mg  650 mg Oral Q6H PRN Ivor Costa, MD      . amLODipine (NORVASC) tablet 10 mg  10 mg Oral Daily Ivor Costa, MD   10 mg at 07/17/15 0849  . aspirin EC tablet 81 mg  81 mg Oral q morning - 10a Ivor Costa, MD   81 mg at 07/17/15 0849  . atorvastatin (LIPITOR) tablet 10 mg  10 mg Oral Daily Ivor Costa, MD   10 mg at 07/16/15 1807  . calcium carbonate (TUMS - dosed in mg elemental calcium) chewable tablet 200 mg of elemental calcium  1 tablet Oral TID PRN Ivor Costa, MD      . darifenacin (ENABLEX) 24 hr tablet 7.5 mg  7.5 mg  Oral Daily Ivor Costa, MD   7.5 mg at 07/17/15 0849  . donepezil (ARICEPT) tablet 10 mg  10 mg Oral Daily Ivor Costa, MD   10 mg at 07/17/15 0849  . heparin injection 5,000 Units  5,000 Units Subcutaneous 3 times per day Ivor Costa, MD   5,000 Units at 07/17/15 [id number]  . insulin aspart (novoLOG) injection 0-9 Units  0-9 Units Subcutaneous TID WC Ivor Costa, MD   2 Units at 07/17/15 0849  . levothyroxine (SYNTHROID, LEVOTHROID) tablet 50 mcg  50 mcg Oral Daily Ivor Costa, MD   50 mcg at 07/17/15 0849  . memantine (NAMENDA XR) 24 hr capsule 28 mg  28 mg Oral Daily Ivor Costa, MD   28 mg at 07/17/15 0848  . nitroGLYCERIN (NITROSTAT) SL tablet 0.4 mg  0.4 mg Sublingual Q5 min PRN Ivor Costa, MD      . sodium bicarbonate tablet 650 mg  650 mg Oral BID Nishant Dhungel, MD   650 mg at 07/17/15 0848  . sodium chloride 0.9 % injection 3 mL  3 mL Intravenous Q12H Ivor Costa, MD   3 mL at 07/16/15 2132  . traMADol (ULTRAM) tablet 50 mg  50 mg Oral Q8H PRN Ivor Costa, MD         Discharge Medications: Please see discharge summary for a list of discharge medications.  Relevant Imaging Results:  Relevant Lab Results:   Additional Information SSN: [id number]  Standley Brooking, LCSW

## 2015-07-17 NOTE — Care Management Note (Signed)
Case Management Note  Patient Details  Name: [name] [name] MRN: [id number] Date of Birth: [birth year]  Subjective/Objective:  PT-recc ST SNF.CSW following.                  Action/Plan:d/c SNF.   Expected Discharge Date:                  Expected Discharge Plan:  Skilled Nursing Facility  In-House Referral:  Clinical Social Work  Discharge planning Services  CM Consult  Post Acute Care Choice:    Choice offered to:     DME Arranged:    DME Agency:     HH Arranged:    [place] Agency:     Status of Service:  Completed, signed off  Medicare Important Message Given:  Yes Date Medicare IM Given:    Medicare IM give by:    Date Additional Medicare IM Given:    Additional Medicare Important Message give by:     If discussed at [place] of Stay Meetings, dates discussed:    Additional Comments:  Dessa Phi, RN 07/17/2015, 2:25 PM

## 2015-07-17 NOTE — Care Management Important Message (Signed)
Important Message  Patient Details  Name: TYQUANN MELUCCI MRN: [id number] Date of Birth: 20-Jul-1929   Medicare Important Message Given:  Yes    Camillo Flaming 07/17/2015, 10:53 AMImportant Message  Patient Details  Name: JAMARRE GEISS MRN: [id number] Date of Birth: 1930/01/30   Medicare Important Message Given:  Yes    Camillo Flaming 07/17/2015, 10:53 AM

## 2015-07-17 NOTE — Telephone Encounter (Signed)
Daughter called to ask if Dr Raliegh Ip is affiliated with any rehab facilities    [phone]

## 2015-07-17 NOTE — Progress Notes (Addendum)
Physical Therapy Treatment Patient Details Name: [name] [name] MRN: [id number] DOB: 1929/09/22 Today's Date: 07/17/2015    History of Present Illness 82 y.o. male with h/o end stage metastatic prostate cancer, CABG, dementia admitted with acute on chronic renal failure.     PT Comments    Pt ambulated 260' with RW independently, no loss of balance. Verbal cues to improve L heel strike. 24* supervision recommended for safety as family reports pt forgets to eat, take medications, and use his walker.   Follow Up Recommendations  ST-SNF then Home health PT;Supervision for mobility/OOB (family reports pt forgets to eat and take meds, increase in aide time recommended at home)     Equipment Recommendations  None recommended by PT    Recommendations for Other Services       Precautions / Restrictions Precautions Precautions: Fall Precaution Comments: pt denies falls in past year, however daughter states she thinks he does not tell when/if he falls Restrictions Weight Bearing Restrictions: No    Mobility  Bed Mobility Overal bed mobility: Modified Independent             General bed mobility comments: HOB up 20*, used rail  Transfers Overall transfer level: Modified independent Equipment used: Rolling walker (2 wheeled) Transfers: Sit to/from Stand Sit to Stand: Modified independent (Device/Increase time)            Ambulation/Gait Ambulation/Gait assistance: Modified independent (Device/Increase time) Ambulation Distance (Feet): 260 Feet Assistive device: Rolling walker (2 wheeled) Gait Pattern/deviations: Step-through pattern;Decreased step length - right;Decreased step length - left   Gait velocity interpretation: at or above normal speed for age/gender General Gait Details: steady with RW, no LOB, 0/4 dyspnea, noted decreased heel strike and  L foot slapping during initial contact with floor, with verbal cues for heel strike pt had improved control of L foot;  verbal cues to keep RW close while backing up to Science writer    Modified Rankin (Stroke Patients Only)       Balance Overall balance assessment: Modified Independent                                  Cognition Arousal/Alertness: Awake/alert Behavior During Therapy: WFL for tasks assessed/performed Overall Cognitive Status: Within Functional Limits for tasks assessed                      Exercises      General Comments General comments (skin integrity, edema, etc.): pt denies falls, daughter thinks he's fallen but not reported it      Pertinent Vitals/Pain Pain Assessment: No/denies pain    Home Living                      Prior Function            PT Goals (current goals can now be found in the care plan section) Acute Rehab PT Goals Patient Stated Goal: to go home, sing with choir PT Goal Formulation: With patient/family Time For Goal Achievement: 07/29/15 Potential to Achieve Goals: Good Progress towards PT goals: Progressing toward goals    Frequency       PT Plan Current plan remains appropriate    Co-evaluation             End of Session Equipment Utilized During Treatment:  Gait belt Activity Tolerance: Patient tolerated treatment well;No increased pain Patient left: in chair;with call bell/phone within reach;with family/visitor present     Time: [id number] PT Time Calculation (min) (ACUTE ONLY): 26 min  Charges:  $Gait Training: 8-22 mins $Therapeutic Activity: 8-22 mins                    G Codes:      Philomena Doheny 07/17/2015, 1:41 PM [phone]

## 2015-07-17 NOTE — Discharge Summary (Addendum)
Physician Discharge Summary  [name] [name] [id number] DOB: [birth year] DOA: 07/14/2015  PCP: [name], MD  Admit date: 07/14/2015 Discharge date: 07/18/2015  Time spent: <30 minutes  Recommendations for Outpatient Follow-up:  #1 Discharge home with HHPT and RN and outpt PCP follow up in 1 week. Please check renal function during outpt visit. Has appt with home hospice on 07/18/2015.   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 Patient had a progressive prostate cancer and per his oncologist he is recommended stopping treatment, providing supportive care with hospice involvement. (He has an appointment with the hospice team on 07/18/2015). Patient had blood will done at his PCP office which showed acute on chronic kidney disease. He has been increasingly weak at home and daughter also noticed him to be confused for the past week. Patient admitted to hospitalist  service.  Hospital Course:  Acute on chronic kidney disease stage III Creatinine worsened to 2.9 from a baseline of 1.5 1.9. Possibly in the setting of dehydration. Renal ultrasound shows moderate left-sided hydronephrosis. His renal function is slowly improving with IV hydration and stopping his metformin and ARB. - FeNa of 1.2. Creatinine of 2.0  this morning. -received sodium bicarbonate tablet for low bicarbonate levels.improved.  -instructed on increasing fluid intake  and avoid use of NSAIDs  Hypothyroidism:  -Continue home Synthroid  DM type 2 : A1C of 6.5. Will discontinue Actos and metformin given his renal dysfunction. Continue Amaryl.   Essential hypertension: -Discontinue  valsartan. Continue on amlodipine only.  End stage of Prostate cancer [facility]):  per Dr. Alen Blew, should start supportive care only, hospice enrollment and possible SNF placement. PT recommends HH. -has outpt hospice appt on 07/18/15.    CAD with hx of s/p of CABG  Continue aspirin, Lipitor  Dementia: -continue Donepezil and Namenda    Family Communication: Spoke with daughter at bedside Disposition Plan: Patient lives by himself and has underlying dementia requiring a lot of assistance. PT recommended HH. Daughter agrees to take him home with [facility].    Consultants:  None  Procedures:  Renal ultrasound  Antibiotics:  None  Discharge Exam: Filed Vitals:   07/16/15 2038 07/17/15 0549  BP: 135/63 157/65  Pulse: 58 64  Temp: 98.1 F (36.7 C) 97.9 F (36.6 C)  Resp: 18 20     General: Elderly male not in distress  HEENT: Moist mucosa  Chest: Clear bilaterally  CVS: Normal S1 and S2, systolic  murmur 3/6  GI: Soft, nontender, nondistended  Musculoskeletal: Warm, no edema  CNS: Alert and oriented   Discharge Instructions    Current Discharge Medication List    START taking these medications   Details  amLODipine (NORVASC) 10 MG tablet Take 1 tablet (10 mg total) by  mouth daily. Qty: 30 tablet, Refills: 0           CONTINUE these medications which have CHANGED   Details  traMADol (ULTRAM) 50 MG tablet Take 1 tablet (50 mg total) by mouth every 8 (eight) hours as needed. Qty: 30 tablet, Refills: 0      CONTINUE these medications which have NOT CHANGED   Details  acetaminophen (TYLENOL) 500 MG tablet Take 1,000 mg by mouth every 6 (six) hours as needed for pain.    aspirin EC 81 MG tablet Take 81 mg by mouth every morning.     atorvastatin (LIPITOR) 10 MG tablet TAKE 1 TABLET BY MOUTH EVERY DAY Qty: 90 tablet, Refills: 0    calcium carbonate-magnesium hydroxide (ROLAIDS) 334 MG CHEW Chew 1 tablet by mouth 3 (three) times daily as needed (indigestion/heartburn).     Calcium Citrate-Vitamin D (CITRACAL + D PO) Take 1 tablet by mouth daily.    Denosumab (XGEVA [place]) Inject into the skin every 30 (thirty) days. Injects "around the first part of each month."    donepezil (ARICEPT) 10 MG tablet TAKE 1 TABLET BY MOUTH DAILY Qty: 90 tablet, Refills: 3    glimepiride (AMARYL) 4 MG tablet Take 0.5 tablets (2 mg total) by mouth daily with breakfast. Qty: 30 tablet, Refills: 5    glucose blood (ACCU-CHEK AVIVA) test strip USE TO CHECK BLOOD SUGAR DAILY AND PRN Qty: 100 each, Refills: 6    leuprolide (LUPRON) 3.75 MG injection Inject 3.75 mg into the muscle every 4 (four) months.    levothyroxine (SYNTHROID, LEVOTHROID) 50 MCG tablet TAKE 1 TABLET BY MOUTH DAILY Qty: 90 tablet, Refills: 1    Liraglutide 18 MG/3ML SOPN Inject 0.3 mLs (1.8 mg total) into the skin every morning. Qty: 6 mL, Refills: 6    LYRICA 50 MG capsule TAKE 2 CAPSULES BY MOUTH EVERY MORNING THEN TAKE 1 CAPSULE BY MOUTH EVERY EVENING Qty: 90 capsule, Refills: 5    NAMENDA XR 28 MG CP24 24 hr capsule TAKE 1 CAPSULE BY MOUTH DAILY Qty: 30 capsule, Refills: 5    Nutritional Supplements (JUICE PLUS FIBRE PO) Take 6 capsules by mouth daily.     VESICARE 10 MG tablet Take 10 mg by  mouth daily.  Refills: 11    nitroGLYCERIN (NITROSTAT) 0.4 MG SL tablet Place 0.4 mg under the tongue every 5 (five) minutes as needed. Reported on 07/14/2015      STOP taking these medications     amLODipine-valsartan (EXFORGE) 10-320 MG per tablet      Insulin Pen Needle (CAREFINE PEN NEEDLES) 32G X 4 MM MISC      pioglitazone (ACTOS) 15 MG tablet      metFORMIN (GLUCOPHAGE) 1000 MG tablet        Allergies  Allergen Reactions  . Codeine Phosphate Other (See Comments)    mouth sores   Follow-up Information    Please follow up.   Why:  MD at SNF       The results of significant diagnostics from this hospitalization (including imaging, microbiology, ancillary and laboratory) are listed below for reference.    Significant Diagnostic Studies: US Renal  07/15/2015  CLINICAL DATA:  Acute kidney injury. Known prostate carcinoma with metastatic disease. EXAM: RENAL / URINARY TRACT ULTRASOUND COMPLETE COMPARISON:  None. FINDINGS: Right Kidney: Length: 9.7 cm. Borderline increased renal parenchymal echogenicity. Diffuse cortical thinning. No mass or stone. No hydronephrosis. Left Kidney: Length: 12.1 cm. Normal parenchymal echogenicity. Mild renal cortical thinning. No masses. Moderate hydronephrosis. Proximal ureter is also dilated. Bladder: No bladder mass or wall thickening. Left ureteral jet not visualized. IMPRESSION: 1. Moderate left hydroureteronephrosis of unclear etiology. 2. Mild bilateral renal cortical thinning. Borderline increased renal parenchymal echogenicity on the right suggesting medical renal disease. No right hydronephrosis. No renal masses. Electronically Signed   By: Lajean Manes M.D.   On: 07/15/2015 11:07    Microbiology: No results found for this or any previous visit (from the past 240 hour(s)).   Labs: Basic Metabolic Panel:  Recent Labs Lab 07/13/15 1534 07/14/15 2058 07/15/15 0515 07/16/15 0605 07/17/15 0525  NA 139 138 142 142 145  K 4.0 3.8  3.6 4.0 4.2  CL  --  110 115* 117* 118*  CO2 19* 19* 19* 16* 17*  GLUCOSE 322* 109* 170* 191* 183*  BUN 39.7* 42* 39* 31* 28*  CREATININE 2.9* 2.82* 2.63* 2.21* 2.17*  CALCIUM 8.5 7.9* 8.0* 7.8* 8.0*   Liver Function Tests:  Recent Labs Lab 07/13/15 1534 07/14/15 2058  AST 18 21  ALT 14 18  ALKPHOS 226* 177*  BILITOT 0.54 0.9  PROT 7.2 6.5  ALBUMIN 3.4* 3.4*   No results for input(s): LIPASE, AMYLASE in the last 168 hours. No results for input(s): AMMONIA in the last 168 hours. CBC:  Recent Labs Lab 07/13/15 1533 07/14/15 2058 07/15/15 0515 07/16/15 0605  WBC 3.1* 3.3* 2.4* 2.3*  NEUTROABS 2.0  --   --   --   HGB 10.2* 9.3* 7.9* 8.2*  HCT 30.4* 27.8* 24.2* 25.1*  MCV 92.7 92.7 95.3 95.8  PLT 160 161 143* 139*   Cardiac Enzymes: No results for input(s): CKTOTAL, CKMB, CKMBINDEX, TROPONINI in the last 168 hours. BNP: BNP (last 3 results) No results for input(s): BNP in the last 8760 hours.  ProBNP (last 3 results) No results for input(s): PROBNP in the last 8760 hours.  CBG:  Recent Labs Lab 07/16/15 1129 07/16/15 1700 07/16/15 2037 07/17/15 0834 07/17/15 1147  GLUCAP 173* 153* 167* 183* 197*       Signed:  Louellen Molder MD.  [facility] 07/17/2015, 12:16 PM

## 2015-07-17 NOTE — Telephone Encounter (Signed)
Called and discussed

## 2015-07-18 LAB — BASIC METABOLIC PANEL
Anion gap: 9 (ref 5–15)
BUN: 26 mg/dL — ABNORMAL HIGH (ref 6–20)
CHLORIDE: 116 mmol/L — AB (ref 101–111)
CO2: 19 mmol/L — AB (ref 22–32)
CREATININE: 2 mg/dL — AB (ref 0.61–1.24)
Calcium: 8.4 mg/dL — ABNORMAL LOW (ref 8.9–10.3)
GFR calc non Af Amer: 29 mL/min — ABNORMAL LOW (ref >60)
GFR, EST AFRICAN AMERICAN: 34 mL/min — AB (ref >60)
Glucose, Bld: 187 mg/dL — ABNORMAL HIGH (ref 65–99)
Potassium: 4.2 mmol/L (ref 3.5–5.1)
Sodium: 144 mmol/L (ref 135–145)

## 2015-07-18 LAB — GLUCOSE, CAPILLARY
Glucose-Capillary: 200 mg/dL — ABNORMAL HIGH (ref 65–99)
Glucose-Capillary: 133 mg/dL — ABNORMAL HIGH (ref 65–99)

## 2015-07-18 MED ORDER — NAPHAZOLINE-GLYCERIN 0.012-0.2 % OP SOLN
1.0000 [drp] | Freq: Four times a day (QID) | OPHTHALMIC | Status: DC | PRN
  Filled 2015-07-18: qty 15

## 2015-07-18 NOTE — Progress Notes (Signed)
[facility] PROGRESS NOTE  [name] [name] [id number] DOB: 09-06-1929 DOA: 07/14/2015 PCP: [name], MD  Assessment/Plan: Acute on chronic kidney disease stage III creatinine Improved to 2 this am. Needs follow up with PCP in 1 week. instructed on adequate hydration and avoid OTC NSAIDs.  Hypothyroidism:  -Continue home Synthroid  DM type 2 : A1C of 6.5.  discontinued Actos and metformin given his renal dysfunction. Continue Amaryl.   Essential hypertension: -Discontinue valsartan. Continue on amlodipine only.  End stage of Prostate cancer [facility]):  per Dr. Alen Blew, should start supportive care only, hospice enrollment and possible SNF placement. PT recommends HH. -has outpt hospice appt tomorrow  HPI/Subjective: No overnight issues  Objective: Filed Vitals:   07/18/15 0433 07/18/15 0600  BP: 175/80 163/87  Pulse: 61 62  Temp: 98.4 F (36.9 C) 98.6 F (37 C)  Resp: 20 18    Intake/Output Summary (Last 24 hours) at 07/18/15 1226 Last data filed at 07/18/15 0600  Gross per 24 hour  Intake   2760 ml  Output      0 ml  Net   2760 ml   Filed Weights   07/15/15 0037 07/15/15 0050  Weight: 84.868 kg (187 lb 1.6 oz) 84.868 kg (187 lb 1.6 oz)    Exam:   General: not in distress  HEENT: Moist mucosa  Chest: Clear bilaterally  CVS: Normal S1 and S2, systolic murmur 3/6  Musculoskeletal: Warm, no edema  Data Reviewed: Basic Metabolic Panel:  Recent Labs Lab 07/14/15 2058 07/15/15 0515 07/16/15 0605 07/17/15 0525 07/18/15 0430  NA 138 142 142 145 144  K 3.8 3.6 4.0 4.2 4.2  CL 110 115* 117* 118* 116*  CO2 19* 19* 16* 17* 19*  GLUCOSE 109* 170* 191* 183* 187*  BUN 42* 39* 31* 28* 26*  CREATININE 2.82* 2.63* 2.21* 2.17* 2.00*  CALCIUM 7.9* 8.0* 7.8* 8.0* 8.4*   Liver Function Tests:  Recent Labs Lab 07/13/15 1534 07/14/15 2058  AST 18 21  ALT 14 18  ALKPHOS 226* 177*  BILITOT 0.54 0.9  PROT 7.2 6.5  ALBUMIN 3.4* 3.4*    No results for input(s): LIPASE, AMYLASE in the last 168 hours. No results for input(s): AMMONIA in the last 168 hours. CBC:  Recent Labs Lab 07/13/15 1533 07/14/15 2058 07/15/15 0515 07/16/15 0605  WBC 3.1* 3.3* 2.4* 2.3*  NEUTROABS 2.0  --   --   --   HGB 10.2* 9.3* 7.9* 8.2*  HCT 30.4* 27.8* 24.2* 25.1*  MCV 92.7 92.7 95.3 95.8  PLT 160 161 143* 139*   Cardiac Enzymes: No results for input(s): CKTOTAL, CKMB, CKMBINDEX, TROPONINI in the last 168 hours. BNP (last 3 results) No results for input(s): BNP in the last 8760 hours.  ProBNP (last 3 results) No results for input(s): PROBNP in the last 8760 hours.  CBG:  Recent Labs Lab 07/17/15 1147 07/17/15 1645 07/17/15 2107 07/18/15 0741 07/18/15 1206  GLUCAP 197* 141* 147* 200* 133*    No results found for this or any previous visit (from the past 240 hour(s)).   Studies: No results found.  Scheduled Meds: . amLODipine  10 mg Oral Daily  . aspirin EC  81 mg Oral q morning - 10a  . atorvastatin  10 mg Oral Daily  . darifenacin  7.5 mg Oral Daily  . donepezil  10 mg Oral Daily  . heparin  5,000 Units Subcutaneous 3 times per day  . insulin aspart  0-9 Units Subcutaneous TID WC  .  levothyroxine  50 mcg Oral Daily  . memantine  28 mg Oral Daily  . sodium bicarbonate  650 mg Oral BID  . sodium chloride  3 mL Intravenous Q12H   Continuous Infusions: . sodium chloride 100 mL/hr at 07/18/15 0533      Time spent: 15 minutes    [name] [name]  [facility] Pager [phone]. If 7PM-7AM, please contact night-coverage at [email or web address], password [facility] 07/18/2015, 12:26 PM  LOS: 4 days

## 2015-07-18 NOTE — Progress Notes (Signed)
Notified by Dr. Truddie Hidden office of family request for [facility] at home after discharge. Chart and patient information currently under review to confirm hospice eligibility.   Spoke with daughter, son and patient at bedside to initiate education related to hospice philosophy, services and team approach to care. Family verbalized understanding of the information provided. Per discussion, plan is for discharge to home by personal vehicle with daughter and son today.   Please send signed completed DNR form home with patient.   DME needs discussed and family denied any needs at this time.  [facility] aware of the above.  Completed discharge summary will need to be faxed to [facility] at [phone] when final.  Please notify [facility] when patient is ready to leave unit at discharge-call [phone].   [facility] information and contact numbers have been given to [facility] during visit.  Above information shared with Juliann Pulse, [facility].   Please call with any questions.  Freddi Starr RN, [facility] Liaison  [phone]

## 2015-07-18 NOTE — Progress Notes (Signed)
CSW received call from Mickel Baas @ [employer] informing that patient has been denied for SNF. Patient & daughter made aware - they have decided to return home with hospice services. Daughter to transport home.   No further CSW needs identified - CSW signing off.   Raynaldo Opitz, [facility] Clinical Social Worker cell #: [phone]

## 2015-07-18 NOTE — Care Management Note (Signed)
Case Management Note  Patient Details  Name: [name] [name] MRN: [id number] Date of Birth: 07-21-1929  Subjective/Objective:  Noted patient for d/c home w/[facility] services-MD/family in agreement. [facility] rep Erline Levine already following.   CSW aware.               Action/Plan:d/c plan home w/[facility].   Expected Discharge Date:                  Expected Discharge Plan:  Home w Hospice Care  In-House Referral:  Clinical Social Work  Discharge planning Services  CM Consult  Post Acute Care Choice:    Choice offered to:  Patient  DME Arranged:    DME Agency:     HH Arranged:    [place] Agency:  [facility]  Status of Service:  Completed, signed off  Medicare Important Message Given:  Yes Date Medicare IM Given:    Medicare IM give by:    Date Additional Medicare IM Given:    Additional Medicare Important Message give by:     If discussed at [place] of Stay Meetings, dates discussed:    Additional Comments:  Dessa Phi, RN 07/18/2015, 12:32 PM

## 2015-07-19 ENCOUNTER — Other Ambulatory Visit: Payer: Self-pay | Admitting: Internal Medicine

## 2015-07-19 ENCOUNTER — Telehealth: Payer: Self-pay | Admitting: Internal Medicine

## 2015-07-19 MED ORDER — AMLODIPINE BESYLATE 10 MG PO TABS: 10.0000 mg | ORAL_TABLET | Freq: Every day | ORAL | Status: DC

## 2015-07-19 MED ORDER — INSULIN GLARGINE 100 UNIT/ML SOLOSTAR PEN: 10.0000 [IU] | PEN_INJECTOR | Freq: Every day | SUBCUTANEOUS | Status: DC

## 2015-07-19 NOTE — Telephone Encounter (Signed)
Dr.K, pt sent home with out Rx for Lantus can you please prescibe.

## 2015-07-19 NOTE — Telephone Encounter (Signed)
Son said they are looking for two prescription that the pharmacist does not have.  1) Amlodieine  2) Lantus

## 2015-07-19 NOTE — Telephone Encounter (Signed)
Spoke to pt's daughter [name], told her Rx for Amlodipine was sent to pharmacy on 1/16. [name] said they do not have it. Told her okay I can send it, but I do not have a Rx for Lantus will have to get orders from Dr.K and then I will send both Rx's to pharmacy. [name] verbalized understanding.

## 2015-07-20 ENCOUNTER — Telehealth: Payer: Self-pay | Admitting: Internal Medicine

## 2015-07-20 NOTE — Telephone Encounter (Signed)
Prior Authorization for [name] [name] XR 28 MG CP24 24 hr capsule has been denied due to:  [name] [name] not being prescribed in accordance with an FDA labeled use or use accepted by the Medicare Approved drug compendia.

## 2015-07-21 NOTE — Telephone Encounter (Signed)
See message and advise

## 2015-07-24 ENCOUNTER — Other Ambulatory Visit: Payer: Self-pay | Admitting: Neurology

## 2015-07-24 ENCOUNTER — Telehealth: Payer: Self-pay | Admitting: Internal Medicine

## 2015-07-24 NOTE — Telephone Encounter (Signed)
FYI, see message. 

## 2015-07-24 NOTE — Telephone Encounter (Signed)
RN Kenney Houseman calling to update you on his CBG's. Fasting AM CBG is ranging between 190 and 250. His evening CBG ranges between 230-315. Lantus 10u at night between 7:30 and 8PM is his current dose of insulin.

## 2015-07-26 NOTE — Telephone Encounter (Signed)
Increase Lantus to 18 units

## 2015-07-26 NOTE — Telephone Encounter (Signed)
Spoke to [name] [name], told him Dr. Raliegh Ip said to increase Lantus to 18 units at bedtime. [name] [name] verbalized understanding.

## 2015-07-26 NOTE — Telephone Encounter (Signed)
FYI

## 2015-07-26 NOTE — Telephone Encounter (Signed)
Terri Coltrane , cna that stays with pt, called to report the increase from 10 ml to 14 has brought his blood sugar down. 1/23   207 1/24   140 1/25   166  [id number] terri cell phone.   But changes if needed to go through hospice she states

## 2015-07-30 ENCOUNTER — Other Ambulatory Visit: Payer: Self-pay | Admitting: Internal Medicine

## 2015-08-04 ENCOUNTER — Telehealth: Payer: Self-pay | Admitting: Internal Medicine

## 2015-08-04 NOTE — Telephone Encounter (Signed)
[name] [name] called back from Hospice, told her I will pass sugar readings on to Dr.K but they do not look bad. As far as injection would need to contact Oncology or discuss with pt's family if to continue. Dr. Raliegh Ip thought they were stopping everything. [name] [name] verbalized understanding.

## 2015-08-04 NOTE — Telephone Encounter (Signed)
R.K, see pt's sugar readings since increase in insulin.

## 2015-08-04 NOTE — Telephone Encounter (Signed)
Left message on voicemail to call office.  

## 2015-08-04 NOTE — Telephone Encounter (Signed)
Tonya w/ hospice called to advise  1. Since pt's increase of Insulin Glargine (LANTUS SOLOSTAR) 100 UNIT/ML on 1/23, his blood sugars are running 140-286.  2. Pt has appointment to have Xgeva injection on 2/21 and it that something pt needs to continue since he is now on hospice?

## 2015-08-07 NOTE — Telephone Encounter (Signed)
Please call hospice and  Ask to increase daily nighttime insulin to 20 units ( I talked to Mr. Vickki Muff and he does not recall his prior insulin dose)

## 2015-08-07 NOTE — Telephone Encounter (Signed)
Left detailed message on voicemail to increase daily nighttime insulin to 20 units per Dr.K. He talked to Mr. Vickki Muff and he does not recall his prior insulin dose. Any questions please call office.

## 2015-08-14 ENCOUNTER — Encounter: Payer: Self-pay | Admitting: Internal Medicine

## 2015-08-14 ENCOUNTER — Ambulatory Visit (INDEPENDENT_AMBULATORY_CARE_PROVIDER_SITE_OTHER): Payer: Medicare Other | Admitting: Internal Medicine

## 2015-08-14 VITALS — BP 150/70 | HR 53 | Temp 97.8°F | Resp 18 | Ht 67.0 in | Wt 192.0 lb

## 2015-08-14 DIAGNOSIS — G3184 Mild cognitive impairment, so stated: Secondary | ICD-10-CM

## 2015-08-14 DIAGNOSIS — Z794 Long term (current) use of insulin: Secondary | ICD-10-CM

## 2015-08-14 DIAGNOSIS — E0842 Diabetes mellitus due to underlying condition with diabetic polyneuropathy: Secondary | ICD-10-CM | POA: Diagnosis not present

## 2015-08-14 MED ORDER — INSULIN GLARGINE 100 UNIT/ML SOLOSTAR PEN: PEN_INJECTOR | SUBCUTANEOUS | Status: DC

## 2015-08-14 NOTE — Patient Instructions (Signed)
Increase nighttime insulin to 26 units daily  Goal  120  -160   Return in 2 months for follow-up

## 2015-08-14 NOTE — Progress Notes (Signed)
Subjective:    Patient ID: [name] [name], male    DOB: 29-May-1930, 82 y.o.   MRN: [id number]  HPI  Lab Results  Component Value Date   HGBA1C 6.5* 07/15/2015    82 year old patient who is seen today for follow-up of diabetes.  He has been on basal insulin 20 units and more recently blood sugars have trended up and are now consistently over 100.  His appetite has improved and he is eating better  Wt Readings from Last 3 Encounters:  08/14/15 192 lb (87.091 kg)  07/15/15 187 lb 1.6 oz (84.868 kg)  07/13/15 187 lb 8 oz (85.049 kg)    Past Medical History  Diagnosis Date  . Hypertension   . Hyperlipidemia   . Memory loss     mild  . Rectal ulcer     RESOLVED  . Constipation   . History of ventricular fibrillation 1998    while on treadmill  . Hypothyroidism   . IBS (irritable bowel syndrome)   . Colitis     RESOLVED  . ED (erectile dysfunction)   . Osteoporosis   . Depression   . Diabetes mellitus     fasting blood sugar 140-200 usually  . GERD (gastroesophageal reflux disease)   . Heart attack ([place]) 2009  . Dementia   . Neuromuscular disorder ([place])     DIABETIC NEUROPATHY  . Prostate cancer ([place]) 2007  . Pain     LOWER BACK  -- PAST HX OF STEROID EPIDURALS FOR PAIN RELIEF  . 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 CAD (coronary artery disease)     a. S/P CABG x 5 1998; b.  S/P stent LCX 2009;  c.  LHC 5/09: RCA proximal 90%, 95%, distally occluded, pCFX stents patent, OM1 30%, small left atrial branch 95%, oLAD occluded, S-dRCA patent, L-LAD/Dx patent, EF 60%=> Med Rx  ;  d.  Lex MV 3/13:  EF 63%, no ischemia  e.  Lex Myoview 03/08/13:  EF 56%, inf and apical defect suggestive of thinning and soft tissue attenuation, no ischemia; low risk.    . Bone metastases ([place])   . S/P radiation therapy 07/17/2005 - 09/10/2005    Prostate  7800 cGy, 40 Sessions  . Cataract     bil eyes  . Renal disorder     Social History   Social History  . Marital Status: Widowed    Spouse Name: N/A  . Number of Children: 4  . Years of Education: N/A   Occupational History  . retired    Social History Main Topics  . Smoking status: Former Research scientist (life sciences)  . Smokeless tobacco: Former Systems developer    Quit date: 02/11/1948  . Alcohol Use: No  . Drug Use: No  . Sexual Activity: Not on file   Other Topics Concern  . Not on file   Social History Narrative   Right handed, Widowed, 4 kids, Caffeine 2 cups daily, occa soda.  Retired, Chief Executive Officer    Past Surgical History  Procedure Laterality Date  . Coronary artery bypass graft  1998    x5, with a left internal mammary to the LAD and diagonal, vein graft to the OM1 and 2, and a vein graft to the right coronary artery  . Coronary stent placement  11/13/2007    LCX  . Trigger finger repaired  2009  . Laparoscopic gastrectomy  N/A 08/18/2012    Procedure: Laparoscopic Partial Gastrectomy;  Surgeon: [name], MD;  Location: [facility];  Service: General;  Laterality: N/A;  Laparoscopic Partial Gastrectomy,  . Colonoscopy    . Stomach surgery    . Upper gastrointestinal endoscopy      Family History  Problem Relation Age of Onset  . Stroke Father   . Pneumonia Father   . Colon cancer Neg Hx   . Esophageal cancer Neg Hx   . Rectal cancer Neg Hx   . Stomach cancer Neg Hx     Allergies  Allergen Reactions  . Codeine Phosphate Other (See Comments)    mouth sores    Current Outpatient Prescriptions on File Prior to Visit  Medication Sig Dispense Refill  . ACCU-CHEK AVIVA PLUS test strip TEST BLOOD SUGAR ONE TO TWO TIMES DAILY 300 each 5  . acetaminophen (TYLENOL) 500 MG tablet Take 1,000 mg by mouth every 6 (six) hours as needed for pain.    [name] amLODipine (NORVASC) 10 MG tablet TAKE 1 TABLET(10 MG) BY MOUTH DAILY 90 tablet 0  . aspirin EC 81 MG tablet Take 81 mg by mouth every morning.       [name] atorvastatin (LIPITOR) 10 MG tablet TAKE 1 TABLET BY MOUTH EVERY DAY 90 tablet 0  . calcium carbonate-magnesium hydroxide (ROLAIDS) 334 MG CHEW Chew 1 tablet by mouth 3 (three) times daily as needed (indigestion/heartburn).     . Calcium Citrate-Vitamin D (CITRACAL + D PO) Take 1 tablet by mouth daily.    [name] donepezil (ARICEPT) 10 MG tablet TAKE 1 TABLET BY MOUTH DAILY 90 tablet 3  . levothyroxine (SYNTHROID, LEVOTHROID) 50 MCG tablet TAKE 1 TABLET BY MOUTH DAILY 90 tablet 1  . LYRICA 50 MG capsule TAKE 2 CAPSULES BY MOUTH EVERY MORNING AND 1 CAPSULE EVERY EVENING 90 capsule 4  . NAMENDA XR 28 MG CP24 24 hr capsule TAKE 1 CAPSULE BY MOUTH DAILY 30 capsule 5  . nitroGLYCERIN (NITROSTAT) 0.4 MG SL tablet Place 0.4 mg under the tongue every 5 (five) minutes as needed. Reported on 07/14/2015    . Nutritional Supplements (JUICE PLUS FIBRE PO) Take 6 capsules by mouth daily.     . traMADol (ULTRAM) 50 MG tablet Take 1 tablet (50 mg total) by mouth every 8 (eight) hours as needed. 30 tablet 0  . VESICARE 10 MG tablet Take 10 mg by mouth daily.   11  . [DISCONTINUED] chlorthalidone (HYGROTON) 25 MG tablet Take 12.5 mg by mouth daily.     No current facility-administered medications on file prior to visit.    BP 150/70 mmHg  Pulse 53  Temp(Src) 97.8 F (36.6 C) (Oral)  Resp 18  Ht 5\' 7"  (1.702 m)  Wt 192 lb (87.091 kg)  BMI 30.06 kg/m2  SpO2 98%     Review of Systems  Constitutional: Positive for fatigue. Negative for fever, chills and appetite change.  HENT: Negative for congestion, dental problem, ear pain, hearing loss, sore throat, tinnitus, trouble swallowing and voice change.   Eyes: Negative for pain, discharge and visual disturbance.  Respiratory: Negative for cough, chest tightness, wheezing and stridor.   Cardiovascular: Negative for chest pain, palpitations and leg swelling.  Gastrointestinal: Negative for nausea, vomiting, abdominal pain, diarrhea, constipation, blood in stool  and abdominal distention.  Genitourinary: Negative for urgency, hematuria, flank pain, discharge, difficulty urinating and genital sores.  Musculoskeletal: Negative for myalgias, back pain, joint swelling, arthralgias, gait problem and neck stiffness.  Skin: Negative for rash.  Neurological: Positive for weakness. Negative for dizziness, syncope, speech difficulty, numbness and headaches.  Hematological: Negative for adenopathy. Does not bruise/bleed easily.  Psychiatric/Behavioral: Positive for confusion and decreased concentration. Negative for behavioral problems and dysphoric mood. The patient is not nervous/anxious.        Objective:   Physical Exam  Constitutional: He appears well-nourished. No distress.  Blood pressure 140/74  Abdominal: Bowel sounds are normal. He exhibits no distension. There is no tenderness. There is no rebound.          Assessment & Plan:       Diabetes mellitus.  We'll uptitrate basal insulin to 26 units.  We'll continue checking fasting blood sugars.  He has been asked to contact the office in about 1 week for consideration of further insulin titration Metastatic prostate cancer Cognitive impairment  Recheck 2 months

## 2015-08-14 NOTE — Progress Notes (Signed)
Pre visit review using our clinic review tool, if applicable. No additional management support is needed unless otherwise documented below in the visit note. 

## 2015-08-16 LAB — HM DIABETES EYE EXAM

## 2015-08-21 ENCOUNTER — Encounter: Payer: Self-pay | Admitting: Internal Medicine

## 2015-08-21 ENCOUNTER — Other Ambulatory Visit: Payer: Self-pay | Admitting: Internal Medicine

## 2015-08-25 ENCOUNTER — Telehealth: Payer: Self-pay | Admitting: Internal Medicine

## 2015-08-25 NOTE — Telephone Encounter (Signed)
Spoke to [place], with Hospice, verbal order given to increase Lyrica to 100 mg twice a day. Tonya verbalized understanding.

## 2015-08-25 NOTE — Telephone Encounter (Signed)
Please see message and advise 

## 2015-08-25 NOTE — Telephone Encounter (Signed)
Tonya from hospice call to report the following   Continue with a lot of foot pain   pt presently takes lyrica every morning 100mg  then 50 mg at night. She is asking if it is possible to increase lyrica  2 weeks fasting blood sugar has between 86 and 267 bedtime 189 and -[phone]

## 2015-08-25 NOTE — Telephone Encounter (Signed)
Okay to increase Lyrica 100 mg twice a day

## 2015-09-19 ENCOUNTER — Other Ambulatory Visit: Payer: Self-pay | Admitting: Oncology

## 2015-09-19 ENCOUNTER — Telehealth: Payer: Self-pay | Admitting: Oncology

## 2015-09-19 DIAGNOSIS — C61 Malignant neoplasm of prostate: Secondary | ICD-10-CM

## 2015-09-19 NOTE — Telephone Encounter (Signed)
cld & spoke to [facility] and gave 3:00 appt for 3/24 for labs-adv appt added

## 2015-09-22 ENCOUNTER — Telehealth: Payer: Self-pay | Admitting: Oncology

## 2015-09-22 ENCOUNTER — Ambulatory Visit (HOSPITAL_BASED_OUTPATIENT_CLINIC_OR_DEPARTMENT_OTHER): Admitting: Oncology

## 2015-09-22 ENCOUNTER — Other Ambulatory Visit (HOSPITAL_BASED_OUTPATIENT_CLINIC_OR_DEPARTMENT_OTHER)

## 2015-09-22 VITALS — BP 160/70 | HR 64 | Temp 98.0°F | Resp 17 | Ht 67.0 in | Wt 197.0 lb

## 2015-09-22 DIAGNOSIS — C61 Malignant neoplasm of prostate: Secondary | ICD-10-CM

## 2015-09-22 DIAGNOSIS — C7951 Secondary malignant neoplasm of bone: Secondary | ICD-10-CM

## 2015-09-22 DIAGNOSIS — E291 Testicular hypofunction: Secondary | ICD-10-CM

## 2015-09-22 LAB — COMPREHENSIVE METABOLIC PANEL
ALK PHOS: 161 U/L — AB (ref 40–150)
ALT: 11 U/L (ref 0–55)
AST: 16 U/L (ref 5–34)
Albumin: 3.2 g/dL — ABNORMAL LOW (ref 3.5–5.0)
Anion Gap: 7 mEq/L (ref 3–11)
BUN: 35.8 mg/dL — AB (ref 7.0–26.0)
CALCIUM: 8.3 mg/dL — AB (ref 8.4–10.4)
CHLORIDE: 115 meq/L — AB (ref 98–109)
CO2: 19 meq/L — AB (ref 22–29)
CREATININE: 3.1 mg/dL — AB (ref 0.7–1.3)
EGFR: 18 mL/min/{1.73_m2} — ABNORMAL LOW (ref >90)
GLUCOSE: 291 mg/dL — AB (ref 70–140)
POTASSIUM: 4.6 meq/L (ref 3.5–5.1)
SODIUM: 142 meq/L (ref 136–145)
Total Bilirubin: 0.34 mg/dL (ref 0.20–1.20)
Total Protein: 6.9 g/dL (ref 6.4–8.3)

## 2015-09-22 LAB — CBC WITH DIFFERENTIAL/PLATELET
BASO%: 0.4 % (ref 0.0–2.0)
BASOS ABS: 0 10*3/uL (ref 0.0–0.1)
EOS%: 4.6 % (ref 0.0–7.0)
Eosinophils Absolute: 0.2 10*3/uL (ref 0.0–0.5)
HCT: 28.7 % — ABNORMAL LOW (ref 38.4–49.9)
HGB: 9.3 g/dL — ABNORMAL LOW (ref 13.0–17.1)
LYMPH#: 0.5 10*3/uL — AB (ref 0.9–3.3)
LYMPH%: 15 % (ref 14.0–49.0)
MCH: 29.2 pg (ref 27.2–33.4)
MCHC: 32.3 g/dL (ref 32.0–36.0)
MCV: 90.7 fL (ref 79.3–98.0)
MONO#: 0.3 10*3/uL (ref 0.1–0.9)
MONO%: 8.1 % (ref 0.0–14.0)
NEUT#: 2.5 10*3/uL (ref 1.5–6.5)
NEUT%: 71.9 % (ref 39.0–75.0)
Platelets: 149 10*3/uL (ref 140–400)
RBC: 3.17 10*6/uL — AB (ref 4.20–5.82)
RDW: 15.2 % — ABNORMAL HIGH (ref 11.0–14.6)
WBC: 3.5 10*3/uL — ABNORMAL LOW (ref 4.0–10.3)

## 2015-09-22 NOTE — Progress Notes (Signed)
Hematology and Oncology Follow Up Visit  [name] [name] [id number] [birth year] 82 y.o. 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 His initial diagnosis was in 2006 when he presented with a PSA of 6.26 the Gleason score 4+3 equals 7.  Prior Therapy:  He is status post definitive therapy with radiation and androgen deprivation completed in 2007. The patient developed recurrent disease in 2012 with a PSA rise up to 28 and T9 osseous metastasis and was treated with combined androgen deprivation with Lupron and Casodex. He had an excellent response with his PSA nadir down to 0.19. Most recently he developed a rise in his PSA up to 3.06 in July 2014 despite castrate levels of testosterone. His bone scan in July 2014 confirmed the presence of bony disease. Zytiga 1000 mg daily started in July 2014 till July 2015.  Xtandi 160 mg daily started in July of 2015 to April 2016. Xofigo started on 11/03/2014. He is status post 6 injections completed in October 2016.  Current therapy:  He receives [place] monthly at [facility] urology. He receives Lupron every 4 months at [facility] urology. He will be on supportive care only not a candidate for any further therapy.  Interim History:  Mr. Szafran returns today for a followup visit with his son and daughter. Since the last visit, he reports no major changes in his health. He does have periods of decline but also at times he is able to function reasonably well and thrive. He does have home care during the daytime and family stays with him the majority of the evening. He did have an episode last week with confusion, word finding difficulties and possible TIA. This have completely resolved at this time. He reports no pain at this time and overall very comfortable.   He continues to have memory issues and at times  mental status changes associated with his progressive dementia. He has not reported any recent falls or syncope. Has not reported any loss of his appetite and continues to sleep well.  He does not report any headaches, blurry vision, syncope or seizures. Does not report any fevers, chills, sweats or weight loss. Does not report any chest pain, palpitation orthopnea. He does not report any lower extremity edema or difficulty breathing. No abdominal pain, nausea, vomiting. Has not reported any of the fluid retention or any bone pain. Did not report any genitourinary bleeding or hemoptysis. Remainder of his review of systems unremarkable.  Medications: I have reviewed the patient's current medications.  Current Outpatient Prescriptions  Medication Sig Dispense Refill  . ACCU-CHEK AVIVA PLUS test strip TEST BLOOD SUGAR ONE TO TWO TIMES DAILY 300 each 5  . acetaminophen (TYLENOL) 500 MG tablet Take 1,000 mg by mouth every 6 (six) hours as needed for pain.    [name] amLODipine (NORVASC) 10 MG tablet TAKE 1 TABLET(10 MG) BY MOUTH DAILY 90 tablet 0  . aspirin EC 81 MG tablet Take 81 mg by mouth every morning.     [name] atorvastatin (LIPITOR) 10 MG tablet TAKE 1 TABLET BY MOUTH EVERY DAY 90 tablet 0  . calcium carbonate-magnesium hydroxide (ROLAIDS) 334 MG CHEW Chew 1 tablet by mouth 3 (three) times daily as needed (indigestion/heartburn).     . Calcium Citrate-Vitamin D (CITRACAL + D PO) Take 1 tablet by mouth daily.    [name] donepezil (ARICEPT) 10 MG tablet TAKE 1 TABLET BY  MOUTH DAILY 90 tablet 3  . Insulin Glargine (LANTUS SOLOSTAR) 100 UNIT/ML Solostar Pen 26 units at bedtime 5 pen PRN  . levothyroxine (SYNTHROID, LEVOTHROID) 50 MCG tablet TAKE 1 TABLET BY MOUTH DAILY 90 tablet 1  . LYRICA 50 MG capsule TAKE 2 CAPSULES BY MOUTH EVERY MORNING AND 1 CAPSULE EVERY EVENING 90 capsule 4  . NAMENDA XR 28 MG CP24 24 hr capsule TAKE 1 CAPSULE BY MOUTH DAILY 30 capsule 5  . nitroGLYCERIN (NITROSTAT) 0.4 MG SL tablet Place  0.4 mg under the tongue every 5 (five) minutes as needed. Reported on 07/14/2015    . Nutritional Supplements (JUICE PLUS FIBRE PO) Take 6 capsules by mouth daily.     . traMADol (ULTRAM) 50 MG tablet Take 1 tablet (50 mg total) by mouth every 8 (eight) hours as needed. 30 tablet 0  . VESICARE 10 MG tablet Take 10 mg by mouth daily.   11  . [DISCONTINUED] chlorthalidone (HYGROTON) 25 MG tablet Take 12.5 mg by mouth daily.     No current facility-administered medications for this visit.     Allergies:  Allergies  Allergen Reactions  . Codeine Phosphate Other (See Comments)    mouth sores    Past Medical History, Surgical history, Social history, and Family History were reviewed and updated.    Physical Exam: Blood pressure 160/70, pulse 64, temperature 98 F (36.7 C), temperature source Oral, resp. rate 17, height 5\' 7"  (1.702 m), weight 197 lb (89.359 kg), SpO2 99 %. ECOG: 1 General appearance: Elderly gentleman without distress. Head: Normocephalic, without obvious abnormality no oral ulcers or lesions. Neck: no adenopathy Lymph nodes: Cervical, supraclavicular, and axillary nodes normal. Heart:regular rate and rhythm, S1, S2 normal, no murmur, click, rub or gallop  Lung:chest clear, no wheezing, rales.  Abdomen: soft, non-tender, without masses or organomegaly. No rebound or guarding. EXT:no erythema, induration, or nodules.  Neurological examination: No deficits.   Lab Results: Lab Results  Component Value Date   WBC 3.5* 09/22/2015   HGB 9.3* 09/22/2015   HCT 28.7* 09/22/2015   MCV 90.7 09/22/2015   PLT 149 09/22/2015     Chemistry      Component Value Date/Time   NA 142 09/22/2015 1502   NA 144 07/18/2015 0430   K 4.6 09/22/2015 1502   K 4.2 07/18/2015 0430   CL 116* 07/18/2015 0430   CO2 19* 09/22/2015 1502   CO2 19* 07/18/2015 0430   BUN 35.8* 09/22/2015 1502   BUN 26* 07/18/2015 0430   CREATININE 3.1* 09/22/2015 1502   CREATININE 2.00* 07/18/2015 0430    CREATININE 1.38* 02/11/2012 1614      Component Value Date/Time   CALCIUM 8.3* 09/22/2015 1502   CALCIUM 8.4* 07/18/2015 0430   ALKPHOS 161* 09/22/2015 1502   ALKPHOS 177* 07/14/2015 2058   AST 16 09/22/2015 1502   AST 21 07/14/2015 2058   ALT 11 09/22/2015 1502   ALT 18 07/14/2015 2058   BILITOT 0.34 09/22/2015 1502   BILITOT 0.9 07/14/2015 2058     Lab Results  Component Value Date   PSA 81.72* 07/13/2015   PSA 49.67* 06/14/2015   PSA 17.94* 04/07/2015     Impression and Plan: This is an 82 year old gentleman with the following issues: 1. 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 His initial diagnosis dates back to 2006 with PSA of 6.26 and a Gleason score of 7. He progressed on [place].   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 He received a total 6 treatments completed in October 2016. His PSA had increased up to 49 and bone scan showed progression of disease which was discussed with the family on his previous visit.  He is currently receiving supportive care only given the progression of his disease and lack of effective options. He is thriving reasonably well off therapy and I have recommended continuing to do so. He is getting help at home with his family and home care which helping him thrive of therapy.    2. Bony disease: He is receiving Xgeva at [facility] urology on a monthly basis. I have recommended stopping this in the near future.  3. Hormonal deprivation: He is receiving Lupron at [facility] urology every 4 months. I have recommended stopping these in the near future as well.  4. Advanced directives and end-of-life care: This was discussed again and he and his family understand that he has limited life expectancy of less than 6 months. Living well as well as CODE STATUS have been addressed.  5. Followup: He will return 8 weeks for an evaluation. He will cancel this appointment if needed to in the future.  [facility],  MD 3/24/20174:08 PM

## 2015-09-22 NOTE — Telephone Encounter (Signed)
per pf to sch pt appt-gave pt copy of avs °

## 2015-09-23 LAB — PSA (PARALLEL TESTING): PSA: 192.3 ng/mL — ABNORMAL HIGH (ref <=4.00)

## 2015-09-23 LAB — PSA: Prostate Specific Ag, Serum: 196.9 ng/mL — ABNORMAL HIGH (ref 0.0–4.0)

## 2015-09-28 ENCOUNTER — Telehealth: Payer: Self-pay | Admitting: Internal Medicine

## 2015-09-28 NOTE — Telephone Encounter (Signed)
Pt daughter sherry would like dr Raliegh Ip to review her dad kidney function blood work that was done at cancer center and call her back. Pt daughter is aware md not in office today

## 2015-09-29 NOTE — Telephone Encounter (Signed)
Called and discussed

## 2015-10-03 ENCOUNTER — Other Ambulatory Visit: Payer: Self-pay | Admitting: Internal Medicine

## 2015-10-09 ENCOUNTER — Encounter: Payer: Self-pay | Admitting: Internal Medicine

## 2015-10-09 ENCOUNTER — Ambulatory Visit (INDEPENDENT_AMBULATORY_CARE_PROVIDER_SITE_OTHER): Admitting: Internal Medicine

## 2015-10-09 VITALS — BP 160/70 | HR 63 | Temp 97.9°F | Resp 20 | Ht 67.0 in | Wt 194.0 lb

## 2015-10-09 DIAGNOSIS — N179 Acute kidney failure, unspecified: Secondary | ICD-10-CM

## 2015-10-09 DIAGNOSIS — E0842 Diabetes mellitus due to underlying condition with diabetic polyneuropathy: Secondary | ICD-10-CM | POA: Diagnosis not present

## 2015-10-09 DIAGNOSIS — Z794 Long term (current) use of insulin: Secondary | ICD-10-CM | POA: Diagnosis not present

## 2015-10-09 DIAGNOSIS — N183 Chronic kidney disease, stage 3 (moderate): Secondary | ICD-10-CM

## 2015-10-09 DIAGNOSIS — I10 Essential (primary) hypertension: Secondary | ICD-10-CM

## 2015-10-09 LAB — BASIC METABOLIC PANEL
BUN: 30 mg/dL — AB (ref 6–23)
CHLORIDE: 112 meq/L (ref 96–112)
CO2: 20 mEq/L (ref 19–32)
Calcium: 8.3 mg/dL — ABNORMAL LOW (ref 8.4–10.5)
Creatinine, Ser: 2.97 mg/dL — ABNORMAL HIGH (ref 0.40–1.50)
GFR: 21.58 mL/min — AB (ref >60.00)
Glucose, Bld: 254 mg/dL — ABNORMAL HIGH (ref 70–99)
Potassium: 4.6 mEq/L (ref 3.5–5.1)
Sodium: 141 mEq/L (ref 135–145)

## 2015-10-09 NOTE — Patient Instructions (Addendum)
Limit your sodium (Salt) intake   Please check your hemoglobin A1c every 3 months Pressure Injury A pressure injury, sometimes called a bedsore, is an injury to the skin and underlying tissue caused by pressure. Pressure on blood vessels causes decreased blood flow to the skin, which can eventually cause the skin tissue to die and break down into a wound. Pressure injuries usually occur:  Over bony parts of the body such as the tailbone, shoulders, elbows, hips, and heels.  Under medical devices such as respiratory equipment, stockings, tubes, and splints. Pressure injuries start as reddened areas on the skin and can lead to pain, muscle damage, and infection. Pressure injuries can vary in severity.  CAUSES Pressure injuries are caused by a lack of blood supply to an area of skin. They can occur from intense pressure over a short period of time or from less intense pressure over a long period of time. RISK FACTORS This condition is more likely to develop in people who:  Are in the hospital or an extended care facility.  Are bedridden or in a wheelchair.  Have an injury or disease that keeps them from:  Moving normally.  Feeling pain or pressure.  Have a condition that:  Makes them sleepy or less alert.  Causes poor blood flow.  Need to wear a medical device.  Have poor control of their bladder or bowel functions (incontinence).  Have poor nutrition (malnutrition).  Are of certain ethnicities. People of African American and Latino or Hispanic descent are at higher risk compared to other ethnic groups. If you are at risk for pressure ulcers, your health care provider may recommend certain types of bedding to help prevent them. These may include foam or gel mattresses covered with one of the following:  A sheepskin blanket.  A pad that is filled with gel, air, water, or foam. SYMPTOMS  The main symptom is a blister or change in skin color that opens into a wound. Other  symptoms include:   Red or dark areas of skin that do not turn white or pale when pressed with a finger.   Pain, warmth, or change of skin texture.  DIAGNOSIS This condition is diagnosed with a medical history and physical exam. You may also have tests, including:   Blood tests to check for infection or signs of poor nutrition.  Imaging studies to check for damage to the deep tissues under your skin.  Blood flow studies. Your pressure injury will be staged to determine its severity. Staging is an assessment of:  The depth of the pressure injury.  Which tissues are exposed because of the pressure injury.  The causes of the pressure injury. TREATMENT The main focus of treatment is to help your injury heal. This may be done by:   Relieving or redistributing pressure on your skin. This includes:  Frequently changing your position.  Eliminating or minimizing positions that caused the wound or that can make the wound worse.  Using specific bed mattresses and chair cushions.  Refitting, resizing, or replacing any medical devices, or padding the skin under them.  Using creams or powders to prevent rubbing (friction) on the skin.  Keeping your skin clean and dry. This may include using a skin cleanser or skin protectant as told by your health care provider. This may be a lotion, ointment, or spray.  Cleaning your injury and removing any dead tissue from the wound (debridement).  Placing a bandage (dressing) over your injury.  Preventing or treating infection.  This may include antibiotic, antimicrobial, or antiseptic medicines. Treatment may also include medicine for pain. Sometimes surgery is needed to close the wound with a flap of healthy skin or a piece of skin from another area of your body (graft). You may need surgery if other treatments are not working or if your injury is very deep. HOME CARE INSTRUCTIONS Wound Care  Follow instructions from your health care provider  about:  How to take care of your wound.  When and how you should change your dressing.  When you should remove your dressing. If your dressing is dry and stuck when you try to remove it, moisten or wet the dressing with saline or water so that it can be removed without harming your skin or wound tissue.  Check your wound every day for signs of infection. Have a caregiver do this for you if you are not able. Watch for:  More redness, swelling, or pain.  More fluid, blood, or pus.  A bad smell. Skin Care  Keep your skin clean and dry. Gently pat your skin dry.  Do not rub or massage your skin.  Use a skin protectant only as told by your health care provider.  Check your skin every day for any changes in color or any new blisters or sores (ulcers). Have a caregiver do this for you if you are not able. Medicines  Take over-the-counter and prescription medicines only as told by your health care provider.  If you were prescribed an antibiotic medicine, take it or apply it as told by your health care provider. Do not stop taking or using the antibiotic even if your condition improves. Reducing and Redistributing Pressure  Do not lie or sit in one position for a long time. Move or change position every two hours or as told by your health care provider.  Use pillows or cushions to reduce pressure. Ask your health care provider to recommend cushions or pads for you.  Use medical devices that do not rub your skin. Tell your health care provider if one of your medical devices is causing a pressure injury to develop. General Instructions  Eat a healthy diet that includes lots of protein. Ask your health care provider for diet advice.  Drink enough fluid to keep your urine clear or pale yellow.  Be as active as you can every day. Ask your health care provider to suggest safe exercises or activities.  Do not abuse drugs or alcohol.  Keep all follow-up visits as told by your health  care provider. This is important.  Do not smoke. SEEK MEDICAL CARE IF:  You have chills or fever.  Your pain medicine is not helping.  You have any changes in skin color.  You have new blisters or sores.  You develop warmth, redness, or swelling near a pressure injury.  You have a bad odor or pus coming from your pressure injury.  You lose control of your bowels or bladder.  You develop new symptoms.  Your wound does not improve after 1-2 weeks of treatment.  You develop a new medical condition, such as diabetes, peripheral vascular disease, or conditions that affect your defense (immune) system.   This information is not intended to replace advice given to you by your health care provider. Make sure you discuss any questions you have with your health care provider.   Document Released: 06/17/2005 Document Revised: 03/08/2015 Document Reviewed: 10/26/2014 Elsevier Interactive Patient Education [employer].

## 2015-10-09 NOTE — Progress Notes (Signed)
Pre visit review using our clinic review tool, if applicable. No additional management support is needed unless otherwise documented below in the visit note. 

## 2015-10-09 NOTE — Progress Notes (Signed)
Subjective:    Patient ID: [name] [name], male    DOB: 1930/03/03, 82 y.o.   MRN: [id number]  HPI  82 year old patient who is seen for follow-up.  He has type 2 diabetes.  He is followed closely by oncology and hospice due to metastatic prostate cancer. His prognosis is poor.  Lab Results  Component Value Date   HGBA1C 6.5* 07/15/2015    He has developed a small pressure ulceration involving his right buttock region in the intergluteal region.  He is followed closely by hospice and home health care In general doing reasonably well, although continues a slow clinical decline No significant pain  He has essential hypertension, coronary artery disease and hypothyroidism  Past Medical History  Diagnosis Date  . Hypertension   . Hyperlipidemia   . Memory loss     mild  . Rectal ulcer     RESOLVED  . Constipation   . History of ventricular fibrillation 1998    while on treadmill  . Hypothyroidism   . IBS (irritable bowel syndrome)   . Colitis     RESOLVED  . ED (erectile dysfunction)   . Osteoporosis   . Depression   . Diabetes mellitus     fasting blood sugar 140-200 usually  . GERD (gastroesophageal reflux disease)   . Heart attack ([place]) 2009  . Dementia   . Neuromuscular disorder ([place])     DIABETIC NEUROPATHY  . Prostate cancer ([place]) 2007  . Pain     LOWER BACK  -- PAST HX OF STEROID EPIDURALS FOR PAIN RELIEF  . 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 CAD (coronary artery disease)     a. S/P CABG x 5 1998; b.  S/P stent LCX 2009;  c.  LHC 5/09: RCA proximal 90%, 95%, distally occluded, pCFX stents patent, OM1 30%, small left atrial branch 95%, oLAD occluded, S-dRCA patent, L-LAD/Dx patent, EF 60%=> Med Rx  ;  d.  Lex MV 3/13:  EF 63%, no ischemia  e.  Lex Myoview 03/08/13:  EF 56%, inf and apical defect suggestive of thinning and  soft tissue attenuation, no ischemia; low risk.    . Bone metastases ([place])   . S/P radiation therapy 07/17/2005 - 09/10/2005    Prostate 7800 cGy, 40 Sessions  . Cataract     bil eyes  . Renal disorder     Social History   Social History  . Marital Status: Widowed    Spouse Name: N/A  . Number of Children: 4  . Years of Education: N/A   Occupational History  . retired    Social History Main Topics  . Smoking status: Former Research scientist (life sciences)  . Smokeless tobacco: Former Systems developer    Quit date: 02/11/1948  . Alcohol Use: No  . Drug Use: No  . Sexual Activity: Not on file   Other Topics Concern  . Not on file   Social History Narrative   Right handed, Widowed, 4 kids, Caffeine 2 cups daily, occa soda.  Retired, Chief Executive Officer    Past Surgical History  Procedure Laterality Date  . Coronary artery bypass graft  1998    x5, with a left internal mammary to the LAD and diagonal, vein graft to the OM1 and 2, and a vein graft to the right coronary artery  . Coronary stent placement  11/13/2007    LCX  .  Trigger finger repaired  2009  . Laparoscopic gastrectomy N/A 08/18/2012    Procedure: Laparoscopic Partial Gastrectomy;  Surgeon: [name], MD;  Location: [facility];  Service: General;  Laterality: N/A;  Laparoscopic Partial Gastrectomy,  . Colonoscopy    . Stomach surgery    . Upper gastrointestinal endoscopy      Family History  Problem Relation Age of Onset  . Stroke Father   . Pneumonia Father   . Colon cancer Neg Hx   . Esophageal cancer Neg Hx   . Rectal cancer Neg Hx   . Stomach cancer Neg Hx     Allergies  Allergen Reactions  . Codeine Phosphate Other (See Comments)    mouth sores    Current Outpatient Prescriptions on File Prior to Visit  Medication Sig Dispense Refill  . ACCU-CHEK AVIVA PLUS test strip TEST BLOOD SUGAR ONE TO TWO TIMES DAILY 300 each 5  . acetaminophen (TYLENOL) 500 MG tablet Take 1,000 mg by mouth every 6 (six) hours as needed for pain.    [name] amLODipine  (NORVASC) 10 MG tablet TAKE 1 TABLET(10 MG) BY MOUTH DAILY 90 tablet 0  . aspirin EC 81 MG tablet Take 81 mg by mouth every morning.     [name] atorvastatin (LIPITOR) 10 MG tablet TAKE 1 TABLET BY MOUTH EVERY DAY 90 tablet 1  . calcium carbonate-magnesium hydroxide (ROLAIDS) 334 MG CHEW Chew 1 tablet by mouth 3 (three) times daily as needed (indigestion/heartburn).     . Calcium Citrate-Vitamin D (CITRACAL + D PO) Take 1 tablet by mouth daily.    [name] donepezil (ARICEPT) 10 MG tablet TAKE 1 TABLET BY MOUTH DAILY 90 tablet 3  . Insulin Glargine (LANTUS SOLOSTAR) 100 UNIT/ML Solostar Pen 26 units at bedtime 5 pen PRN  . levothyroxine (SYNTHROID, LEVOTHROID) 50 MCG tablet TAKE 1 TABLET BY MOUTH DAILY 90 tablet 1  . LYRICA 50 MG capsule TAKE 2 CAPSULES BY MOUTH EVERY MORNING AND 1 CAPSULE EVERY EVENING 90 capsule 4  . NAMENDA XR 28 MG CP24 24 hr capsule TAKE 1 CAPSULE BY MOUTH DAILY 30 capsule 5  . nitroGLYCERIN (NITROSTAT) 0.4 MG SL tablet Place 0.4 mg under the tongue every 5 (five) minutes as needed. Reported on 07/14/2015    . Nutritional Supplements (JUICE PLUS FIBRE PO) Take 6 capsules by mouth daily.     . traMADol (ULTRAM) 50 MG tablet Take 1 tablet (50 mg total) by mouth every 8 (eight) hours as needed. 30 tablet 0  . VESICARE 10 MG tablet Take 10 mg by mouth daily.   11  . [DISCONTINUED] chlorthalidone (HYGROTON) 25 MG tablet Take 12.5 mg by mouth daily.     No current facility-administered medications on file prior to visit.    BP 160/70 mmHg  Pulse 63  Temp(Src) 97.9 F (36.6 C) (Oral)  Resp 20  Ht 5\' 7"  (1.702 m)  Wt 194 lb (87.998 kg)  BMI 30.38 kg/m2  SpO2 96%     Review of Systems  Constitutional: Positive for fatigue. Negative for fever, chills and appetite change.  HENT: Negative for congestion, dental problem, ear pain, hearing loss, sore throat, tinnitus, trouble swallowing and voice change.   Eyes: Negative for pain, discharge and visual disturbance.  Respiratory: Negative  for cough, chest tightness, wheezing and stridor.   Cardiovascular: Negative for chest pain, palpitations and leg swelling.  Gastrointestinal: Negative for nausea, vomiting, abdominal pain, diarrhea, constipation, blood in stool and abdominal distention.  Genitourinary: Negative for urgency,  hematuria, flank pain, discharge, difficulty urinating and genital sores.  Musculoskeletal: Positive for arthralgias and gait problem. Negative for myalgias, back pain, joint swelling and neck stiffness.  Skin: Positive for wound. Negative for rash.  Neurological: Negative for dizziness, syncope, speech difficulty, weakness, numbness and headaches.  Hematological: Negative for adenopathy. Does not bruise/bleed easily.  Psychiatric/Behavioral: Positive for confusion and decreased concentration. Negative for behavioral problems and dysphoric mood. The patient is not nervous/anxious.        Objective:   Physical Exam  Constitutional: He is oriented to person, place, and time. He appears well-developed.  HENT:  Head: Normocephalic.  Right Ear: External ear normal.  Left Ear: External ear normal.  Eyes: Conjunctivae and EOM are normal.  Neck: Normal range of motion.  Cardiovascular: Normal rate and normal heart sounds.   Pulmonary/Chest: Breath sounds normal.  Abdominal: Bowel sounds are normal.  Musculoskeletal: Normal range of motion. He exhibits no edema or tenderness.  Neurological: He is alert and oriented to person, place, and time.  Skin:  Superficial area skin breakdown right buttock area in the intergluteal region  Psychiatric: He has a normal mood and affect. His behavior is normal.          Assessment & Plan:   Metastatic prostate cancer.  Follow-up oncology Diabetes mellitus type 2 Chronic kidney disease.  There is been a recent increase in creatinine to 3.1.  We'll recheck today; encourage liberal fluid intake Pressure ulcer.  We'll continue local wound care  Recheck 3 months or  as needed Close follow-up oncology with hospice assistance

## 2015-10-11 ENCOUNTER — Encounter: Payer: Self-pay | Admitting: Gastroenterology

## 2015-10-17 ENCOUNTER — Emergency Department (HOSPITAL_COMMUNITY)

## 2015-10-17 ENCOUNTER — Encounter (HOSPITAL_COMMUNITY): Payer: Self-pay | Admitting: Emergency Medicine

## 2015-10-17 ENCOUNTER — Emergency Department (HOSPITAL_COMMUNITY)
Admission: EM | Admit: 2015-10-17 | Discharge: 2015-10-17 | Disposition: A | Discharge status: Home / Self Care | Attending: Emergency Medicine | Admitting: Emergency Medicine

## 2015-10-17 DIAGNOSIS — Y939 Activity, unspecified: Secondary | ICD-10-CM | POA: Insufficient documentation

## 2015-10-17 DIAGNOSIS — Z794 Long term (current) use of insulin: Secondary | ICD-10-CM | POA: Insufficient documentation

## 2015-10-17 DIAGNOSIS — M25511 Pain in right shoulder: Secondary | ICD-10-CM | POA: Diagnosis not present

## 2015-10-17 DIAGNOSIS — Z85830 Personal history of malignant neoplasm of bone: Secondary | ICD-10-CM | POA: Insufficient documentation

## 2015-10-17 DIAGNOSIS — W06XXXA Fall from bed, initial encounter: Secondary | ICD-10-CM | POA: Diagnosis not present

## 2015-10-17 DIAGNOSIS — N289 Disorder of kidney and ureter, unspecified: Secondary | ICD-10-CM

## 2015-10-17 DIAGNOSIS — Z79899 Other long term (current) drug therapy: Secondary | ICD-10-CM | POA: Diagnosis not present

## 2015-10-17 DIAGNOSIS — Y999 Unspecified external cause status: Secondary | ICD-10-CM | POA: Diagnosis not present

## 2015-10-17 DIAGNOSIS — E039 Hypothyroidism, unspecified: Secondary | ICD-10-CM | POA: Insufficient documentation

## 2015-10-17 DIAGNOSIS — F329 Major depressive disorder, single episode, unspecified: Secondary | ICD-10-CM | POA: Insufficient documentation

## 2015-10-17 DIAGNOSIS — Z8546 Personal history of malignant neoplasm of prostate: Secondary | ICD-10-CM | POA: Diagnosis not present

## 2015-10-17 DIAGNOSIS — F0390 Unspecified dementia without behavioral disturbance: Secondary | ICD-10-CM | POA: Insufficient documentation

## 2015-10-17 DIAGNOSIS — C799 Secondary malignant neoplasm of unspecified site: Secondary | ICD-10-CM

## 2015-10-17 DIAGNOSIS — Z7982 Long term (current) use of aspirin: Secondary | ICD-10-CM | POA: Insufficient documentation

## 2015-10-17 DIAGNOSIS — I2510 Atherosclerotic heart disease of native coronary artery without angina pectoris: Secondary | ICD-10-CM | POA: Diagnosis not present

## 2015-10-17 DIAGNOSIS — Y929 Unspecified place or not applicable: Secondary | ICD-10-CM | POA: Insufficient documentation

## 2015-10-17 DIAGNOSIS — Z87891 Personal history of nicotine dependence: Secondary | ICD-10-CM | POA: Diagnosis not present

## 2015-10-17 DIAGNOSIS — Z951 Presence of aortocoronary bypass graft: Secondary | ICD-10-CM | POA: Diagnosis not present

## 2015-10-17 DIAGNOSIS — I10 Essential (primary) hypertension: Secondary | ICD-10-CM | POA: Diagnosis not present

## 2015-10-17 DIAGNOSIS — E1140 Type 2 diabetes mellitus with diabetic neuropathy, unspecified: Secondary | ICD-10-CM | POA: Insufficient documentation

## 2015-10-17 DIAGNOSIS — I252 Old myocardial infarction: Secondary | ICD-10-CM | POA: Diagnosis not present

## 2015-10-17 DIAGNOSIS — E785 Hyperlipidemia, unspecified: Secondary | ICD-10-CM | POA: Insufficient documentation

## 2015-10-17 DIAGNOSIS — W19XXXA Unspecified fall, initial encounter: Secondary | ICD-10-CM

## 2015-10-17 LAB — URINALYSIS, ROUTINE W REFLEX MICROSCOPIC
BILIRUBIN URINE: NEGATIVE
GLUCOSE, UA: 250 mg/dL — AB
KETONES UR: NEGATIVE mg/dL
Leukocytes, UA: NEGATIVE
Nitrite: NEGATIVE
PH: 6.5 (ref 5.0–8.0)
Protein, ur: 100 mg/dL — AB
SPECIFIC GRAVITY, URINE: 1.011 (ref 1.005–1.030)

## 2015-10-17 LAB — URINE MICROSCOPIC-ADD ON

## 2015-10-17 LAB — CBC
HEMATOCRIT: 27.2 % — AB (ref 39.0–52.0)
Hemoglobin: 9.1 g/dL — ABNORMAL LOW (ref 13.0–17.0)
MCH: 28.6 pg (ref 26.0–34.0)
MCHC: 33.5 g/dL (ref 30.0–36.0)
MCV: 85.5 fL (ref 78.0–100.0)
Platelets: 161 10*3/uL (ref 150–400)
RBC: 3.18 MIL/uL — ABNORMAL LOW (ref 4.22–5.81)
RDW: 14.9 % (ref 11.5–15.5)
WBC: 5.2 10*3/uL (ref 4.0–10.5)

## 2015-10-17 LAB — BASIC METABOLIC PANEL
Anion gap: 8 (ref 5–15)
BUN: 33 mg/dL — AB (ref 6–20)
CALCIUM: 8.5 mg/dL — AB (ref 8.9–10.3)
CO2: 19 mmol/L — AB (ref 22–32)
CREATININE: 2.88 mg/dL — AB (ref 0.61–1.24)
Chloride: 107 mmol/L (ref 101–111)
GFR calc non Af Amer: 19 mL/min — ABNORMAL LOW (ref >60)
GFR, EST AFRICAN AMERICAN: 22 mL/min — AB (ref >60)
GLUCOSE: 138 mg/dL — AB (ref 65–99)
Potassium: 4.1 mmol/L (ref 3.5–5.1)
Sodium: 134 mmol/L — ABNORMAL LOW (ref 135–145)

## 2015-10-17 MED ORDER — SODIUM CHLORIDE 0.9 % IV BOLUS (SEPSIS)
1000.0000 mL | Freq: Once | INTRAVENOUS | Status: AC
  Administered 2015-10-17: 1000 mL via INTRAVENOUS

## 2015-10-17 NOTE — ED Notes (Signed)
Pt attempted to void but was un able to family wants him to void on his own and not be cath Rn made aware will attempt again

## 2015-10-17 NOTE — Progress Notes (Signed)
CSW attempted to meet with patient at bedside. However, patient is asleep. Daughter was present.  Daughter confirms that patient presents to [facility] due to fall. Also, she states that patient is from home and that he receives hospice care and has private duty care as well. Daughter states that the patient would prefer to stay at home for hospice care.  Daughter expresses no concerns at this time. She has no questions at this time.  Willette Brace [id number] ED CSW 10/17/2015 10:54 PM

## 2015-10-17 NOTE — Discharge Instructions (Signed)
Return to the ED with any concerns including vomiting, seizure activity, difficulty breathing, fainting, decreased level of alertness/lethargy, or any other alarming symptoms °

## 2015-10-17 NOTE — ED Notes (Signed)
Patient from home and transported by [facility]. Patient has end stage bone CA and is followed by hospice. Patient had a witnessed fall before 1400 and patient had another un-witnessed fall after care giver left, sometime between 1400 and 1700. EMS stated patient had slight disorientation. Patient told EMS he fell trying to get out of bed, however he was not near his bed. Family stated his first fall was trying to get out of bed. Patient denies neck or back pain. Patient reported some right shoulder pain to EMS. Patient has DNR and medication list. 12 lead EKG, cleared by spinal and cervical injury, and 20g LAC iv started by EMS. Family is with patient.

## 2015-10-17 NOTE — ED Provider Notes (Signed)
CSN: [id number]     Arrival date & time 10/17/15  1854 History   First MD Initiated Contact with Patient 10/17/15 1930     Chief Complaint  Patient presents with  . Fall     (Consider location/radiation/quality/duration/timing/severity/associated sxs/prior Treatment) HPI  Pt with hx of metastatic bone cancer under hospice care at home presents after 2 falls today.  He does not remember whether he had syncope but does remember falling.  Patient had a witnessed fall before 1400 and patient had another un-witnessed fall after care giver left, sometime between 1400 and 1700. EMS stated patient had slight disorientation. Patient told EMS he fell trying to get out of bed, however he was not near his bed. Family stated his first fall was trying to get out of bed. Patient denies neck or back pain. Patient reported some right shoulder pain to EMS. There are no other associated systemic symptoms, there are no other alleviating or modifying factors.   Past Medical History  Diagnosis Date  . Hypertension   . Hyperlipidemia   . Memory loss     mild  . Rectal ulcer     RESOLVED  . Constipation   . History of ventricular fibrillation 1998    while on treadmill  . Hypothyroidism   . IBS (irritable bowel syndrome)   . Colitis     RESOLVED  . ED (erectile dysfunction)   . Osteoporosis   . Depression   . Diabetes mellitus     fasting blood sugar 140-200 usually  . GERD (gastroesophageal reflux disease)   . Heart attack ([place]) 2009  . Dementia   . Neuromuscular disorder ([place])     DIABETIC NEUROPATHY  . Prostate cancer ([place]) 2007  . Pain     LOWER BACK  -- PAST HX OF STEROID EPIDURALS FOR PAIN RELIEF  . 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 CAD (coronary artery disease)     a. S/P CABG x 5 1998; b.  S/P stent LCX 2009;  c.  LHC 5/09: RCA proximal 90%,  95%, distally occluded, pCFX stents patent, OM1 30%, small left atrial branch 95%, oLAD occluded, S-dRCA patent, L-LAD/Dx patent, EF 60%=> Med Rx  ;  d.  Lex MV 3/13:  EF 63%, no ischemia  e.  Lex Myoview 03/08/13:  EF 56%, inf and apical defect suggestive of thinning and soft tissue attenuation, no ischemia; low risk.    . Bone metastases ([place])   . S/P radiation therapy 07/17/2005 - 09/10/2005    Prostate 7800 cGy, 40 Sessions  . Cataract     bil eyes  . Renal disorder    Past Surgical History  Procedure Laterality Date  . Coronary artery bypass graft  1998    x5, with a left internal mammary to the LAD and diagonal, vein graft to the OM1 and 2, and a vein graft to the right coronary artery  . Coronary stent placement  11/13/2007    LCX  . Trigger finger repaired  2009  . Laparoscopic gastrectomy N/A 08/18/2012    Procedure: Laparoscopic Partial Gastrectomy;  Surgeon: [name], MD;  Location: [facility];  Service: General;  Laterality: N/A;  Laparoscopic Partial Gastrectomy,  . Colonoscopy    . Stomach surgery    . Upper gastrointestinal endoscopy     Family History  Problem Relation Age of Onset  . Stroke  Father   . Pneumonia Father   . Colon cancer Neg Hx   . Esophageal cancer Neg Hx   . Rectal cancer Neg Hx   . Stomach cancer Neg Hx    Social History  Substance Use Topics  . Smoking status: Former Research scientist (life sciences)  . Smokeless tobacco: Former Systems developer    Quit date: 02/11/1948  . Alcohol Use: No    Review of Systems  ROS reviewed and all otherwise negative except for mentioned in HPI    Allergies  Codeine phosphate  Home Medications   Prior to Admission medications   Medication Sig Start Date End Date Taking? Authorizing Provider  amLODipine (NORVASC) 10 MG tablet TAKE 1 TABLET(10 MG) BY MOUTH DAILY 07/19/15  Yes Marletta Lor, MD  aspirin EC 81 MG tablet Take 81 mg by mouth every morning.    Yes Historical Provider, MD  atorvastatin (LIPITOR) 10 MG tablet TAKE 1 TABLET BY  MOUTH EVERY DAY 10/03/15  Yes Marletta Lor, MD  calcium carbonate-magnesium hydroxide (ROLAIDS) 334 MG CHEW Chew 1 tablet by mouth 3 (three) times daily as needed (indigestion/heartburn).    Yes Historical Provider, MD  Calcium Citrate-Vitamin D (CITRACAL + D PO) Take 1 tablet by mouth daily.   Yes Historical Provider, MD  donepezil (ARICEPT) 10 MG tablet TAKE 1 TABLET BY MOUTH DAILY 02/10/15  Yes Marletta Lor, MD  Insulin Glargine (LANTUS SOLOSTAR) 100 UNIT/ML Solostar Pen 26 units at bedtime 08/14/15  Yes Marletta Lor, MD  levothyroxine (SYNTHROID, LEVOTHROID) 50 MCG tablet TAKE 1 TABLET BY MOUTH DAILY 08/21/15  Yes Marletta Lor, MD  NAMENDA XR 28 MG CP24 24 hr capsule TAKE 1 CAPSULE BY MOUTH DAILY 04/28/15  Yes Marletta Lor, MD  Nutritional Supplements (JUICE PLUS FIBRE PO) Take 6 capsules by mouth daily.    Yes Historical Provider, MD  pregabalin (LYRICA) 100 MG capsule Take 100 mg by mouth 2 (two) times daily.   Yes Historical Provider, MD  traMADol (ULTRAM) 50 MG tablet Take 1 tablet (50 mg total) by mouth every 8 (eight) hours as needed. 07/17/15  Yes Nishant Dhungel, MD  VESICARE 10 MG tablet Take 10 mg by mouth daily.  09/05/14  Yes Historical Provider, MD  ACCU-CHEK AVIVA PLUS test strip TEST BLOOD SUGAR ONE TO TWO TIMES DAILY 08/01/15   Marletta Lor, MD  acetaminophen (TYLENOL) 500 MG tablet Take 1,000 mg by mouth every 6 (six) hours as needed for pain.    Historical Provider, MD  LYRICA 50 MG capsule TAKE 2 CAPSULES BY MOUTH EVERY MORNING AND 1 CAPSULE EVERY EVENING Patient not taking: Reported on 10/17/2015 07/24/15   Star Age, MD  nitroGLYCERIN (NITROSTAT) 0.4 MG SL tablet Place 0.4 mg under the tongue every 5 (five) minutes as needed. Reported on 07/14/2015    Historical Provider, MD   BP 162/72 mmHg  Pulse 61  Temp(Src) 98.7 F (37.1 C) (Oral)  Resp 18  Ht 5\' 7"  (1.702 m)  Wt 194 lb (87.998 kg)  BMI 30.38 kg/m2  SpO2 99%  Vitals  reviewed Physical Exam  Physical Examination: General appearance - alert, chronically ill appearing, and in no distress Mental status - alert, oriented to person, place, and time Head- NCAT Eyes - pupils equal and reactive, extraocular eye movements intact Mouth - mucous membranes moist, pharynx normal without lesions Neck - no midline tenderness, FROM without pain Chest - clear to auscultation, no wheezes, rales or rhonchi, symmetric air entry Heart - normal  rate, regular rhythm, normal S1, S2, no murmurs, rubs, clicks or gallops Abdomen - soft, nontender, nondistended, no masses or organomegaly Neurological - alert, oriented x 3, no cranial nerve defect, strength and sensation intact Musculoskeletal - bilateral shoulder pain with ROM,otherwise no joint tenderness, deformity or swelling Extremities - peripheral pulses normal, no pedal edema, no clubbing or cyanosis Skin - normal coloration and turgor, no rashes  ED Course  Procedures (including critical care time) Labs Review Labs Reviewed  CBC - Abnormal; Notable for the following:    RBC 3.18 (*)    Hemoglobin 9.1 (*)    HCT 27.2 (*)    All other components within normal limits  BASIC METABOLIC PANEL - Abnormal; Notable for the following:    Sodium 134 (*)    CO2 19 (*)    Glucose, Bld 138 (*)    BUN 33 (*)    Creatinine, Ser 2.88 (*)    Calcium 8.5 (*)    GFR calc non Af Amer 19 (*)    GFR calc Af Amer 22 (*)    All other components within normal limits  URINALYSIS, ROUTINE W REFLEX MICROSCOPIC (NOT AT [facility]) - Abnormal; Notable for the following:    Glucose, UA 250 (*)    Hgb urine dipstick TRACE (*)    Protein, ur 100 (*)    All other components within normal limits  URINE MICROSCOPIC-ADD ON - Abnormal; Notable for the following:    Squamous Epithelial / LPF 0-5 (*)    Bacteria, UA RARE (*)    All other components within normal limits    Imaging Review Dg Shoulder Right  10/17/2015  CLINICAL DATA:  Right shoulder  pain after unwitnessed fall. EXAM: RIGHT SHOULDER - 2+ VIEW COMPARISON:  None. FINDINGS: There is no evidence of fracture or dislocation. Severe degenerative change of right acromioclavicular joint is noted. Soft tissues are unremarkable. 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 No acute abnormality seen in the right shoulder. Electronically Signed   By: Marijo Conception, M.D.   On: 10/17/2015 21:06   Ct Head Wo Contrast  10/17/2015  CLINICAL DATA:  Post fall.  History of prostate cancer. EXAM: CT HEAD WITHOUT CONTRAST TECHNIQUE: Contiguous axial images were obtained from the base of the skull through the vertex without intravenous contrast. COMPARISON:  None. FINDINGS: Mild atrophy with sulcal prominence centralized volume loss with commensurate ex vacuo dilatation of the ventricular system. Scattered periventricular hypodensities compatible with microvascular ischemic disease. Bilateral basal ganglial calcifications. No CT evidence acute large territory infarct. No intraparenchymal or extra-axial mass or hemorrhage. Normal configuration of the ventricles and basilar cisterns. No midline shift. Intracranial atherosclerosis. Limited visualization the paranasal sinuses and mastoid air cells is normal. No air-fluid levels. Regional soft tissues appear normal. Post left-sided cataract surgery. No displaced calvarial fracture or definitive aggressive osseous lesions. IMPRESSION: Mild atrophy and microvascular ischemic disease without acute intracranial process. Electronically Signed   By: Sandi Mariscal M.D.   On: 10/17/2015 20:55   Dg Shoulder Left  10/17/2015  CLINICAL DATA:  Left shoulder pain after unwitnessed fall. EXAM: LEFT SHOULDER - 2+ VIEW COMPARISON:  None. FINDINGS: There is no evidence of fracture or dislocation. Degenerative change of the left acromioclavicular joint is noted. Soft tissues are unremarkable. IMPRESSION: Degenerative joint disease of the left  acromioclavicular joint. No acute abnormality seen in the left shoulder. Electronically Signed   By: Marijo Conception, M.D.   On: 10/17/2015 21:05   I  have personally reviewed and evaluated these images and lab results as part of my medical decision-making.   EKG Interpretation None      MDM   Final diagnoses:  Fall    Pt presenting after 2 falls today- workup is reassuring in the ED.  His primary complaint is of shoulder pain- xrays show chronic changes.  Pt is under hospice care and daughter is comfortable with taking him home- she has been in touch with hospice and they are aware of his falls today  He has a caregiver at home.   11:07 PM daughter has been in touch with hospice, case management has seen patient,  He is under hospice care- no further treatments and he is agreeable with plan for discharge to home.    Alfonzo Beers, MD 10/19/15 2109

## 2015-10-17 NOTE — Progress Notes (Signed)
EDCM spoke to patient and his daughter at bedside.  Patient gave permission to speak to his daughter.  Patient lives at home alone.  Daughter reports patient has someone who stays with him during the day and at night, but there are gaps during the day between 2-5pm and 7-10pm, but reports patient's family visit often and bring the patient food and stay for 2-3 hours to fill in the gaps.  Patient's daughter reports patient fell during one of these gaps.  Patient's daughter confirms patient is active with [facility] for hospice.  Patient is no longer receiving treatment for his cancer.  Patient has a walker, cane wheelchair, and bedside commode at home.  Patients' daughter reports the family is talking about placement for the patient.   She reports the patient would like to stay at home and reports one of the family members is totally against nursing home placement.  EDCM encouraged patient's daughter to speak to social worker for [facility] in regards to placement.  Patient's daughter verbalized understanding.  Patient's daughter reports the patient doesn't have any pain except in his feet from diabetic neuropathy.  No further EDCM needs at this time.

## 2015-10-18 ENCOUNTER — Telehealth: Payer: Self-pay | Admitting: Internal Medicine

## 2015-10-18 NOTE — Telephone Encounter (Signed)
Error

## 2015-11-06 ENCOUNTER — Telehealth: Payer: Self-pay

## 2015-11-06 ENCOUNTER — Ambulatory Visit: Payer: Medicare Other | Admitting: Neurology

## 2015-11-06 NOTE — Telephone Encounter (Signed)
Patient cancelled same day of appt.

## 2015-11-07 ENCOUNTER — Encounter: Payer: Self-pay | Admitting: Neurology

## 2015-11-07 ENCOUNTER — Telehealth: Payer: Self-pay | Admitting: Internal Medicine

## 2015-11-07 NOTE — Telephone Encounter (Signed)
Pt need to have a TB and want to know if he needs the Shingles shot.

## 2015-11-08 NOTE — Telephone Encounter (Signed)
Spoke to [place] told her pt just needs a nurse visit for TB testing but needs to be able to come back in 48 hours to have it read. [name] verbalized understanding. Also the Shingles vaccine you need to check with insurance because sometimes they make you to go the pharmacy to have it done so it is covered. [name] verbalized understanding and will call back and schedule nurse visit.

## 2015-11-13 ENCOUNTER — Ambulatory Visit (INDEPENDENT_AMBULATORY_CARE_PROVIDER_SITE_OTHER): Payer: Medicare Other | Admitting: *Deleted

## 2015-11-13 DIAGNOSIS — Z111 Encounter for screening for respiratory tuberculosis: Secondary | ICD-10-CM | POA: Diagnosis not present

## 2015-11-14 ENCOUNTER — Ambulatory Visit (INDEPENDENT_AMBULATORY_CARE_PROVIDER_SITE_OTHER): Admitting: Neurology

## 2015-11-14 ENCOUNTER — Encounter: Payer: Self-pay | Admitting: Neurology

## 2015-11-14 VITALS — BP 132/60 | HR 70 | Resp 16 | Ht 67.0 in | Wt 191.0 lb

## 2015-11-14 DIAGNOSIS — C7951 Secondary malignant neoplasm of bone: Secondary | ICD-10-CM

## 2015-11-14 DIAGNOSIS — C61 Malignant neoplasm of prostate: Secondary | ICD-10-CM

## 2015-11-14 DIAGNOSIS — F0390 Unspecified dementia without behavioral disturbance: Secondary | ICD-10-CM

## 2015-11-14 NOTE — Progress Notes (Signed)
Subjective:    Patient ID: [name] [name] is a 82 y.o. male.  HPI     Interim history:  [name] [name] is an 82 year old right-handed gentleman, with an underlying complex medical history of hypertension, hyperlipidemia, heart disease, status post 3 vessel CABG in 1998, stent placement in 2009, left heart catheter in 2009, history of V. fib, hypothyroidism, IBS, ED, osteoporosis, depression, diabetes with neuropathy, reflux disease, and prostate cancer (radiation treatment in 2005 with recurrence and evidence of bony metastasis), who presents for followup consultation of his memory loss and neuropathy. He is accompanied by his nurses aide, Coralyn Mark today. Of note, he missed an appointment for 11/06/2015. I last saw him on 05/08/2015, at which time he reported doing okay, he had some knee pain. He was undergoing prostate cancer treatments. He was walking some. Appetite was good. He had help come in, Monday through Friday for about 3 hours. He was not always hydrating well enough and creatinine was fluctuating between 1.4 and 1.8 generally speaking. He had nonspecific complaints of not feeling well on most days per daughter. He had been tried on antidepressant medication the past but did not do well on the medication according to the daughter. His MMSE was 27/30, CDT: 4/4, AFT: 12/min at the last visit and I suggested he continue with his dual medication regimen.   Today, 11/14/2015: He reports feeling about the same. He has bilateral foot pain. He is in the process of transitioning to [facility], assisted living. He is wondering whether he continues to need to come to the appointments. Coralyn Mark comes Monday through Friday, for about 11 hours, he has someone stay overnight and on weekends he has other caretakers as well. In the interim, he was seen in the emergency room after a fall. I reviewed the emergency room records. Unfortunately, his prostate cancer progressed despite therapy. He is receiving supportive  care and hospice is involved. I reviewed his PCP visit note from 10/09/15.   Previously:      I saw him on 10/17/2014, at which time he reported that he was driving locally within 5-7 mild radius, no nighttime driving, no highway driving. He had an aide come in 3 days a week on Mondays, Wednesdays, and Fridays and she would help him with his chores for about 3 hours each time. He was using a cane. He had no recent falls. He has had recurrence of his prostate cancer and was seeing his cancer doctor regularly. He opted not to do chemotherapy and was in the process of being evaluated for a new treatment option. He admitted that he was not drinking enough water. Memory was showing a slow decline per daughter. He was tolerating his medications. His MMSE was 27/30, CDT 4/4, AFT 9/min, GDS 2/15 at the time. I suggested we continue his medications.  I saw him on 04/15/2014, at which time his MMSE was 25, and he was on long-acting Namenda 28 mg and Aricept 10 mg and I suggested we continue with the current treatment. He had been hospitalized [place] the [place] and he had presented with weakness and confusion. He was found to have low sodium levels which was attributed to medication.   I saw him on 10/14/2013, at which time he reported feeling stable for the most part. He had not fallen recently. He had trouble recalling names and dates. He was on [place] and Lupron for his prostate cancer at the time and he had a bone scan  pending.    He lives alone, his wife died almost 3 years ago. He has 4 children, 3 are local and he gets to see them weekly. He has been driving, but not as good, local places, some after dark, stays in [facility].  I first met him on 04/15/2013, at which time I felt that his exam was in keeping with mild cognitive impairment or mild vascular dementia without behavioral disturbance and I felt that he was clinically stable. His MMSE scores, physical exam, clock  drawing and animal fluency scores were stable and I suggested a routine six-month followup and no changes in his medications.   He previously followed with Dr. Morene Antu and was last seen by him on 09/10/2012, at which time Dr. Erling Cruz felt he was stable and he did not make any changes to his medication regimen. He has a complex underlying medical history as above. He has received Lupron therapy for his prostate cancer, as well as radiation therapy and his gastric tumor was diagnosed by biopsy in August 2012, s/p surgery in 3/14. He is on medications for his cancer. He broke his R arm at age 62 and has loss of ROM d/t that.   He has had MCI, and was initially evaluated on 02/24/1997 at the request of Dr. Romeo Apple for memory loss, which began after surgery with coronary artery bypass on 12/02/1996. Evaluation included B12 level, RPR, neuropsychological battery, and MRI study study of the brain 02/26/1997. Doppler study of the carotids showed 50-69% right ECA stenosis. He has short-term memory loss which has been stable. He has been on Aricept for over 15 years. He worked as a Chief Executive Officer. He drives a car and is independent in ADLs. He has had daytime sleepiness. In January 2012 he developed severe rectal pain which progressed to go into his left leg posteriorly to behind the knee and into the left foot. It can go into his scrotum and into his penis. MRI of the lumbar spine without contrast on 04/09/10, 09/07/10, and 01/07/11 showed mild DJD. MRI of the pelvis without contrast on 05/07/10 showed rectal wall thickening and no pelvic adenopathy. There was mild diffuse bladder wall thickness. 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 Bone scan 02/07/2011 showed possible metastatic process at T9. He has had bowel and bladder accidents. On 03/08/11, his MMSE was 29/30. He has left foot numbness on the bottom of his foot. His  pain is 0-8/10. He has discontinued a fentanyl patch and Nucynta. In 08/2011 he developed pain in his right side including the buttocks extending to the bottom of his right foot with numbness. He had an epidural injection at L5-S1 for his severe pain with improvement in symptoms. His pain is made worse by bending and stooping. MRI of the lumbar spine on 10/05/2011 showed a focal disc protrusion at L5-S1 to the right of midline and small sclerotic metastasis to the the posterior aspect of the left iliac bone.    His Past Medical History Is Significant For: Past Medical History  Diagnosis Date  . Hypertension   . Hyperlipidemia   . Memory loss     mild  . Rectal ulcer     RESOLVED  . Constipation   . History of ventricular fibrillation 1998    while on treadmill  . Hypothyroidism   . IBS (irritable bowel syndrome)   .  Colitis     RESOLVED  . ED (erectile dysfunction)   . Osteoporosis   . Depression   . Diabetes mellitus     fasting blood sugar 140-200 usually  . GERD (gastroesophageal reflux disease)   . Heart attack ([place]) 2009  . Dementia   . Neuromuscular disorder ([place])     DIABETIC NEUROPATHY  . Prostate cancer ([place]) 2007  . Pain     LOWER BACK  -- PAST HX OF STEROID EPIDURALS FOR PAIN RELIEF  . 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 CAD (coronary artery disease)     a. S/P CABG x 5 1998; b.  S/P stent LCX 2009;  c.  LHC 5/09: RCA proximal 90%, 95%, distally occluded, pCFX stents patent, OM1 30%, small left atrial branch 95%, oLAD occluded, S-dRCA patent, L-LAD/Dx patent, EF 60%=> Med Rx  ;  d.  Lex MV 3/13:  EF 63%, no ischemia  e.  Lex Myoview 03/08/13:  EF 56%, inf and apical defect suggestive of thinning and soft tissue attenuation, no ischemia; low risk.    . Bone metastases ([place])   . S/P radiation therapy 07/17/2005 - 09/10/2005    Prostate  7800 cGy, 40 Sessions  . Cataract     bil eyes  . Renal disorder     His Past Surgical History Is Significant For: Past Surgical History  Procedure Laterality Date  . Coronary artery bypass graft  1998    x5, with a left internal mammary to the LAD and diagonal, vein graft to the OM1 and 2, and a vein graft to the right coronary artery  . Coronary stent placement  11/13/2007    LCX  . Trigger finger repaired  2009  . Laparoscopic gastrectomy N/A 08/18/2012    Procedure: Laparoscopic Partial Gastrectomy;  Surgeon: [name], MD;  Location: [facility] ORS;  Service: General;  Laterality: N/A;  Laparoscopic Partial Gastrectomy,  . Colonoscopy    . Stomach surgery    . Upper gastrointestinal endoscopy      His Family History Is Significant For: Family History  Problem Relation Age of Onset  . Stroke Father   . Pneumonia Father   . Colon cancer Neg Hx   . Esophageal cancer Neg Hx   . Rectal cancer Neg Hx   . Stomach cancer Neg Hx     His Social History Is Significant For: Social History   Social History  . Marital Status: Widowed    Spouse Name: N/A  . Number of Children: 4  . Years of Education: N/A   Occupational History  . retired    Social History Main Topics  . Smoking status: Former Research scientist (life sciences)  . Smokeless tobacco: Former Systems developer    Quit date: 02/11/1948  . Alcohol Use: No  . Drug Use: No  . Sexual Activity: Not Asked   Other Topics Concern  . None   Social History Narrative   Right handed, Widowed, 4 kids, Caffeine 2 cups daily, occa soda.  Retired, Chief Executive Officer    His Allergies Are:  Allergies  Allergen Reactions  . Codeine Phosphate Other (See Comments)    mouth sores  :   His Current Medications Are:  Outpatient Encounter Prescriptions as of 11/14/2015  Medication Sig  . ACCU-CHEK AVIVA PLUS test strip TEST BLOOD SUGAR ONE TO TWO TIMES DAILY  . acetaminophen (TYLENOL) 500 MG tablet Take  1,000 mg by mouth every 6 (six) hours as needed for pain.  [name] amLODipine  (NORVASC) 10 MG tablet TAKE 1 TABLET(10 MG) BY MOUTH DAILY  . aspirin EC 81 MG tablet Take 81 mg by mouth every morning.   [name] atorvastatin (LIPITOR) 10 MG tablet TAKE 1 TABLET BY MOUTH EVERY DAY  . calcium carbonate-magnesium hydroxide (ROLAIDS) 334 MG CHEW Chew 1 tablet by mouth 3 (three) times daily as needed (indigestion/heartburn).   . Calcium Citrate-Vitamin D (CITRACAL + D PO) Take 1 tablet by mouth daily.  [name] donepezil (ARICEPT) 10 MG tablet TAKE 1 TABLET BY MOUTH DAILY  . Insulin Glargine (LANTUS SOLOSTAR) 100 UNIT/ML Solostar Pen 26 units at bedtime  . levothyroxine (SYNTHROID, LEVOTHROID) 50 MCG tablet TAKE 1 TABLET BY MOUTH DAILY  . NAMENDA XR 28 MG CP24 24 hr capsule TAKE 1 CAPSULE BY MOUTH DAILY  . nitroGLYCERIN (NITROSTAT) 0.4 MG SL tablet Place 0.4 mg under the tongue every 5 (five) minutes as needed. Reported on 07/14/2015  . Nutritional Supplements (JUICE PLUS FIBRE PO) Take 6 capsules by mouth daily.   [name] oxycodone (OXY-IR) 5 MG capsule Take 5 mg by mouth every 4 (four) hours as needed.  . pregabalin (LYRICA) 100 MG capsule Take 100 mg by mouth 2 (two) times daily.  . VESICARE 10 MG tablet Take 10 mg by mouth daily.   . [DISCONTINUED] LYRICA 50 MG capsule TAKE 2 CAPSULES BY MOUTH EVERY MORNING AND 1 CAPSULE EVERY EVENING (Patient not taking: Reported on 10/17/2015)  . [DISCONTINUED] traMADol (ULTRAM) 50 MG tablet Take 1 tablet (50 mg total) by mouth every 8 (eight) hours as needed.   No facility-administered encounter medications on file as of 11/14/2015.  :  Review of Systems:  Out of a complete 14 point review of systems, all are reviewed and negative with the exception of these symptoms as listed below:   Review of Systems  Neurological: Positive for weakness.       Patient states that he has no new concerns. He asks if he needs to still come here.     Objective:  Neurologic Exam  Physical Exam Physical Examination:   Filed Vitals:   11/14/15 1408  BP: 132/60   Pulse: 70  Resp: 16   General Examination: The patient is a very pleasant 82 y.o. male in no acute distress. He appears more frail and deconditioned.   HEENT: Normocephalic, atraumatic, pupils are equal, round and reactive to light and accommodation. He has cataracts, and R ptosis (this since birth, unchanged from before). Extraocular tracking shows mild saccadic breakdown without nystagmus noted. Hearing is impaired. Face is symmetric with no facial masking and normal facial sensation. There is no lip, neck or jaw tremor. Neck is not rigid with intact passive ROM. There are no carotid bruits on auscultation. Oropharynx exam reveals mild mouth dryness. No significant airway crowding is noted. Mallampati is class II. Tongue protrudes centrally and palate elevates symmetrically.    Chest: is clear to auscultation without wheezing, rhonchi or crackles noted.  Heart: sounds are regular and normal without murmurs, rubs or gallops noted.   Abdomen: is soft, non-tender and non-distended with normal bowel sounds appreciated on auscultation.  Extremities: There is trace edema in the ankles bilaterally.   Skin: is warm and dry with no trophic changes noted. Age-related changes are noted on the skin. Multiple small bruises of varying ages are noted on the forearms. Of note, He is on ASA.  Musculoskeletal: exam reveals R elbow  joint deformity (since 7th grade), and tenderness and mild joint swelling in the R knee and L knee with more L knee pain. No erythema.   Neurologically:   Mental status: The patient is awake and alert, paying good attention. He is able to partially provide the history. He is oriented to: person, place, time/date, situation, day of week, month of year and year. His memory, attention, language and knowledge are impaired. There is no aphasia, agnosia, apraxia or anomia. There is a no significant degree of bradyphrenia. Speech is mildly hypophonic with no dysarthria noted. Mood is  congruent and affect is fairly normal today.   On 04/15/13: His MMSE (Mini-Mental state exam) score was 30/30. CDT (Clock Drawing Test) score was 4/4. AFT (Animal Fluency Test) score was 4.    On 04/15/2014: MMSE was 25/30, CDT was 4/4, AFT was 10.   On 10/17/2014: MMSE: 27/30, CDT 4/4, AFT 9/min, GDS 2 or 3/15.  On 05/08/2015: MMSE: 27/30, CDT: 4/4, AFT: 12/min.  On 11/14/2015: MMSE: 21/30, CDT: 4/4, AFT: 11/min.  Cranial nerves are as described above under HEENT exam. In addition, shoulder shrug is normal with equal shoulder height noted.  Motor exam: Decreased bulk global strength of 4 out of 5 is noted. He appears deconditioned. He has no tremor. Romberg is not testable because of safety issues. Reflexes are 1+ in the upper extremities and absent in the lower extremities. Fine motor skills are globally mildly impaired.   Sensory exam is impaired in the distal lower extremities up to the knees essentially, he also reports pain in both feet.  Gait, station and balance: He stands up from the seated position with mild to moderate difficulty and needs to push himself up. He does not seem to straighten out his knees. He does not pick up his feet very well. He walks slowly with a 2 wheeled walker. Tandem walk is not possible, balance is impaired.   Assessment and Plan:   In summary, Diego L Cutsforth is a very pleasant 82 year old male with a complex underlying medical history of diabetes, metastatic prostate cancer with evidence of progressive disease (hospice involved), stomach tumor, osteoporosis, heart disease, hyperlipidemia, thyroid disease and hypertension, status post three-vessel CABG in 1998, MI in 2009 with stent placement, on treatment for his recurrent prostate cancer with bony metastases, s/p radiation therapy, and s/p 6 radium infusions, who presents for followup consultation of his memory loss, dementia without behavioral disturbance, with first factors for vascular dementia. He has had  physical decline and memory scores have declined. He continues to be on the maximum dose of Namenda XR 28 mg once daily and has been on 10 mg of generic Aricept for over 15 years. I suggested we continue with the current medications. He is asking whether he still needs to keep coming for appointments and given, that he has been able to tolerate the medications, and it is getting harder for him to make it to his appointments, I suggested an as needed follow up. He has evidence of neuropathy, which is stable.  He is in the process of transitioning to assisted living at [facility], I think this is a good idea. He has seen the hospice physician at the house, Dr. Tomasa Hosteller once. He has an appointment with his oncologist later this month. I encouraged the patient to eat healthy and drink plenty of water, use his walker at all times for safety. He has 4 children, his daughter lives about 15 minutes away and his son  lives in [place].  I answered all his questions today and the patient was in agreement. I spent 25 minutes in total face-to-face time with the patient, more than 50% of which was spent in counseling and coordination of care, reviewing test results, reviewing medication and discussing or reviewing the diagnosis of dementia, its prognosis and treatment options.

## 2015-11-14 NOTE — Patient Instructions (Signed)
We can continue with your 2 memory medications, which are stable at the current doses.  As discussed, I can see you back as needed.  Good luck with your transition to [facility].

## 2015-11-15 ENCOUNTER — Other Ambulatory Visit: Payer: Self-pay | Admitting: *Deleted

## 2015-11-15 LAB — TB SKIN TEST
Induration: 0 mm
TB SKIN TEST: NEGATIVE

## 2015-11-15 MED ORDER — ZOSTER VACCINE LIVE 19400 UNT/0.65ML ~~LOC~~ SUSR: 0.6500 mL | Freq: Once | SUBCUTANEOUS | Status: AC

## 2015-11-22 ENCOUNTER — Telehealth: Payer: Self-pay | Admitting: Internal Medicine

## 2015-11-22 NOTE — Telephone Encounter (Signed)
Fax number [phone]

## 2015-11-22 NOTE — Telephone Encounter (Signed)
Pt is going to assistant living [facility] and needs order to keep Rolaids in his room to self administer. Pt daughter will call back with fax #

## 2015-11-23 ENCOUNTER — Telehealth: Payer: Self-pay | Admitting: Internal Medicine

## 2015-11-23 NOTE — Telephone Encounter (Signed)
Spoke to Nora, told her I will send order over tomorrow for Rolaids. Dr. Raliegh Ip is not here today. Also hospice called for other medication orders, clarified medications with [name] and told her will send all over tomorrow. [name] verbalized understanding.

## 2015-11-23 NOTE — Telephone Encounter (Signed)
Tonya from [facility] [phone]) wanted to let you know that the patient will be transferring [name] [name] on May 27 th, 2017 and wanted to see if Dr. Raliegh Ip will write the patients Rx and fax them to [facility] or fax to [place].

## 2015-11-23 NOTE — Telephone Encounter (Signed)
Spoke to [name] [name], told her Dr.K is not here today but will print Rx's tomorrow and fax over to you, just need fax number. Tonya verbalized understanding and fax # is [phone].

## 2015-11-24 ENCOUNTER — Telehealth: Payer: Self-pay | Admitting: Oncology

## 2015-11-24 ENCOUNTER — Ambulatory Visit (HOSPITAL_BASED_OUTPATIENT_CLINIC_OR_DEPARTMENT_OTHER): Admitting: Oncology

## 2015-11-24 ENCOUNTER — Other Ambulatory Visit (HOSPITAL_BASED_OUTPATIENT_CLINIC_OR_DEPARTMENT_OTHER)

## 2015-11-24 VITALS — BP 157/62 | HR 62 | Temp 98.2°F | Resp 17 | Ht 67.0 in | Wt 192.2 lb

## 2015-11-24 DIAGNOSIS — C7951 Secondary malignant neoplasm of bone: Secondary | ICD-10-CM | POA: Diagnosis not present

## 2015-11-24 DIAGNOSIS — E291 Testicular hypofunction: Secondary | ICD-10-CM | POA: Diagnosis not present

## 2015-11-24 DIAGNOSIS — C61 Malignant neoplasm of prostate: Secondary | ICD-10-CM | POA: Diagnosis not present

## 2015-11-24 LAB — CBC WITH DIFFERENTIAL/PLATELET
BASO%: 0.6 % (ref 0.0–2.0)
BASOS ABS: 0 10*3/uL (ref 0.0–0.1)
EOS ABS: 0.1 10*3/uL (ref 0.0–0.5)
EOS%: 3 % (ref 0.0–7.0)
HEMATOCRIT: 29.1 % — AB (ref 38.4–49.9)
HEMOGLOBIN: 9.5 g/dL — AB (ref 13.0–17.1)
LYMPH#: 0.7 10*3/uL — AB (ref 0.9–3.3)
LYMPH%: 17.3 % (ref 14.0–49.0)
MCH: 28.2 pg (ref 27.2–33.4)
MCHC: 32.5 g/dL (ref 32.0–36.0)
MCV: 86.9 fL (ref 79.3–98.0)
MONO#: 0.4 10*3/uL (ref 0.1–0.9)
MONO%: 8.5 % (ref 0.0–14.0)
NEUT%: 70.6 % (ref 39.0–75.0)
NEUTROS ABS: 2.9 10*3/uL (ref 1.5–6.5)
Platelets: 152 10*3/uL (ref 140–400)
RBC: 3.35 10*6/uL — ABNORMAL LOW (ref 4.20–5.82)
RDW: 16.6 % — AB (ref 11.0–14.6)
WBC: 4.2 10*3/uL (ref 4.0–10.3)

## 2015-11-24 LAB — COMPREHENSIVE METABOLIC PANEL
ALBUMIN: 2.9 g/dL — AB (ref 3.5–5.0)
ALK PHOS: 214 U/L — AB (ref 40–150)
ALT: 15 U/L (ref 0–55)
AST: 26 U/L (ref 5–34)
Anion Gap: 9 mEq/L (ref 3–11)
BILIRUBIN TOTAL: 0.32 mg/dL (ref 0.20–1.20)
BUN: 27.8 mg/dL — AB (ref 7.0–26.0)
CO2: 20 mEq/L — ABNORMAL LOW (ref 22–29)
CREATININE: 3.1 mg/dL — AB (ref 0.7–1.3)
Calcium: 8.7 mg/dL (ref 8.4–10.4)
Chloride: 109 mEq/L (ref 98–109)
EGFR: 18 mL/min/{1.73_m2} — ABNORMAL LOW (ref >90)
Glucose: 319 mg/dl — ABNORMAL HIGH (ref 70–140)
POTASSIUM: 4.5 meq/L (ref 3.5–5.1)
Sodium: 138 mEq/L (ref 136–145)
TOTAL PROTEIN: 6.8 g/dL (ref 6.4–8.3)

## 2015-11-24 MED ORDER — VESICARE 10 MG PO TABS: 10.0000 mg | ORAL_TABLET | Freq: Every day | ORAL | Status: AC

## 2015-11-24 MED ORDER — AMLODIPINE BESYLATE 10 MG PO TABS: ORAL_TABLET | ORAL | Status: AC

## 2015-11-24 MED ORDER — DONEPEZIL HCL 10 MG PO TABS: 10.0000 mg | ORAL_TABLET | Freq: Every day | ORAL | Status: AC

## 2015-11-24 MED ORDER — PREGABALIN 100 MG PO CAPS: 100.0000 mg | ORAL_CAPSULE | Freq: Two times a day (BID) | ORAL | Status: AC

## 2015-11-24 MED ORDER — ACETAMINOPHEN 500 MG PO TABS: 1000.0000 mg | ORAL_TABLET | Freq: Four times a day (QID) | ORAL | Status: AC | PRN

## 2015-11-24 MED ORDER — LEVOTHYROXINE SODIUM 50 MCG PO TABS: 50.0000 ug | ORAL_TABLET | Freq: Every day | ORAL | Status: AC

## 2015-11-24 MED ORDER — NITROGLYCERIN 0.4 MG SL SUBL: 0.4000 mg | SUBLINGUAL_TABLET | SUBLINGUAL | Status: AC | PRN

## 2015-11-24 MED ORDER — MEMANTINE HCL ER 28 MG PO CP24: 28.0000 mg | ORAL_CAPSULE | Freq: Every day | ORAL | Status: AC

## 2015-11-24 MED ORDER — ATORVASTATIN CALCIUM 10 MG PO TABS: 10.0000 mg | ORAL_TABLET | Freq: Every day | ORAL | Status: AC

## 2015-11-24 MED ORDER — INSULIN GLARGINE 100 UNIT/ML SOLOSTAR PEN: PEN_INJECTOR | SUBCUTANEOUS | Status: AC

## 2015-11-24 NOTE — Telephone Encounter (Signed)
Order written and faxed to Hospice.

## 2015-11-24 NOTE — Telephone Encounter (Signed)
Rx's printed and signed by Dr.K and faxed to [facility] at [place] [phone].

## 2015-11-24 NOTE — Progress Notes (Signed)
Hematology and Oncology Follow Up Visit  [name] [name] PT:[id number] 11-Dec-1929 82 y.o. 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 His initial diagnosis was in 2006 when he presented with a PSA of 6.26 the Gleason score 4+3 equals 7.  Prior Therapy:  He is status post definitive therapy with radiation and androgen deprivation completed in 2007. The patient developed recurrent disease in 2012 with a PSA rise up to 28 and T9 osseous metastasis and was treated with combined androgen deprivation with Lupron and Casodex. He had an excellent response with his PSA nadir down to 0.19. Most recently he developed a rise in his PSA up to 3.06 in July 2014 despite castrate levels of testosterone. His bone scan in July 2014 confirmed the presence of bony disease. Zytiga 1000 mg daily started in July 2014 till July 2015.  Xtandi 160 mg daily started in July of 2015 to April 2016. Xofigo started on 11/03/2014. He is status post 6 injections completed in October 2016.  Current therapy:  He receives [name] at [name]. He receives Lupron every 4 months at [name]. He will be on supportive care only not a candidate for any further therapy.  Interim History:  [name] returns today for a followup visit with his son and daughter. Since the last visit, he reports continuous decline in his overall health. He had a fall and was seen in the emergency department on 10/17/2015. He did not sustain any major injuries but have been reported recurrent falls and decrease in his ability to care for himself. He was also had an incident where he fell and his family was present in the house and did not call for help. He is currently in the process of moving into assisted living facility for senior care.   He continues to have memory issues and at times  mental status changes associated with his progressive dementia. He does report occasional shoulder pain and currently takes oxycodone. His appetite remains reasonable although he lost weight.  He does not report any headaches, blurry vision, syncope or seizures. Does not report any fevers, chills, sweats. Does not report any chest pain, palpitation orthopnea. He does not report any lower extremity edema or difficulty breathing. No abdominal pain, nausea, vomiting. Has not reported any of the fluid retention or any bone pain. Did not report any genitourinary bleeding or hemoptysis. Remainder of his review of systems unremarkable.  Medications: I have reviewed the patient's current medications.  Current Outpatient Prescriptions  Medication Sig Dispense Refill  . ACCU-CHEK AVIVA PLUS test strip TEST BLOOD SUGAR ONE TO TWO TIMES DAILY 300 each 5  . acetaminophen (TYLENOL) 500 MG tablet Take 2 tablets (1,000 mg total) by mouth every 6 (six) hours as needed. 30 tablet 12  . amLODipine (NORVASC) 10 MG tablet TAKE 1 TABLET(10 MG) BY MOUTH DAILY 90 tablet 3  . aspirin EC 81 MG tablet Take 81 mg by mouth every morning.     [name] atorvastatin (LIPITOR) 10 MG tablet Take 1 tablet (10 mg total) by mouth daily. 90 tablet 3  . calcium carbonate-magnesium hydroxide (ROLAIDS) 334 MG CHEW Chew 1 tablet by mouth 3 (three) times daily as needed (indigestion/heartburn).     . Calcium Citrate-Vitamin D (CITRACAL + D PO) Take 1 tablet by mouth daily.    [name] donepezil (ARICEPT) 10 MG tablet Take 1 tablet (10  mg total) by mouth daily. 90 tablet 3  . Insulin Glargine (LANTUS SOLOSTAR) 100 UNIT/ML Solostar Pen 26 units at bedtime 10 pen 3  . levothyroxine (SYNTHROID, LEVOTHROID) 50 MCG tablet Take 1 tablet (50 mcg total) by mouth daily. 90 tablet 3  . memantine (NAMENDA XR) 28 MG CP24 24 hr capsule Take 1 capsule (28 mg total) by mouth daily. 30 capsule 5  . nitroGLYCERIN (NITROSTAT) 0.4 MG SL tablet Place 1 tablet (0.4 mg total)  under the tongue every 5 (five) minutes as needed. Reported on 07/14/2015 20 tablet 5  . Nutritional Supplements (JUICE PLUS FIBRE PO) Take 6 capsules by mouth daily.     [name] oxycodone (OXY-IR) 5 MG capsule Take 5 mg by mouth every 4 (four) hours as needed.    . pregabalin (LYRICA) 100 MG capsule Take 1 capsule (100 mg total) by mouth 2 (two) times daily. 180 capsule 3  . VESICARE 10 MG tablet Take 1 tablet (10 mg total) by mouth daily. 30 tablet 12  . Zoster Vaccine Live, PF, (ZOSTAVAX) 16109 UNT/0.65ML injection Inject 19,400 Units into the skin once. 1 each 0  . [DISCONTINUED] chlorthalidone (HYGROTON) 25 MG tablet Take 12.5 mg by mouth daily.     No current facility-administered medications for this visit.     Allergies:  Allergies  Allergen Reactions  . Codeine Phosphate Other (See Comments)    mouth sores    Past Medical History, Surgical history, Social history, and Family History were reviewed and updated.    Physical Exam: Blood pressure 157/62, pulse 62, temperature 98.2 F (36.8 C), temperature source Oral, resp. rate 17, height 5\' 7"  (1.702 m), weight 192 lb 3.2 oz (87.181 kg), SpO2 100 %. ECOG: 1 General appearance: Elderly gentleman without distress. Head: Normocephalic, without obvious abnormality no oral thrush noted. Neck: no adenopathy Lymph nodes: Cervical, supraclavicular, and axillary nodes normal. Heart:regular rate and rhythm, S1, S2 normal, no murmur, click, rub or gallop  Lung:chest clear, no wheezing, rales.  Abdomen: soft, non-tender, without masses or organomegaly. No shifting dullness or ascites. EXT:no erythema, induration, or nodules.  Neurological examination: No deficits.   Lab Results: Lab Results  Component Value Date   WBC 4.2 11/24/2015   HGB 9.5* 11/24/2015   HCT 29.1* 11/24/2015   MCV 86.9 11/24/2015   PLT 152 11/24/2015     Chemistry      Component Value Date/Time   NA 134* 10/17/2015 2030   NA 142 09/22/2015 1502   K 4.1  10/17/2015 2030   K 4.6 09/22/2015 1502   CL 107 10/17/2015 2030   CO2 19* 10/17/2015 2030   CO2 19* 09/22/2015 1502   BUN 33* 10/17/2015 2030   BUN 35.8* 09/22/2015 1502   CREATININE 2.88* 10/17/2015 2030   CREATININE 3.1* 09/22/2015 1502   CREATININE 1.38* 02/11/2012 1614      Component Value Date/Time   CALCIUM 8.5* 10/17/2015 2030   CALCIUM 8.3* 09/22/2015 1502   ALKPHOS 161* 09/22/2015 1502   ALKPHOS 177* 07/14/2015 2058   AST 16 09/22/2015 1502   AST 21 07/14/2015 2058   ALT 11 09/22/2015 1502   ALT 18 07/14/2015 2058   BILITOT 0.34 09/22/2015 1502   BILITOT 0.9 07/14/2015 2058     Lab Results  Component Value Date   PSA 192.30* 09/22/2015   PSA 81.72* 07/13/2015   PSA 49.67* 06/14/2015     Impression and Plan: This is an 82 year old gentleman with the following issues: 1. Castration resistant prostate cancer  with disease to the bone. His initial diagnosis dates back to 2006 with PSA of 6.26 and a Gleason score of 7. He progressed on [place].   He is S/P Xofigo and have tolerated it well. He received a total 6 treatments completed in October 2016. His PSA had increased up to 49 and bone scan showed progression of disease which was discussed with the family on his previous visit.  He is currently receiving supportive care only given the progression of his disease and lack of effective options.   Since the last visit, he is experiencing further decline in his overall health and inability to care for himself. He is in the process of moving to assisted living facility which I certainly support given his overall decline. His prognosis from his prostate cancer is poor and his cancer is incurable. His decline is also contributed to by his dementia which is contributing to his inability to care for himself and to continue to have issues with falls and failure to thrive.   2. Bony disease: He is receiving Xgeva at [facility] urology on a monthly basis. I have  recommended stopping this in the near future.  3. Hormonal deprivation: He is receiving Lupron at [name] every 4 months. I have recommended stopping these in the near future as well.  4. Advanced directives and end-of-life care: This was discussed again and he and his family understand that he has limited life expectancy of less than 6 months. Living well as well as CODE STATUS have been addressed.  5. Followup: In 3 months if needed to.  Zola Button, MD 5/26/20173:44 PM

## 2015-11-24 NOTE — Telephone Encounter (Signed)
Gave pt apt & avs °

## 2015-11-28 ENCOUNTER — Telehealth: Payer: Self-pay | Admitting: Internal Medicine

## 2015-11-28 ENCOUNTER — Telehealth: Payer: Self-pay | Admitting: Neurology

## 2015-11-28 ENCOUNTER — Encounter: Payer: Self-pay | Admitting: Internal Medicine

## 2015-11-28 MED ORDER — ASPIRIN EC 81 MG PO TBEC: 81.0000 mg | DELAYED_RELEASE_TABLET | Freq: Every morning | ORAL | Status: AC

## 2015-11-28 NOTE — Telephone Encounter (Signed)
Pt's daughter called said he has moved to assisted living([facility]), he has long term care insurance. sts last appt with Dr Rexene Alberts, she indicated he should go to assisted living. Daughter said he will be checked out today at the facility to determine if he qualify's to use long term care insurance.The daughter is inquiring if it is a Dr's opinion stating he could stay at home safely? And If so she will need a letter documenting this. Daughter is aware Dr Rexene Alberts is out of the clinic until next Tuesday. She is asking for this to be taken care of this week.

## 2015-11-28 NOTE — Telephone Encounter (Signed)
Ok to write letter for family, recommending that patient go to assisted living facility per last office notes?

## 2015-11-28 NOTE — Telephone Encounter (Signed)
Called daughter back. Relayed Dr Jaynee Eagles message below. She verbalized understanding. I advised I spoke to Dr Guadelupe Sabin nurse and she will speak to Dr Rexene Alberts when she is back on Tuesday to get a letter ready for her. In the meantime, she can pick up her last office note for their records. This is very detailed. She will have to sign record release form at front desk. She verbalized understanding and will stop by office today.

## 2015-11-28 NOTE — Telephone Encounter (Addendum)
Pt needs order to take t ASA daily . Pt daughter would like to have on our letter head that her dad is not  safe at home due to mental capacity. Pt has mild memory loss

## 2015-11-28 NOTE — Telephone Encounter (Signed)
Spoke to [name], told her letter is ready for pickup, will be at the front desk.

## 2015-11-28 NOTE — Telephone Encounter (Signed)
Please see message from daughter regarding letter. Rx already done.

## 2015-11-28 NOTE — Telephone Encounter (Signed)
Letter dictated

## 2015-11-28 NOTE — Telephone Encounter (Signed)
Dr. Guadelupe Sabin last note stated that he has had physical decline and his memory scores have declined, she recommended assisted living for him. I don't think she feels he can stay at home alone. I prefer patient to wait for Dr. Rexene Alberts, Dr. Rexene Alberts will be in the office next week. We usually have 14 business days to fill out forms and letters anyway. Thanks.

## 2015-11-29 ENCOUNTER — Other Ambulatory Visit: Payer: Self-pay | Admitting: *Deleted

## 2015-12-04 NOTE — Telephone Encounter (Signed)
Please write a letter stating that I recommend patient transition to assist. Living, given his physical decline and cognitive decline to allow him to be in an environment with more closer observation, care and medication administration help.

## 2015-12-05 NOTE — Telephone Encounter (Signed)
I spoke to [name], she asked that I mail the letter to her at [place] [phone]. It will mail out today.

## 2016-01-17 ENCOUNTER — Telehealth: Payer: Self-pay | Admitting: Internal Medicine

## 2016-01-17 NOTE — Telephone Encounter (Signed)
[name] with hospice states they had a family meeting with the hospice MD and agreed on some medicine changes Starting Zoloft 25 mg /am Stopping Lipitor and calcium Pt will get 5 mg oxycodone a scheduled basis  No need to call back, this is a Pharmacist, hospital

## 2016-01-24 ENCOUNTER — Telehealth: Payer: Self-pay | Admitting: Internal Medicine

## 2016-01-24 NOTE — Telephone Encounter (Signed)
Pt has lost 5 pounds in one month and they would like to have a verbal order to give pt Glucerna 3 times a day with meals due to pts appetite has decreased.

## 2016-01-25 NOTE — Telephone Encounter (Signed)
Request from [place] at [facility].

## 2016-01-26 NOTE — Telephone Encounter (Signed)
Tried calling the number left for [name], the phone rang a few times and then a busy signal came on. Called [facility] and was told to call [facility]. Called and spoke with a med tech who wanted the orders faxed over, but was never able to speak to [name], and no one was able to tell me who she was. Orders will not be faxed since they weren't able to transfer me to [facility] or give me a nurse who could verify the patient or the orders. Will wait for a call back from [facility] or another nurse from the nursing home.

## 2016-01-26 NOTE — Telephone Encounter (Signed)
Okay for Glucerna

## 2016-02-01 NOTE — Telephone Encounter (Signed)
Pt's family following up on this request. She was able to get [name] on the phone, but [name] [name] (hospice nurse) could not hold on long enough because she was with another pt. However, [name] is going to fax me the order again, and I will bring to you.  I verified the number, she says it is her cell phone and she may have been out ot the area. But it is correct.

## 2016-02-02 NOTE — Telephone Encounter (Signed)
Papers faxed.

## 2016-02-17 ENCOUNTER — Telehealth: Payer: Self-pay | Admitting: Oncology

## 2016-02-17 NOTE — Telephone Encounter (Signed)
Called to advise 8/25 appt chg due to md pal. S/w pt's daughter [name] [name]. She says it is ok to cancel 8/25 appt but she doesn't think the appt should be r/s'd. She says her dad is in transition and hasn't woken up today at all. She said she thinks that if her dad were to come to an appt he would need to be brought by ambulance. I advised [name], I would cancel the appt and inform md.

## 2016-02-20 ENCOUNTER — Telehealth: Payer: Self-pay | Admitting: Internal Medicine

## 2016-02-20 NOTE — Telephone Encounter (Signed)
° °  [name] from Hospice call to say pt is having constipation issues and is asking for a order for SORBITOL [place]

## 2016-02-21 NOTE — Telephone Encounter (Signed)
Call in this order please for 30 days only

## 2016-02-21 NOTE — Telephone Encounter (Signed)
Left detailed message on machine for [name] at [facility] confirming the order for Sorbitol 30 milliliters by mouth at bed time. May have for 30 days per Dr. Sarajane Jews.

## 2016-02-23 ENCOUNTER — Ambulatory Visit: Admitting: Oncology

## 2016-03-01 DEATH — deceased

## 2017-02-04 ENCOUNTER — Telehealth: Payer: Self-pay | Admitting: Internal Medicine

## 2017-02-04 NOTE — Telephone Encounter (Signed)
Release received on 02/04/17

## 2017-12-18 IMAGING — CR DG SHOULDER 2+V*R*
2 series · 2 of 2 positions shown · non-contrast
Comparison: None.

CLINICAL DATA: Right shoulder pain after unwitnessed fall.

EXAM:
RIGHT SHOULDER - 2+ VIEW

[x shoulder ap right]
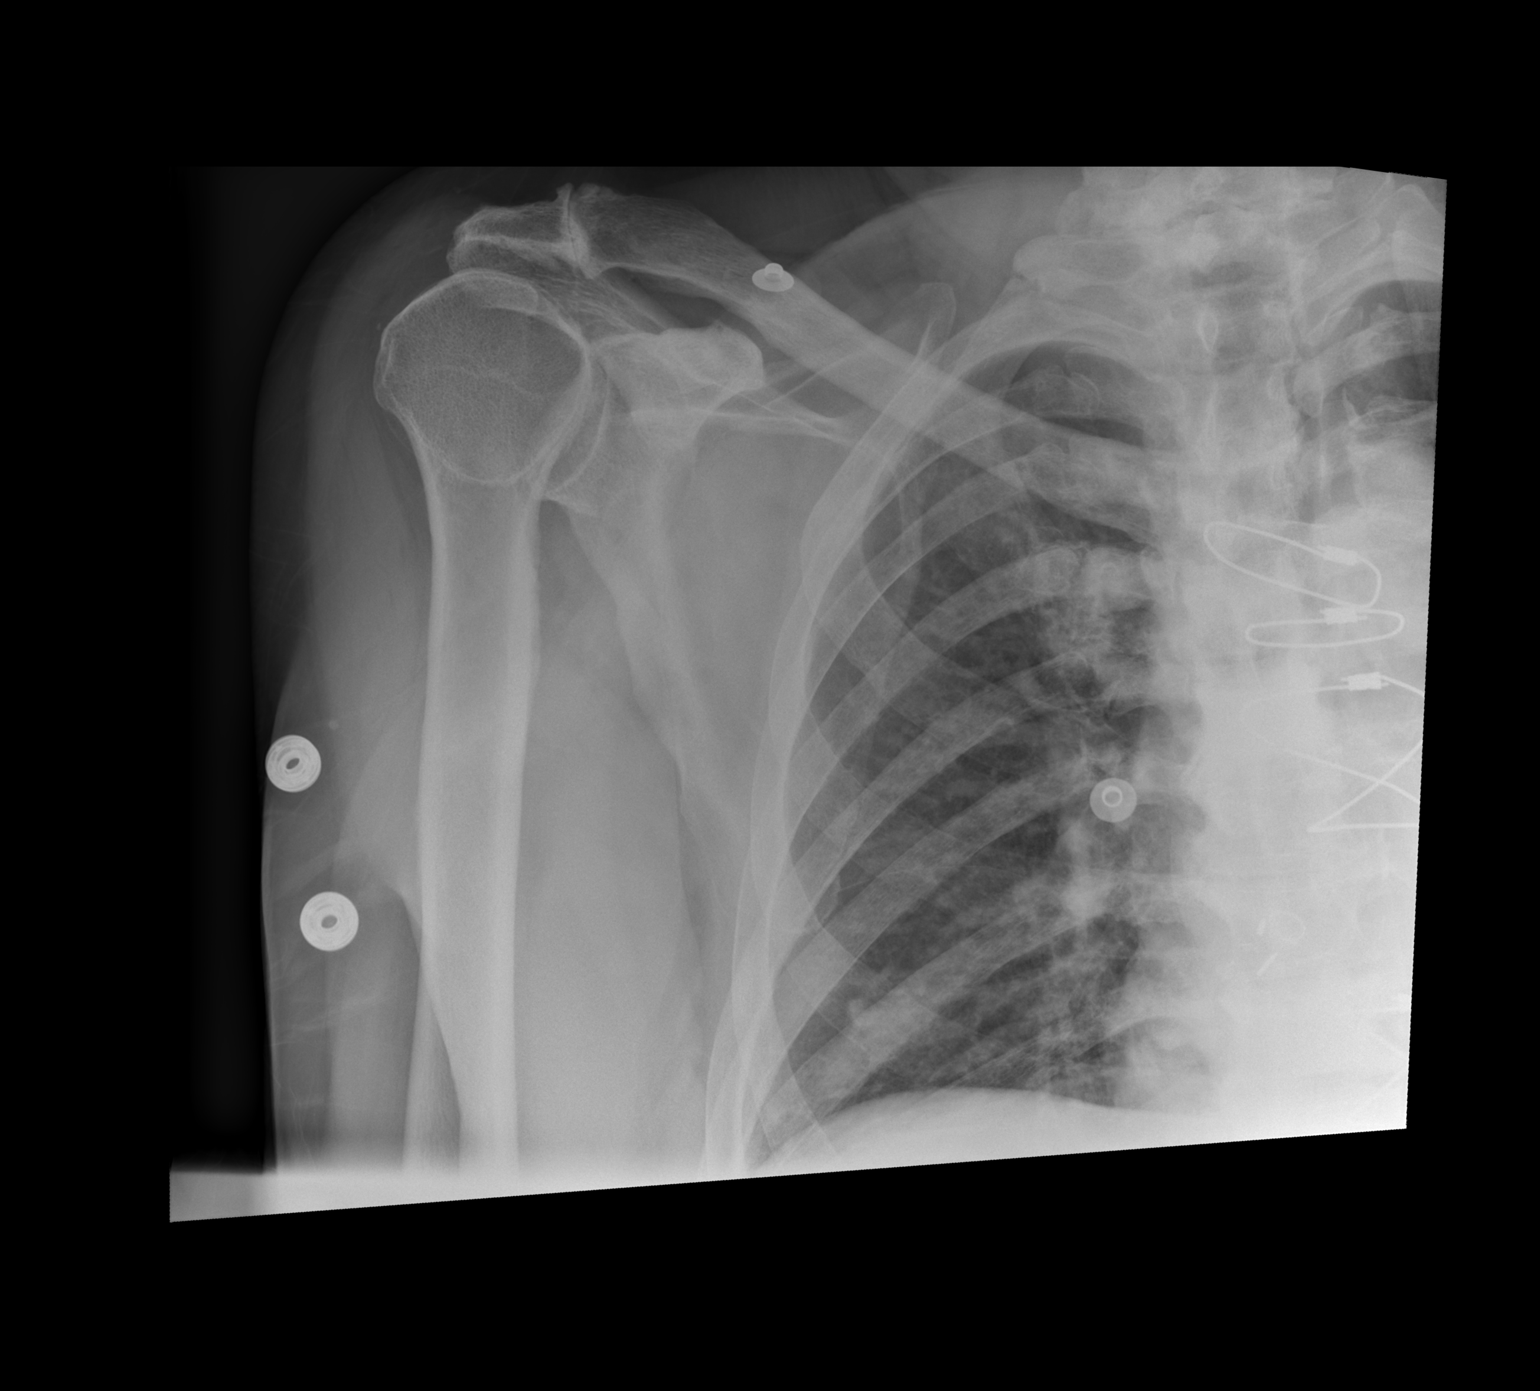

[x shoulder axillary right]
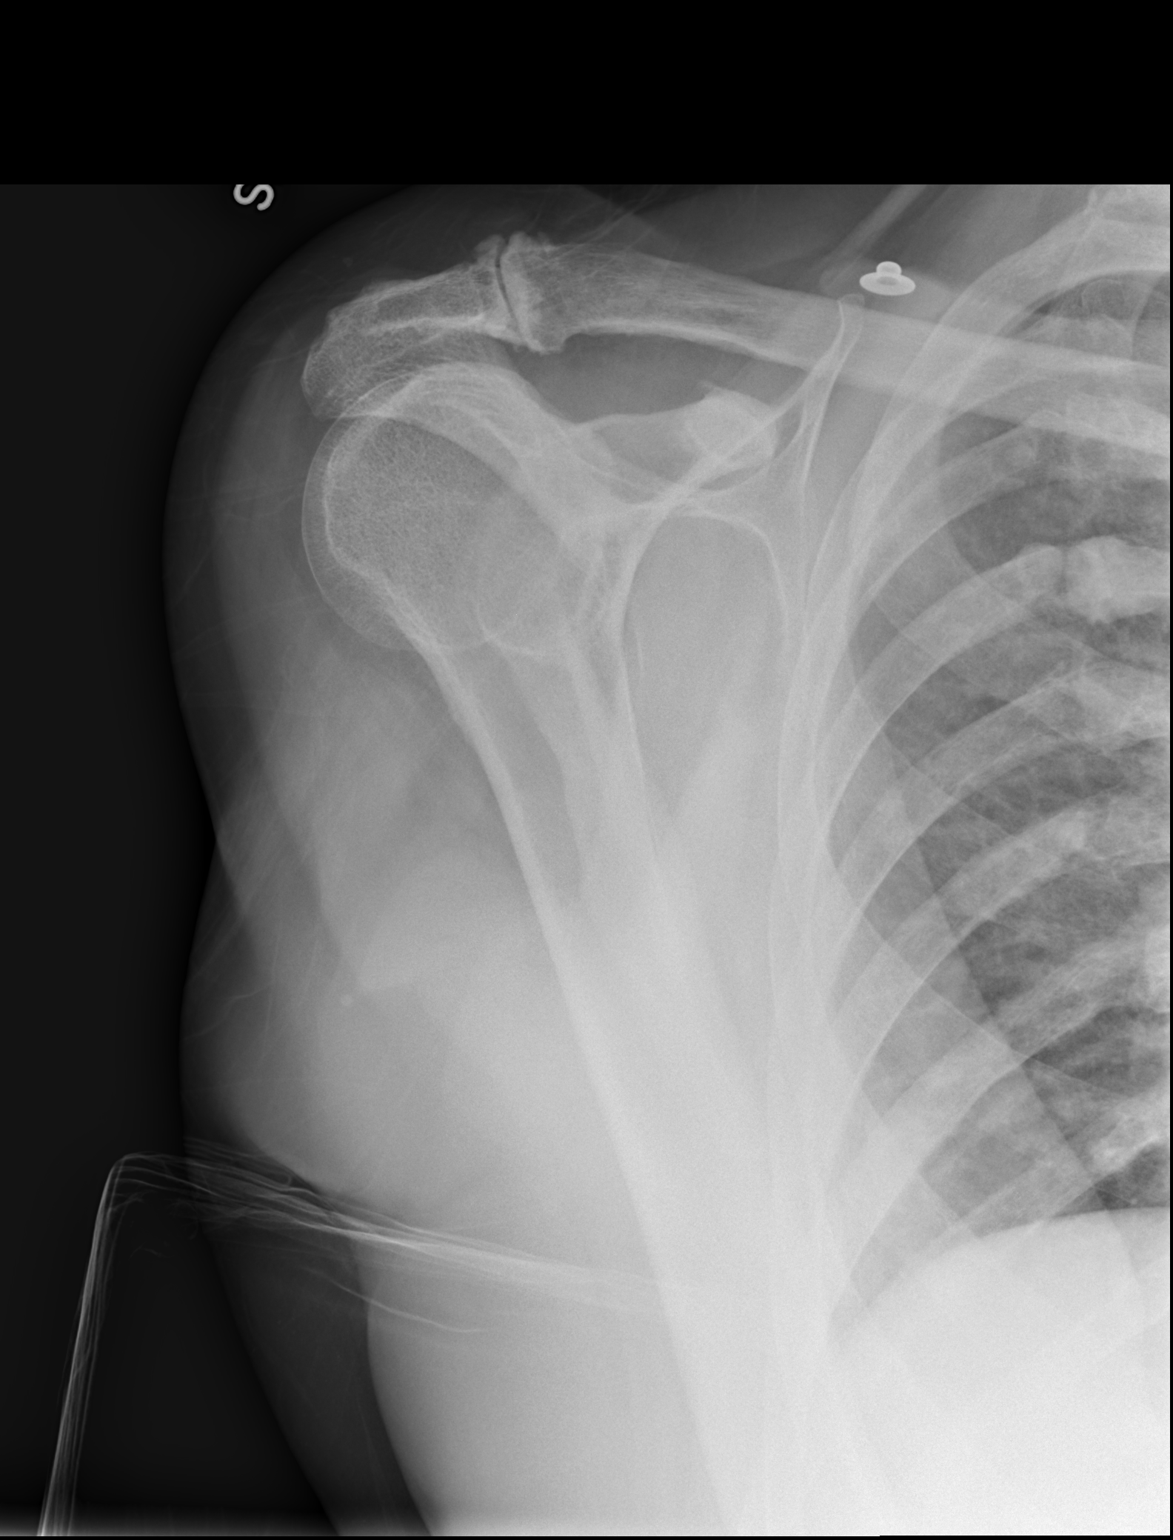

[2 of 2 positions shown; findings below may reference images not displayed]

FINDINGS: There is no evidence of fracture or dislocation. Severe degenerative
change of right acromioclavicular joint is noted. Soft tissues are
unremarkable.
IMPRESSION: Severe degenerative joint disease of right acromioclavicular joint.
No acute abnormality seen in the right shoulder.

## 2017-12-18 IMAGING — CR DG SHOULDER 2+V*L*
2 series · 2 of 2 positions shown · non-contrast
Comparison: None.

CLINICAL DATA: Left shoulder pain after unwitnessed fall.

EXAM:
LEFT SHOULDER - 2+ VIEW

[x shoulder ap left]
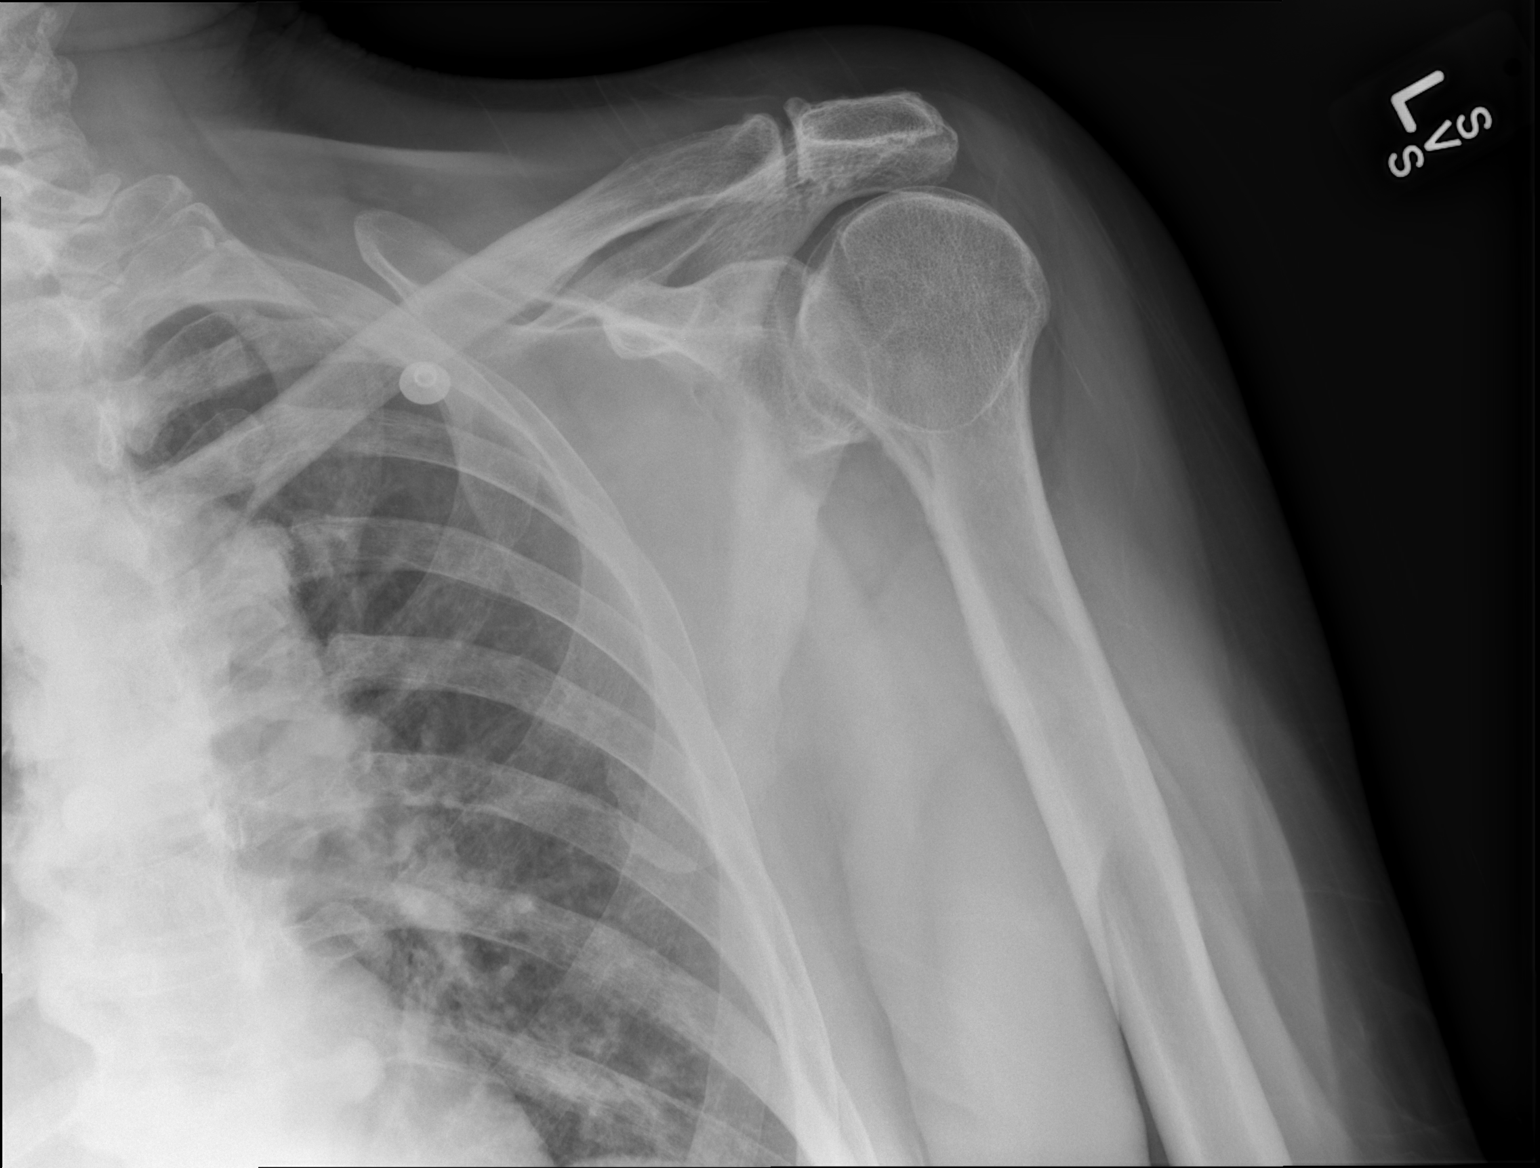

[x shoulder axillary left]
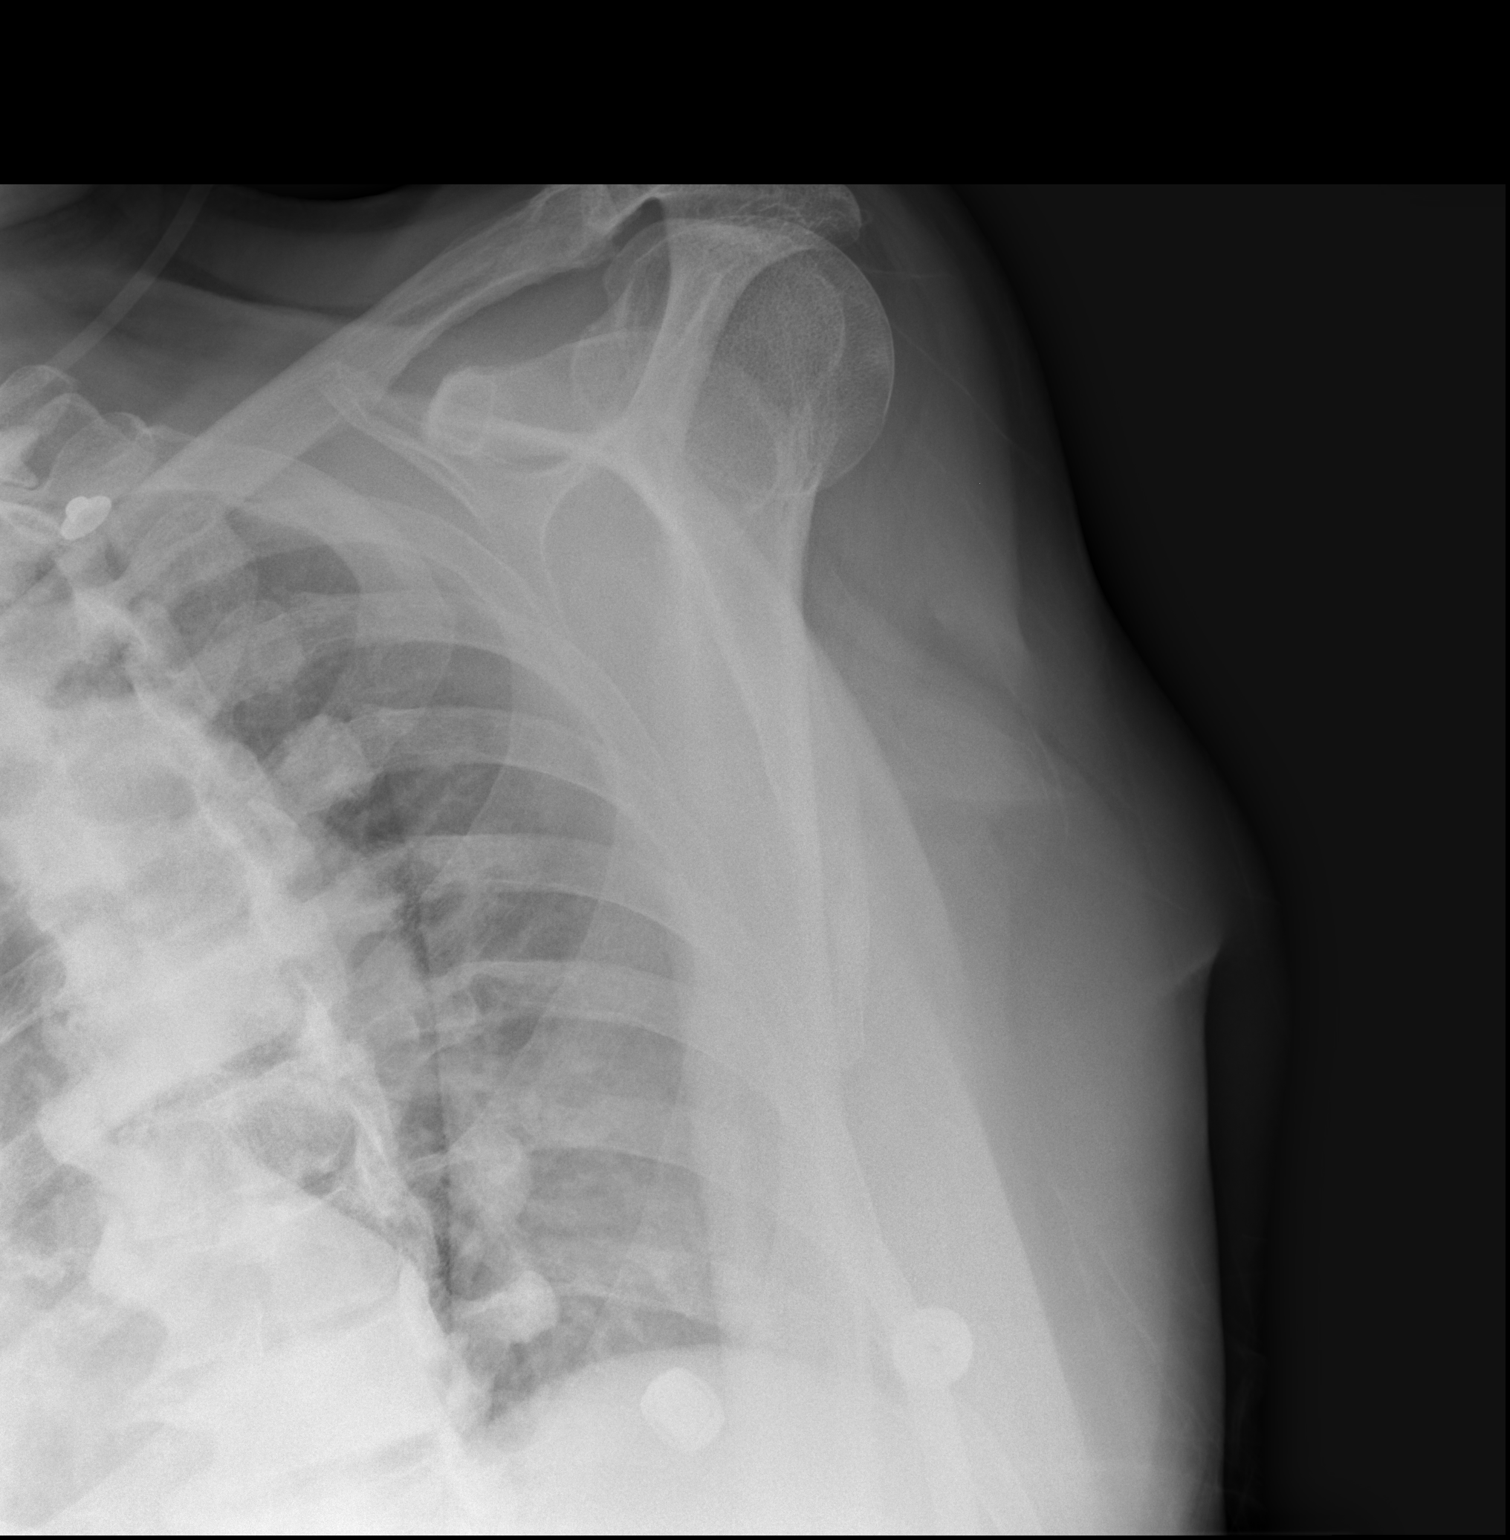

[2 of 2 positions shown; findings below may reference images not displayed]

FINDINGS: There is no evidence of fracture or dislocation. Degenerative change
of the left acromioclavicular joint is noted. Soft tissues are
unremarkable.
IMPRESSION: Degenerative joint disease of the left acromioclavicular joint. No
acute abnormality seen in the left shoulder.
